# Patient Record
Sex: Female | Born: 1950 | Race: White | Hispanic: No | Smoking: Never smoker
Health system: Southern US, Community
[De-identification: ages and names within clinical notes are randomized; demographics above are authoritative.]

## PROBLEM LIST (undated history)

## (undated) DIAGNOSIS — M1990 Unspecified osteoarthritis, unspecified site: Secondary | ICD-10-CM

## (undated) DIAGNOSIS — E785 Hyperlipidemia, unspecified: Secondary | ICD-10-CM

## (undated) DIAGNOSIS — F32A Depression, unspecified: Secondary | ICD-10-CM

## (undated) DIAGNOSIS — I10 Essential (primary) hypertension: Secondary | ICD-10-CM

## (undated) DIAGNOSIS — R0600 Dyspnea, unspecified: Secondary | ICD-10-CM

## (undated) DIAGNOSIS — R519 Headache, unspecified: Secondary | ICD-10-CM

## (undated) DIAGNOSIS — I4891 Unspecified atrial fibrillation: Secondary | ICD-10-CM

## (undated) DIAGNOSIS — F329 Major depressive disorder, single episode, unspecified: Secondary | ICD-10-CM

## (undated) DIAGNOSIS — E119 Type 2 diabetes mellitus without complications: Secondary | ICD-10-CM

## (undated) DIAGNOSIS — I519 Heart disease, unspecified: Secondary | ICD-10-CM

## (undated) HISTORY — DX: Unspecified osteoarthritis, unspecified site: M19.90

## (undated) HISTORY — DX: Essential (primary) hypertension: I10

## (undated) HISTORY — DX: Heart disease, unspecified: I51.9

## (undated) HISTORY — DX: Major depressive disorder, single episode, unspecified: F32.9

## (undated) HISTORY — DX: Depression, unspecified: F32.A

## (undated) HISTORY — DX: Hyperlipidemia, unspecified: E78.5

---

## 1998-04-29 ENCOUNTER — Other Ambulatory Visit: Admission: RE | Admit: 1998-04-29 | Discharge: 1998-04-29 | Payer: Self-pay | Admitting: Family Medicine

## 2000-05-16 ENCOUNTER — Other Ambulatory Visit: Admission: RE | Admit: 2000-05-16 | Discharge: 2000-05-16 | Payer: Self-pay | Admitting: Family Medicine

## 2001-02-21 ENCOUNTER — Encounter: Payer: Self-pay | Admitting: Emergency Medicine

## 2001-02-21 ENCOUNTER — Observation Stay (HOSPITAL_COMMUNITY): Admission: EM | Admit: 2001-02-21 | Discharge: 2001-02-22 | Payer: Self-pay | Admitting: Emergency Medicine

## 2001-02-22 ENCOUNTER — Encounter: Payer: Self-pay | Admitting: Internal Medicine

## 2001-06-11 ENCOUNTER — Other Ambulatory Visit: Admission: RE | Admit: 2001-06-11 | Discharge: 2001-06-11 | Payer: Self-pay | Admitting: Family Medicine

## 2002-08-06 ENCOUNTER — Other Ambulatory Visit: Admission: RE | Admit: 2002-08-06 | Discharge: 2002-08-06 | Payer: Self-pay | Admitting: Family Medicine

## 2003-08-07 ENCOUNTER — Other Ambulatory Visit: Admission: RE | Admit: 2003-08-07 | Discharge: 2003-08-07 | Payer: Self-pay | Admitting: Family Medicine

## 2004-10-21 ENCOUNTER — Other Ambulatory Visit: Admission: RE | Admit: 2004-10-21 | Discharge: 2004-10-21 | Payer: Self-pay | Admitting: Family Medicine

## 2006-03-02 ENCOUNTER — Other Ambulatory Visit: Admission: RE | Admit: 2006-03-02 | Discharge: 2006-03-02 | Payer: Self-pay | Admitting: Family Medicine

## 2009-06-09 LAB — HM PAP SMEAR: HM Pap smear: NORMAL

## 2009-07-22 ENCOUNTER — Ambulatory Visit
Admission: RE | Admit: 2009-07-22 | Discharge: 2009-07-22 | Payer: Self-pay | Source: Home / Self Care | Admitting: Orthopedic Surgery

## 2010-03-22 LAB — HM COLONOSCOPY

## 2010-03-30 LAB — HEMOGLOBIN A1C: Hgb A1c MFr Bld: 8.9 % — AB (ref 4.0–6.0)

## 2010-04-25 LAB — POCT I-STAT 4, (NA,K, GLUC, HGB,HCT)
Glucose, Bld: 156 mg/dL — ABNORMAL HIGH (ref 70–99)
HCT: 42 % (ref 36.0–46.0)
Hemoglobin: 14.3 g/dL (ref 12.0–15.0)
Potassium: 3.9 mEq/L (ref 3.5–5.1)
Sodium: 142 mEq/L (ref 135–145)

## 2010-05-31 ENCOUNTER — Encounter: Payer: Self-pay | Admitting: Nurse Practitioner

## 2010-05-31 DIAGNOSIS — E785 Hyperlipidemia, unspecified: Secondary | ICD-10-CM | POA: Insufficient documentation

## 2010-05-31 DIAGNOSIS — R600 Localized edema: Secondary | ICD-10-CM | POA: Insufficient documentation

## 2010-05-31 DIAGNOSIS — E1169 Type 2 diabetes mellitus with other specified complication: Secondary | ICD-10-CM | POA: Insufficient documentation

## 2010-05-31 DIAGNOSIS — E1159 Type 2 diabetes mellitus with other circulatory complications: Secondary | ICD-10-CM | POA: Insufficient documentation

## 2010-05-31 DIAGNOSIS — R609 Edema, unspecified: Secondary | ICD-10-CM | POA: Insufficient documentation

## 2010-05-31 DIAGNOSIS — E119 Type 2 diabetes mellitus without complications: Secondary | ICD-10-CM | POA: Insufficient documentation

## 2010-05-31 DIAGNOSIS — I10 Essential (primary) hypertension: Secondary | ICD-10-CM | POA: Insufficient documentation

## 2010-06-25 NOTE — H&P (Signed)
[place]. [facility]  Patient:    Ashley [name], Ashley [name] Visit Number: [id number] MRN: [id number]          Service Type: Attending:  Doylene Canning. Ladona Ridgel, M.D. [facility] Dictated by:   Doylene Canning. Ladona Ridgel, M.D. [facility]. Date:  02/21/01   CC:         Dr. Reola Calkins, [facility]   History and Physical  ADMISSION DIAGNOSIS:  Chest pain.  HISTORY OF PRESENT ILLNESS:  The patient is a very pleasant, 60 year old, obese woman with a history of hypertension for about 10-15 years.  She was in her usual state of health until today when she developed substernal chest pain radiating into the left arm and neck.  There was no associated diaphoresis, nausea, or vomiting.  There were no palpitations.  The pain came and went.  It was not associated with activity.  She did note some minimal belching.  She did try taking Rolaids without much in the way of relief.  The patient denies any fevers or chills.  She denies any epigastric or right upper quadrant pain.  PAST MEDICAL HISTORY:  Notable for obesity and hypertension.  MEDICATIONS: 1. Lopressor 100 mg two tablets in the morning and one tablet in the evening. 2. Accuretic 20/12.5 mg p.o. q.d. 3. Oral contraception pills.  ALLERGIES:  She gives no history of allergies.  SOCIAL HISTORY:  The patient is married.  She works as a Surveyor, mining. She denies tobacco abuse.  FAMILY HISTORY:  Notable for a mother who is alive with diabetes and hypertension.  Her father died at age 12 of myocardial infarction.  REVIEW OF SYSTEMS:  Notable for no fevers, chills, or night sweats.  She denies any headache and vision or hearing changes.  She denied any skin rashes or lesions.  She denied any orthopnea, PND, palpitations, syncope, cough, or wheezing.  She denied any urinary frequency or dysuria.  She denies any nausea, vomiting, diarrhea, or abdominal pain.  She denies any joint pain. She denies any heat or cold  intolerance.  PHYSICAL EXAMINATION:  She is a pleasant, well-appearing, middle-aged, obese woman in no acute distress.  VITAL SIGNS:  The blood pressure was 130/80, pulse 66 and regular, and respirations 12.  HEENT:  Normocephalic and atraumatic.  The pupils were equal and round.  The oropharynx was moist.  There were no exudates.  NECK:  No jugular venous distention.  The carotids were 2+ and symmetric.  The thyroid was not appreciably enlarged.  There was no lymphadenopathy.  CARDIOVASCULAR:  Regular rate and rhythm with normal S1 and S2.  I did not appreciate an S3 or an S4 gallop.  LUNGS:  Clear bilaterally to auscultation.  There were no wheezes.  SKIN:  No rashes or lesions.  ABDOMEN:  Obese, nontender, and nondistended.  There was no obvious organomegaly.  EXTREMITIES:  No clubbing, cyanosis, or edema.  There were no rashes or petechial lesions.  NEUROLOGIC:  She was alert and oriented x 3.  Her cranial nerves were intact.  LABORATORY DATA:  The EKG demonstrates normal sinus rhythm with sinus arrhythmia.  There were no ST-T wave changes.  IMPRESSION: 1. Substernal chest pain. 2. Hypertension. 3. Obesity.  DISCUSSION:  The pain has both typical and atypical features for angina pectoris.  For now we will treat her as if this is angina and given her nitroglycerin, heparin, beta blockers, and aspirin.  Will continue her on her home medications for hypertension.  If her  cardiac enzymes are abnormal, then I would recommend left heart catheterization.  If her enzymes are negative, then I would recommend exercise Cardiolite stress testing. Dictated by:   Doylene Canning. Ladona Ridgel, M.D. [facility] Attending:  Doylene Canning. Ladona Ridgel, M.D. [facility] DD:  02/21/01 TD:  02/21/01 Job: [id number]

## 2010-06-25 NOTE — Discharge Summary (Signed)
[place]. [facility]  Patient:    MANJIT, BUFANO Visit Number: [id number] MRN: [id number]          Service Type: MED Location: [phone] Attending Physician:  Lewayne Bunting Dictated by:   Brita Romp, P.A.-C Admit Date:  02/21/2001 Discharge Date: 02/22/2001   CC:         Dr. Reola Calkins at [facility]                           Discharge Summary  DISCHARGE DIAGNOSES: 1. Chest pain, status post nuclear stress test. 2. Hypertension.  HOSPITAL COURSE:  Ms. Laguna is a 60 year old female with known hypertension. On the day of admission, she developed substernal chest pain radiating to the left arm and neck.  She was seen and admitted by Dr. Lewayne Bunting.  Dr. Ladona Ridgel felt that her pain had both typical and atypical features for angina pectoris. Planned to cycle cardiac enzymes, and if they were negative obtain a stress test.  The next day, the patient was seen by Dr. Olga Millers.  He noted the patient had no further chest pain or shortness of breath, and serial cardiac enzymes had been negative.  He felt that the patient could be discharge if her Cardiolite was negative.  Later that day, the patient underwent an exercise Cardiolite stress test. Nuclear imaging revealed no signs of ischemia, and an ejection fraction of approximately 77%.  As a result, the patient was felt to be stable for discharge.  DISCHARGE MEDICATIONS: 1. Lopressor 50 mg b.i.d. 2. Enteric-coated aspirin 325 mg q.d. 3. Accuretic 20/12.5 q.d. 4. Birth control as previously taken. 5. Protonix 40 mg p.o. q.d.  LABORATORY DATA:  Sodium 138, potassium 3.8, chloride 104, CO2 28, BUN 11, creatinine 0.6, glucose 94.  Total protein 7.1, albumin 3.3, SGOT 22, SGPT 17, alkaline phosphatase 52, total bilirubin 0.5.  White count 12.7, hemoglobin 13.8, hematocrit 40.0, platelets 300.  Electrocardiogram showed sinus rhythm at 78.  PR interval 156, QRS 84, QTC 417, axis  6. Dictated by:   Brita Romp, P.A.-C Attending Physician:  Lewayne Bunting DD:  02/22/01 TD:  02/25/01 Job: [id number]

## 2012-05-03 ENCOUNTER — Other Ambulatory Visit: Payer: Self-pay | Admitting: *Deleted

## 2012-05-03 ENCOUNTER — Encounter: Payer: Self-pay | Admitting: *Deleted

## 2012-05-03 DIAGNOSIS — M79609 Pain in unspecified limb: Secondary | ICD-10-CM

## 2012-05-03 DIAGNOSIS — Z780 Asymptomatic menopausal state: Secondary | ICD-10-CM

## 2012-07-10 ENCOUNTER — Telehealth: Payer: Self-pay | Admitting: Nurse Practitioner

## 2012-07-10 NOTE — Telephone Encounter (Signed)
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 Pt states that she was just here in March and wants to drop off form and see if we can fill out. Form has to be mailed back within 30 days. Advised patient to drop off form as soon as she can and we would look at. If needs to be seen will contact her. Patient states that she will drop off in AM

## 2012-07-18 ENCOUNTER — Ambulatory Visit: Payer: Self-pay

## 2012-07-18 ENCOUNTER — Other Ambulatory Visit: Payer: Self-pay

## 2012-07-26 ENCOUNTER — Encounter: Payer: Self-pay | Admitting: Nurse Practitioner

## 2012-07-26 ENCOUNTER — Ambulatory Visit (INDEPENDENT_AMBULATORY_CARE_PROVIDER_SITE_OTHER): Payer: PRIVATE HEALTH INSURANCE | Admitting: Nurse Practitioner

## 2012-07-26 VITALS — BP 155/70 | HR 58 | Temp 97.6°F | Ht 64.0 in | Wt 281.0 lb

## 2012-07-26 DIAGNOSIS — L259 Unspecified contact dermatitis, unspecified cause: Secondary | ICD-10-CM

## 2012-07-26 MED ORDER — METHYLPREDNISOLONE ACETATE 80 MG/ML IJ SUSP
80.0000 mg | Freq: Once | INTRAMUSCULAR | Status: AC
  Administered 2012-07-26: 80 mg via INTRAMUSCULAR

## 2012-07-26 NOTE — Progress Notes (Signed)
  Subjective:    Patient ID: Ashley [name], female    DOB: 16-May-1950, 62 y.o.   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 Pt also states she has poison oak on bilateral upper extremities from gardening two days ago. No other complaints at this time.  2. Patient got into some poison oak doing yard work and it is spreading and itching.    Review of Systems  Skin: Positive for rash.  All other systems reviewed and are negative.       Objective:   Physical Exam  Constitutional: She is oriented to person, place, and time. She appears well-developed and well-nourished.  HENT:  Nose: Nose normal.  Mouth/Throat: Oropharynx is clear and moist.  Eyes: EOM are normal.  Neck: Trachea normal, normal range of motion and full passive range of motion without pain. Neck supple. No JVD present. Carotid bruit is not present. No thyromegaly present.  Cardiovascular: Normal rate, regular rhythm, normal heart sounds and intact distal pulses.  Exam reveals no gallop and no friction rub.   No murmur heard. Pulmonary/Chest: Effort normal and breath sounds normal.  Abdominal: Soft. Bowel sounds are normal. She exhibits no distension and no mass. There is no tenderness.  Musculoskeletal: Normal range of motion.  Lymphadenopathy:    She has no cervical adenopathy.  Neurological: She is alert and oriented to person, place, and time. She has normal reflexes.  Skin: Skin is warm and dry.  Erythematous vesicular lesins in linear  Pattern bil forearms.  Psychiatric: She has a normal mood and affect. Her behavior is normal. Judgment and thought content normal.   BP 155/70  Pulse 58  Temp(Src) 97.6 F (36.4 C) (Oral)  Ht 5\' 4"  (1.626 m)  Wt 281 lb (127.461 kg)  BMI 48.21 kg/m2        Assessment & Plan:  1. Contact dermatitis Avoid scratching Cool compresses Calamine lotion if helps Watch blood sugars for the next several days because may go up due to  depomedrol injection - methylPREDNISolone acetate (DEPO-MEDROL) injection 80 mg; Inject 1 mL (80 mg total) into the muscle once.  2. DMV forms filled out  Mary-Margaret [name], FNP

## 2012-07-26 NOTE — Patient Instructions (Signed)

## 2012-08-02 ENCOUNTER — Telehealth: Payer: Self-pay | Admitting: Nurse Practitioner

## 2012-09-13 ENCOUNTER — Other Ambulatory Visit: Payer: Self-pay | Admitting: Nurse Practitioner

## 2012-09-15 ENCOUNTER — Other Ambulatory Visit: Payer: Self-pay | Admitting: Nurse Practitioner

## 2012-10-11 ENCOUNTER — Other Ambulatory Visit: Payer: Self-pay

## 2012-10-11 MED ORDER — DILTIAZEM HCL ER COATED BEADS 120 MG PO CP24: 120.0000 mg | ORAL_CAPSULE | Freq: Every day | ORAL | Status: DC

## 2012-10-11 NOTE — Telephone Encounter (Signed)
Last seen 07/26/12  MMM  Last glucose 04/22/12

## 2012-10-16 ENCOUNTER — Other Ambulatory Visit: Payer: Self-pay

## 2012-10-16 NOTE — Telephone Encounter (Signed)
Last glucose 04/12/12   MMM

## 2012-10-17 ENCOUNTER — Telehealth: Payer: Self-pay | Admitting: Nurse Practitioner

## 2012-10-17 MED ORDER — METFORMIN HCL 1000 MG PO TABS: 1000.0000 mg | ORAL_TABLET | Freq: Two times a day (BID) | ORAL | Status: DC

## 2012-10-18 MED ORDER — SIMVASTATIN 40 MG PO TABS: 40.0000 mg | ORAL_TABLET | Freq: Every evening | ORAL | Status: DC

## 2012-10-18 NOTE — Telephone Encounter (Signed)
done

## 2012-10-31 ENCOUNTER — Ambulatory Visit: Payer: PRIVATE HEALTH INSURANCE | Admitting: Nurse Practitioner

## 2012-10-31 ENCOUNTER — Ambulatory Visit (INDEPENDENT_AMBULATORY_CARE_PROVIDER_SITE_OTHER): Payer: PRIVATE HEALTH INSURANCE | Admitting: Nurse Practitioner

## 2012-10-31 ENCOUNTER — Encounter: Payer: Self-pay | Admitting: Nurse Practitioner

## 2012-10-31 VITALS — BP 174/78 | HR 58 | Temp 97.4°F | Ht 64.0 in | Wt 271.0 lb

## 2012-10-31 DIAGNOSIS — I10 Essential (primary) hypertension: Secondary | ICD-10-CM

## 2012-10-31 DIAGNOSIS — E785 Hyperlipidemia, unspecified: Secondary | ICD-10-CM

## 2012-10-31 DIAGNOSIS — R600 Localized edema: Secondary | ICD-10-CM

## 2012-10-31 DIAGNOSIS — E1149 Type 2 diabetes mellitus with other diabetic neurological complication: Secondary | ICD-10-CM

## 2012-10-31 DIAGNOSIS — R609 Edema, unspecified: Secondary | ICD-10-CM

## 2012-10-31 DIAGNOSIS — Z23 Encounter for immunization: Secondary | ICD-10-CM

## 2012-10-31 LAB — POCT GLYCOSYLATED HEMOGLOBIN (HGB A1C): Hemoglobin A1C: 6

## 2012-10-31 LAB — POCT UA - MICROALBUMIN: Microalbumin Ur, POC: NEGATIVE mg/L

## 2012-10-31 MED ORDER — METFORMIN HCL 1000 MG PO TABS: 1000.0000 mg | ORAL_TABLET | Freq: Two times a day (BID) | ORAL | Status: DC

## 2012-10-31 MED ORDER — DILTIAZEM HCL ER COATED BEADS 120 MG PO CP24: 120.0000 mg | ORAL_CAPSULE | Freq: Every day | ORAL | Status: DC

## 2012-10-31 MED ORDER — QUINAPRIL HCL 40 MG PO TABS: 40.0000 mg | ORAL_TABLET | Freq: Every day | ORAL | Status: DC

## 2012-10-31 MED ORDER — METOPROLOL TARTRATE 100 MG PO TABS: ORAL_TABLET | ORAL | Status: DC

## 2012-10-31 MED ORDER — SIMVASTATIN 40 MG PO TABS: 40.0000 mg | ORAL_TABLET | Freq: Every evening | ORAL | Status: DC

## 2012-10-31 MED ORDER — FUROSEMIDE 20 MG PO TABS: 20.0000 mg | ORAL_TABLET | Freq: Every day | ORAL | Status: DC

## 2012-10-31 NOTE — Patient Instructions (Addendum)
H1N1 Influenza (swine flu) Vaccine injection What is this medicine? H1N1 INFLUENZA (SWINE FLU) VACCINE (H1N1 in floo EN zuh (swahyn floo) vak SEEN) is a vaccine to protect from an infection with the pandemic H1N1 flu, also known as the swine flu. The vaccine only helps protect you against this one strain of the flu. This vaccine does not help to the reduce the risk of getting other types of flu. You may also need to get the seasonal influenza virus vaccine. This medicine may be used for other purposes; ask your health care provider or pharmacist if you have questions. What should I tell my health care provider before I take this medicine? They need to know if you have any of these conditions: -Guillain-Barre syndrome -immune system problems -an unusual or allergic reaction to influenza vaccine, eggs, neomycin, polymyxin, other medicines, foods, dyes or preservatives -pregnant or trying to get pregnant -breast-feeding How should I use this medicine? This vaccine is for injection into a muscle. It is given by a health care professional. A copy of Vaccine Information Statements will be given before each vaccination. Read this sheet carefully each time. The sheet may change frequently. Talk to your pediatrician regarding the use of this medicine in children. Special care may be needed. While this drug may be prescribed for children as young as 6 months for selected conditions, precautions do apply. Overdosage: If you think you've taken too much of this medicine contact a poison control center or emergency room at once. Overdosage: If you think you have taken too much of this medicine contact a poison control center or emergency room at once. NOTE: This medicine is only for you. Do not share this medicine with others. What if I miss a dose? If needed, keep appointments for follow-up (booster) doses as directed. It is important not to miss your dose. Call your doctor or health care professional if you  are unable to keep an appointment. What may interact with this medicine? -anakinra -medicines for organ transplant -medicines to treat cancer -other vaccines -rilonacept -steroid medicines like prednisone or cortisone -tumor necrosis factor (TNF) modifiers like adalimumab, etanercept, infliximab, golimumab, or certolizumab This list may not describe all possible interactions. Give your health care provider a list of all the medicines, herbs, non-prescription drugs, or dietary supplements you use. Also tell them if you smoke, drink alcohol, or use illegal drugs. Some items may interact with your medicine. What should I watch for while using this medicine? Report any side effects to your doctor right away. This vaccine lowers your risk of getting the pandemic H1N1 flu. You can get a milder H1N1 flu infection if you are around others with this flu. This flu vaccine will not protect against colds or other illnesses including other flu viruses. You may also need the seasonal influenza vaccine. What side effects may I notice from receiving this medicine? Side effects that you should report to your doctor or health care professional as soon as possible: -allergic reactions like skin rash, itching or hives, swelling of the face, lips, or tongue -breathing problems -muscle weakness -unusual drooping or paralysis of face Side effects that usually do not require medical attention (Report these to your doctor or health care professional if they continue or are bothersome.): -chills -cough -headache -muscle aches and pains -runny or stuffy nose -sore throat -stomach upset -tiredness This list may not describe all possible side effects. Call your doctor for medical advice about side effects. You may report side effects to FDA  at 1-800-FDA-1088. Where should I keep my medicine? This vaccine is only given in a clinic, pharmacy, doctor's office, or other health care setting and will not be stored at  home. NOTE: This sheet is a summary. It may not cover all possible information. If you have questions about this medicine, talk to your doctor, pharmacist, or health care provider.  2012, Elsevier/Gold Standard. (12/25/2007 4:49:51 PM)

## 2012-10-31 NOTE — Progress Notes (Signed)
Subjective:    Patient ID: Ashley [name], female    DOB: [birth year], 62 y.o.   MRN: [id number]  Hypertension This is a chronic problem. The current episode started more than 1 year ago. The problem has been waxing and waning since onset. The problem is uncontrolled. Associated symptoms include peripheral edema. Pertinent negatives include no anxiety, chest pain, headaches, neck pain, orthopnea, palpitations or shortness of breath. There are no associated agents to hypertension. Risk factors for coronary artery disease include dyslipidemia, family history, diabetes mellitus, obesity and post-menopausal state. Past treatments include ACE inhibitors, calcium channel blockers and diuretics.  Hyperlipidemia This is a chronic problem. The current episode started more than 1 year ago. The problem is uncontrolled. Recent lipid tests were reviewed and are high. Exacerbating diseases include diabetes and obesity. She has no history of hypothyroidism. There are no known factors aggravating her hyperlipidemia. Pertinent negatives include no chest pain or shortness of breath. Current antihyperlipidemic treatment includes statins. The current treatment provides mild improvement of lipids. Compliance problems include adherence to diet and adherence to exercise.  Risk factors for coronary artery disease include diabetes mellitus, family history, hypertension, obesity and post-menopausal.  Diabetes She presents for her follow-up diabetic visit. She has type 2 diabetes mellitus. No MedicAlert identification noted. The initial diagnosis of diabetes was made 1 year ago. Her disease course has been improving. Pertinent negatives for hypoglycemia include no headaches. Pertinent negatives for diabetes include no chest pain, no foot paresthesias, no polydipsia, no polyphagia, no polyuria, no weakness and no weight loss. Symptoms are improving. Risk factors for coronary artery disease include dyslipidemia, family history,  hypertension, obesity and post-menopausal. Current diabetic treatment includes oral agent (monotherapy) and diet. She is compliant with treatment most of the time. Her weight is stable. She is following a diabetic diet. When asked about meal planning, she reported none. She has not had a previous visit with a dietician. She rarely participates in exercise. There is no change in her home blood glucose trend. (Patient doesn't check blood sugars like she should) An ACE inhibitor/angiotensin II receptor blocker is being taken. She does not see a podiatrist.Eye exam is current.  peripheral edema Lasix works well to keep swelling under control.   Review of Systems  Constitutional: Negative for weight loss.  HENT: Negative for neck pain.   Respiratory: Negative for shortness of breath.   Cardiovascular: Negative for chest pain, palpitations and orthopnea.  Endocrine: Negative for polydipsia, polyphagia and polyuria.  Neurological: Negative for weakness and headaches.       Objective:   Physical Exam  Constitutional: She is oriented to person, place, and time. She appears well-developed and well-nourished.  HENT:  Nose: Nose normal.  Mouth/Throat: Oropharynx is clear and moist.  Eyes: EOM are normal.  Neck: Trachea normal, normal range of motion and full passive range of motion without pain. Neck supple. No JVD present. Carotid bruit is not present. No thyromegaly present.  Cardiovascular: Normal rate, regular rhythm, normal heart sounds and intact distal pulses.  Exam reveals no gallop and no friction rub.   No murmur heard. Pulmonary/Chest: Effort normal and breath sounds normal.  Abdominal: Soft. Bowel sounds are normal. She exhibits no distension and no mass. There is no tenderness.  Musculoskeletal: Normal range of motion.  Lymphadenopathy:    She has no cervical adenopathy.  Neurological: She is alert and oriented to person, place, and time. She has normal reflexes.  Skin: Skin is warm  and dry.  Psychiatric: She has  a normal mood and affect. Her behavior is normal. Judgment and thought content normal.   BP 174/78  Pulse 58  Temp(Src) 97.4 F (36.3 C) (Oral)  Ht 5\' 4"  (1.626 m)  Wt 271 lb (122.925 kg)  BMI 46.49 kg/m2 Results for orders placed in visit on 10/31/12  POCT GLYCOSYLATED HEMOGLOBIN (HGB A1C)      Result Value Range   Hemoglobin A1C 6.0%            Assessment & Plan:   1. Hyperlipidemia   2. Hypertension   3. 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 Peripheral edema    Orders Placed This Encounter  Procedures  . CMP14+EGFR  . NMR, lipoprofile  . POCT glycosylated hemoglobin (Hb A1C)  . POCT UA - Microalbumin   Meds ordered this encounter  Medications  . quinapril (ACCUPRIL) 40 MG tablet    Sig: Take 1 tablet (40 mg total) by mouth daily.    Dispense:  30 tablet    Refill:  5    Order Specific Question:  Supervising Provider    Answer:  Ernestina Penna [[id number]]  . metoprolol (LOPRESSOR) 100 MG tablet    Sig: 2 po qam and 1 po qhs    Dispense:  90 tablet    Refill:  5    Order Specific Question:  Supervising Provider    Answer:  Ernestina Penna [[id number]]  . simvastatin (ZOCOR) 40 MG tablet    Sig: Take 1 tablet (40 mg total) by mouth every evening.    Dispense:  30 tablet    Refill:  5    Order Specific Question:  Supervising Provider    Answer:  Ernestina Penna [[id number]]  . furosemide (LASIX) 20 MG tablet    Sig: Take 1 tablet (20 mg total) by mouth daily.    Dispense:  30 tablet    Refill:  5    Order Specific Question:  Supervising Provider    Answer:  Ernestina Penna [[id number]]  . diltiazem (CARDIZEM CD) 120 MG 24 hr capsule    Sig: Take 1 capsule (120 mg total) by mouth daily.    Dispense:  30 capsule    Refill:  5    Order Specific Question:  Supervising Provider    Answer:  Ernestina Penna [[id number]]  . metFORMIN (GLUCOPHAGE) 1000 MG tablet    Sig: Take 1  tablet (1,000 mg total) by mouth 2 (two) times daily.    Dispense:  60 tablet    Refill:  5    Order Specific Question:  Supervising Provider    Answer:  Deborra Medina    Continue all meds Labs pending Diet and exercise encouraged Health maintenance reviewed Follow up in 3 months   Ashley [name], FNP

## 2012-10-31 NOTE — Addendum Note (Signed)
Addended by: Bernita Buffy on: 10/31/2012 03:56 PM   Modules accepted: Orders

## 2012-11-02 LAB — CMP14+EGFR
AST: 37 IU/L (ref 0–40)
Albumin/Globulin Ratio: 1.5 (ref 1.1–2.5)
Albumin: 4.1 g/dL (ref 3.6–4.8)
Alkaline Phosphatase: 51 IU/L (ref 39–117)
BUN: 20 mg/dL (ref 8–27)
CO2: 27 mmol/L (ref 18–29)
Calcium: 9.5 mg/dL (ref 8.6–10.2)
Creatinine, Ser: 0.52 mg/dL — ABNORMAL LOW (ref 0.57–1.00)
GFR calc non Af Amer: 104 mL/min/{1.73_m2} (ref >59)
Globulin, Total: 2.7 g/dL (ref 1.5–4.5)
Glucose: 115 mg/dL — ABNORMAL HIGH (ref 65–99)
Sodium: 144 mmol/L (ref 134–144)
Total Protein: 6.8 g/dL (ref 6.0–8.5)

## 2012-11-02 LAB — NMR, LIPOPROFILE
Cholesterol: 129 mg/dL (ref <200)
HDL Cholesterol by NMR: 40 mg/dL (ref >=40)
HDL Particle Number: 31.2 umol/L (ref >=30.5)
LDLC SERPL CALC-MCNC: 57 mg/dL (ref <100)
LP-IR Score: 67 — ABNORMAL HIGH (ref <=45)
Small LDL Particle Number: 893 nmol/L — ABNORMAL HIGH (ref <=527)
Triglycerides by NMR: 161 mg/dL — ABNORMAL HIGH (ref <150)

## 2012-12-06 ENCOUNTER — Other Ambulatory Visit: Payer: Self-pay | Admitting: Nurse Practitioner

## 2012-12-06 DIAGNOSIS — R928 Other abnormal and inconclusive findings on diagnostic imaging of breast: Secondary | ICD-10-CM

## 2013-01-08 ENCOUNTER — Encounter (INDEPENDENT_AMBULATORY_CARE_PROVIDER_SITE_OTHER): Payer: Self-pay

## 2013-01-08 ENCOUNTER — Telehealth: Payer: Self-pay | Admitting: Nurse Practitioner

## 2013-01-08 ENCOUNTER — Ambulatory Visit (INDEPENDENT_AMBULATORY_CARE_PROVIDER_SITE_OTHER): Payer: PRIVATE HEALTH INSURANCE | Admitting: *Deleted

## 2013-01-08 DIAGNOSIS — Z23 Encounter for immunization: Secondary | ICD-10-CM

## 2013-01-08 NOTE — Patient Instructions (Signed)
Shingles Vaccine  What You Need to Know  WHAT IS SHINGLES?  · Shingles is a painful skin rash, often with blisters. It is also called Herpes Zoster or just Zoster.  · A shingles rash usually appears on one side of the face or body and lasts from 2 to 4 weeks. Its main symptom is pain, which can be quite severe. Other symptoms of shingles can include fever, headache, chills, and upset stomach. Very rarely, a shingles infection can lead to pneumonia, hearing problems, blindness, brain inflammation (encephalitis), or death.  · For about 1 person in 5, severe pain can continue even after the rash clears up. This is called post-herpetic neuralgia.  · Shingles is caused by the Varicella Zoster virus. This is the same virus that causes chickenpox. Only someone who has had a case of chickenpox or rarely, has gotten chickenpox vaccine, can get shingles. The virus stays in your body. It can reappear many years later to cause a case of shingles.  · You cannot catch shingles from another person with shingles. However, a person who has never had chickenpox (or chickenpox vaccine) could get chickenpox from someone with shingles. This is not very common.  · Shingles is far more common in people 50 and older than in younger people. It is also more common in people whose immune systems are weakened because of a disease such as cancer or drugs such as steroids or chemotherapy.  · At least 1 million people get shingles per year in the [place].  SHINGLES VACCINE  · A vaccine for shingles was licensed in 2006. In clinical trials, the vaccine reduced the risk of shingles by 50%. It can also reduce the pain in people who still get shingles after being vaccinated.  · A single dose of shingles vaccine is recommended for adults 60 years of age and older.  SOME PEOPLE SHOULD NOT GET SHINGLES VACCINE OR SHOULD WAIT  A person should not get shingles vaccine if he or she:  · Has ever had a life-threatening allergic reaction to gelatin, the  antibiotic neomycin, or any other component of shingles vaccine. Tell your caregiver if you have any severe allergies.  · Has a weakened immune system because of current:  · AIDS or another disease that affects the immune system.  · Treatment with drugs that affect the immune system, such as prolonged use of high-dose steroids.  · Cancer treatment, such as radiation or chemotherapy.  · Cancer affecting the bone marrow or lymphatic system, such as leukemia or lymphoma.  · Is pregnant, or might be pregnant. Women should not become pregnant until at least 4 weeks after getting shingles vaccine.  Someone with a minor illness, such as a cold, may be vaccinated. Anyone with a moderate or severe acute illness should usually wait until he or she recovers before getting the vaccine. This includes anyone with a temperature of 101.3° F (38° C) or higher.  WHAT ARE THE RISKS FROM SHINGLES VACCINE?  · A vaccine, like any medicine, could possibly cause serious problems, such as severe allergic reactions. However, the risk of a vaccine causing serious harm, or death, is extremely small.  · No serious problems have been identified with shingles vaccine.  Mild Problems  · Redness, soreness, swelling, or itching at the site of the injection (about 1 person in 3).  · Headache (about 1 person in 70).  Like all vaccines, shingles vaccine is being closely monitored for unusual or severe problems.  WHAT IF   THERE IS A MODERATE OR SEVERE REACTION?  What should I look for?  Any unusual condition, such as a severe allergic reaction or a high fever. If a severe allergic reaction occurred, it would be within a few minutes to an hour after the shot. Signs of a serious allergic reaction can include difficulty breathing, weakness, hoarseness or wheezing, a fast heartbeat, hives, dizziness, paleness, or swelling of the throat.  What should I do?  · Call your caregiver, or get the person to a caregiver right away.  · Tell the caregiver what  happened, the date and time it happened, and when the vaccination was given.  · Ask the caregiver to report the reaction by filing a Vaccine Adverse Event Reporting System (VAERS) form. Or, you can file this report through the VAERS web site at www.vaers.hhs.gov or by calling 1-800-822-7967.  VAERS does not provide medical advice.  HOW CAN I LEARN MORE?  · Ask your caregiver. He or she can give you the vaccine package insert or suggest other sources of information.  · Contact the Centers for Disease Control and Prevention (CDC):  · Call 1-800-232-4636 (1-800-CDC-INFO).  · Visit the CDC website at www.cdc.gov/vaccines  CDC Shingles Vaccine VIS (11/13/07)  Document Released: 11/21/2005 Document Revised: 04/18/2011 Document Reviewed: 05/16/2012  Exit[employer]® Patient Information ©2014 [employer].

## 2013-01-08 NOTE — Progress Notes (Signed)
Patient ID: Ashley [name], female   DOB: [birth year], 62 y.o.   MRN: [id number] Pt tolerated inj well

## 2013-02-13 ENCOUNTER — Ambulatory Visit: Payer: PRIVATE HEALTH INSURANCE | Admitting: Nurse Practitioner

## 2013-06-05 ENCOUNTER — Other Ambulatory Visit: Payer: Self-pay | Admitting: Nurse Practitioner

## 2013-06-06 NOTE — Telephone Encounter (Signed)
Patient NTBS for follow up and lab work  

## 2013-07-04 ENCOUNTER — Other Ambulatory Visit: Payer: Self-pay | Admitting: Nurse Practitioner

## 2013-07-05 NOTE — Telephone Encounter (Signed)
Last seen and last glucose 10/31/12  MMM

## 2013-07-05 NOTE — Telephone Encounter (Signed)
Patient NTBS for follow up and lab work  

## 2013-07-11 LAB — HM DIABETES EYE EXAM

## 2013-08-02 ENCOUNTER — Other Ambulatory Visit: Payer: Self-pay | Admitting: Nurse Practitioner

## 2013-08-05 NOTE — Telephone Encounter (Signed)
Patient last seen in office on 10-31-12. Please advise on refills.

## 2013-08-08 ENCOUNTER — Encounter: Payer: Self-pay | Admitting: Nurse Practitioner

## 2013-08-08 ENCOUNTER — Encounter (INDEPENDENT_AMBULATORY_CARE_PROVIDER_SITE_OTHER): Payer: Self-pay

## 2013-08-08 ENCOUNTER — Ambulatory Visit (INDEPENDENT_AMBULATORY_CARE_PROVIDER_SITE_OTHER): Payer: 59 | Admitting: Nurse Practitioner

## 2013-08-08 VITALS — BP 142/86 | HR 72 | Temp 98.1°F | Ht 64.0 in | Wt 282.0 lb

## 2013-08-08 DIAGNOSIS — I10 Essential (primary) hypertension: Secondary | ICD-10-CM

## 2013-08-08 DIAGNOSIS — E1149 Type 2 diabetes mellitus with other diabetic neurological complication: Secondary | ICD-10-CM

## 2013-08-08 DIAGNOSIS — E785 Hyperlipidemia, unspecified: Secondary | ICD-10-CM

## 2013-08-08 DIAGNOSIS — R609 Edema, unspecified: Secondary | ICD-10-CM

## 2013-08-08 LAB — POCT GLYCOSYLATED HEMOGLOBIN (HGB A1C): Hemoglobin A1C: 6.4

## 2013-08-08 MED ORDER — DILTIAZEM HCL ER COATED BEADS 120 MG PO CP24: ORAL_CAPSULE | ORAL | Status: DC

## 2013-08-08 MED ORDER — QUINAPRIL HCL 40 MG PO TABS: ORAL_TABLET | ORAL | Status: DC

## 2013-08-08 MED ORDER — SIMVASTATIN 40 MG PO TABS: 40.0000 mg | ORAL_TABLET | Freq: Every evening | ORAL | Status: DC

## 2013-08-08 MED ORDER — METFORMIN HCL ER (MOD) 1000 MG PO TB24: 1000.0000 mg | ORAL_TABLET | Freq: Two times a day (BID) | ORAL | Status: DC

## 2013-08-08 MED ORDER — FUROSEMIDE 20 MG PO TABS: 20.0000 mg | ORAL_TABLET | Freq: Every day | ORAL | Status: DC

## 2013-08-08 MED ORDER — METOPROLOL TARTRATE 100 MG PO TABS: ORAL_TABLET | ORAL | Status: DC

## 2013-08-08 NOTE — Progress Notes (Signed)
Subjective:    Patient ID: Ashley [name], female    DOB: 1950/07/19, 63 y.o.   MRN: [id number]  Patient is here today for chronic disease follow up. No complaint.   Hypertension This is a chronic problem. The current episode started more than 1 year ago. The problem has been waxing and waning since onset. The problem is uncontrolled. Associated symptoms include peripheral edema. Pertinent negatives include no anxiety, chest pain, headaches, neck pain, orthopnea, palpitations or shortness of breath. There are no associated agents to hypertension. Risk factors for coronary artery disease include dyslipidemia, family history, diabetes mellitus, obesity and post-menopausal state. Past treatments include ACE inhibitors, calcium channel blockers and diuretics.  Hyperlipidemia This is a chronic problem. The current episode started more than 1 year ago. The problem is uncontrolled. Recent lipid tests were reviewed and are high. Exacerbating diseases include diabetes and obesity. She has no history of hypothyroidism. There are no known factors aggravating her hyperlipidemia. Pertinent negatives include no chest pain or shortness of breath. Current antihyperlipidemic treatment includes statins. The current treatment provides mild improvement of lipids. Compliance problems include adherence to diet and adherence to exercise.  Risk factors for coronary artery disease include diabetes mellitus, family history, hypertension, obesity and post-menopausal.  Diabetes She presents for her follow-up diabetic visit. She has type 2 diabetes mellitus. No MedicAlert identification noted. The initial diagnosis of diabetes was made 1 year ago. Her disease course has been improving. Pertinent negatives for hypoglycemia include no headaches. Pertinent negatives for diabetes include no chest pain, no foot paresthesias, no polydipsia, no polyphagia, no polyuria, no weakness and no weight loss. Symptoms are improving. Risk factors for  coronary artery disease include dyslipidemia, family history, hypertension, obesity and post-menopausal. Current diabetic treatment includes oral agent (monotherapy) and diet. She is compliant with treatment most of the time. Her weight is stable. She is following a diabetic diet. When asked about meal planning, she reported none. She has not had a previous visit with a dietician. She rarely participates in exercise. There is no change in her home blood glucose trend. (Patient doesn't check blood sugars like she should) An ACE inhibitor/angiotensin II receptor blocker is being taken. She does not see a podiatrist.Eye exam is current.  peripheral edema Lasix works well to keep swelling under control. She only takes it on the days she feels like she is retaining fluid.    Review of Systems  Constitutional: Negative for weight loss.  Respiratory: Negative for shortness of breath.   Cardiovascular: Negative for chest pain, palpitations and orthopnea.  Endocrine: Negative for polydipsia, polyphagia and polyuria.  Musculoskeletal: Negative for neck pain.  Neurological: Negative for weakness and headaches.       Objective:   Physical Exam  Constitutional: She is oriented to person, place, and time. She appears well-developed and well-nourished.  HENT:  Nose: Nose normal.  Mouth/Throat: Oropharynx is clear and moist.  Eyes: EOM are normal.  Neck: Trachea normal, normal range of motion and full passive range of motion without pain. Neck supple. No JVD present. Carotid bruit is not present. No thyromegaly present.  Cardiovascular: Normal rate, regular rhythm, normal heart sounds and intact distal pulses.  Exam reveals no gallop and no friction rub.   No murmur heard. Pulmonary/Chest: Effort normal and breath sounds normal.  Abdominal: Soft. Bowel sounds are normal. She exhibits no distension and no mass. There is no tenderness.  Musculoskeletal: Normal range of motion.  Lymphadenopathy:    She has  no cervical adenopathy.  Neurological: She is alert and oriented to person, place, and time. She has normal reflexes.  Skin: Skin is warm and dry.  Psychiatric: She has a normal mood and affect. Her behavior is normal. Judgment and thought content normal.   BP 142/86  Pulse 72  Temp(Src) 98.1 F (36.7 C) (Oral)  Ht _0  (1.626 m)  Wt 282 lb (127.914 kg)  BMI 48.38 kg/m2   Results for orders placed in visit on 08/08/13  POCT GLYCOSYLATED HEMOGLOBIN (HGB A1C)      Result Value Ref Range   Hemoglobin A1C 6.4%           Assessment & Plan:   1. Hyperlipidemia   2. Essential hypertension   3. 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 Peripheral edema    Orders Placed This Encounter  Procedures  . CMP14+EGFR  . NMR, lipoprofile  . POCT glycosylated hemoglobin (Hb A1C)   Meds ordered this encounter  Medications  . simvastatin (ZOCOR) 40 MG tablet    Sig: Take 1 tablet (40 mg total) by mouth every evening.    Dispense:  30 tablet    Refill:  5    Order Specific Question:  Supervising Provider    Answer:  Chipper Herb [[id number]]  . quinapril (ACCUPRIL) 40 MG tablet    Sig: TAKE ONE TABLET BY MOUTH ONE TIME DAILY    Dispense:  30 tablet    Refill:  5    Order Specific Question:  Supervising Provider    Answer:  Chipper Herb [[id number]]  . metoprolol (LOPRESSOR) 100 MG tablet    Sig: TAKE TWO TABLETS BY MOUTH IN THE MORNING AND ONE AT BEDTIME    Dispense:  90 tablet    Refill:  5    Order Specific Question:  Supervising Provider    Answer:  Chipper Herb [[id number]]  . furosemide (LASIX) 20 MG tablet    Sig: Take 1 tablet (20 mg total) by mouth daily.    Dispense:  30 tablet    Refill:  5    Order Specific Question:  Supervising Provider    Answer:  Chipper Herb [[id number]]  . diltiazem (CARDIZEM CD) 120 MG 24 hr capsule    Sig: TAKE ONE CAPSULE BY MOUTH ONE TIME DAILY    Dispense:  30 capsule     Refill:  5    Order Specific Question:  Supervising Provider    Answer:  Chipper Herb [[id number]]  . metFORMIN (GLUMETZA) 1000 MG (MOD) 24 hr tablet    Sig: Take 1 tablet (1,000 mg total) by mouth 2 (two) times daily with a meal.    Dispense:  60 tablet    Refill:  5    Order Specific Question:  Supervising Provider    Answer:  Chipper Herb [[id number]]    Labs pending Health maintenance reviewed Diet and exercise encouraged Continue all meds Follow up  In 3 months    [place], FNP

## 2013-08-08 NOTE — Patient Instructions (Signed)
Diabetes and Exercise Exercising regularly is important. It is not just about losing weight. It has many health benefits, such as:  Improving your overall fitness, flexibility, and endurance.  Increasing your bone density.  Helping with weight control.  Decreasing your body fat.  Increasing your muscle strength.  Reducing stress and tension.  Improving your overall health. People with diabetes who exercise gain additional benefits because exercise:  Reduces appetite.  Improves the body's use of blood sugar (glucose).  Helps lower or control blood glucose.  Decreases blood pressure.  Helps control blood lipids (such as cholesterol and triglycerides).  Improves the body's use of the hormone insulin by:  Increasing the body's insulin sensitivity.  Reducing the body's insulin needs.  Decreases the risk for heart disease because exercising:  Lowers cholesterol and triglycerides levels.  Increases the levels of good cholesterol (such as high-density lipoproteins [HDL]) in the body.  Lowers blood glucose levels. YOUR ACTIVITY PLAN  Choose an activity that you enjoy and set realistic goals. Your health care provider or diabetes educator can help you make an activity plan that works for you. You can break activities into 2 or 3 sessions throughout the day. Doing so is as good as one long session. Exercise ideas include:  Taking the dog for a walk.  Taking the stairs instead of the elevator.  Dancing to your favorite song.  Doing your favorite exercise with a friend. RECOMMENDATIONS FOR EXERCISING WITH TYPE 1 OR TYPE 2 DIABETES   Check your blood glucose before exercising. If blood glucose levels are greater than 240 mg/dL, check for urine ketones. Do not exercise if ketones are present.  Avoid injecting insulin into areas of the body that are going to be exercised. For example, avoid injecting insulin into:  The arms when playing tennis.  The legs when  jogging.  Keep a record of:  Food intake before and after you exercise.  Expected peak times of insulin action.  Blood glucose levels before and after you exercise.  The type and amount of exercise you have done.  Review your records with your health care provider. Your health care provider will help you to develop guidelines for adjusting food intake and insulin amounts before and after exercising.  If you take insulin or oral hypoglycemic agents, watch for signs and symptoms of hypoglycemia. They include:  Dizziness.  Shaking.  Sweating.  Chills.  Confusion.  Drink plenty of water while you exercise to prevent dehydration or heat stroke. Body water is lost during exercise and must be replaced.  Talk to your health care provider before starting an exercise program to make sure it is safe for you. Remember, almost any type of activity is better than none. Document Released: 04/16/2003 Document Revised: 09/26/2012 Document Reviewed: 07/03/2012 [employer] Patient Information 2015 [employer]. This information is not intended to replace advice given to you by your health care provider. Make sure you discuss any questions you have with your health care provider.  

## 2013-08-09 LAB — CMP14+EGFR
A/G RATIO: 1.4 (ref 1.1–2.5)
ALK PHOS: 48 IU/L (ref 39–117)
ALT: 42 IU/L — AB (ref 0–32)
AST: 49 IU/L — AB (ref 0–40)
Albumin: 4 g/dL (ref 3.6–4.8)
BUN / CREAT RATIO: 30 — AB (ref 11–26)
BUN: 16 mg/dL (ref 8–27)
CO2: 27 mmol/L (ref 18–29)
CREATININE: 0.53 mg/dL — AB (ref 0.57–1.00)
Calcium: 9.8 mg/dL (ref 8.7–10.3)
Chloride: 99 mmol/L (ref 97–108)
GFR calc Af Amer: 119 mL/min/{1.73_m2} (ref >59)
GFR calc non Af Amer: 103 mL/min/{1.73_m2} (ref >59)
Globulin, Total: 2.9 g/dL (ref 1.5–4.5)
Glucose: 141 mg/dL — ABNORMAL HIGH (ref 65–99)
POTASSIUM: 5.1 mmol/L (ref 3.5–5.2)
SODIUM: 142 mmol/L (ref 134–144)
Total Bilirubin: 0.5 mg/dL (ref 0.0–1.2)
Total Protein: 6.9 g/dL (ref 6.0–8.5)

## 2013-08-09 LAB — NMR, LIPOPROFILE
Cholesterol: 186 mg/dL (ref 100–199)
HDL Cholesterol by NMR: 46 mg/dL (ref >39)
HDL Particle Number: 30.6 umol/L (ref >=30.5)
LDL Particle Number: 1391 nmol/L — ABNORMAL HIGH (ref <1000)
LDL SIZE: 20.8 nm (ref >20.5)
LDLC SERPL CALC-MCNC: 113 mg/dL — ABNORMAL HIGH (ref 0–99)
LP-IR SCORE: 55 — AB (ref <=45)
SMALL LDL PARTICLE NUMBER: 451 nmol/L (ref <=527)
Triglycerides by NMR: 133 mg/dL (ref 0–149)

## 2013-08-12 ENCOUNTER — Telehealth: Payer: Self-pay | Admitting: *Deleted

## 2013-08-12 NOTE — Telephone Encounter (Signed)
Call for lab results, please.

## 2013-08-14 ENCOUNTER — Other Ambulatory Visit: Payer: Self-pay | Admitting: Nurse Practitioner

## 2013-08-14 NOTE — Telephone Encounter (Signed)
Message copied by Cline Crock on Wed Aug 14, 2013 12:27 PM ------      Message from: Chevis Pretty      Created: Mon Aug 12, 2013  7:38 AM       Hgba1c discussed at appointment      Kidney and liver function stable      ldl particle numbers are worse then last time- currently on zocor- strict low fat diet and exercise- recheck in 3 months if no better will have to change meds. ------

## 2013-08-20 NOTE — Telephone Encounter (Signed)
Pt aware of lab results 

## 2013-08-21 ENCOUNTER — Encounter: Payer: Self-pay | Admitting: Nurse Practitioner

## 2013-09-03 ENCOUNTER — Other Ambulatory Visit: Payer: Self-pay | Admitting: Nurse Practitioner

## 2013-10-17 ENCOUNTER — Ambulatory Visit (INDEPENDENT_AMBULATORY_CARE_PROVIDER_SITE_OTHER): Payer: 59 | Admitting: Family Medicine

## 2013-10-17 ENCOUNTER — Encounter: Payer: Self-pay | Admitting: Family Medicine

## 2013-10-17 VITALS — BP 119/58 | HR 63 | Temp 97.4°F | Ht 64.0 in | Wt 279.0 lb

## 2013-10-17 DIAGNOSIS — L03119 Cellulitis of unspecified part of limb: Secondary | ICD-10-CM

## 2013-10-17 DIAGNOSIS — L02419 Cutaneous abscess of limb, unspecified: Secondary | ICD-10-CM

## 2013-10-17 MED ORDER — DOXYCYCLINE HYCLATE 100 MG PO TABS: 100.0000 mg | ORAL_TABLET | Freq: Two times a day (BID) | ORAL | Status: DC

## 2013-10-17 NOTE — Progress Notes (Signed)
   Subjective:    Patient ID: Ashley [name], female    DOB: March 16, 1950, 63 y.o.   MRN: [id number]  HPI This 63 y.o. female presents for evaluation of bilateral lower extremity edema and redness and discomfort in lower exxtremities.   Review of Systems C/o lower extremity edema No chest pain, SOB, HA, dizziness, vision change, N/V, diarrhea, constipation, dysuria, urinary urgency or frequency, myalgias, arthralgias or rash.     Objective:   Physical Exam  Vital signs noted  Well developed well nourished female.  HEENT - Head atraumatic Normocephalic Respiratory - Lungs CTA bilateral Cardiac - RRR S1 and S2 without murmur GI - Abdomen soft Nontender and bowel sounds active x 4 Extremities - Pre-tibial edema with erythema bilateral legs      Assessment & Plan:  Cellulitis of lower extremity, unspecified laterality - Plan: doxycycline (VIBRA-TABS) 100 MG tablet Po bid x 10 days.  Lasix 20mg  one po qd 2-3 days prn swelling.  Discussed venous compression stockings but patient declines.  Lysbeth Penner FNP

## 2013-11-04 ENCOUNTER — Telehealth: Payer: Self-pay | Admitting: Family Medicine

## 2013-11-04 NOTE — Telephone Encounter (Signed)
Follow up.

## 2013-11-05 NOTE — Telephone Encounter (Signed)
Left message to call back and schedule appointment.

## 2013-11-29 ENCOUNTER — Ambulatory Visit (INDEPENDENT_AMBULATORY_CARE_PROVIDER_SITE_OTHER): Payer: 59 | Admitting: Nurse Practitioner

## 2013-11-29 ENCOUNTER — Encounter: Payer: Self-pay | Admitting: Nurse Practitioner

## 2013-11-29 VITALS — BP 156/67 | HR 64 | Temp 97.1°F | Ht 64.0 in | Wt 281.2 lb

## 2013-11-29 DIAGNOSIS — R609 Edema, unspecified: Secondary | ICD-10-CM

## 2013-11-29 DIAGNOSIS — E1310 Other specified diabetes mellitus with ketoacidosis without coma: Secondary | ICD-10-CM

## 2013-11-29 DIAGNOSIS — E785 Hyperlipidemia, unspecified: Secondary | ICD-10-CM

## 2013-11-29 DIAGNOSIS — L03119 Cellulitis of unspecified part of limb: Secondary | ICD-10-CM

## 2013-11-29 DIAGNOSIS — I10 Essential (primary) hypertension: Secondary | ICD-10-CM

## 2013-11-29 DIAGNOSIS — E1159 Type 2 diabetes mellitus with other circulatory complications: Secondary | ICD-10-CM

## 2013-11-29 DIAGNOSIS — E1110 Type 2 diabetes mellitus with ketoacidosis without coma: Secondary | ICD-10-CM

## 2013-11-29 LAB — POCT GLYCOSYLATED HEMOGLOBIN (HGB A1C): HEMOGLOBIN A1C: 6.2

## 2013-11-29 MED ORDER — CIPROFLOXACIN HCL 500 MG PO TABS: 500.0000 mg | ORAL_TABLET | Freq: Two times a day (BID) | ORAL | Status: DC

## 2013-11-29 MED ORDER — METFORMIN HCL ER (MOD) 1000 MG PO TB24: 1000.0000 mg | ORAL_TABLET | Freq: Two times a day (BID) | ORAL | Status: DC

## 2013-11-29 MED ORDER — METOPROLOL TARTRATE 100 MG PO TABS: ORAL_TABLET | ORAL | Status: DC

## 2013-11-29 MED ORDER — SIMVASTATIN 40 MG PO TABS: 40.0000 mg | ORAL_TABLET | Freq: Every evening | ORAL | Status: DC

## 2013-11-29 MED ORDER — QUINAPRIL HCL 40 MG PO TABS: ORAL_TABLET | ORAL | Status: DC

## 2013-11-29 MED ORDER — FUROSEMIDE 20 MG PO TABS: 20.0000 mg | ORAL_TABLET | Freq: Every day | ORAL | Status: DC

## 2013-11-29 MED ORDER — DILTIAZEM HCL ER COATED BEADS 120 MG PO CP24: ORAL_CAPSULE | ORAL | Status: DC

## 2013-11-29 NOTE — Progress Notes (Signed)
Subjective:    Patient ID: Ashley [name], female    DOB: Jun 23, 1950, 63 y.o.   MRN: [id number]  Patient is here today for chronic disease follow up. No complaint.   Hypertension This is a chronic problem. The current episode started more than 1 year ago. The problem has been waxing and waning since onset. The problem is uncontrolled. Associated symptoms include peripheral edema. Pertinent negatives include no anxiety, chest pain, headaches, neck pain, orthopnea, palpitations or shortness of breath. There are no associated agents to hypertension. Risk factors for coronary artery disease include dyslipidemia, family history, diabetes mellitus, obesity and post-menopausal state. Past treatments include ACE inhibitors, calcium channel blockers and diuretics.  Hyperlipidemia This is a chronic problem. The current episode started more than 1 year ago. The problem is uncontrolled. Recent lipid tests were reviewed and are high. Exacerbating diseases include diabetes and obesity. She has no history of hypothyroidism. There are no known factors aggravating her hyperlipidemia. Pertinent negatives include no chest pain or shortness of breath. Current antihyperlipidemic treatment includes statins. The current treatment provides mild improvement of lipids. Compliance problems include adherence to diet and adherence to exercise.  Risk factors for coronary artery disease include diabetes mellitus, family history, hypertension, obesity and post-menopausal.  Diabetes She presents for her follow-up diabetic visit. She has type 2 diabetes mellitus. No MedicAlert identification noted. The initial diagnosis of diabetes was made 1 year ago. Her disease course has been improving. Pertinent negatives for hypoglycemia include no headaches. Pertinent negatives for diabetes include no chest pain, no foot paresthesias, no polydipsia, no polyphagia, no polyuria, no weakness and no weight loss. Symptoms are improving. Risk factors for  coronary artery disease include dyslipidemia, family history, hypertension, obesity and post-menopausal. Current diabetic treatment includes oral agent (monotherapy) and diet. She is compliant with treatment most of the time. Her weight is stable. She is following a diabetic diet. When asked about meal planning, she reported none. She has not had a previous visit with a dietician. She rarely participates in exercise. There is no change in her home blood glucose trend. (Patient doesn't check blood sugars like she should) An ACE inhibitor/angiotensin II receptor blocker is being taken. She does not see a podiatrist.Eye exam is current.  peripheral edema Lasix works well to keep swelling under control. She only takes it on the days she feels like she is retaining fluid.   * dx with cellulitis several weeks ago- still has some reddness of bil lower legs that worsens at night- hot to touch.  Review of Systems  Constitutional: Negative for weight loss.  Respiratory: Negative for shortness of breath.   Cardiovascular: Negative for chest pain, palpitations and orthopnea.  Endocrine: Negative for polydipsia, polyphagia and polyuria.  Musculoskeletal: Negative for neck pain.  Neurological: Negative for weakness and headaches.       Objective:   Physical Exam  Constitutional: She is oriented to person, place, and time. She appears well-developed and well-nourished.  HENT:  Nose: Nose normal.  Mouth/Throat: Oropharynx is clear and moist.  Eyes: EOM are normal.  Neck: Trachea normal, normal range of motion and full passive range of motion without pain. Neck supple. No JVD present. Carotid bruit is not present. No thyromegaly present.  Cardiovascular: Normal rate, regular rhythm, normal heart sounds and intact distal pulses.  Exam reveals no gallop and no friction rub.   No murmur heard. Pulmonary/Chest: Effort normal and breath sounds normal.  Abdominal: Soft. Bowel sounds are normal. She exhibits no  distension and  no mass. There is no tenderness.  Musculoskeletal: Normal range of motion. She exhibits no edema.  Erythematous patchy warm to touch areas bil lower ext  Lymphadenopathy:    She has no cervical adenopathy.  Neurological: She is alert and oriented to person, place, and time. She has normal reflexes.  Skin: Skin is warm and dry.  Psychiatric: She has a normal mood and affect. Her behavior is normal. Judgment and thought content normal.   BP 156/67  Pulse 64  Temp(Src) 97.1 F (36.2 C) (Oral)  Ht '5\' 4"'  (1.626 m)  Wt 281 lb 3.2 oz (127.551 kg)  BMI 48.24 kg/m2   Results for orders placed in visit on 11/29/13  POCT GLYCOSYLATED HEMOGLOBIN (HGB A1C)      Result Value Ref Range   Hemoglobin A1C 6.2    HM DIABETES EYE EXAM      Result Value Ref Range   HM Diabetic Eye Exam No Retinopathy  No Retinopathy         Assessment & Plan:   1. Hyperlipidemia Low fta diet - NMR, lipoprofile - simvastatin (ZOCOR) 40 MG tablet; Take 1 tablet (40 mg total) by mouth every evening.  Dispense: 30 tablet; Refill: 5  2. Essential hypertension Low NA+ diet - CMP14+EGFR - quinapril (ACCUPRIL) 40 MG tablet; TAKE ONE TABLET BY MOUTH ONE TIME DAILY  Dispense: 30 tablet; Refill: 5 - metoprolol (LOPRESSOR) 100 MG tablet; TAKE TWO TABLETS BY MOUTH IN THE MORNING AND ONE AT BEDTIME  Dispense: 90 tablet; Refill: 5 - diltiazem (CARDIZEM CD) 120 MG 24 hr capsule; TAKE ONE CAPSULE BY MOUTH ONE TIME DAILY  Dispense: 30 capsule; Refill: 5  3. Peripheral edema Elevate legs when sitting - furosemide (LASIX) 20 MG tablet; Take 1 tablet (20 mg total) by mouth daily.  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  Dispense: 60 tablet; Refill: 5  5.  Cellulitis of lower extremity, unspecified laterality Let me know if not improving - ciprofloxacin (CIPRO) 500 MG tablet; Take 1 tablet (500 mg total) by mouth 2 (two) times daily.  Dispense: 20 tablet; Refill: 0    Labs pending Health maintenance reviewed Diet and exercise encouraged Continue all meds Follow up  In 3 month   [place], FNP

## 2013-11-29 NOTE — Patient Instructions (Signed)

## 2013-12-01 LAB — CMP14+EGFR
ALT: 30 IU/L (ref 0–32)
AST: 46 IU/L — ABNORMAL HIGH (ref 0–40)
Albumin/Globulin Ratio: 1.3 (ref 1.1–2.5)
Albumin: 3.8 g/dL (ref 3.6–4.8)
Alkaline Phosphatase: 50 IU/L (ref 39–117)
BUN/Creatinine Ratio: 35 — ABNORMAL HIGH (ref 11–26)
BUN: 19 mg/dL (ref 8–27)
CALCIUM: 8.7 mg/dL (ref 8.7–10.3)
CHLORIDE: 99 mmol/L (ref 97–108)
CO2: 22 mmol/L (ref 18–29)
Creatinine, Ser: 0.54 mg/dL — ABNORMAL LOW (ref 0.57–1.00)
GFR calc Af Amer: 118 mL/min/{1.73_m2} (ref >59)
GFR calc non Af Amer: 102 mL/min/{1.73_m2} (ref >59)
GLUCOSE: 156 mg/dL — AB (ref 65–99)
Globulin, Total: 2.9 g/dL (ref 1.5–4.5)
Potassium: 4.4 mmol/L (ref 3.5–5.2)
SODIUM: 140 mmol/L (ref 134–144)
TOTAL PROTEIN: 6.7 g/dL (ref 6.0–8.5)
Total Bilirubin: 0.7 mg/dL (ref 0.0–1.2)

## 2013-12-01 LAB — NMR, LIPOPROFILE
Cholesterol: 135 mg/dL (ref 100–199)
HDL CHOLESTEROL BY NMR: 45 mg/dL (ref >39)
HDL Particle Number: 30.4 umol/L — ABNORMAL LOW (ref >=30.5)
LDL Particle Number: 849 nmol/L (ref <1000)
LDL Size: 20.8 nm (ref >20.5)
LDLC SERPL CALC-MCNC: 66 mg/dL (ref 0–99)
LP-IR Score: 46 — ABNORMAL HIGH (ref <=45)
SMALL LDL PARTICLE NUMBER: 383 nmol/L (ref <=527)
TRIGLYCERIDES BY NMR: 118 mg/dL (ref 0–149)

## 2013-12-03 ENCOUNTER — Telehealth: Payer: Self-pay | Admitting: Family Medicine

## 2013-12-03 NOTE — Telephone Encounter (Signed)
Message copied by Waverly Ferrari on Tue Dec 03, 2013  8:56 AM ------      Message from: Chevis Pretty      Created: Mon Dec 02, 2013  2:28 PM       Hgba1c discussed at appointment      Kidney and liver function stable      Cholesterol looks good      Continue current meds- low fat diet and exercise and recheck in 3 months       ------

## 2013-12-05 ENCOUNTER — Other Ambulatory Visit: Payer: Self-pay | Admitting: Nurse Practitioner

## 2013-12-05 DIAGNOSIS — E119 Type 2 diabetes mellitus without complications: Secondary | ICD-10-CM

## 2013-12-05 MED ORDER — METFORMIN HCL 500 MG PO TABS: 500.0000 mg | ORAL_TABLET | Freq: Two times a day (BID) | ORAL | Status: DC

## 2013-12-09 ENCOUNTER — Telehealth: Payer: Self-pay | Admitting: Nurse Practitioner

## 2013-12-09 ENCOUNTER — Other Ambulatory Visit: Payer: Self-pay | Admitting: Nurse Practitioner

## 2013-12-09 MED ORDER — METFORMIN HCL 1000 MG PO TABS: 1000.0000 mg | ORAL_TABLET | Freq: Two times a day (BID) | ORAL | Status: DC

## 2013-12-09 NOTE — Telephone Encounter (Signed)
What was changed about metformin- probably needs to be seen to reevaluate cellulitis

## 2013-12-09 NOTE — Telephone Encounter (Signed)
Last lab note says A1c discussed at appointment. Patient's medication hx shows metformin 1000mg  BID ordered through 10-17-13.  On last script ordered it has note to make patient aware of change but not a reason.  Please advise.

## 2013-12-09 NOTE — Telephone Encounter (Signed)
Discussed with patient reason for changing meds

## 2013-12-10 NOTE — Telephone Encounter (Signed)
She dose not remember what you said the change is for. So what is the change for?

## 2013-12-13 ENCOUNTER — Telehealth: Payer: Self-pay | Admitting: Nurse Practitioner

## 2013-12-13 NOTE — Telephone Encounter (Signed)
Labs up front.

## 2014-06-12 ENCOUNTER — Other Ambulatory Visit: Payer: Self-pay | Admitting: Nurse Practitioner

## 2014-06-16 ENCOUNTER — Other Ambulatory Visit: Payer: Self-pay | Admitting: Nurse Practitioner

## 2014-06-16 DIAGNOSIS — E785 Hyperlipidemia, unspecified: Secondary | ICD-10-CM

## 2014-06-17 MED ORDER — QUINAPRIL HCL 40 MG PO TABS: 40.0000 mg | ORAL_TABLET | Freq: Every day | ORAL | Status: DC

## 2014-06-17 MED ORDER — METFORMIN HCL 1000 MG PO TABS: 1000.0000 mg | ORAL_TABLET | Freq: Two times a day (BID) | ORAL | Status: DC

## 2014-06-17 MED ORDER — SIMVASTATIN 40 MG PO TABS: 40.0000 mg | ORAL_TABLET | Freq: Every evening | ORAL | Status: DC

## 2014-06-17 NOTE — Telephone Encounter (Signed)
done

## 2014-06-23 ENCOUNTER — Telehealth: Payer: Self-pay | Admitting: Nurse Practitioner

## 2014-06-23 DIAGNOSIS — R609 Edema, unspecified: Secondary | ICD-10-CM

## 2014-06-23 MED ORDER — METOPROLOL TARTRATE 100 MG PO TABS: ORAL_TABLET | ORAL | Status: DC

## 2014-06-23 MED ORDER — METFORMIN HCL 1000 MG PO TABS: 1000.0000 mg | ORAL_TABLET | Freq: Two times a day (BID) | ORAL | Status: DC

## 2014-06-23 MED ORDER — FUROSEMIDE 20 MG PO TABS: 20.0000 mg | ORAL_TABLET | Freq: Every day | ORAL | Status: DC

## 2014-06-23 MED ORDER — QUINAPRIL HCL 40 MG PO TABS: 40.0000 mg | ORAL_TABLET | Freq: Every day | ORAL | Status: DC

## 2014-06-23 NOTE — Telephone Encounter (Signed)
done

## 2014-07-16 ENCOUNTER — Telehealth: Payer: Self-pay | Admitting: Nurse Practitioner

## 2014-07-16 MED ORDER — DILTIAZEM HCL ER COATED BEADS 120 MG PO CP24: 120.0000 mg | ORAL_CAPSULE | Freq: Every day | ORAL | Status: DC

## 2014-07-16 MED ORDER — METOPROLOL TARTRATE 100 MG PO TABS: ORAL_TABLET | ORAL | Status: DC

## 2014-07-16 MED ORDER — METFORMIN HCL 1000 MG PO TABS: 1000.0000 mg | ORAL_TABLET | Freq: Two times a day (BID) | ORAL | Status: DC

## 2014-07-16 MED ORDER — QUINAPRIL HCL 40 MG PO TABS: 40.0000 mg | ORAL_TABLET | Freq: Every day | ORAL | Status: DC

## 2014-07-16 NOTE — Telephone Encounter (Signed)
done

## 2014-07-30 ENCOUNTER — Encounter: Payer: Self-pay | Admitting: Nurse Practitioner

## 2014-07-30 ENCOUNTER — Other Ambulatory Visit: Payer: Self-pay | Admitting: *Deleted

## 2014-07-30 ENCOUNTER — Ambulatory Visit (INDEPENDENT_AMBULATORY_CARE_PROVIDER_SITE_OTHER): Payer: BLUE CROSS/BLUE SHIELD | Admitting: Nurse Practitioner

## 2014-07-30 ENCOUNTER — Encounter (INDEPENDENT_AMBULATORY_CARE_PROVIDER_SITE_OTHER): Payer: Self-pay

## 2014-07-30 VITALS — BP 158/68 | HR 67 | Temp 97.3°F | Ht 64.0 in | Wt 288.2 lb

## 2014-07-30 DIAGNOSIS — I10 Essential (primary) hypertension: Secondary | ICD-10-CM

## 2014-07-30 DIAGNOSIS — R609 Edema, unspecified: Secondary | ICD-10-CM | POA: Diagnosis not present

## 2014-07-30 DIAGNOSIS — E785 Hyperlipidemia, unspecified: Secondary | ICD-10-CM

## 2014-07-30 DIAGNOSIS — E1110 Type 2 diabetes mellitus with ketoacidosis without coma: Secondary | ICD-10-CM

## 2014-07-30 DIAGNOSIS — E119 Type 2 diabetes mellitus without complications: Secondary | ICD-10-CM

## 2014-07-30 LAB — POCT GLYCOSYLATED HEMOGLOBIN (HGB A1C): Hemoglobin A1C: 6.6

## 2014-07-30 MED ORDER — SIMVASTATIN 40 MG PO TABS: 40.0000 mg | ORAL_TABLET | Freq: Every evening | ORAL | Status: DC

## 2014-07-30 MED ORDER — METFORMIN HCL 1000 MG PO TABS: 1000.0000 mg | ORAL_TABLET | Freq: Two times a day (BID) | ORAL | Status: DC

## 2014-07-30 MED ORDER — METOPROLOL TARTRATE 100 MG PO TABS: ORAL_TABLET | ORAL | Status: DC

## 2014-07-30 MED ORDER — FUROSEMIDE 20 MG PO TABS: 20.0000 mg | ORAL_TABLET | Freq: Every day | ORAL | Status: DC

## 2014-07-30 MED ORDER — AMLODIPINE BESYLATE 5 MG PO TABS: 5.0000 mg | ORAL_TABLET | Freq: Every day | ORAL | Status: DC

## 2014-07-30 MED ORDER — DILTIAZEM HCL ER COATED BEADS 120 MG PO CP24: 120.0000 mg | ORAL_CAPSULE | Freq: Every day | ORAL | Status: DC

## 2014-07-30 NOTE — Progress Notes (Signed)
Subjective:    Patient ID: Ashley [name], female    DOB: Feb 24, 1950, 64 y.o.   MRN: [id number]  Patient is here today for chronic disease follow up. No complaint.   Hypertension This is a chronic problem. The current episode started more than 1 year ago. The problem is controlled. Pertinent negatives include no chest pain, headaches, neck pain, palpitations or shortness of breath. Risk factors for coronary artery disease include dyslipidemia and post-menopausal state. Past treatments include ACE inhibitors, beta blockers and diuretics. The current treatment provides moderate improvement. Compliance problems include diet and exercise.  There is no history of kidney disease, CAD/MI or CVA.  Hyperlipidemia This is a chronic problem. The current episode started more than 1 year ago. The problem is uncontrolled. Recent lipid tests were reviewed and are normal. Exacerbating diseases include diabetes. She has no history of hypothyroidism. Pertinent negatives include no chest pain or shortness of breath. Current antihyperlipidemic treatment includes statins. The current treatment provides moderate improvement of lipids. Compliance problems include adherence to diet and adherence to exercise.  Risk factors for coronary artery disease include dyslipidemia, diabetes mellitus, hypertension and post-menopausal.  Diabetes She presents for her follow-up diabetic visit. She has type 2 diabetes mellitus. Pertinent negatives for hypoglycemia include no headaches. Pertinent negatives for diabetes include no chest pain, no polydipsia, no polyphagia, no polyuria and no weakness. Pertinent negatives for diabetic complications include no CVA. Her weight is stable. She is following a generally healthy diet. When asked about meal planning, she reported none. She has not had a previous visit with a dietitian. She rarely participates in exercise. Her breakfast blood glucose is taken between 9-10 am. Her breakfast blood glucose range  is generally 130-140 mg/dl. Her highest blood glucose is >200 mg/dl. Her overall blood glucose range is 130-140 mg/dl. An ACE inhibitor/angiotensin II receptor blocker is being taken. She does not see a podiatrist.Eye exam is not current.  peripheral edema Lasix works well to keep swelling under control. She only takes it on the days she feels like she is retaining fluid.   * dx with cellulitis several weeks ago- still has some reddness of bil lower legs that worsens at night- hot to touch.  Review of Systems  Respiratory: Negative for shortness of breath.   Cardiovascular: Negative for chest pain and palpitations.  Endocrine: Negative for polydipsia, polyphagia and polyuria.  Musculoskeletal: Negative for neck pain.  Neurological: Negative for weakness and headaches.       Objective:   Physical Exam  Constitutional: She is oriented to person, place, and time. She appears well-developed and well-nourished.  HENT:  Nose: Nose normal.  Mouth/Throat: Oropharynx is clear and moist.  Eyes: EOM are normal.  Neck: Trachea normal, normal range of motion and full passive range of motion without pain. Neck supple. No JVD present. Carotid bruit is not present. No thyromegaly present.  Cardiovascular: Normal rate, regular rhythm, normal heart sounds and intact distal pulses.  Exam reveals no gallop and no friction rub.   No murmur heard. Pulmonary/Chest: Effort normal and breath sounds normal.  Abdominal: Soft. Bowel sounds are normal. She exhibits no distension and no mass. There is no tenderness.  Musculoskeletal: Normal range of motion. She exhibits no edema.  Erythematous patchy warm to touch areas bil lower ext  Lymphadenopathy:    She has no cervical adenopathy.  Neurological: She is alert and oriented to person, place, and time. She has normal reflexes.  Skin: Skin is warm and dry.  Psychiatric: She  has a normal mood and affect. Her behavior is normal. Judgment and thought content normal.      BP 158/68 mmHg  Pulse 67  Temp(Src) 97.3 F (36.3 C) (Oral)  Ht _0  (1.626 m)  Wt 288 lb 3.2 oz (130.727 kg)  BMI 49.45 kg/m2        Assessment & Plan:   1. Essential hypertension Do not add salt to diet - CMP14+EGFR - metoprolol (LOPRESSOR) 100 MG tablet; TAKE TWO TABLETS BY MOUTH IN THE MORNING AND ONE AT BEDTIME  Dispense: 90 tablet; Refill: 5 - diltiazem (CARTIA XT) 120 MG 24 hr capsule; Take 1 capsule (120 mg total) by mouth daily.  Dispense: 30 capsule; Refill: 5 - amLODipine (NORVASC) 5 MG tablet; Take 1 tablet (5 mg total) by mouth daily.  Dispense: 30 tablet; Refill: 5  2. Type 2 diabetes mellitus without complication Strict carb counting - POCT glycosylated hemoglobin (Hb A1C) - metFORMIN (GLUCOPHAGE) 1000 MG tablet; Take 1 tablet (1,000 mg total) by mouth 2 (two) times daily with a meal.  Dispense: 60 tablet; Refill: 5  3. Peripheral edema Elevate legs when sitting - furosemide (LASIX) 20 MG tablet; Take 1 tablet (20 mg total) by mouth daily.  Dispense: 30 tablet; Refill: 5  4. Hyperlipidemia Low fat diet - Lipid panel - simvastatin (ZOCOR) 40 MG tablet; Take 1 tablet (40 mg total) by mouth every evening.  Dispense: 30 tablet; Refill: 5  5. BMI >40 Discussed diet and exercise for person with BMI >25 Will recheck weight in 3-6 months   Labs pending Health maintenance reviewed Diet and exercise encouraged Continue all meds Follow up  In 3 months   [place], FNP

## 2014-07-30 NOTE — Patient Instructions (Signed)

## 2014-07-31 LAB — CMP14+EGFR
ALBUMIN: 3.9 g/dL (ref 3.6–4.8)
ALT: 57 IU/L — ABNORMAL HIGH (ref 0–32)
AST: 68 IU/L — ABNORMAL HIGH (ref 0–40)
Albumin/Globulin Ratio: 1.3 (ref 1.1–2.5)
Alkaline Phosphatase: 59 IU/L (ref 39–117)
BUN/Creatinine Ratio: 32 — ABNORMAL HIGH (ref 11–26)
BUN: 17 mg/dL (ref 8–27)
Bilirubin Total: 0.7 mg/dL (ref 0.0–1.2)
CO2: 25 mmol/L (ref 18–29)
CREATININE: 0.53 mg/dL — AB (ref 0.57–1.00)
Calcium: 9.2 mg/dL (ref 8.7–10.3)
Chloride: 100 mmol/L (ref 97–108)
GFR calc Af Amer: 118 mL/min/{1.73_m2} (ref >59)
GFR calc non Af Amer: 102 mL/min/{1.73_m2} (ref >59)
GLUCOSE: 184 mg/dL — AB (ref 65–99)
Globulin, Total: 3.1 g/dL (ref 1.5–4.5)
Potassium: 4.5 mmol/L (ref 3.5–5.2)
Sodium: 142 mmol/L (ref 134–144)
TOTAL PROTEIN: 7 g/dL (ref 6.0–8.5)

## 2014-07-31 LAB — LIPID PANEL
Chol/HDL Ratio: 3.7 ratio units (ref 0.0–4.4)
Cholesterol, Total: 153 mg/dL (ref 100–199)
HDL: 41 mg/dL (ref >39)
LDL Calculated: 76 mg/dL (ref 0–99)
Triglycerides: 178 mg/dL — ABNORMAL HIGH (ref 0–149)
VLDL CHOLESTEROL CAL: 36 mg/dL (ref 5–40)

## 2014-08-28 ENCOUNTER — Encounter: Payer: Self-pay | Admitting: *Deleted

## 2014-11-12 ENCOUNTER — Other Ambulatory Visit: Payer: Self-pay | Admitting: Nurse Practitioner

## 2014-11-12 ENCOUNTER — Ambulatory Visit: Payer: BLUE CROSS/BLUE SHIELD | Admitting: Nurse Practitioner

## 2014-11-13 ENCOUNTER — Ambulatory Visit (INDEPENDENT_AMBULATORY_CARE_PROVIDER_SITE_OTHER): Payer: BLUE CROSS/BLUE SHIELD | Admitting: Nurse Practitioner

## 2014-11-13 ENCOUNTER — Encounter (INDEPENDENT_AMBULATORY_CARE_PROVIDER_SITE_OTHER): Payer: Self-pay

## 2014-11-13 ENCOUNTER — Encounter: Payer: Self-pay | Admitting: Nurse Practitioner

## 2014-11-13 VITALS — BP 137/70 | HR 57 | Temp 97.2°F | Ht 64.0 in | Wt 288.0 lb

## 2014-11-13 DIAGNOSIS — Z23 Encounter for immunization: Secondary | ICD-10-CM

## 2014-11-13 DIAGNOSIS — I10 Essential (primary) hypertension: Secondary | ICD-10-CM | POA: Diagnosis not present

## 2014-11-13 DIAGNOSIS — R609 Edema, unspecified: Secondary | ICD-10-CM

## 2014-11-13 DIAGNOSIS — E785 Hyperlipidemia, unspecified: Secondary | ICD-10-CM

## 2014-11-13 DIAGNOSIS — E119 Type 2 diabetes mellitus without complications: Secondary | ICD-10-CM

## 2014-11-13 LAB — POCT GLYCOSYLATED HEMOGLOBIN (HGB A1C): HEMOGLOBIN A1C: 7.1

## 2014-11-13 MED ORDER — METOPROLOL TARTRATE 100 MG PO TABS: ORAL_TABLET | ORAL | Status: DC

## 2014-11-13 MED ORDER — SIMVASTATIN 40 MG PO TABS: 40.0000 mg | ORAL_TABLET | Freq: Every evening | ORAL | Status: DC

## 2014-11-13 MED ORDER — AMLODIPINE BESYLATE 5 MG PO TABS: 2.5000 mg | ORAL_TABLET | Freq: Every day | ORAL | Status: DC

## 2014-11-13 MED ORDER — QUINAPRIL HCL 40 MG PO TABS: 40.0000 mg | ORAL_TABLET | Freq: Every day | ORAL | Status: DC

## 2014-11-13 MED ORDER — FUROSEMIDE 40 MG PO TABS: 40.0000 mg | ORAL_TABLET | Freq: Every day | ORAL | Status: DC

## 2014-11-13 MED ORDER — METFORMIN HCL 1000 MG PO TABS: 1000.0000 mg | ORAL_TABLET | Freq: Two times a day (BID) | ORAL | Status: DC

## 2014-11-13 MED ORDER — DILTIAZEM HCL ER COATED BEADS 120 MG PO CP24: 120.0000 mg | ORAL_CAPSULE | Freq: Every day | ORAL | Status: DC

## 2014-11-13 NOTE — Progress Notes (Addendum)
Subjective:    Patient ID: Ashley [name], female    DOB: 1950/12/23, 64 y.o.   MRN: [id number]  Patient is here today for chronic disease follow up. No complaint.   Hypertension This is a chronic problem. The current episode started more than 1 year ago. The problem is controlled. Pertinent negatives include no chest pain, headaches, neck pain, palpitations or shortness of breath. Risk factors for coronary artery disease include dyslipidemia and post-menopausal state. Past treatments include ACE inhibitors, beta blockers and diuretics. The current treatment provides moderate improvement. Compliance problems include diet and exercise.  There is no history of kidney disease, CAD/MI or CVA.  Hyperlipidemia This is a chronic problem. The current episode started more than 1 year ago. The problem is uncontrolled. Recent lipid tests were reviewed and are normal. Exacerbating diseases include diabetes. She has no history of hypothyroidism. Pertinent negatives include no chest pain or shortness of breath. Current antihyperlipidemic treatment includes statins. The current treatment provides moderate improvement of lipids. Compliance problems include adherence to diet and adherence to exercise.  Risk factors for coronary artery disease include dyslipidemia, diabetes mellitus, hypertension and post-menopausal.  Diabetes She presents for her follow-up diabetic visit. She has type 2 diabetes mellitus. Pertinent negatives for hypoglycemia include no headaches. Pertinent negatives for diabetes include no chest pain, no polydipsia, no polyphagia, no polyuria and no weakness. Pertinent negatives for diabetic complications include no CVA. Her weight is stable. She is following a generally healthy diet. When asked about meal planning, she reported none. She has not had a previous visit with a dietitian. She rarely participates in exercise. Her breakfast blood glucose is taken between 9-10 am. Her breakfast blood glucose range  is generally 130-140 mg/dl. Her highest blood glucose is >200 mg/dl. Her overall blood glucose range is 130-140 mg/dl. An ACE inhibitor/angiotensin II receptor blocker is being taken. She does not see a podiatrist.Eye exam is not current.  peripheral edema Swelling no better- legs stay swollen all the time despite taking lasix.    Review of Systems  Constitutional: Negative.   HENT: Negative.   Respiratory: Negative for shortness of breath.   Cardiovascular: Positive for leg swelling. Negative for chest pain and palpitations.  Endocrine: Negative for polydipsia, polyphagia and polyuria.  Genitourinary: Negative.   Musculoskeletal: Negative for neck pain.  Neurological: Negative.  Negative for weakness and headaches.  Psychiatric/Behavioral: Negative.   All other systems reviewed and are negative.      Objective:   Physical Exam  Constitutional: She is oriented to person, place, and time. She appears well-developed and well-nourished.  HENT:  Nose: Nose normal.  Mouth/Throat: Oropharynx is clear and moist.  Eyes: EOM are normal.  Neck: Trachea normal, normal range of motion and full passive range of motion without pain. Neck supple. No JVD present. Carotid bruit is not present. No thyromegaly present.  Cardiovascular: Normal rate, regular rhythm, normal heart sounds and intact distal pulses.  Exam reveals no gallop and no friction rub.   No murmur heard. Pulmonary/Chest: Effort normal and breath sounds normal.  Abdominal: Soft. Bowel sounds are normal. She exhibits no distension and no mass. There is no tenderness.  Musculoskeletal: Normal range of motion. She exhibits no edema.  Erythematous patchy warm to touch areas bil lower ext  Lymphadenopathy:    She has no cervical adenopathy.  Neurological: She is alert and oriented to person, place, and time. She has normal reflexes.  Skin: Skin is warm and dry.  Psychiatric: She has a normal  mood and affect. Her behavior is normal.  Judgment and thought content normal.     BP 137/70 mmHg  Pulse 57  Temp(Src) 97.2 F (36.2 C) (Oral)  Ht _0  (1.626 m)  Wt 288 lb (130.636 kg)  BMI 49.41 kg/m2  Results for orders placed or performed in visit on 11/13/14  POCT glycosylated hemoglobin (Hb A1C)  Result Value Ref Range   Hemoglobin A1C 7.1        Assessment & Plan:  1. Essential hypertension Do not add ssalt to diet - CMP14+EGFR - quinapril (ACCUPRIL) 40 MG tablet; Take 1 tablet (40 mg total) by mouth daily.  Dispense: 30 tablet; Refill: 5 - metoprolol (LOPRESSOR) 100 MG tablet; TAKE TWO TABLETS BY MOUTH IN THE MORNING AND ONE AT BEDTIME  Dispense: 90 tablet; Refill: 5 - diltiazem (CARTIA XT) 120 MG 24 hr capsule; Take 1 capsule (120 mg total) by mouth daily.  Dispense: 30 capsule; Refill: 5 - amLODipine (NORVASC) 5 MG tablet; Take 0.5 tablets (2.5 mg total) by mouth daily.  Dispense: 30 tablet; Refill: 5  2. Type 2 diabetes mellitus without complication, without long-term current use of insulin (HCC) Continue to watch carbs - POCT glycosylated hemoglobin (Hb A1C) - metFORMIN (GLUCOPHAGE) 1000 MG tablet; Take 1 tablet (1,000 mg total) by mouth 2 (two) times daily with a meal.  Dispense: 60 tablet; Refill: 5  3. Hyperlipidemia Low fat diet - Lipid panel - simvastatin (ZOCOR) 40 MG tablet; Take 1 tablet (40 mg total) by mouth every evening.  Dispense: 30 tablet; Refill: 5  4. Peripheral edema Deceased amlodipine to 2.5 mg daily to see if helps with swelling Increased lasix from 41m tp 484mdaily - furosemide (LASIX) 40 MG tablet; Take 1 tablet (40 mg total) by mouth daily.  Dispense: 30 tablet; Refill: 5  5. Morbid obesity due to excess calories (HCMount MorrisDiscussed diet and exercise for person with BMI >25 Will recheck weight in 3-6 months     Labs pending Health maintenance reviewed Diet and exercise encouraged Continue all meds Follow up  In 3 months   MaElizabethFNP

## 2014-11-13 NOTE — Patient Instructions (Signed)

## 2014-11-14 DIAGNOSIS — Z23 Encounter for immunization: Secondary | ICD-10-CM | POA: Diagnosis not present

## 2014-11-14 LAB — CMP14+EGFR
A/G RATIO: 1.3 (ref 1.1–2.5)
ALBUMIN: 3.9 g/dL (ref 3.6–4.8)
ALK PHOS: 57 IU/L (ref 39–117)
ALT: 47 IU/L — ABNORMAL HIGH (ref 0–32)
AST: 62 IU/L — AB (ref 0–40)
BUN / CREAT RATIO: 36 — AB (ref 11–26)
BUN: 20 mg/dL (ref 8–27)
Bilirubin Total: 0.6 mg/dL (ref 0.0–1.2)
CO2: 26 mmol/L (ref 18–29)
Calcium: 9.7 mg/dL (ref 8.7–10.3)
Chloride: 97 mmol/L (ref 97–108)
Creatinine, Ser: 0.55 mg/dL — ABNORMAL LOW (ref 0.57–1.00)
GFR calc Af Amer: 116 mL/min/{1.73_m2} (ref >59)
GFR calc non Af Amer: 101 mL/min/{1.73_m2} (ref >59)
GLOBULIN, TOTAL: 3 g/dL (ref 1.5–4.5)
Glucose: 134 mg/dL — ABNORMAL HIGH (ref 65–99)
POTASSIUM: 4.9 mmol/L (ref 3.5–5.2)
SODIUM: 140 mmol/L (ref 134–144)
Total Protein: 6.9 g/dL (ref 6.0–8.5)

## 2014-11-14 LAB — LIPID PANEL
Chol/HDL Ratio: 2.8 ratio units (ref 0.0–4.4)
Cholesterol, Total: 125 mg/dL (ref 100–199)
HDL: 44 mg/dL (ref >39)
LDL CALC: 55 mg/dL (ref 0–99)
TRIGLYCERIDES: 131 mg/dL (ref 0–149)
VLDL Cholesterol Cal: 26 mg/dL (ref 5–40)

## 2014-11-21 LAB — HM MAMMOGRAPHY: HM MAMMO: NORMAL

## 2014-12-01 ENCOUNTER — Encounter: Payer: Self-pay | Admitting: Nurse Practitioner

## 2014-12-01 ENCOUNTER — Ambulatory Visit (INDEPENDENT_AMBULATORY_CARE_PROVIDER_SITE_OTHER): Payer: BLUE CROSS/BLUE SHIELD | Admitting: Nurse Practitioner

## 2014-12-01 VITALS — BP 193/89 | HR 53 | Temp 97.2°F | Ht 64.0 in | Wt 289.0 lb

## 2014-12-01 DIAGNOSIS — I10 Essential (primary) hypertension: Secondary | ICD-10-CM

## 2014-12-01 DIAGNOSIS — L03119 Cellulitis of unspecified part of limb: Secondary | ICD-10-CM | POA: Diagnosis not present

## 2014-12-01 DIAGNOSIS — R609 Edema, unspecified: Secondary | ICD-10-CM

## 2014-12-01 MED ORDER — CIPROFLOXACIN HCL 500 MG PO TABS: 500.0000 mg | ORAL_TABLET | Freq: Two times a day (BID) | ORAL | Status: DC

## 2014-12-01 MED ORDER — CLONIDINE HCL 0.1 MG PO TABS
0.1000 mg | ORAL_TABLET | Freq: Two times a day (BID) | ORAL | Status: DC
Start: 2014-12-01 — End: 2014-12-08

## 2014-12-01 MED ORDER — FUROSEMIDE 40 MG PO TABS: ORAL_TABLET | ORAL | Status: DC

## 2014-12-01 NOTE — Patient Instructions (Signed)
Hypertension Hypertension, commonly called high blood pressure, is when the force of blood pumping through your arteries is too strong. Your arteries are the blood vessels that carry blood from your heart throughout your body. A blood pressure reading consists of a higher number over a lower number, such as 110/72. The higher number (systolic) is the pressure inside your arteries when your heart pumps. The lower number (diastolic) is the pressure inside your arteries when your heart relaxes. Ideally you want your blood pressure below 120/80. Hypertension forces your heart to work harder to pump blood. Your arteries may become narrow or stiff. Having untreated or uncontrolled hypertension can cause heart attack, stroke, kidney disease, and other problems. RISK FACTORS Some risk factors for high blood pressure are controllable. Others are not.  Risk factors you cannot control include:   Race. You may be at higher risk if you are African American.  Age. Risk increases with age.  Gender. Men are at higher risk than women before age 45 years. After age 65, women are at higher risk than men. Risk factors you can control include:  Not getting enough exercise or physical activity.  Being overweight.  Getting too much fat, sugar, calories, or salt in your diet.  Drinking too much alcohol. SIGNS AND SYMPTOMS Hypertension does not usually cause signs or symptoms. Extremely high blood pressure (hypertensive crisis) may cause headache, anxiety, shortness of breath, and nosebleed. DIAGNOSIS To check if you have hypertension, your health care provider will measure your blood pressure while you are seated, with your arm held at the level of your heart. It should be measured at least twice using the same arm. Certain conditions can cause a difference in blood pressure between your right and left arms. A blood pressure reading that is higher than normal on one occasion does not mean that you need treatment. If  it is not clear whether you have high blood pressure, you may be asked to return on a different day to have your blood pressure checked again. Or, you may be asked to monitor your blood pressure at home for 1 or more weeks. TREATMENT Treating high blood pressure includes making lifestyle changes and possibly taking medicine. Living a healthy lifestyle can help lower high blood pressure. You may need to change some of your habits. Lifestyle changes may include:  Following the DASH diet. This diet is high in fruits, vegetables, and whole grains. It is low in salt, red meat, and added sugars.  Keep your sodium intake below 2,300 mg per day.  Getting at least 30-45 minutes of aerobic exercise at least 4 times per week.  Losing weight if necessary.  Not smoking.  Limiting alcoholic beverages.  Learning ways to reduce stress. Your health care provider may prescribe medicine if lifestyle changes are not enough to get your blood pressure under control, and if one of the following is true:  You are 18-59 years of age and your systolic blood pressure is above 140.  You are 60 years of age or older, and your systolic blood pressure is above 150.  Your diastolic blood pressure is above 90.  You have diabetes, and your systolic blood pressure is over 140 or your diastolic blood pressure is over 90.  You have kidney disease and your blood pressure is above 140/90.  You have heart disease and your blood pressure is above 140/90. Your personal target blood pressure may vary depending on your medical conditions, your age, and other factors. HOME CARE INSTRUCTIONS    Have your blood pressure rechecked as directed by your health care provider.   Take medicines only as directed by your health care provider. Follow the directions carefully. Blood pressure medicines must be taken as prescribed. The medicine does not work as well when you skip doses. Skipping doses also puts you at risk for  problems.  Do not smoke.   Monitor your blood pressure at home as directed by your health care provider. SEEK MEDICAL CARE IF:   You think you are having a reaction to medicines taken.  You have recurrent headaches or feel dizzy.  You have swelling in your ankles.  You have trouble with your vision. SEEK IMMEDIATE MEDICAL CARE IF:  You develop a severe headache or confusion.  You have unusual weakness, numbness, or feel faint.  You have severe chest or abdominal pain.  You vomit repeatedly.  You have trouble breathing. MAKE SURE YOU:   Understand these instructions.  Will watch your condition.  Will get help right away if you are not doing well or get worse.   This information is not intended to replace advice given to you by your health care provider. Make sure you discuss any questions you have with your health care provider.   Document Released: 01/24/2005 Document Revised: 06/10/2014 Document Reviewed: 11/16/2012 Elsevier Interactive Patient Education 2016 [employer] Inc[employer]  

## 2014-12-01 NOTE — Progress Notes (Signed)
   Subjective:    Patient ID: Ashley [name], female    DOB: 05/22/1950, 64 y.o.   MRN: [id number]  HPI Patient in c/o rash bil lower ext with edema- we decreased her amlodipine 2 weeks ago because of swelling and that has helped some but now are red and itching.    Review of Systems  Constitutional: Negative.   HENT: Negative.   Respiratory: Negative.   Cardiovascular: Positive for leg swelling.  Gastrointestinal: Negative.   Genitourinary: Negative.   Neurological: Negative.   Psychiatric/Behavioral: Negative.   All other systems reviewed and are negative.      Objective:   Physical Exam  Constitutional: She is oriented to person, place, and time. She appears well-developed and well-nourished. No distress.  Cardiovascular: Normal rate, regular rhythm and normal heart sounds.   Pulmonary/Chest: Effort normal and breath sounds normal.  Musculoskeletal: She exhibits edema (1+ edema bil lower ext.).  Neurological: She is alert and oriented to person, place, and time.  Skin: There is erythema (maculopapular lesions bil lower ext.Marland Kitchen).   BP 193/89 mmHg  Pulse 53  Temp(Src) 97.2 F (36.2 C) (Oral)  Ht 5\' 4"  (1.626 m)  Wt 289 lb (131.09 kg)  BMI 49.58 kg/m2         Assessment & Plan:  1. Essential hypertension Do not add salt to diet Added clonidine to meds BID RTO in 2 weeks to recheck blood pressure - cloNIDine (CATAPRES) 0.1 MG tablet; Take 1 tablet (0.1 mg total) by mouth 2 (two) times daily.  Dispense: 60 tablet; Refill: 3  2. Peripheral edema Elevate legs when sitting Increased lasix by 1/2 tablet daily - furosemide (LASIX) 40 MG tablet; 1 po qam and 1/2 po in afternoon  Dispense: 45 tablet; Refill: 5  3. Cellulitis of lower extremity, unspecified laterality RTO in 2 weeks recheck - ciprofloxacin (CIPRO) 500 MG tablet; Take 1 tablet (500 mg total) by mouth 2 (two) times daily.  Dispense: 20 tablet; Refill: 0   Mary-Margaret Hassell Done, FNP

## 2014-12-08 ENCOUNTER — Encounter: Payer: Self-pay | Admitting: *Deleted

## 2014-12-08 ENCOUNTER — Encounter: Payer: Self-pay | Admitting: Nurse Practitioner

## 2014-12-08 ENCOUNTER — Ambulatory Visit (INDEPENDENT_AMBULATORY_CARE_PROVIDER_SITE_OTHER): Payer: BLUE CROSS/BLUE SHIELD | Admitting: Nurse Practitioner

## 2014-12-08 VITALS — BP 157/72 | HR 56 | Temp 97.3°F | Ht 64.0 in | Wt 288.0 lb

## 2014-12-08 DIAGNOSIS — I10 Essential (primary) hypertension: Secondary | ICD-10-CM | POA: Diagnosis not present

## 2014-12-08 DIAGNOSIS — L500 Allergic urticaria: Secondary | ICD-10-CM | POA: Diagnosis not present

## 2014-12-08 MED ORDER — METHYLPREDNISOLONE ACETATE 80 MG/ML IJ SUSP
80.0000 mg | Freq: Once | INTRAMUSCULAR | Status: AC
  Administered 2014-12-08: 80 mg via INTRAMUSCULAR

## 2014-12-08 MED ORDER — CLONIDINE HCL 0.2 MG PO TABS: 0.2000 mg | ORAL_TABLET | Freq: Two times a day (BID) | ORAL | Status: DC

## 2014-12-08 NOTE — Patient Instructions (Signed)
Hives Hives are itchy, red, swollen areas of the skin. They can vary in size and location on your body. Hives can come and go for hours or several days (acute hives) or for several weeks (chronic hives). Hives do not spread from person to person (noncontagious). They may get worse with scratching, exercise, and emotional stress. CAUSES   Allergic reaction to food, additives, or drugs.  Infections, including the common cold.  Illness, such as vasculitis, lupus, or thyroid disease.  Exposure to sunlight, heat, or cold.  Exercise.  Stress.  Contact with chemicals. SYMPTOMS   Red or white swollen patches on the skin. The patches may change size, shape, and location quickly and repeatedly.  Itching.  Swelling of the hands, feet, and face. This may occur if hives develop deeper in the skin. DIAGNOSIS  Your caregiver can usually tell what is wrong by performing a physical exam. Skin or blood tests may also be done to determine the cause of your hives. In some cases, the cause cannot be determined. TREATMENT  Mild cases usually get better with medicines such as antihistamines. Severe cases may require an emergency epinephrine injection. If the cause of your hives is known, treatment includes avoiding that trigger.  HOME CARE INSTRUCTIONS   Avoid causes that trigger your hives.  Take antihistamines as directed by your caregiver to reduce the severity of your hives. Non-sedating or low-sedating antihistamines are usually recommended. Do not drive while taking an antihistamine.  Take any other medicines prescribed for itching as directed by your caregiver.  Wear loose-fitting clothing.  Keep all follow-up appointments as directed by your caregiver. SEEK MEDICAL CARE IF:   You have persistent or severe itching that is not relieved with medicine.  You have painful or swollen joints. SEEK IMMEDIATE MEDICAL CARE IF:   You have a fever.  Your tongue or lips are swollen.  You have  trouble breathing or swallowing.  You feel tightness in the throat or chest.  You have abdominal pain. These problems may be the first sign of a life-threatening allergic reaction. Call your local emergency services (911 in [place] MAKE SURE YOU:   Understand these instructions.  Will watch your condition.  Will get help right away if you are not doing well or get worse.   This information is not intended to replace advice given to you by your health care provider. Make sure you discuss any questions you have with your health care provider.   Document Released: 01/24/2005 Document Revised: 01/29/2013 Document Reviewed: 04/19/2011 Elsevier Interactive Patient Education 2016 [employer] Inc.  

## 2014-12-08 NOTE — Progress Notes (Signed)
   Subjective:    Patient ID: Ashley [name], female    DOB: Oct 20, 1950, 64 y.o.   MRN: [id number]  HPI  Patient was seen 12/01/14 with venous stasis and cellulitis. Increased lasix dose and was given cipro. She is here today for recheck. Her legs are better but she has a rash that has developed all over since starting cipro.   Review of Systems  Constitutional: Negative.   HENT: Negative.   Respiratory: Negative.   Cardiovascular: Negative.   Genitourinary: Negative.   Neurological: Negative.   Psychiatric/Behavioral: Negative.   All other systems reviewed and are negative.      Objective:   Physical Exam  Constitutional: She appears well-developed and well-nourished.  Pulmonary/Chest: Effort normal and breath sounds normal.  Abdominal: Soft. Bowel sounds are normal.  Musculoskeletal:  Lower ext erythema almost completely resolved  Skin: Skin is warm and dry. Rash (hives all over body.) noted.  Psychiatric: She has a normal mood and affect. Thought content normal.   BP 157/72 mmHg  Pulse 56  Temp(Src) 97.3 F (36.3 C) (Oral)  Ht 5\' 4"  (1.626 m)  Wt 288 lb (130.636 kg)  BMI 49.41 kg/m2         Assessment & Plan:  1. Essential hypertension Increased clonidine dose to 0.2mg  BID Keep diary of blood pressures - cloNIDine (CATAPRES) 0.2 MG tablet; Take 1 tablet (0.2 mg total) by mouth 2 (two) times daily.  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  Mary-Margaret Hassell Done, FNP

## 2015-01-02 ENCOUNTER — Ambulatory Visit: Payer: BLUE CROSS/BLUE SHIELD | Admitting: Nurse Practitioner

## 2015-01-14 ENCOUNTER — Ambulatory Visit (INDEPENDENT_AMBULATORY_CARE_PROVIDER_SITE_OTHER): Payer: BLUE CROSS/BLUE SHIELD | Admitting: Nurse Practitioner

## 2015-01-14 ENCOUNTER — Encounter: Payer: Self-pay | Admitting: Nurse Practitioner

## 2015-01-14 VITALS — BP 138/82 | HR 82 | Temp 98.6°F | Ht 64.0 in | Wt 279.0 lb

## 2015-01-14 DIAGNOSIS — Z024 Encounter for examination for driving license: Secondary | ICD-10-CM

## 2015-01-14 LAB — HM DIABETES EYE EXAM

## 2015-01-14 NOTE — Progress Notes (Signed)
   Subjective:    Patient ID: Ashley [name], female    DOB: 1950-03-16, 64 y.o.   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 SHe is doing well without complaints. Patient Active Problem List   Diagnosis Date Noted  . Severe obesity (BMI >= 40) ([place]) 07/30/2014  . Hyperlipidemia 05/31/2010  . Hypertension 05/31/2010  . Diabetes ([place]) 05/31/2010  . Peripheral edema 05/31/2010   Outpatient Encounter Prescriptions as of 01/14/2015  Medication Sig  . amLODipine (NORVASC) 5 MG tablet Take 0.5 tablets (2.5 mg total) by mouth daily.  . cloNIDine (CATAPRES) 0.2 MG tablet Take 1 tablet (0.2 mg total) by mouth 2 (two) times daily.  Marland Kitchen diltiazem (CARTIA XT) 120 MG 24 hr capsule Take 1 capsule (120 mg total) by mouth daily.  . furosemide (LASIX) 40 MG tablet 1 po qam and 1/2 po in afternoon  . metFORMIN (GLUCOPHAGE) 1000 MG tablet Take 1 tablet (1,000 mg total) by mouth 2 (two) times daily with a meal.  . metoprolol (LOPRESSOR) 100 MG tablet TAKE TWO TABLETS BY MOUTH IN THE MORNING AND ONE AT BEDTIME  . quinapril (ACCUPRIL) 40 MG tablet Take 1 tablet (40 mg total) by mouth daily.  . simvastatin (ZOCOR) 40 MG tablet Take 1 tablet (40 mg total) by mouth every evening.  . [DISCONTINUED] ciprofloxacin (CIPRO) 500 MG tablet Take 1 tablet (500 mg total) by mouth 2 (two) times daily.   No facility-administered encounter medications on file as of 01/14/2015.       Review of Systems  Constitutional: Negative.   HENT: Negative.   Respiratory: Negative.   Cardiovascular: Negative.   Genitourinary: Negative.   Neurological: Negative.   Psychiatric/Behavioral: Negative.   All other systems reviewed and are negative.      Objective:   Physical Exam  Constitutional: She is oriented to person, place, and time. She appears well-developed and well-nourished.  HENT:  Nose: Nose normal.  Mouth/Throat: Oropharynx is clear and moist.  Eyes: EOM are normal.  Neck:  Trachea normal, normal range of motion and full passive range of motion without pain. Neck supple. No JVD present. Carotid bruit is not present. No thyromegaly present.  Cardiovascular: Normal rate, regular rhythm, normal heart sounds and intact distal pulses.  Exam reveals no gallop and no friction rub.   No murmur heard. Pulmonary/Chest: Effort normal and breath sounds normal.  Abdominal: Soft. Bowel sounds are normal. She exhibits no distension and no mass. There is no tenderness.  Musculoskeletal: Normal range of motion.  Lymphadenopathy:    She has no cervical adenopathy.  Neurological: She is alert and oriented to person, place, and time. She has normal reflexes.  Skin: Skin is warm and dry.  Psychiatric: She has a normal mood and affect. Her behavior is normal. Judgment and thought content normal.    BP 138/82 mmHg  Pulse 82  Temp(Src) 98.6 F (37 C) (Oral)  Ht 5\' 4"  (1.626 m)  Wt 279 lb (126.554 kg)  BMI 47.87 kg/m2       Assessment & Plan:   1. Encounter for examination for driving license    Keep follow up appointment Continue all meds DMV form filled out and submitted.  Mary-Margaret Hassell Done, FNP

## 2015-01-16 ENCOUNTER — Encounter: Payer: Self-pay | Admitting: *Deleted

## 2015-02-11 ENCOUNTER — Telehealth: Payer: Self-pay | Admitting: Nurse Practitioner

## 2015-02-11 DIAGNOSIS — I10 Essential (primary) hypertension: Secondary | ICD-10-CM

## 2015-02-11 MED ORDER — QUINAPRIL HCL 40 MG PO TABS: 40.0000 mg | ORAL_TABLET | Freq: Every day | ORAL | Status: DC

## 2015-02-11 MED ORDER — DILTIAZEM HCL ER COATED BEADS 120 MG PO CP24: 120.0000 mg | ORAL_CAPSULE | Freq: Every day | ORAL | Status: DC

## 2015-02-11 NOTE — Telephone Encounter (Signed)
Stp's husband and advised rx's were sent to the pharmacy as requested.

## 2015-02-11 NOTE — Telephone Encounter (Signed)
rx sent to [facility].

## 2015-03-13 ENCOUNTER — Telehealth: Payer: Self-pay | Admitting: Nurse Practitioner

## 2015-03-13 ENCOUNTER — Other Ambulatory Visit: Payer: Self-pay | Admitting: Nurse Practitioner

## 2015-04-07 ENCOUNTER — Telehealth: Payer: Self-pay | Admitting: Nurse Practitioner

## 2015-04-07 ENCOUNTER — Ambulatory Visit (INDEPENDENT_AMBULATORY_CARE_PROVIDER_SITE_OTHER): Payer: BLUE CROSS/BLUE SHIELD | Admitting: Family

## 2015-04-07 ENCOUNTER — Encounter: Payer: Self-pay | Admitting: Family

## 2015-04-07 VITALS — BP 166/76 | HR 62 | Temp 97.3°F | Ht 64.0 in | Wt 278.0 lb

## 2015-04-07 DIAGNOSIS — R509 Fever, unspecified: Secondary | ICD-10-CM

## 2015-04-07 DIAGNOSIS — R6889 Other general symptoms and signs: Secondary | ICD-10-CM | POA: Diagnosis not present

## 2015-04-07 DIAGNOSIS — J020 Streptococcal pharyngitis: Secondary | ICD-10-CM | POA: Diagnosis not present

## 2015-04-07 LAB — POCT RAPID STREP A (OFFICE): Rapid Strep A Screen: POSITIVE — AB

## 2015-04-07 LAB — POCT INFLUENZA A/B
INFLUENZA A, POC: NEGATIVE
INFLUENZA B, POC: NEGATIVE

## 2015-04-07 MED ORDER — AMOXICILLIN-POT CLAVULANATE 875-125 MG PO TABS: 1.0000 | ORAL_TABLET | Freq: Two times a day (BID) | ORAL | Status: DC

## 2015-04-07 NOTE — Patient Instructions (Signed)
Strep Throat Strep throat is a bacterial infection of the throat. Your health care provider may call the infection tonsillitis or pharyngitis, depending on whether there is swelling in the tonsils or at the back of the throat. Strep throat is most common during the cold months of the year in children who are 5-63 years of age, but it can happen during any season in people of any age. This infection is spread from person to person (contagious) through coughing, sneezing, or close contact. CAUSES Strep throat is caused by the bacteria called Streptococcus pyogenes. RISK FACTORS This condition is more likely to develop in:  People who spend time in crowded places where the infection can spread easily.  People who have close contact with someone who has strep throat. SYMPTOMS Symptoms of this condition include:  Fever or chills.   Redness, swelling, or pain in the tonsils or throat.  Pain or difficulty when swallowing.  White or yellow spots on the tonsils or throat.  Swollen, tender glands in the neck or under the jaw.  Red rash all over the body (rare). DIAGNOSIS This condition is diagnosed by performing a rapid strep test or by taking a swab of your throat (throat culture test). Results from a rapid strep test are usually ready in a few minutes, but throat culture test results are available after one or two days. TREATMENT This condition is treated with antibiotic medicine. HOME CARE INSTRUCTIONS Medicines  Take over-the-counter and prescription medicines only as told by your health care provider.  Take your antibiotic as told by your health care provider. Do not stop taking the antibiotic even if you start to feel better.  Have family members who also have a sore throat or fever tested for strep throat. They may need antibiotics if they have the strep infection. Eating and Drinking  Do not share food, drinking cups, or personal items that could cause the infection to spread to  other people.  If swallowing is difficult, try eating soft foods until your sore throat feels better.  Drink enough fluid to keep your urine clear or pale yellow. General Instructions  Gargle with a salt-water mixture 3-4 times per day or as needed. To make a salt-water mixture, completely dissolve -1 tsp of salt in 1 cup of warm water.  Make sure that all household members wash their hands well.  Get plenty of rest.  Stay home from school or work until you have been taking antibiotics for 24 hours.  Keep all follow-up visits as told by your health care provider. This is important. SEEK MEDICAL CARE IF:  The glands in your neck continue to get bigger.  You develop a rash, cough, or earache.  You cough up a thick liquid that is green, yellow-brown, or bloody.  You have pain or discomfort that does not get better with medicine.  Your problems seem to be getting worse rather than better.  You have a fever. SEEK IMMEDIATE MEDICAL CARE IF:  You have new symptoms, such as vomiting, severe headache, stiff or painful neck, chest pain, or shortness of breath.  You have severe throat pain, drooling, or changes in your voice.  You have swelling of the neck, or the skin on the neck becomes red and tender.  You have signs of dehydration, such as fatigue, dry mouth, and decreased urination.  You become increasingly sleepy, or you cannot wake up completely.  Your joints become red or painful.   This information is not intended to replace   advice given to you by your health care provider. Make sure you discuss any questions you have with your health care provider.   Document Released: 01/22/2000 Document Revised: 10/15/2014 Document Reviewed: 05/19/2014 Elsevier Interactive Patient Education 2016 [place] meds as prescribed - Use a cool mist humidifier  -Use saline nose sprays frequently -Saline irrigations of the nose can be very helpful if done frequently.  * 4X  daily for 1 week*  * Use of a nettie pot can be helpful with this. Follow directions with this* -Force fluids -For any cough or congestion  Use plain Mucinex- regular strength or max strength is fine   * Children- consult with Pharmacist for dosing -For fever or aces or pains- take tylenol or ibuprofen appropriate for age and weight.  * for fevers greater than 101 orally you may alternate ibuprofen and tylenol every  3 hours. -Throat lozenges if help -New toothbrush in 3 days   Evelina Dun, FNP

## 2015-04-07 NOTE — Telephone Encounter (Signed)
Pt has appt this afternoon at 4:40 and wanted to see if she could get something called in without being seen. Advised pt she ntbs to get any type of antibiotics. Pt voiced understanding.

## 2015-04-07 NOTE — Progress Notes (Signed)
Subjective:    Patient ID: Ashley [name], female    DOB: 29-Mar-1950, 65 y.o.   MRN: [id number]  Sore Throat  This is a new problem. The current episode started yesterday. The problem has been gradually worsening. The maximum temperature recorded prior to her arrival was 100.4 - 100.9 F. The pain is at a severity of 6/10. Associated symptoms include congestion, coughing, headaches and a hoarse voice. Pertinent negatives include no ear discharge, ear pain or shortness of breath. She has tried acetaminophen and NSAIDs for the symptoms. The treatment provided mild relief.  Sinus Problem Associated symptoms include chills, congestion, coughing, headaches, a hoarse voice and a sore throat. Pertinent negatives include no ear pain or shortness of breath.  Headache  Associated symptoms include coughing and a sore throat. Pertinent negatives include no ear pain.      Review of Systems  Constitutional: Positive for chills.  HENT: Positive for congestion, hoarse voice and sore throat. Negative for ear discharge and ear pain.   Eyes: Negative.   Respiratory: Positive for cough. Negative for shortness of breath.   Cardiovascular: Negative.  Negative for palpitations.  Gastrointestinal: Negative.   Endocrine: Negative.   Genitourinary: Negative.   Musculoskeletal: Negative.   Neurological: Positive for headaches.  Hematological: Negative.   Psychiatric/Behavioral: Negative.   All other systems reviewed and are negative.      Objective:   Physical Exam  Constitutional: She is oriented to person, place, and time. She appears well-developed and well-nourished. No distress.  HENT:  Head: Normocephalic and atraumatic.  Right Ear: External ear normal.  Left Ear: External ear normal.  Nasal passage erythemas with mild swelling  Oropharynx erythemas   Eyes: Pupils are equal, round, and reactive to light.  Neck: Normal range of motion. Neck supple. No thyromegaly present.  Tonsils 2+    Cardiovascular: Normal rate, regular rhythm, normal heart sounds and intact distal pulses.   No murmur heard. Pulmonary/Chest: Effort normal and breath sounds normal. No respiratory distress. She has no wheezes.  Abdominal: Soft. Bowel sounds are normal. She exhibits no distension. There is no tenderness.  Musculoskeletal: Normal range of motion. She exhibits no edema or tenderness.  Lymphadenopathy:    She has cervical adenopathy.  Neurological: She is alert and oriented to person, place, and time. She has normal reflexes. No cranial nerve deficit.  Skin: Skin is warm and dry.  Psychiatric: She has a normal mood and affect. Her behavior is normal. Judgment and thought content normal.  Vitals reviewed.  Results for orders placed or performed in visit on 04/07/15  POCT rapid strep A  Result Value Ref Range   Rapid Strep A Screen Positive (A) Negative      BP 166/76 mmHg  Pulse 62  Temp(Src) 97.3 F (36.3 C) (Oral)  Ht 5\' 4"  (1.626 m)  Wt 278 lb (126.1 kg)  BMI 47.70 kg/m2     Assessment & Plan:  1. Flu-like symptoms - POCT Influenza A/B - POCT rapid strep A  2. Streptococcal sore throat -- Take meds as prescribed - Use a cool mist humidifier  -Use saline nose sprays frequently -Saline irrigations of the nose can be very helpful if done frequently.  * 4X daily for 1 week*  * Use of a nettie pot can be helpful with this. Follow directions with this* -Force fluids -For any cough or congestion  Use plain Mucinex- regular strength or max strength is fine   * Children- consult with Pharmacist for dosing -For  fever or aces or pains- take tylenol or ibuprofen appropriate for age and weight.  * for fevers greater than 101 orally you may alternate ibuprofen and tylenol every  3 hours. -Throat lozenges if help -New toothbrush in 3 days - amoxicillin-clavulanate (AUGMENTIN) 875-125 MG tablet; Take 1 tablet by mouth 2 (two) times daily.  Dispense: 14 tablet; Refill: 0  Evelina Dun, FNP

## 2015-04-28 ENCOUNTER — Telehealth: Payer: Self-pay | Admitting: Nurse Practitioner

## 2015-06-11 ENCOUNTER — Other Ambulatory Visit: Payer: Self-pay

## 2015-06-11 DIAGNOSIS — I10 Essential (primary) hypertension: Secondary | ICD-10-CM

## 2015-06-11 MED ORDER — DILTIAZEM HCL ER COATED BEADS 120 MG PO CP24: 120.0000 mg | ORAL_CAPSULE | Freq: Every day | ORAL | Status: DC

## 2015-06-11 MED ORDER — CLONIDINE HCL 0.2 MG PO TABS: 0.2000 mg | ORAL_TABLET | Freq: Two times a day (BID) | ORAL | Status: DC

## 2015-06-12 ENCOUNTER — Other Ambulatory Visit: Payer: Self-pay

## 2015-06-12 DIAGNOSIS — R609 Edema, unspecified: Secondary | ICD-10-CM

## 2015-06-12 MED ORDER — FUROSEMIDE 40 MG PO TABS: ORAL_TABLET | ORAL | Status: DC

## 2015-06-19 ENCOUNTER — Encounter: Payer: Self-pay | Admitting: Family Medicine

## 2015-06-19 ENCOUNTER — Ambulatory Visit (INDEPENDENT_AMBULATORY_CARE_PROVIDER_SITE_OTHER): Payer: BLUE CROSS/BLUE SHIELD | Admitting: Family Medicine

## 2015-06-19 VITALS — BP 134/69 | HR 62 | Temp 97.5°F | Ht 64.0 in | Wt 283.2 lb

## 2015-06-19 DIAGNOSIS — L03116 Cellulitis of left lower limb: Secondary | ICD-10-CM

## 2015-06-19 DIAGNOSIS — I8312 Varicose veins of left lower extremity with inflammation: Secondary | ICD-10-CM

## 2015-06-19 DIAGNOSIS — I8311 Varicose veins of right lower extremity with inflammation: Secondary | ICD-10-CM

## 2015-06-19 DIAGNOSIS — I872 Venous insufficiency (chronic) (peripheral): Secondary | ICD-10-CM

## 2015-06-19 MED ORDER — SULFAMETHOXAZOLE-TRIMETHOPRIM 800-160 MG PO TABS: 1.0000 | ORAL_TABLET | Freq: Two times a day (BID) | ORAL | Status: DC

## 2015-06-19 NOTE — Progress Notes (Signed)
BP 134/69 mmHg  Pulse 62  Temp(Src) 97.5 F (36.4 C) (Oral)  Ht 5\' 4"  (1.626 m)  Wt 283 lb 3.2 oz (128.459 kg)  BMI 48.59 kg/m2   Subjective:    Patient ID: Ashley [name], female    DOB: 09/10/1950, 65 y.o.   MRN: [id number]  HPI: Ashley [name] is a 65 y.o. female presenting on 06/19/2015 for Rash   HPI Rash on legs Patient comes in today with a rash that's been going on for the past couple days on both of her lower legs. She has had this previously and was diagnosed with cellulitis and she feels like this is similar. She denies any fevers or chills. She does have this red macular rash on both lower extremities. The left lower extremity also has a little bit of warmth to palpation. She does have 1+ edema on both lower extremities as well that she has off and on before as well.  Relevant past medical, surgical, family and social history reviewed and updated as indicated. Interim medical history since our last visit reviewed. Allergies and medications reviewed and updated.  Review of Systems  Constitutional: Negative for fever and chills.  HENT: Negative for congestion, ear discharge and ear pain.   Eyes: Negative for redness and visual disturbance.  Respiratory: Negative for chest tightness and shortness of breath.   Cardiovascular: Negative for chest pain and leg swelling.  Genitourinary: Negative for dysuria and difficulty urinating.  Musculoskeletal: Negative for back pain and gait problem.  Skin: Positive for color change and rash.  Neurological: Negative for light-headedness and headaches.  Psychiatric/Behavioral: Negative for behavioral problems and agitation.  All other systems reviewed and are negative.   Per HPI unless specifically indicated above     Medication List       This list is accurate as of: 06/19/15  5:33 PM.  Always use your most recent med list.               amLODipine 5 MG tablet  Commonly known as:  NORVASC  TAKE 1 TABLET EVERY DAY     cloNIDine 0.2 MG tablet  Commonly known as:  CATAPRES  Take 1 tablet (0.2 mg total) by mouth 2 (two) times daily.     diltiazem 120 MG 24 hr capsule  Commonly known as:  CARTIA XT  Take 1 capsule (120 mg total) by mouth daily.     furosemide 40 MG tablet  Commonly known as:  LASIX  1 po qam and 1/2 po in afternoon     metFORMIN 1000 MG tablet  Commonly known as:  GLUCOPHAGE  Take 1 tablet (1,000 mg total) by mouth 2 (two) times daily with a meal.     metoprolol 100 MG tablet  Commonly known as:  LOPRESSOR  TAKE TWO TABLETS BY MOUTH IN THE MORNING AND ONE AT BEDTIME     quinapril 40 MG tablet  Commonly known as:  ACCUPRIL  Take 1 tablet (40 mg total) by mouth daily.     simvastatin 40 MG tablet  Commonly known as:  ZOCOR  Take 1 tablet (40 mg total) by mouth every evening.     sulfamethoxazole-trimethoprim 800-160 MG tablet  Commonly known as:  BACTRIM DS  Take 1 tablet by mouth 2 (two) times daily.           Objective:    BP 134/69 mmHg  Pulse 62  Temp(Src) 97.5 F (36.4 C) (Oral)  Ht 5\' 4"  (1.626 m)  Wt  283 lb 3.2 oz (128.459 kg)  BMI 48.59 kg/m2  Wt Readings from Last 3 Encounters:  06/19/15 283 lb 3.2 oz (128.459 kg)  04/07/15 278 lb (126.1 kg)  01/14/15 279 lb (126.554 kg)    Physical Exam  Constitutional: She is oriented to person, place, and time. She appears well-developed and well-nourished. No distress.  Eyes: Conjunctivae and EOM are normal. Pupils are equal, round, and reactive to light.  Cardiovascular: Normal rate, regular rhythm, normal heart sounds and intact distal pulses.   No murmur heard. Pulmonary/Chest: Effort normal and breath sounds normal. No respiratory distress. She has no wheezes.  Musculoskeletal: Normal range of motion. She exhibits edema. She exhibits no tenderness.  Neurological: She is alert and oriented to person, place, and time. Coordination normal.  Skin: Skin is warm and dry. Rash (Red macular rash on lower shin  bilaterally, warm to palpation on left lower extremity. 1+ edema on both lower extremities) noted. She is not diaphoretic.  Psychiatric: She has a normal mood and affect. Her behavior is normal.  Nursing note and vitals reviewed.   Results for orders placed or performed in visit on 04/07/15  POCT Influenza A/B  Result Value Ref Range   Influenza A, POC Negative Negative   Influenza B, POC Negative Negative  POCT rapid strep A  Result Value Ref Range   Rapid Strep A Screen Positive (A) Negative      Assessment & Plan:   Problem List Items Addressed This Visit    None    Visit Diagnoses    Cellulitis of left lower extremity    -  Primary    Relevant Medications    sulfamethoxazole-trimethoprim (BACTRIM DS) 800-160 MG tablet    Stasis dermatitis of both legs        Relevant Medications    sulfamethoxazole-trimethoprim (BACTRIM DS) 800-160 MG tablet        Follow up plan: Return if symptoms worsen or fail to improve.  Counseling provided for all of the vaccine components No orders of the defined types were placed in this encounter.    Caryl Pina, MD [facility] 06/19/2015, 5:33 PM

## 2015-06-27 ENCOUNTER — Ambulatory Visit (INDEPENDENT_AMBULATORY_CARE_PROVIDER_SITE_OTHER): Payer: BLUE CROSS/BLUE SHIELD | Admitting: Nurse Practitioner

## 2015-06-27 ENCOUNTER — Telehealth: Payer: Self-pay | Admitting: Nurse Practitioner

## 2015-06-27 VITALS — BP 183/81 | HR 71 | Temp 97.0°F | Ht 64.0 in | Wt 278.0 lb

## 2015-06-27 DIAGNOSIS — I8311 Varicose veins of right lower extremity with inflammation: Secondary | ICD-10-CM

## 2015-06-27 DIAGNOSIS — L03115 Cellulitis of right lower limb: Secondary | ICD-10-CM

## 2015-06-27 DIAGNOSIS — I872 Venous insufficiency (chronic) (peripheral): Secondary | ICD-10-CM

## 2015-06-27 DIAGNOSIS — I8312 Varicose veins of left lower extremity with inflammation: Secondary | ICD-10-CM | POA: Diagnosis not present

## 2015-06-27 MED ORDER — METHYLPREDNISOLONE ACETATE 80 MG/ML IJ SUSP
80.0000 mg | Freq: Once | INTRAMUSCULAR | Status: AC
  Administered 2015-06-27: 80 mg via INTRAMUSCULAR

## 2015-06-27 MED ORDER — CEPHALEXIN 500 MG PO CAPS: 500.0000 mg | ORAL_CAPSULE | Freq: Three times a day (TID) | ORAL | Status: DC

## 2015-06-27 MED ORDER — TRIAMCINOLONE ACETONIDE 0.1 % EX CREA: 1.0000 "application " | TOPICAL_CREAM | Freq: Two times a day (BID) | CUTANEOUS | Status: DC

## 2015-06-27 NOTE — Progress Notes (Addendum)
   Subjective:    Patient ID: Ashley [name], female    DOB: 02-Feb-1951, 65 y.o.   MRN: [id number]  HPI Tali saw Dr. Warrick Parisian last week and was dx with right lower leg cellulitis- was given bactrim- no change- now has spread to left lower leg. Very itchy and bumpy.    Review of Systems  Constitutional: Negative.   HENT: Negative.   Respiratory: Negative.   Cardiovascular: Negative.   Gastrointestinal: Negative.   All other systems reviewed and are negative.      Objective:   Physical Exam  Constitutional: She appears well-developed and well-nourished. No distress.  Cardiovascular: Normal rate, regular rhythm and normal heart sounds.   Pulmonary/Chest: Effort normal and breath sounds normal.  Skin: Skin is warm. Rash noted. There is erythema.  Maculopapular nonblanching rash bil lower ext.   BP 183/81 mmHg  Pulse 71  Temp(Src) 97 F (36.1 C) (Oral)  Ht 5\' 4"  (1.626 m)  Wt 278 lb (126.1 kg)  BMI 47.70 kg/m2       Assessment & Plan:   1. Cellulitis of leg, right   2. Stasis dermatitis of both legs    Meds ordered this encounter  Medications  . methylPREDNISolone acetate (DEPO-MEDROL) injection 80 mg    Sig:   . triamcinolone cream (KENALOG) 0.1 %    Sig: Apply 1 application topically 2 (two) times daily.    Dispense:  453 g    Refill:  0    Order Specific Question:  Supervising Provider    Answer:  Chipper Herb [[id number]]  . cephALEXin (KEFLEX) 500 MG capsule    Sig: Take 1 capsule (500 mg total) by mouth 3 (three) times daily.    Dispense:  30 capsule    Refill:  0    Order Specific Question:  Supervising Provider    Answer:  Chipper Herb [[id number]]  patient informed taht blood sugar may go up with steroid shot- watch carbs in diet closely. Mix triamcinolone cream with eucerin cream and apply to legs BID Avoid scratching RTO prn  Mary-Margaret Hassell Done, FNP

## 2015-06-27 NOTE — Telephone Encounter (Signed)
Per [facility]MM - pt aware she NTBS

## 2015-08-04 ENCOUNTER — Other Ambulatory Visit: Payer: Self-pay | Admitting: Nurse Practitioner

## 2015-09-03 ENCOUNTER — Other Ambulatory Visit: Payer: Self-pay | Admitting: Nurse Practitioner

## 2015-09-03 DIAGNOSIS — E785 Hyperlipidemia, unspecified: Secondary | ICD-10-CM

## 2015-09-03 NOTE — Telephone Encounter (Signed)
Refill denied- NTBS 

## 2015-09-03 NOTE — Telephone Encounter (Signed)
Last labs 11/13/2014. Please advise

## 2015-09-03 NOTE — Telephone Encounter (Signed)
Pt has an appt on Monday, will wait to get it filled then

## 2015-09-07 ENCOUNTER — Encounter: Payer: Self-pay | Admitting: Nurse Practitioner

## 2015-09-07 ENCOUNTER — Ambulatory Visit (INDEPENDENT_AMBULATORY_CARE_PROVIDER_SITE_OTHER): Payer: BLUE CROSS/BLUE SHIELD | Admitting: Nurse Practitioner

## 2015-09-07 VITALS — BP 153/78 | HR 54 | Temp 97.0°F | Ht 64.0 in | Wt 262.0 lb

## 2015-09-07 DIAGNOSIS — I10 Essential (primary) hypertension: Secondary | ICD-10-CM

## 2015-09-07 DIAGNOSIS — E119 Type 2 diabetes mellitus without complications: Secondary | ICD-10-CM | POA: Diagnosis not present

## 2015-09-07 DIAGNOSIS — R609 Edema, unspecified: Secondary | ICD-10-CM | POA: Diagnosis not present

## 2015-09-07 DIAGNOSIS — E785 Hyperlipidemia, unspecified: Secondary | ICD-10-CM

## 2015-09-07 LAB — BAYER DCA HB A1C WAIVED: HB A1C (BAYER DCA - WAIVED): 7.3 % — ABNORMAL HIGH (ref <7.0)

## 2015-09-07 MED ORDER — QUINAPRIL HCL 40 MG PO TABS: 40.0000 mg | ORAL_TABLET | Freq: Every day | ORAL | 1 refills | Status: DC

## 2015-09-07 MED ORDER — DILTIAZEM HCL ER COATED BEADS 120 MG PO CP24: 120.0000 mg | ORAL_CAPSULE | Freq: Every day | ORAL | 1 refills | Status: DC

## 2015-09-07 MED ORDER — SIMVASTATIN 40 MG PO TABS: 40.0000 mg | ORAL_TABLET | Freq: Every evening | ORAL | 1 refills | Status: DC

## 2015-09-07 MED ORDER — FUROSEMIDE 40 MG PO TABS: ORAL_TABLET | ORAL | 1 refills | Status: DC

## 2015-09-07 MED ORDER — AMLODIPINE BESYLATE 5 MG PO TABS: 5.0000 mg | ORAL_TABLET | Freq: Every day | ORAL | 1 refills | Status: DC

## 2015-09-07 MED ORDER — METOPROLOL TARTRATE 100 MG PO TABS: ORAL_TABLET | ORAL | 1 refills | Status: DC

## 2015-09-07 MED ORDER — CLONIDINE HCL 0.2 MG PO TABS: 0.2000 mg | ORAL_TABLET | Freq: Two times a day (BID) | ORAL | 1 refills | Status: DC

## 2015-09-07 MED ORDER — METFORMIN HCL 1000 MG PO TABS: 1000.0000 mg | ORAL_TABLET | Freq: Two times a day (BID) | ORAL | 1 refills | Status: DC

## 2015-09-07 NOTE — Patient Instructions (Signed)
Fall Prevention in the Home  Falls can cause injuries and can affect people from all age groups. There are many simple things that you can do to make your home safe and to help prevent falls. WHAT CAN I DO ON THE OUTSIDE OF MY HOME?  Regularly repair the edges of walkways and driveways and fix any cracks.  Remove high doorway thresholds.  Trim any shrubbery on the main path into your home.  Use bright outdoor lighting.  Clear walkways of debris and clutter, including tools and rocks.  Regularly check that handrails are securely fastened and in good repair. Both sides of any steps should have handrails.  Install guardrails along the edges of any raised decks or porches.  Have leaves, snow, and ice cleared regularly.  Use sand or salt on walkways during winter months.  In the garage, clean up any spills right away, including grease or oil spills. WHAT CAN I DO IN THE BATHROOM?  Use night lights.  Install grab bars by the toilet and in the tub and shower. Do not use towel bars as grab bars.  Use non-skid mats or decals on the floor of the tub or shower.  If you need to sit down while you are in the shower, use a plastic, non-slip stool..  Keep the floor dry. Immediately clean up any water that spills on the floor.  Remove soap buildup in the tub or shower on a regular basis.  Attach bath mats securely with double-sided non-slip rug tape.  Remove throw rugs and other tripping hazards from the floor. WHAT CAN I DO IN THE BEDROOM?  Use night lights.  Make sure that a bedside light is easy to reach.  Do not use oversized bedding that drapes onto the floor.  Have a firm chair that has side arms to use for getting dressed.  Remove throw rugs and other tripping hazards from the floor. WHAT CAN I DO IN THE KITCHEN?   Clean up any spills right away.  Avoid walking on wet floors.  Place frequently used items in easy-to-reach places.  If you need to reach for something  above you, use a sturdy step stool that has a grab bar.  Keep electrical cables out of the way.  Do not use floor polish or wax that makes floors slippery. If you have to use wax, make sure that it is non-skid floor wax.  Remove throw rugs and other tripping hazards from the floor. WHAT CAN I DO IN THE STAIRWAYS?  Do not leave any items on the stairs.  Make sure that there are handrails on both sides of the stairs. Fix handrails that are broken or loose. Make sure that handrails are as long as the stairways.  Check any carpeting to make sure that it is firmly attached to the stairs. Fix any carpet that is loose or worn.  Avoid having throw rugs at the top or bottom of stairways, or secure the rugs with carpet tape to prevent them from moving.  Make sure that you have a light switch at the top of the stairs and the bottom of the stairs. If you do not have them, have them installed. WHAT ARE SOME OTHER FALL PREVENTION TIPS?  Wear closed-toe shoes that fit well and support your feet. Wear shoes that have rubber soles or low heels.  When you use a stepladder, make sure that it is completely opened and that the sides are firmly locked. Have someone hold the ladder while you   are using it. Do not climb a closed stepladder.  Add color or contrast paint or tape to grab bars and handrails in your home. Place contrasting color strips on the first and last steps.  Use mobility aids as needed, such as canes, walkers, scooters, and crutches.  Turn on lights if it is dark. Replace any light bulbs that burn out.  Set up furniture so that there are clear paths. Keep the furniture in the same spot.  Fix any uneven floor surfaces.  Choose a carpet design that does not hide the edge of steps of a stairway.  Be aware of any and all pets.  Review your medicines with your healthcare provider. Some medicines can cause dizziness or changes in blood pressure, which increase your risk of falling. Talk  with your health care provider about other ways that you can decrease your risk of falls. This may include working with a physical therapist or trainer to improve your strength, balance, and endurance.   This information is not intended to replace advice given to you by your health care provider. Make sure you discuss any questions you have with your health care provider.   Document Released: 01/14/2002 Document Revised: 06/10/2014 Document Reviewed: 02/28/2014 Elsevier Interactive Patient Education 2016 [employer]  

## 2015-09-07 NOTE — Progress Notes (Signed)
Subjective:    Patient ID: Ashley [name], female    DOB: 26-May-1950, 65 y.o.   MRN: [id number]  Patient here today for follow up of chronic medical problems.  Outpatient Encounter Prescriptions as of 09/07/2015  Medication Sig  . amLODipine (NORVASC) 5 MG tablet TAKE 1 TABLET EVERY DAY  . cloNIDine (CATAPRES) 0.2 MG tablet Take 1 tablet (0.2 mg total) by mouth 2 (two) times daily.  Marland Kitchen diltiazem (CARTIA XT) 120 MG 24 hr capsule Take 1 capsule (120 mg total) by mouth daily.  . furosemide (LASIX) 40 MG tablet 1 po qam and 1/2 po in afternoon (Patient taking differently: as needed. 1 po qam and 1/2 po in afternoon)  . metFORMIN (GLUCOPHAGE) 1000 MG tablet TAKE 1 TABLET TWICE A DAY WITH A MEAL  . metoprolol (LOPRESSOR) 100 MG tablet TAKE TWO TABLETS BY MOUTH IN THE MORNING AND ONE AT BEDTIME  . quinapril (ACCUPRIL) 40 MG tablet Take 1 tablet (40 mg total) by mouth daily.  . simvastatin (ZOCOR) 40 MG tablet Take 1 tablet (40 mg total) by mouth every evening.  . triamcinolone cream (KENALOG) 0.1 % Apply 1 application topically 2 (two) times daily.     Hypertension  This is a chronic problem. The current episode started more than 1 year ago. The problem is controlled. Pertinent negatives include no chest pain, headaches, neck pain, palpitations or shortness of breath. Risk factors for coronary artery disease include dyslipidemia and post-menopausal state. Past treatments include ACE inhibitors, beta blockers and diuretics. The current treatment provides moderate improvement. Compliance problems include diet and exercise.  There is no history of kidney disease, CAD/MI, CVA or retinopathy.  Hyperlipidemia  This is a chronic problem. The current episode started more than 1 year ago. The problem is uncontrolled. Recent lipid tests were reviewed and are normal. Exacerbating diseases include diabetes. She has no history of hypothyroidism. Pertinent negatives include no chest pain or shortness of breath. Current  antihyperlipidemic treatment includes statins. The current treatment provides moderate improvement of lipids. Compliance problems include adherence to diet and adherence to exercise.  Risk factors for coronary artery disease include dyslipidemia, diabetes mellitus, hypertension and post-menopausal.  Diabetes  She presents for her follow-up diabetic visit. She has type 2 diabetes mellitus. Pertinent negatives for hypoglycemia include no headaches. Pertinent negatives for diabetes include no chest pain, no polydipsia, no polyphagia, no polyuria and no weakness. Pertinent negatives for diabetic complications include no CVA or retinopathy. Risk factors for coronary artery disease include dyslipidemia, obesity and post-menopausal. She is compliant with treatment some of the time. Her weight is stable. She is following a generally healthy diet. When asked about meal planning, she reported none. She has not had a previous visit with a dietitian. She rarely participates in exercise. Home blood sugar record trend: patient has not been checking blood sugars. An ACE inhibitor/angiotensin II receptor blocker is being taken. She does not see a podiatrist.Eye exam is current.  peripheral edema Swelling no better- legs stay swollen all the time despite taking lasix.    Review of Systems  Constitutional: Negative.   HENT: Negative.   Respiratory: Negative for shortness of breath.   Cardiovascular: Positive for leg swelling. Negative for chest pain and palpitations.  Endocrine: Negative for polydipsia, polyphagia and polyuria.  Genitourinary: Negative.   Musculoskeletal: Negative for neck pain.  Neurological: Negative.  Negative for weakness and headaches.  Psychiatric/Behavioral: Negative.   All other systems reviewed and are negative.      Objective:  Physical Exam  Constitutional: She is oriented to person, place, and time. She appears well-developed and well-nourished.  HENT:  Nose: Nose normal.   Mouth/Throat: Oropharynx is clear and moist.  Eyes: EOM are normal.  Neck: Trachea normal, normal range of motion and full passive range of motion without pain. Neck supple. No JVD present. Carotid bruit is not present. No thyromegaly present.  Cardiovascular: Normal rate, regular rhythm, normal heart sounds and intact distal pulses.  Exam reveals no gallop and no friction rub.   No murmur heard. Pulmonary/Chest: Effort normal and breath sounds normal.  Abdominal: Soft. Bowel sounds are normal. She exhibits no distension and no mass. There is no tenderness.  pendulous abdomen  Musculoskeletal: Normal range of motion. She exhibits no edema.  Lymphadenopathy:    She has no cervical adenopathy.  Neurological: She is alert and oriented to person, place, and time. She has normal reflexes.  Skin: Skin is warm and dry.  Psychiatric: She has a normal mood and affect. Her behavior is normal. Judgment and thought content normal.   BP (!) 153/78   Pulse (!) 54   Temp 97 F (36.1 C) (Oral)   Ht '5\' 4"'  (1.626 m)   Wt 262 lb (118.8 kg)   BMI 44.97 kg/m   hgba1c- 7.3% up from 7.1% last visit     Assessment & Plan:  1. Essential hypertension Do not add salt to diet - CMP14+EGFR - metoprolol (LOPRESSOR) 100 MG tablet; TAKE TWO TABLETS BY MOUTH IN THE MORNING AND ONE AT BEDTIME  Dispense: 270 tablet; Refill: 1 - cloNIDine (CATAPRES) 0.2 MG tablet; Take 1 tablet (0.2 mg total) by mouth 2 (two) times daily.  Dispense: 180 tablet; Refill: 1 - diltiazem (CARTIA XT) 120 MG 24 hr capsule; Take 1 capsule (120 mg total) by mouth daily.  Dispense: 90 capsule; Refill: 1 - quinapril (ACCUPRIL) 40 MG tablet; Take 1 tablet (40 mg total) by mouth daily.  Dispense: 90 tablet; Refill: 1 - amLODipine (NORVASC) 5 MG tablet; Take 1 tablet (5 mg total) by mouth daily.  Dispense: 90 tablet; Refill: 1  2. Type 2 diabetes mellitus without complication, without long-term current use of insulin (HCC) Stricter carb  counting Make sure take metformin BID - Bayer DCA Hb A1c Waived - Microalbumin / creatinine urine ratio - metFORMIN (GLUCOPHAGE) 1000 MG tablet; Take 1 tablet (1,000 mg total) by mouth 2 (two) times daily with a meal.  Dispense: 180 tablet; Refill: 1  3. Hyperlipidemia Low fat diet - Lipid panel - simvastatin (ZOCOR) 40 MG tablet; Take 1 tablet (40 mg total) by mouth every evening.  Dispense: 90 tablet; Refill: 1  4. Peripheral edema Elevated legs whjen sitting - furosemide (LASIX) 40 MG tablet; 1 po qam and 1/2 po in afternoon  Dispense: 135 tablet; Refill: 1  5. Morbid obesity due to excess calories ([place]) Discussed diet and exercise for person with BMI >25 Will recheck weight in 3-6 months     Labs pending Health maintenance reviewed Diet and exercise encouraged Continue all meds Follow up  In 3 months   [place], FNP

## 2015-09-08 ENCOUNTER — Telehealth: Payer: Self-pay | Admitting: Nurse Practitioner

## 2015-09-08 LAB — CMP14+EGFR
ALBUMIN: 4.1 g/dL (ref 3.6–4.8)
ALK PHOS: 46 IU/L (ref 39–117)
ALT: 25 IU/L (ref 0–32)
AST: 31 IU/L (ref 0–40)
Albumin/Globulin Ratio: 1.5 (ref 1.2–2.2)
BILIRUBIN TOTAL: 0.5 mg/dL (ref 0.0–1.2)
BUN / CREAT RATIO: 29 — AB (ref 12–28)
BUN: 14 mg/dL (ref 8–27)
CO2: 26 mmol/L (ref 18–29)
CREATININE: 0.48 mg/dL — AB (ref 0.57–1.00)
Calcium: 9.5 mg/dL (ref 8.7–10.3)
Chloride: 99 mmol/L (ref 96–106)
GFR calc non Af Amer: 105 mL/min/{1.73_m2} (ref >59)
GFR, EST AFRICAN AMERICAN: 121 mL/min/{1.73_m2} (ref >59)
GLOBULIN, TOTAL: 2.8 g/dL (ref 1.5–4.5)
GLUCOSE: 110 mg/dL — AB (ref 65–99)
Potassium: 4.5 mmol/L (ref 3.5–5.2)
SODIUM: 138 mmol/L (ref 134–144)
TOTAL PROTEIN: 6.9 g/dL (ref 6.0–8.5)

## 2015-09-08 LAB — LIPID PANEL
CHOL/HDL RATIO: 2.6 ratio (ref 0.0–4.4)
Cholesterol, Total: 125 mg/dL (ref 100–199)
HDL: 49 mg/dL (ref >39)
LDL CALC: 54 mg/dL (ref 0–99)
Triglycerides: 111 mg/dL (ref 0–149)
VLDL CHOLESTEROL CAL: 22 mg/dL (ref 5–40)

## 2015-09-08 NOTE — Telephone Encounter (Signed)
Pt aware of results 

## 2015-09-17 ENCOUNTER — Encounter: Payer: Self-pay | Admitting: Pediatrics

## 2015-09-17 ENCOUNTER — Ambulatory Visit (INDEPENDENT_AMBULATORY_CARE_PROVIDER_SITE_OTHER): Payer: BLUE CROSS/BLUE SHIELD | Admitting: Pediatrics

## 2015-09-17 VITALS — BP 152/69 | HR 63 | Temp 97.5°F | Ht 64.0 in | Wt 281.2 lb

## 2015-09-17 DIAGNOSIS — L03115 Cellulitis of right lower limb: Secondary | ICD-10-CM | POA: Diagnosis not present

## 2015-09-17 DIAGNOSIS — R600 Localized edema: Secondary | ICD-10-CM

## 2015-09-17 DIAGNOSIS — E119 Type 2 diabetes mellitus without complications: Secondary | ICD-10-CM | POA: Diagnosis not present

## 2015-09-17 DIAGNOSIS — R609 Edema, unspecified: Secondary | ICD-10-CM | POA: Diagnosis not present

## 2015-09-17 DIAGNOSIS — I10 Essential (primary) hypertension: Secondary | ICD-10-CM | POA: Diagnosis not present

## 2015-09-17 MED ORDER — CEPHALEXIN 500 MG PO CAPS: 500.0000 mg | ORAL_CAPSULE | Freq: Three times a day (TID) | ORAL | 0 refills | Status: DC

## 2015-09-17 NOTE — Progress Notes (Signed)
    Subjective:    Patient ID: Ashley [name], female    DOB: 11-11-1950, 65 y.o.   MRN: [id number]  CC: Cellulitis   HPI: Sarada Devanney is a 65 y.o. female presenting for Cellulitis  Started having red, hot rash front of her R leg 5 days ago Started taking keflex that she had at home Has helped rash get better Here because out of keflex, rash still there Rash was slighlty tender at first No rash anywhere else Denies any breaks in the skin Does have some swelling at times b/l LE No fevers No HA, no CP   Depression screen [facility] 2/9 09/17/2015 09/07/2015 06/27/2015 06/19/2015 11/13/2014  Decreased Interest 0 0 0 0 0  Down, Depressed, Hopeless 0 0 0 0 0  PHQ - 2 Score 0 0 0 0 0     Relevant past medical, surgical, family and social history reviewed and updated. Interim medical history since our last visit reviewed. Allergies and medications reviewed and updated.  History  Smoking Status  . Never Smoker  Smokeless Tobacco  . Never Used    ROS: Per HPI      Objective:    BP (!) 152/69   Pulse 63   Temp 97.5 F (36.4 C) (Oral)   Ht 5\' 4"  (1.626 m)   Wt 281 lb 3.2 oz (127.6 kg)   BMI 48.27 kg/m   Wt Readings from Last 3 Encounters:  09/17/15 281 lb 3.2 oz (127.6 kg)  09/07/15 262 lb (118.8 kg)  06/27/15 278 lb (126.1 kg)     Gen: NAD, alert, cooperative with exam, NCAT EYES: EOMI, no scleral injection or icterus CV: NRRR, normal S1/S2, no murmur, distal pulses 2+ b/l Resp: CTABL, no wheezes, normal WOB Ext: trace to 1+ pitting edema b/l ankles and lower legs, warm Neuro: Alert and oriented, strength equal b/l UE and LE, coordination grossly normal Skin: R ant shin with red, raised rash, slightly warm, covering mid anterior shin     Assessment & Plan:    Leyah was seen today for cellulitis and med problem f/u.  Diagnoses and all orders for this visit:  Cellulitis of right lower extremity -     cephALEXin (KEFLEX) 500 MG capsule; Take 1 capsule (500 mg total) by  mouth 3 (three) times daily.  Essential hypertension Elevated Recheck next visit  Type 2 diabetes mellitus without complication, without long-term current use of insulin ([place]) Well controlled, cont metformin  Peripheral edema Worse when she hangs her feet down and is active during the day Discussed propping feet up whenever she can No SOB, no CP   Follow up plan: Return in about 4 weeks (around 10/15/2015).  Assunta Found, MD [facility] 09/17/2015, 11:12 AM

## 2015-10-10 ENCOUNTER — Ambulatory Visit (INDEPENDENT_AMBULATORY_CARE_PROVIDER_SITE_OTHER): Payer: BLUE CROSS/BLUE SHIELD | Admitting: Family Medicine

## 2015-10-10 VITALS — BP 138/66 | HR 60 | Temp 97.3°F | Ht 64.0 in | Wt 280.0 lb

## 2015-10-10 DIAGNOSIS — M75102 Unspecified rotator cuff tear or rupture of left shoulder, not specified as traumatic: Secondary | ICD-10-CM

## 2015-10-10 DIAGNOSIS — L03115 Cellulitis of right lower limb: Secondary | ICD-10-CM

## 2015-10-10 MED ORDER — CEPHALEXIN 500 MG PO CAPS: 500.0000 mg | ORAL_CAPSULE | Freq: Three times a day (TID) | ORAL | 0 refills | Status: DC

## 2015-10-10 NOTE — Progress Notes (Signed)
   HPI  Patient presents today here with concern for right lower extremity cellulitis and left shoulder pain.  Rash on the right lower extremity has been getting rapidly worse over the last 24 hours Described as slightly tender and red. No fevers, chills, sweats. She's tolerating food and fluids normally.  She states her leg swelling has been slightly worse lately, she's not wearing compression stockings in the summer.  Left shoulder pain Has been worse over the last month, she had a fall landing squarely on her left shoulder about 3 or 4 months ago She had improvement of pain gradually  PMH: Smoking status noted ROS: Per HPI  Objective: BP 138/66 (BP Location: Left Arm, Patient Position: Sitting, Cuff Size: Large)   Pulse 60   Temp 97.3 F (36.3 C) (Oral)   Ht 5\' 4"  (1.626 m)   Wt 280 lb (127 kg)   BMI 48.06 kg/m  Gen: NAD, alert, cooperative with exam HEENT: NCAT, EOMI, PERRL CV: RRR, good S1/S2, no murmur Resp: CTABL, no wheezes, non-labored Ext: No edema, warm Neuro: Alert and oriented, No gross deficits  Skin:  7X 8 cm area of erythema on the posterior right lower extremity, fluctuance, drainage, or areas of induration concerning for abscess.  Shoulder- MSK Left shoulder with limited range of motion, unable to 4 more than 90, Tenderness to palpation over the anterior deltoid and bicipital groove Positive Neer's and Hawkins test, empty can test slightly positive.   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 Last treated a few weeks ago. Recommended wearing compression stockings consistently to keep it away.  # Rotator cuff syndrome New problem Discussed/offered a referral, however I believe that she could get some good improvement with good home exercises or PT. Also offered a PT referral she would like home exercises Given handout from  sports medicine patient advisor   Meds ordered this encounter  Medications  . cephALEXin (KEFLEX) 500 MG capsule    Sig: Take 1 capsule (500 mg total) by mouth 3 (three) times daily.    Dispense:  21 capsule    Refill:  [place], MD [facility] 10/10/2015, 9:57 AM

## 2015-10-10 NOTE — Patient Instructions (Signed)
Great to meet you!  Please come back if the redness or pain on the leg gets worse.    Cellulitis Cellulitis is an infection of the skin and the tissue beneath it. The infected area is usually red and tender. Cellulitis occurs most often in the arms and lower legs.  CAUSES  Cellulitis is caused by bacteria that enter the skin through cracks or cuts in the skin. The most common types of bacteria that cause cellulitis are staphylococci and streptococci. SIGNS AND SYMPTOMS   Redness and warmth.  Swelling.  Tenderness or pain.  Fever. DIAGNOSIS  Your health care provider can usually determine what is wrong based on a physical exam. Blood tests may also be done. TREATMENT  Treatment usually involves taking an antibiotic medicine. HOME CARE INSTRUCTIONS   Take your antibiotic medicine as directed by your health care provider. Finish the antibiotic even if you start to feel better.  Keep the infected arm or leg elevated to reduce swelling.  Apply a warm cloth to the affected area up to 4 times per day to relieve pain.  Take medicines only as directed by your health care provider.  Keep all follow-up visits as directed by your health care provider. SEEK MEDICAL CARE IF:   You notice red streaks coming from the infected area.  Your red area gets larger or turns dark in color.  Your bone or joint underneath the infected area becomes painful after the skin has healed.  Your infection returns in the same area or another area.  You notice a swollen bump in the infected area.  You develop new symptoms.  You have a fever. SEEK IMMEDIATE MEDICAL CARE IF:   You feel very sleepy.  You develop vomiting or diarrhea.  You have a general ill feeling (malaise) with muscle aches and pains.   This information is not intended to replace advice given to you by your health care provider. Make sure you discuss any questions you have with your health care provider.   Document Released:  11/03/2004 Document Revised: 10/15/2014 Document Reviewed: 04/11/2011 Elsevier Interactive Patient Education [employer].

## 2015-10-15 ENCOUNTER — Ambulatory Visit: Payer: BLUE CROSS/BLUE SHIELD | Admitting: Pediatrics

## 2015-10-19 ENCOUNTER — Telehealth: Payer: Self-pay | Admitting: Nurse Practitioner

## 2015-10-19 DIAGNOSIS — L03115 Cellulitis of right lower limb: Secondary | ICD-10-CM

## 2015-10-19 MED ORDER — CEPHALEXIN 500 MG PO CAPS: 500.0000 mg | ORAL_CAPSULE | Freq: Three times a day (TID) | ORAL | 0 refills | Status: DC

## 2015-10-19 NOTE — Telephone Encounter (Signed)
  Refilled keflex, very low threshold for return,.   Laroy Apple, MD [facility] 10/19/2015, 11:06 AM

## 2015-10-19 NOTE — Telephone Encounter (Signed)
Patient notified

## 2015-10-19 NOTE — Telephone Encounter (Signed)
Please advise and route to Pool A 

## 2015-11-06 ENCOUNTER — Ambulatory Visit (INDEPENDENT_AMBULATORY_CARE_PROVIDER_SITE_OTHER): Payer: BLUE CROSS/BLUE SHIELD | Admitting: Family

## 2015-11-06 ENCOUNTER — Encounter: Payer: Self-pay | Admitting: Family

## 2015-11-06 VITALS — BP 151/81 | HR 64 | Temp 99.3°F | Ht 64.0 in | Wt 287.0 lb

## 2015-11-06 DIAGNOSIS — M25512 Pain in left shoulder: Secondary | ICD-10-CM

## 2015-11-06 DIAGNOSIS — L03115 Cellulitis of right lower limb: Secondary | ICD-10-CM | POA: Diagnosis not present

## 2015-11-06 MED ORDER — CLINDAMYCIN HCL 300 MG PO CAPS: 300.0000 mg | ORAL_CAPSULE | Freq: Four times a day (QID) | ORAL | 0 refills | Status: DC

## 2015-11-06 NOTE — Progress Notes (Addendum)
   Subjective:    Patient ID: Ashley [name], female    DOB: August 05, 1950, 65 y.o.   MRN: [id number]  HPI Pt presents to the office today with bilateral leg swelling, warmness, and erythemas. PT states she was seen in the office on 10/10/15 and started on keflex 500 mg TID for 7 days. PT states this improved her legs, but it returned a few days ago. Pt states it has progressively become worse. States she is having tenderness of 6 out 10.    'Pt states when she saw Dr. Wendi Snipes she was having left shoulder pain and was told if it did not improve she would need an x-ray. Pt is seen during late night clinic and do not have x-ray currently. Told to come back next week for x-ray.   Review of Systems  HENT: Negative.   Respiratory: Negative.   Cardiovascular: Positive for palpitations.  Musculoskeletal: Positive for arthralgias.       Objective:   Physical Exam  Constitutional: She is oriented to person, place, and time. She appears well-developed and well-nourished.  HENT:  Head: Normocephalic.  Cardiovascular: Normal rate, regular rhythm, normal heart sounds and intact distal pulses.   Pulmonary/Chest: Effort normal and breath sounds normal.  Musculoskeletal: She exhibits edema (3+ in BLE) and tenderness (right lower leg).  Neurological: She is alert and oriented to person, place, and time.  Psychiatric: She has a normal mood and affect. Her behavior is normal. Judgment and thought content normal.    BP (!) 151/81   Pulse 64   Temp 99.3 F (37.4 C) (Oral)   Ht 5\' 4"  (1.626 m)   Wt 287 lb (130.2 kg)   BMI 49.26 kg/m        Assessment & Plan:  1. Cellulitis of right lower extremity -Rest -Keep elevated -Compression hose -Pt states she can not take lasix BID because of increase urination. Discussed trying to take the BID when she is at home RTO prn or if leg worsens or does not improve - clindamycin (CLEOCIN) 300 MG capsule; Take 1 capsule (300 mg total) by mouth 4 (four) times  daily.  Dispense: 28 capsule; Refill: 0  'Pt states when she saw Dr. Wendi Snipes she was having left shoulder pain and was told if it did not improve she would need an x-ray. Pt is seen during late night clinic and do not have x-ray currently. Told to come back next week for x-ray.   Evelina Dun, FNP

## 2015-11-06 NOTE — Addendum Note (Signed)
Addended by: Evelina Dun A on: 11/06/2015 05:59 PM   Modules accepted: Orders

## 2015-11-06 NOTE — Patient Instructions (Signed)

## 2015-11-09 ENCOUNTER — Other Ambulatory Visit: Payer: Self-pay | Admitting: Family

## 2015-11-09 ENCOUNTER — Other Ambulatory Visit (INDEPENDENT_AMBULATORY_CARE_PROVIDER_SITE_OTHER): Payer: BLUE CROSS/BLUE SHIELD

## 2015-11-09 DIAGNOSIS — M25512 Pain in left shoulder: Principal | ICD-10-CM

## 2015-11-09 DIAGNOSIS — G8929 Other chronic pain: Secondary | ICD-10-CM

## 2015-11-10 ENCOUNTER — Telehealth: Payer: Self-pay | Admitting: Nurse Practitioner

## 2015-11-12 NOTE — Telephone Encounter (Signed)
LMOVM for pt to see if she had contacted her insurance as to which meter they will pay for

## 2015-11-12 NOTE — Telephone Encounter (Signed)
Need to know which meter they will pay for they will not do all of them- we can give her a meter from closet if want to.

## 2015-12-09 NOTE — Telephone Encounter (Signed)
Patient states that she is trying to get in touch with her insurance to see which meter they will cover and when she finds out she will call us back.

## 2015-12-10 ENCOUNTER — Other Ambulatory Visit: Payer: Self-pay | Admitting: Nurse Practitioner

## 2015-12-10 MED ORDER — GLUCOSE BLOOD VI STRP: ORAL_STRIP | 12 refills | Status: DC

## 2015-12-15 ENCOUNTER — Ambulatory Visit: Payer: BLUE CROSS/BLUE SHIELD | Attending: Orthopedic Surgery | Admitting: Physical Therapy

## 2015-12-15 DIAGNOSIS — M25512 Pain in left shoulder: Secondary | ICD-10-CM | POA: Insufficient documentation

## 2015-12-15 DIAGNOSIS — M25612 Stiffness of left shoulder, not elsewhere classified: Secondary | ICD-10-CM | POA: Diagnosis present

## 2015-12-15 NOTE — Therapy (Signed)
[facility] [place], [place], [address] Phone: [phone]   Fax:  [phone]  Physical Therapy Evaluation  Patient Details  Name: Ashley [name] MRN: [id number] Date of Birth: 1950-10-17 Referring Provider: Jerilynn Mages PA-C.  Encounter Date: 12/15/2015      PT End of Session - 12/15/15 1220    Visit Number 1   Number of Visits 12   Date for PT Re-Evaluation 01/26/16   PT Start Time 1118   PT Stop Time 1203   PT Time Calculation (min) 45 min   Activity Tolerance Patient tolerated treatment well   Behavior During Therapy [facility] for tasks assessed/performed      Past Medical History:  Diagnosis Date  . Depression   . Hyperlipidemia   . Hypertension     No past surgical history on file.  There were no vitals filed for this visit.       Subjective Assessment - 12/15/15 1149    Subjective The patient reports that onset of left shoulder pain months ago.  Her resting pain-level is a 4/10 today but can rise to [id number] with certain movements.  Keeping the shoulder still decreases her pain and movement increases her pain.   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   Currently in Pain? Yes   Pain Score 4    Pain Location Shoulder   Pain Orientation Left   Pain Descriptors / Indicators Aching;Throbbing;Sharp   Pain Type Acute pain   Pain Onset More than a month ago   Pain Frequency Constant   Aggravating Factors  Please see above.   Pain Relieving Factors Please see above.   Effect of Pain on Daily Activities Please see above.            [facility] PT Assessment - 12/15/15 0001      Assessment   Medical Diagnosis Shoulder Impingement, left.   Referring Provider Jerilynn Mages PA-C.   Onset Date/Surgical Date --  Months ago.     Precautions   Precautions None     Restrictions   Weight Bearing Restrictions No     Balance Screen   Has the patient fallen in the past  6 months No   Has the patient had a decrease in activity level because of a fear of falling?  No   Is the patient reluctant to leave their home because of a fear of falling?  No     Home Environment   Living Environment Private residence     Prior Function   Level of Independence Independent     Posture/Postural Control   Posture/Postural Control Postural limitations   Postural Limitations Rounded Shoulders;Forward head   Posture Comments --  Left humeral head seated anteriorly.     ROM / Strength   AROM / PROM / Strength AROM;Strength     AROM   Overall AROM Comments Left shoulder flexion= 75 degrees; ER= 38 degrees; IR= 37 degrees; horiz add= 100 degrees.     Strength   Overall Strength Comments Left deltoid strength= 3-/5; IR/ER= 4-/5.     Palpation   Palpation comment Tender to palpation over left anterior shoulder region with referred pain down nearly to left elbow.     Special Tests    Special Tests Rotator Cuff Impingement   Rotator Cuff Impingment tests Neer impingement test;Hawkins- Kennedy test;Drop Arm test     Neer Impingement test    Findings Positive  Side Left     Hawkins-Kennedy test   Findings Positive   Side Left     Drop Arm test   Findings Negative     Ambulation/Gait   Gait Comments WNL.                   [facility] Adult PT Treatment/Exercise - 12/15/15 0001      Modalities   Modalities Electrical Stimulation;Moist Heat     Moist Heat Therapy   Number Minutes Moist Heat 20 Minutes   Moist Heat Location --  Left shoulder.     Acupuncturist Location Left shoulder.   Electrical Stimulation Action IFC   Electrical Stimulation Parameters 80-150 HZ x 20 minutes.   Electrical Stimulation Goals Pain                     PT Long Term Goals - 12/15/15 1218      PT LONG TERM GOAL #1   Title Independent with a HEP.   Time 4   Period Weeks   Status New     PT LONG TERM GOAL #2    Title Active left shoulder flexion to 145 degrees so the patient can easily reach overhead   Time 4   Period Weeks   Status New     PT LONG TERM GOAL #3   Title Active ER to 70 degrees+ to allow for easily donning/doffing of apparel   Time 4   Period Weeks   Status New     PT LONG TERM GOAL #4   Title Increase ROM so patient is able to reach behind back to L3.   Time 4   Period Weeks   Status New     PT LONG TERM GOAL #5   Title Increase left shoulder strength to a solid 4+/5 to increase stability for performance of functional activities   Time 4   Period Weeks   Status New     PT LONG TERM GOAL #6   Title Perform ADL's with left shoulder pain not > 3/10.   Time 4   Period Weeks   Status New               Plan - 12/15/15 1211    Clinical Impression Statement The patient's left shoulder lack a significant amount of range of motion in all directions.  She tested positive for left shoulder Impingement.  Her left shoulder is globally weak as well.     Rehab Potential Good   PT Frequency 3x / week   PT Duration 4 weeks   PT Treatment/Interventions ADLs/Self Care Home Management;Electrical Stimulation;Ultrasound;Therapeutic activities;Therapeutic exercise;Neuromuscular re-education;Patient/family education;Passive range of motion;Manual techniques   PT Next Visit Plan Posterior capsular stretch; anterior capsular stretch; Pulleys; UE Ranger; PROM; modalites PRN.      Patient will benefit from skilled therapeutic intervention in order to improve the following deficits and impairments:  Pain, Decreased activity tolerance, Decreased range of motion, Decreased strength  Visit Diagnosis: Acute pain of left shoulder - Plan: PT plan of care cert/re-cert  Stiffness of left shoulder, not elsewhere classified - Plan: PT plan of care cert/re-cert     Problem List Patient Active Problem List   Diagnosis Date Noted  . Severe obesity (BMI >= 40) ([place]) 07/30/2014  .  Hyperlipidemia 05/31/2010  . Hypertension 05/31/2010  . Diabetes ([place]) 05/31/2010  . Peripheral edema 05/31/2010    APP[place], [place] MPT 12/15/2015, 12:24 PM  [facility] [place], [place], [address] Phone: [phone]   Fax:  [phone]  Name: Ashley [name] MRN: [id number] Date of Birth: Apr 13, 1950

## 2015-12-18 ENCOUNTER — Encounter: Payer: Self-pay | Admitting: Physical Therapy

## 2015-12-18 ENCOUNTER — Ambulatory Visit: Payer: BLUE CROSS/BLUE SHIELD | Admitting: Physical Therapy

## 2015-12-18 DIAGNOSIS — M25612 Stiffness of left shoulder, not elsewhere classified: Secondary | ICD-10-CM

## 2015-12-18 DIAGNOSIS — M25512 Pain in left shoulder: Secondary | ICD-10-CM

## 2015-12-18 NOTE — Therapy (Signed)
[facility] [place], [place], [address] Phone: [phone]   Fax:  [phone]  Physical Therapy Treatment  Patient Details  Name: Ashley [name] MRN: [id number] Date of Birth: 1950/05/26 Referring Provider: Jerilynn Mages PA-C.  Encounter Date: 12/18/2015      PT End of Session - 12/18/15 0911    Visit Number 2   Number of Visits 12   Date for PT Re-Evaluation 01/26/16   PT Start Time 0902   PT Stop Time 0955   PT Time Calculation (min) 53 min   Activity Tolerance Patient tolerated treatment well   Behavior During Therapy [facility] for tasks assessed/performed      Past Medical History:  Diagnosis Date  . Depression   . Hyperlipidemia   . Hypertension     No past surgical history on file.  There were no vitals filed for this visit.      Subjective Assessment - 12/18/15 0911    Subjective Patient reports improvement in regards to getting hair curled, getting hand behind her back and reaching for seatbelt.   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   Currently in Pain? Other (Comment)  Gave no numerical rating for L shoulder pain            [facility] PT Assessment - 12/18/15 0001      Assessment   Medical Diagnosis Shoulder Impingement, left.   Next MD Visit TBD     Precautions   Precautions None     Restrictions   Weight Bearing Restrictions No                     [facility] Adult PT Treatment/Exercise - 12/18/15 0001      Exercises   Exercises Shoulder     Shoulder Exercises: Standing   Horizontal ABduction Strengthening;Both;20 reps;Theraband   Theraband Level (Shoulder Horizontal ABduction) Level 1 (Yellow)   External Rotation Strengthening;Both;20 reps;Theraband   Theraband Level (Shoulder External Rotation) Level 1 (Yellow)     Shoulder Exercises: Pulleys   Flexion Other (comment)  x5 min   Other Pulley Exercises Seated UE ranger flex/ CW  circles/ CCW circles x20 reps     Shoulder Exercises: Stretch   Internal Rotation Stretch 5 reps   Internal Rotation Stretch Limitations x10 sec hold   External Rotation Stretch 5 reps;10 seconds     Modalities   Modalities Electrical Stimulation;Moist Heat     Moist Heat Therapy   Number Minutes Moist Heat 15 Minutes   Moist Heat Location Shoulder     Electrical Stimulation   Electrical Stimulation Location L shoulder   Electrical Stimulation Action Pre-Mod   Electrical Stimulation Parameters 80-150 hz x15 min   Electrical Stimulation Goals Pain     Manual Therapy   Manual Therapy Passive ROM   Passive ROM PROM of L shoulder into flex/ER with gentle holds at end range                     PT Long Term Goals - 12/15/15 1218      PT LONG TERM GOAL #1   Title Independent with a HEP.   Time 4   Period Weeks   Status New     PT LONG TERM GOAL #2   Title Active left shoulder flexion to 145 degrees so the patient can easily reach overhead   Time 4   Period Weeks   Status  New     PT LONG TERM GOAL #3   Title Active ER to 70 degrees+ to allow for easily donning/doffing of apparel   Time 4   Period Weeks   Status New     PT LONG TERM GOAL #4   Title Increase ROM so patient is able to reach behind back to L3.   Time 4   Period Weeks   Status New     PT LONG TERM GOAL #5   Title Increase left shoulder strength to a solid 4+/5 to increase stability for performance of functional activities   Time 4   Period Weeks   Status New     PT LONG TERM GOAL #6   Title Perform ADL's with left shoulder pain not > 3/10.   Time 4   Period Weeks   Status New               Plan - 12/18/15 0943    Clinical Impression Statement Patient arrived to treatment with reports of improvement regarding L shoulder and ADLs. Patient able to complete all exercises per direction with moderate multimodal cueing for proper exercise technique. Firm end feels noted with PROM of  L shoulder with smooth arc of motion. Normal modalities response following removal of the modalities. Patient educated to monitor symptoms until next appointment.   Rehab Potential Good   PT Frequency 3x / week   PT Duration 4 weeks   PT Treatment/Interventions ADLs/Self Care Home Management;Electrical Stimulation;Ultrasound;Therapeutic activities;Therapeutic exercise;Neuromuscular re-education;Patient/family education;Passive range of motion;Manual techniques   PT Next Visit Plan Posterior capsular stretch; anterior capsular stretch; Pulleys; UE Ranger; PROM; modalites PRN.   Consulted and Agree with Plan of Care Patient      Patient will benefit from skilled therapeutic intervention in order to improve the following deficits and impairments:  Pain, Decreased activity tolerance, Decreased range of motion, Decreased strength  Visit Diagnosis: Acute pain of left shoulder  Stiffness of left shoulder, not elsewhere classified     Problem List Patient Active Problem List   Diagnosis Date Noted  . Severe obesity (BMI >= 40) ([place]) 07/30/2014  . Hyperlipidemia 05/31/2010  . Hypertension 05/31/2010  . Diabetes ([place]) 05/31/2010  . Peripheral edema 05/31/2010    Wynelle Fanny, PTA 12/18/2015, 10:31 AM  [facility] [address] [place], [place], [address] Phone: [phone]   Fax:  [phone]  Name: Ashley [name] MRN: [id number] Date of Birth: Apr 20, 1950

## 2015-12-22 ENCOUNTER — Ambulatory Visit: Payer: BLUE CROSS/BLUE SHIELD | Admitting: *Deleted

## 2015-12-22 DIAGNOSIS — M25612 Stiffness of left shoulder, not elsewhere classified: Secondary | ICD-10-CM

## 2015-12-22 DIAGNOSIS — M25512 Pain in left shoulder: Secondary | ICD-10-CM | POA: Diagnosis not present

## 2015-12-22 NOTE — Therapy (Signed)
[facility] [place], [place], [address] Phone: [phone]   Fax:  [phone]  Physical Therapy Treatment  Patient Details  Name: Ashley [name] MRN: [id number] Date of Birth: 01-10-1951 Referring Provider: Jerilynn Mages PA-C.  Encounter Date: 12/22/2015      PT End of Session - 12/22/15 1002    Visit Number 3   Number of Visits 12   Date for PT Re-Evaluation 01/26/16   PT Start Time 0953   PT Stop Time 1040   PT Time Calculation (min) 47 min      Past Medical History:  Diagnosis Date  . Depression   . Hyperlipidemia   . Hypertension     No past surgical history on file.  There were no vitals filed for this visit.      Subjective Assessment - 12/22/15 0958    Subjective LT shldr is doing better. 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   Currently in Pain? Yes   Pain Score 2    Pain Location Shoulder   Pain Orientation Left   Pain Descriptors / Indicators Aching;Throbbing   Pain Type Acute pain   Pain Onset More than a month ago   Pain Frequency Constant                         [facility] Adult PT Treatment/Exercise - 12/22/15 0001      Exercises   Exercises Shoulder     Shoulder Exercises: Standing   Horizontal ABduction Strengthening;Both;20 reps;Theraband;10 reps   Theraband Level (Shoulder Horizontal ABduction) Level 1 (Yellow)   External Rotation Strengthening;20 reps;Theraband;10 reps;Left   Theraband Level (Shoulder External Rotation) Level 1 (Yellow)   Other Standing Exercises UE ranger standing Flexion and circles each way 3x10     Modalities   Modalities Electrical Stimulation;Moist Heat     Moist Heat Therapy   Number Minutes Moist Heat 15 Minutes   Moist Heat Location Shoulder     Electrical Stimulation   Electrical Stimulation Location L shoulder premod x15 mins 80-150 hz   Electrical  Stimulation Goals Pain     Manual Therapy   Manual Therapy Passive ROM   Passive ROM PROM of L shoulder into flex/ER with gentle holds at end range                     PT Long Term Goals - 12/15/15 1218      PT LONG TERM GOAL #1   Title Independent with a HEP.   Time 4   Period Weeks   Status New     PT LONG TERM GOAL #2   Title Active left shoulder flexion to 145 degrees so the patient can easily reach overhead   Time 4   Period Weeks   Status New     PT LONG TERM GOAL #3   Title Active ER to 70 degrees+ to allow for easily donning/doffing of apparel   Time 4   Period Weeks   Status New     PT LONG TERM GOAL #4   Title Increase ROM so patient is able to reach behind back to L3.   Time 4   Period Weeks   Status New     PT LONG TERM GOAL #5   Title Increase left shoulder strength to a solid 4+/5 to increase stability  for performance of functional activities   Time 4   Period Weeks   Status New     PT LONG TERM GOAL #6   Title Perform ADL's with left shoulder pain not > 3/10.   Time 4   Period Weeks   Status New               Plan - 12/22/15 1003    Clinical Impression Statement Pt has responded very well to Rx with less pain in LT shldr now. She was able to perform more ADLs this weekend with less pain. She  was able to reach PROM 135 degrees in flexion, 40 ER and 80 degrees IR.   Rehab Potential Good   PT Frequency 3x / week   PT Duration 4 weeks   PT Treatment/Interventions ADLs/Self Care Home Management;Electrical Stimulation;Ultrasound;Therapeutic activities;Therapeutic exercise;Neuromuscular re-education;Patient/family education;Passive range of motion;Manual techniques   PT Next Visit Plan Posterior capsular stretch; anterior capsular stretch; Pulleys; UE Ranger; PROM; modalites PRN.   Consulted and Agree with Plan of Care Patient      Patient will benefit from skilled therapeutic intervention in order to improve the following  deficits and impairments:  Pain, Decreased activity tolerance, Decreased range of motion, Decreased strength  Visit Diagnosis: Acute pain of left shoulder  Stiffness of left shoulder, not elsewhere classified     Problem List Patient Active Problem List   Diagnosis Date Noted  . Severe obesity (BMI >= 40) ([place]) 07/30/2014  . Hyperlipidemia 05/31/2010  . Hypertension 05/31/2010  . Diabetes ([place]) 05/31/2010  . Peripheral edema 05/31/2010    RAMSEUR,CHRIS, PTA 12/22/2015, 11:35 AM  [facility] [place], [place], [address] Phone: [phone]   Fax:  [phone]  Name: Ashley [name] MRN: [id number] Date of Birth: Apr 12, 1950

## 2015-12-25 ENCOUNTER — Ambulatory Visit: Payer: BLUE CROSS/BLUE SHIELD | Admitting: Physical Therapy

## 2015-12-25 DIAGNOSIS — M25512 Pain in left shoulder: Secondary | ICD-10-CM

## 2015-12-25 DIAGNOSIS — M25612 Stiffness of left shoulder, not elsewhere classified: Secondary | ICD-10-CM

## 2015-12-25 NOTE — Therapy (Signed)
[facility] [address] [place], [place], [address] Phone: [phone]   Fax:  [phone]  Physical Therapy Treatment  Patient Details  Name: Ashley [name] MRN: [id number] Date of Birth: 1950-08-27 Referring Provider: Marciano Sequin PA-C.  Encounter Date: 12/25/2015      PT End of Session - 12/25/15 1257    PT Start Time 0900   PT Stop Time 0952   PT Time Calculation (min) 52 min   Activity Tolerance Patient tolerated treatment well   Behavior During Therapy [facility] for tasks assessed/performed      Past Medical History:  Diagnosis Date  . Depression   . Hyperlipidemia   . Hypertension     No past surgical history on file.  There were no vitals filed for this visit.      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            [facility] PT Assessment - 12/25/15 0001      AROM   Overall AROM Comments Left shoulder flexion= 104 degrees and ER= 44 degrees.                     [facility] Adult PT Treatment/Exercise - 12/25/15 0001      Shoulder Exercises: Standing   Other Standing Exercises UE Ranger on walll x 4 minutes.     Shoulder Exercises: Pulleys   Flexion Limitations 6 minutes.   Other Pulley Exercises --     Moist Heat Therapy   Number Minutes Moist Heat 15 Minutes   Moist Heat Location --  LT SHLD.     Emergency planning/management officer --  LT SHLD.   Electrical Stimulation Action IFC   Electrical Stimulation Parameters 80-150 HZ x 15 minutes.   Electrical Stimulation Goals Pain     Manual Therapy   Manual Therapy Passive ROM   Passive ROM PROM into left shoulder flexion and ER x 14 minutes.                     PT Long Term Goals - 12/15/15 1218      PT LONG TERM GOAL #1   Title Independent with a HEP.   Time 4   Period Weeks   Status New     PT LONG TERM GOAL #2   Title Active left shoulder flexion to 145 degrees so the patient can easily reach overhead   Time 4   Period Weeks   Status New     PT LONG TERM GOAL #3   Title Active ER to 70 degrees+ to allow for easily donning/doffing of apparel   Time 4   Period Weeks   Status New     PT LONG TERM GOAL #4   Title Increase ROM so patient is able to reach behind back to L3.   Time 4   Period Weeks   Status New     PT LONG TERM GOAL #5   Title Increase left shoulder strength to a solid 4+/5 to increase stability for performance of functional activities   Time 4   Period Weeks   Status New     PT LONG TERM GOAL #6   Title Perform ADL's with left shoulder pain not > 3/10.   Time 4   Period Weeks   Status New  Plan - 12/25/15 1301    Clinical Impression Statement Good progress this week with decreasing pain and increased left shoulder range of motion.      Patient will benefit from skilled therapeutic intervention in order to improve the following deficits and impairments:  Pain, Decreased activity tolerance, Decreased range of motion, Decreased strength  Visit Diagnosis: Acute pain of left shoulder  Stiffness of left shoulder, not elsewhere classified     Problem List Patient Active Problem List   Diagnosis Date Noted  . Severe obesity (BMI >= 40) ([facility]) 07/30/2014  . Hyperlipidemia 05/31/2010  . Hypertension 05/31/2010  . Diabetes ([facility]) 05/31/2010  . Peripheral edema 05/31/2010    Kaysia Willard, [place] 12/25/2015, 1:06 PM  [facility] [address] [place], [place], [address] Phone: [phone]   Fax:  [phone]  Name: Samnang Pili MRN: [id number] Date of Birth: 1950/08/23

## 2015-12-28 ENCOUNTER — Ambulatory Visit: Payer: BLUE CROSS/BLUE SHIELD | Admitting: *Deleted

## 2015-12-28 DIAGNOSIS — M25512 Pain in left shoulder: Secondary | ICD-10-CM | POA: Diagnosis not present

## 2015-12-28 DIAGNOSIS — M25612 Stiffness of left shoulder, not elsewhere classified: Secondary | ICD-10-CM

## 2015-12-28 NOTE — Therapy (Signed)
[facility] [place], [place], [address] Phone: [phone]   Fax:  [phone]  Physical Therapy Treatment  Patient Details  Name: Taralyn Marmion MRN: [id number] Date of Birth: Aug 29, 1950 Referring Provider: Jerilynn Mages PA-C.  Encounter Date: 12/28/2015      PT End of Session - 12/28/15 0901    Visit Number 5   Number of Visits 12   Date for PT Re-Evaluation 01/26/16   PT Start Time 0900   PT Stop Time 0950   PT Time Calculation (min) 50 min      Past Medical History:  Diagnosis Date  . Depression   . Hyperlipidemia   . Hypertension     No past surgical history on file.  There were no vitals filed for this visit.      Subjective Assessment - 12/28/15 0919    Subjective My pain is pretty low. 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   Currently in Pain? Yes   Pain Score 2    Pain Location Shoulder   Pain Orientation Left   Pain Descriptors / Indicators Sore   Pain Type Acute pain   Pain Onset More than a month ago   Pain Frequency Intermittent                         [facility] Adult PT Treatment/Exercise - 12/28/15 0001      Shoulder Exercises: Standing   External Rotation Strengthening;20 reps;Theraband;10 reps;Left   Theraband Level (Shoulder External Rotation) Level 1 (Yellow)   Internal Rotation Strengthening;Left;10 reps;20 reps;Theraband   Theraband Level (Shoulder Internal Rotation) Level 1 (Yellow)   Other Standing Exercises UE Ranger on walll x 6 minutes.     Shoulder Exercises: Pulleys   Flexion Limitations 6 minutes.     Modalities   Modalities Electrical Stimulation;Moist Heat     Moist Heat Therapy   Number Minutes Moist Heat 15 Minutes   Moist Heat Location --  LT SHLD.     Acupuncturist Location L shoulder premod x15 mins 80-150 hz   Electrical Stimulation Goals Pain     Manual  Therapy   Manual Therapy Passive ROM   Passive ROM PROM of L shoulder into flex/ER with gentle holds at end range                     PT Long Term Goals - 12/15/15 1218      PT LONG TERM GOAL #1   Title Independent with a HEP.   Time 4   Period Weeks   Status New     PT LONG TERM GOAL #2   Title Active left shoulder flexion to 145 degrees so the patient can easily reach overhead   Time 4   Period Weeks   Status New     PT LONG TERM GOAL #3   Title Active ER to 70 degrees+ to allow for easily donning/doffing of apparel   Time 4   Period Weeks   Status New     PT LONG TERM GOAL #4   Title Increase ROM so patient is able to reach behind back to L3.   Time 4   Period Weeks   Status New     PT LONG TERM GOAL #5   Title Increase left shoulder strength to a solid 4+/5 to increase stability for performance  of functional activities   Time 4   Period Weeks   Status New     PT LONG TERM GOAL #6   Title Perform ADL's with left shoulder pain not > 3/10.   Time 4   Period Weeks   Status New               Plan - 12/28/15 0914    Clinical Impression Statement Pt arrived today at clinic with 2/10 pain LT shldr and is doing much better (50% better). She has been able to return to ADL's with minimal pain now. She did well with Tband Exs and was given an HEP and yellow tband.. Normal response to  modalities    Rehab Potential Good   PT Frequency 3x / week   PT Duration 4 weeks   PT Treatment/Interventions ADLs/Self Care Home Management;Electrical Stimulation;Ultrasound;Therapeutic activities;Therapeutic exercise;Neuromuscular re-education;Patient/family education;Passive range of motion;Manual techniques   PT Next Visit Plan Posterior capsular stretch; anterior capsular stretch; Pulleys; UE Ranger; PROM; modalites PRN.   Possible DC after next visit as per Pt.   Consulted and Agree with Plan of Care Patient      Patient will benefit from skilled therapeutic  intervention in order to improve the following deficits and impairments:  Pain, Decreased activity tolerance, Decreased range of motion, Decreased strength  Visit Diagnosis: Acute pain of left shoulder  Stiffness of left shoulder, not elsewhere classified     Problem List Patient Active Problem List   Diagnosis Date Noted  . Severe obesity (BMI >= 40) ([place]) 07/30/2014  . Hyperlipidemia 05/31/2010  . Hypertension 05/31/2010  . Diabetes ([place]) 05/31/2010  . Peripheral edema 05/31/2010    RAMSEUR,CHRIS, PTA 12/28/2015, 9:58 AM  [facility] [address] [place], [place], [address] Phone: [phone]   Fax:  [phone]  Name: Dallis Gorelik MRN: [id number] Date of Birth: 18-Jan-1951

## 2015-12-30 ENCOUNTER — Encounter: Payer: BLUE CROSS/BLUE SHIELD | Admitting: Physical Therapy

## 2016-01-06 ENCOUNTER — Ambulatory Visit: Payer: BLUE CROSS/BLUE SHIELD | Admitting: Physical Therapy

## 2016-01-06 DIAGNOSIS — M25512 Pain in left shoulder: Secondary | ICD-10-CM | POA: Diagnosis not present

## 2016-01-06 DIAGNOSIS — M25612 Stiffness of left shoulder, not elsewhere classified: Secondary | ICD-10-CM

## 2016-01-06 NOTE — Therapy (Addendum)
[facility] [place], [place], [address] Phone: [phone]   Fax:  [phone]  Physical Therapy Treatment  Patient Details  Name: Jenese Mischke MRN: [id number] Date of Birth: 22-Jul-1950 Referring Provider: Jerilynn Mages PA-C.  Encounter Date: 01/06/2016    Past Medical History:  Diagnosis Date  . Depression   . Hyperlipidemia   . Hypertension     No past surgical history on file.  There were no vitals filed for this visit.                                    PT Long Term Goals - 12/15/15 1218      PT LONG TERM GOAL #1   Title Independent with a HEP.   Time 4   Period Weeks   Status New     PT LONG TERM GOAL #2   Title Active left shoulder flexion to 145 degrees so the patient can easily reach overhead   Time 4   Period Weeks   Status New     PT LONG TERM GOAL #3   Title Active ER to 70 degrees+ to allow for easily donning/doffing of apparel   Time 4   Period Weeks   Status New     PT LONG TERM GOAL #4   Title Increase ROM so patient is able to reach behind back to L3.   Time 4   Period Weeks   Status New     PT LONG TERM GOAL #5   Title Increase left shoulder strength to a solid 4+/5 to increase stability for performance of functional activities   Time 4   Period Weeks   Status New     PT LONG TERM GOAL #6   Title Perform ADL's with left shoulder pain not > 3/10.   Time 4   Period Weeks   Status New             Patient will benefit from skilled therapeutic intervention in order to improve the following deficits and impairments:  Pain, Decreased activity tolerance, Decreased range of motion, Decreased strength  Visit Diagnosis: Acute pain of left shoulder  Stiffness of left shoulder, not elsewhere classified     Problem List Patient Active Problem List   Diagnosis Date Noted  . Severe obesity (BMI >= 40) ([place]) 07/30/2014  . Hyperlipidemia  05/31/2010  . Hypertension 05/31/2010  . Diabetes ([place]) 05/31/2010  . Peripheral edema 05/31/2010    Akashdeep Chuba, [place] MPT 01/29/2016, 2:35 PM  [facility] [address] [place], [place], [address] Phone: [phone]   Fax:  [phone]  Name: Laverle Pillard MRN: [id number] Date of Birth: 13-Dec-1950  PHYSICAL THERAPY DISCHARGE SUMMARY  Visits from Start of Care:   Current functional level related to goals / functional outcomes: See above.   Remaining deficits: Continued loss of left shoulder ROM but patient and Dr. Yvone Neu with progress.   Education / Equipment: HEP.  Plan: Patient agrees to discharge.  Patient goals were not met. Patient is being discharged due to the physician's request.  ?????         [place] Murray Guzzetta MPT

## 2016-02-27 ENCOUNTER — Other Ambulatory Visit: Payer: Self-pay | Admitting: Nurse Practitioner

## 2016-02-27 DIAGNOSIS — E119 Type 2 diabetes mellitus without complications: Secondary | ICD-10-CM

## 2016-02-27 DIAGNOSIS — E785 Hyperlipidemia, unspecified: Secondary | ICD-10-CM

## 2016-02-27 DIAGNOSIS — I10 Essential (primary) hypertension: Secondary | ICD-10-CM

## 2016-02-29 NOTE — Telephone Encounter (Signed)
Patient NTBS for follow up and lab work  

## 2016-03-01 ENCOUNTER — Other Ambulatory Visit: Payer: Self-pay

## 2016-03-01 MED ORDER — OSELTAMIVIR PHOSPHATE 75 MG PO CAPS: 75.0000 mg | ORAL_CAPSULE | Freq: Every day | ORAL | 0 refills | Status: AC

## 2016-03-01 NOTE — Telephone Encounter (Signed)
All of grandchildren have been diagnosed with the flu. Sending in tamiflu prophylactic to pharmacy

## 2016-04-08 ENCOUNTER — Other Ambulatory Visit: Payer: Self-pay | Admitting: Nurse Practitioner

## 2016-04-08 DIAGNOSIS — I10 Essential (primary) hypertension: Secondary | ICD-10-CM

## 2016-04-19 ENCOUNTER — Other Ambulatory Visit: Payer: Self-pay | Admitting: Nurse Practitioner

## 2016-04-19 DIAGNOSIS — I10 Essential (primary) hypertension: Secondary | ICD-10-CM

## 2016-04-20 ENCOUNTER — Telehealth: Payer: Self-pay | Admitting: Nurse Practitioner

## 2016-04-28 NOTE — Telephone Encounter (Signed)
Denied.

## 2016-05-04 ENCOUNTER — Ambulatory Visit (INDEPENDENT_AMBULATORY_CARE_PROVIDER_SITE_OTHER): Payer: BLUE CROSS/BLUE SHIELD | Admitting: Nurse Practitioner

## 2016-05-04 ENCOUNTER — Encounter: Payer: Self-pay | Admitting: Nurse Practitioner

## 2016-05-04 VITALS — BP 112/70 | HR 60 | Temp 97.0°F | Ht 64.0 in | Wt 268.0 lb

## 2016-05-04 DIAGNOSIS — F411 Generalized anxiety disorder: Secondary | ICD-10-CM | POA: Diagnosis not present

## 2016-05-04 DIAGNOSIS — E785 Hyperlipidemia, unspecified: Secondary | ICD-10-CM

## 2016-05-04 DIAGNOSIS — E119 Type 2 diabetes mellitus without complications: Secondary | ICD-10-CM

## 2016-05-04 DIAGNOSIS — R609 Edema, unspecified: Secondary | ICD-10-CM | POA: Diagnosis not present

## 2016-05-04 DIAGNOSIS — E7800 Pure hypercholesterolemia, unspecified: Secondary | ICD-10-CM

## 2016-05-04 DIAGNOSIS — I10 Essential (primary) hypertension: Secondary | ICD-10-CM

## 2016-05-04 LAB — BAYER DCA HB A1C WAIVED: HB A1C (BAYER DCA - WAIVED): 7.3 % — ABNORMAL HIGH (ref <7.0)

## 2016-05-04 MED ORDER — AMLODIPINE BESYLATE 5 MG PO TABS: 5.0000 mg | ORAL_TABLET | Freq: Every day | ORAL | 1 refills | Status: DC

## 2016-05-04 MED ORDER — FUROSEMIDE 40 MG PO TABS: ORAL_TABLET | ORAL | 1 refills | Status: DC

## 2016-05-04 MED ORDER — METFORMIN HCL 1000 MG PO TABS
1000.0000 mg | ORAL_TABLET | Freq: Two times a day (BID) | ORAL | 0 refills | Status: DC
Start: 2016-05-04 — End: 2016-08-28

## 2016-05-04 MED ORDER — QUINAPRIL HCL 40 MG PO TABS: 40.0000 mg | ORAL_TABLET | Freq: Every day | ORAL | 1 refills | Status: DC

## 2016-05-04 MED ORDER — CLONIDINE HCL 0.2 MG PO TABS: 0.2000 mg | ORAL_TABLET | Freq: Two times a day (BID) | ORAL | 0 refills | Status: DC

## 2016-05-04 MED ORDER — ALPRAZOLAM 0.25 MG PO TABS: 0.2500 mg | ORAL_TABLET | Freq: Every day | ORAL | 0 refills | Status: DC | PRN

## 2016-05-04 MED ORDER — DILTIAZEM HCL ER COATED BEADS 120 MG PO CP24: 120.0000 mg | ORAL_CAPSULE | Freq: Every day | ORAL | 0 refills | Status: DC

## 2016-05-04 MED ORDER — METOPROLOL TARTRATE 100 MG PO TABS: ORAL_TABLET | ORAL | 1 refills | Status: DC

## 2016-05-04 MED ORDER — SIMVASTATIN 40 MG PO TABS: 40.0000 mg | ORAL_TABLET | Freq: Every evening | ORAL | 0 refills | Status: DC

## 2016-05-04 NOTE — Progress Notes (Signed)
Subjective:    Patient ID: Ashley [name], female    DOB: 07/30/1950, 66 y.o.   MRN: [id number]  Patient here today for follow up of chronic medical problems. No changes since last visit. NO complaints today  Outpatient Encounter Prescriptions as of 09/07/2015  Medication Sig  . amLODipine (NORVASC) 5 MG tablet TAKE 1 TABLET EVERY DAY  . cloNIDine (CATAPRES) 0.2 MG tablet Take 1 tablet (0.2 mg total) by mouth 2 (two) times daily.  . diltiazem (CARTIA XT) 120 MG 24 hr capsule Take 1 capsule (120 mg total) by mouth daily.  . furosemide (LASIX) 40 MG tablet 1 po qam and 1/2 po in afternoon (Patient taking differently: as needed. 1 po qam and 1/2 po in afternoon)  . metFORMIN (GLUCOPHAGE) 1000 MG tablet TAKE 1 TABLET TWICE A DAY WITH A MEAL  . metoprolol (LOPRESSOR) 100 MG tablet TAKE TWO TABLETS BY MOUTH IN THE MORNING AND ONE AT BEDTIME  . quinapril (ACCUPRIL) 40 MG tablet Take 1 tablet (40 mg total) by mouth daily.  . simvastatin (ZOCOR) 40 MG tablet Take 1 tablet (40 mg total) by mouth every evening.  . triamcinolone cream (KENALOG) 0.1 % Apply 1 application topically 2 (two) times daily.   * patients husband is dying of cancer and she hs done well with it up to this oint, but now she says she needs something to help with her nerves.  Hypertension  This is a chronic problem. The current episode started more than 1 year ago. The problem is controlled. Pertinent negatives include no chest pain, headaches, neck pain, palpitations or shortness of breath. Risk factors for coronary artery disease include dyslipidemia and post-menopausal state. Past treatments include ACE inhibitors, beta blockers and diuretics. The current treatment provides moderate improvement. Compliance problems include diet and exercise.  There is no history of kidney disease, CAD/MI, CVA or retinopathy.  Hyperlipidemia  This is a chronic problem. The current episode started more than 1 year ago. The problem is uncontrolled. Recent  lipid tests were reviewed and are normal. Exacerbating diseases include diabetes. She has no history of hypothyroidism. Pertinent negatives include no chest pain or shortness of breath. Current antihyperlipidemic treatment includes statins. The current treatment provides moderate improvement of lipids. Compliance problems include adherence to diet and adherence to exercise.  Risk factors for coronary artery disease include dyslipidemia, diabetes mellitus, hypertension and post-menopausal.  Diabetes  She presents for her follow-up diabetic visit. She has type 2 diabetes mellitus. Pertinent negatives for hypoglycemia include no headaches. Pertinent negatives for diabetes include no chest pain, no polydipsia, no polyphagia, no polyuria and no weakness. Pertinent negatives for diabetic complications include no CVA or retinopathy. Risk factors for coronary artery disease include dyslipidemia, obesity and post-menopausal. She is compliant with treatment some of the time. Her weight is stable. She is following a generally healthy diet. When asked about meal planning, she reported none. She has not had a previous visit with a dietitian. She rarely participates in exercise. Home blood sugar record trend: patient has not been checking blood sugars. An ACE inhibitor/angiotensin II receptor blocker is being taken. She does not see a podiatrist.Eye exam is current.  peripheral edema Swelling no better- legs stay swollen all the time despite taking lasix.    Review of Systems  Constitutional: Negative.   HENT: Negative.   Respiratory: Negative for shortness of breath.   Cardiovascular: Positive for leg swelling. Negative for chest pain and palpitations.  Endocrine: Negative for polydipsia, polyphagia and polyuria.    Genitourinary: Negative.   Musculoskeletal: Negative for neck pain.  Neurological: Negative.  Negative for weakness and headaches.  Psychiatric/Behavioral: Negative.   All other systems reviewed and  are negative.      Objective:   Physical Exam  Constitutional: She is oriented to person, place, and time. She appears well-developed and well-nourished.  HENT:  Nose: Nose normal.  Mouth/Throat: Oropharynx is clear and moist.  Eyes: EOM are normal.  Neck: Trachea normal, normal range of motion and full passive range of motion without pain. Neck supple. No JVD present. Carotid bruit is not present. No thyromegaly present.  Cardiovascular: Normal rate, regular rhythm, normal heart sounds and intact distal pulses.  Exam reveals no gallop and no friction rub.   No murmur heard. Pulmonary/Chest: Effort normal and breath sounds normal.  Abdominal: Soft. Bowel sounds are normal. She exhibits no distension and no mass. There is no tenderness.  pendulous abdomen  Musculoskeletal: Normal range of motion. She exhibits no edema.  Lymphadenopathy:    She has no cervical adenopathy.  Neurological: She is alert and oriented to person, place, and time. She has normal reflexes.  Skin: Skin is warm and dry.  Psychiatric: She has a normal mood and affect. Her behavior is normal. Judgment and thought content normal.   BP 112/70   Pulse 60   Temp 97 F (36.1 C) (Oral)   Ht 5' 4" (1.626 m)   Wt 268 lb (121.6 kg)   BMI 46.00 kg/m    hgba1c- 7.3%      Assessment & Plan:  1. Type 2 diabetes mellitus without complication, without long-term current use of insulin (HCC) Watch carbs in diet - Bayer DCA Hb A1c Waived - CMP14+EGFR - metFORMIN (GLUCOPHAGE) 1000 MG tablet; Take 1 tablet (1,000 mg total) by mouth 2 (two) times daily with a meal.  Dispense: 180 tablet; Refill: 0  2. Hyperlipidemia, unspecified hyperlipidemia type Low fat diet - Lipid panel - simvastatin (ZOCOR) 40 MG tablet; Take 1 tablet (40 mg total) by mouth every evening.  Dispense: 90 tablet; Refill: 0  3. Essential hypertension Low sodium - amLODipine (NORVASC) 5 MG tablet; Take 1 tablet (5 mg total) by mouth daily.  Dispense:  90 tablet; Refill: 1 - diltiazem (CARDIZEM CD) 120 MG 24 hr capsule; Take 1 capsule (120 mg total) by mouth daily.  Dispense: 90 capsule; Refill: 0 - metoprolol (LOPRESSOR) 100 MG tablet; TAKE TWO TABLETS BY MOUTH IN THE MORNING AND ONE AT BEDTIME  Dispense: 270 tablet; Refill: 1 - quinapril (ACCUPRIL) 40 MG tablet; Take 1 tablet (40 mg total) by mouth daily.  Dispense: 90 tablet; Refill: 1 - cloNIDine (CATAPRES) 0.2 MG tablet; Take 1 tablet (0.2 mg total) by mouth 2 (two) times daily.  Dispense: 180 tablet; Refill: 0  4. Peripheral edema Elevate legs when sitting - furosemide (LASIX) 40 MG tablet; 1 po qam and 1/2 po in afternoon  Dispense: 135 tablet; Refill: 1  5. Severe obesity (BMI >= 40) (HCC) Discussed diet and exercise for person with BMI >25 Will recheck weight in 3-6 months   6. GAD (generalized anxiety disorder) Grief counseling encouraged - ALPRAZolam (XANAX) 0.25 MG tablet; Take 1 tablet (0.25 mg total) by mouth daily as needed for anxiety.  Dispense: 30 tablet; Refill: 0    Labs pending Health maintenance reviewed Diet and exercise encouraged Continue all meds Follow up  In 3 months   [place], FNP

## 2016-05-04 NOTE — Patient Instructions (Signed)

## 2016-05-05 LAB — CMP14+EGFR
ALBUMIN: 4 g/dL (ref 3.6–4.8)
ALT: 21 IU/L (ref 0–32)
AST: 29 IU/L (ref 0–40)
Albumin/Globulin Ratio: 1.4 (ref 1.2–2.2)
Alkaline Phosphatase: 48 IU/L (ref 39–117)
BUN / CREAT RATIO: 33 — AB (ref 12–28)
BUN: 19 mg/dL (ref 8–27)
Bilirubin Total: 0.5 mg/dL (ref 0.0–1.2)
CALCIUM: 9.3 mg/dL (ref 8.7–10.3)
CO2: 24 mmol/L (ref 18–29)
Chloride: 99 mmol/L (ref 96–106)
Creatinine, Ser: 0.58 mg/dL (ref 0.57–1.00)
GFR calc Af Amer: 113 mL/min/{1.73_m2} (ref >59)
GFR, EST NON AFRICAN AMERICAN: 98 mL/min/{1.73_m2} (ref >59)
GLUCOSE: 140 mg/dL — AB (ref 65–99)
Globulin, Total: 2.9 g/dL (ref 1.5–4.5)
Potassium: 5.1 mmol/L (ref 3.5–5.2)
Sodium: 141 mmol/L (ref 134–144)
Total Protein: 6.9 g/dL (ref 6.0–8.5)

## 2016-05-05 LAB — LIPID PANEL
CHOL/HDL RATIO: 3 ratio (ref 0.0–4.4)
Cholesterol, Total: 124 mg/dL (ref 100–199)
HDL: 41 mg/dL (ref >39)
LDL CALC: 54 mg/dL (ref 0–99)
Triglycerides: 146 mg/dL (ref 0–149)
VLDL CHOLESTEROL CAL: 29 mg/dL (ref 5–40)

## 2016-05-29 ENCOUNTER — Other Ambulatory Visit: Payer: Self-pay | Admitting: Family

## 2016-05-29 DIAGNOSIS — I10 Essential (primary) hypertension: Secondary | ICD-10-CM

## 2016-07-14 ENCOUNTER — Ambulatory Visit (INDEPENDENT_AMBULATORY_CARE_PROVIDER_SITE_OTHER): Payer: BLUE CROSS/BLUE SHIELD | Admitting: Nurse Practitioner

## 2016-07-14 ENCOUNTER — Encounter: Payer: Self-pay | Admitting: Nurse Practitioner

## 2016-07-14 VITALS — BP 147/79 | HR 54 | Temp 96.8°F | Ht 64.0 in | Wt 268.0 lb

## 2016-07-14 DIAGNOSIS — S90862A Insect bite (nonvenomous), left foot, initial encounter: Secondary | ICD-10-CM | POA: Diagnosis not present

## 2016-07-14 DIAGNOSIS — F5101 Primary insomnia: Secondary | ICD-10-CM | POA: Diagnosis not present

## 2016-07-14 DIAGNOSIS — W57XXXA Bitten or stung by nonvenomous insect and other nonvenomous arthropods, initial encounter: Secondary | ICD-10-CM

## 2016-07-14 MED ORDER — SULFAMETHOXAZOLE-TRIMETHOPRIM 800-160 MG PO TABS: 1.0000 | ORAL_TABLET | Freq: Two times a day (BID) | ORAL | 0 refills | Status: DC

## 2016-07-14 MED ORDER — ZOLPIDEM TARTRATE 5 MG PO TABS: 5.0000 mg | ORAL_TABLET | Freq: Every evening | ORAL | 1 refills | Status: DC | PRN

## 2016-07-14 NOTE — Patient Instructions (Signed)

## 2016-07-14 NOTE — Progress Notes (Signed)
   Subjective:    Patient ID: Ashley [name], female    DOB: [birth year], 66 y.o.   MRN: [id number]  HPI  Patient come sin today with 2 complaints: - her husband died 2  Months ag and she has not been able to sleep at night. SHe says that she will just lay ib the bed ad stare at the ceiling for hours. She has tried some OTC meds that have not helped. She has xanax 0.25 dose but she does nit take it. - bite on dorsal  surface of left foot. redness has gotten bigger since she noticed it on Saturday evening   Review of Systems  Constitutional: Negative.   Respiratory: Negative.   Cardiovascular: Negative.   Skin:       Bug bite  Neurological: Negative.   Psychiatric/Behavioral: Positive for sleep disturbance.  All other systems reviewed and are negative.      Objective:   Physical Exam  Constitutional: She is oriented to person, place, and time. She appears well-developed and well-nourished. No distress.  Cardiovascular: Normal rate and regular rhythm.   Pulmonary/Chest: Effort normal and breath sounds normal.  Neurological: She is alert and oriented to person, place, and time.  Skin: Skin is warm and dry.  7cm erythematous indurated area with central punctate area- no drainage  Psychiatric: She has a normal mood and affect. Her behavior is normal. Judgment and thought content normal.   BP (!) 147/79   Pulse (!) 54   Temp (!) 96.8 F (36 C) (Oral)   Ht 5\' 4"  (1.626 m)   Wt 268 lb (121.6 kg)   BMI 46.00 kg/m      Assessment & Plan:  1. Bug bite, initial encounter Warm epsom salt soaks Do not pick or scratch - sulfamethoxazole-trimethoprim (BACTRIM DS) 800-160 MG tablet; Take 1 tablet by mouth 2 (two) times daily.  Dispense: 14 tablet; Refill: 0  2. Primary insomnia Bedtime routine - zolpidem (AMBIEN) 5 MG tablet; Take 1 tablet (5 mg total) by mouth at bedtime as needed for sleep.  Dispense: 15 tablet; Refill: [place], FNP

## 2016-07-19 ENCOUNTER — Encounter: Payer: Self-pay | Admitting: Physician Assistant

## 2016-07-19 ENCOUNTER — Ambulatory Visit (INDEPENDENT_AMBULATORY_CARE_PROVIDER_SITE_OTHER): Payer: BLUE CROSS/BLUE SHIELD | Admitting: Physician Assistant

## 2016-07-19 ENCOUNTER — Ambulatory Visit (INDEPENDENT_AMBULATORY_CARE_PROVIDER_SITE_OTHER): Payer: BLUE CROSS/BLUE SHIELD

## 2016-07-19 VITALS — BP 102/59 | HR 48 | Temp 97.0°F | Ht 64.0 in | Wt 266.4 lb

## 2016-07-19 DIAGNOSIS — M25522 Pain in left elbow: Secondary | ICD-10-CM

## 2016-07-19 NOTE — Patient Instructions (Signed)
Elbow and Forearm Exercises Ask your health care provider which exercises are safe for you. Do exercises exactly as told by your health care provider and adjust them as directed. It is normal to feel mild stretching, pulling, tightness, or discomfort as you do these exercises, but you should stop right away if you feel sudden pain or your pain gets worse.Do not begin these exercises until told by your health care provider. RANGE OF MOTION EXERCISES These exercises warm up your muscles and joints and improve the movement and flexibility of your injured elbow and forearm. These exercises also help to relieve pain, numbness, and tingling.These exercises are done using the muscles in your injured elbow and forearm. Exercise A: Elbow Flexion, Active 1. Hold your left / right arm at your side, and bend your elbow as far as you can using your left / right arm muscles. 2. Hold this position for __________ seconds. 3. Slowly return to the starting position. Repeat __________ times. Complete this exercise __________ times a day. Exercise B: Elbow Extension, Active 1. Hold your left / right arm at your side, and straighten your elbow as much as you can using your left / right arm muscles. 2. Hold this position for __________ seconds. 3. Slowly return to the starting position. Repeat __________ times. Complete this exercise __________ times a day. Exercise C: Forearm Rotation, Supination, Active 1. Stand or sit with your elbows at your sides. 2. Bend your left / right elbow to an "L" shape (90 degrees). 3. Turn your palm upward until you feel a gentle stretch on the inside of your forearm. 4. Hold this position for __________ seconds. 5. Slowly release and return to the starting position. Repeat __________ times. Complete this exercise __________ times a day. Exercise D: Forearm Rotation, Pronation, Active 1. Stand or sit with your elbows at your side. 2. Bend your left / right elbow to an "L" shape (90  degrees). 3. Turn your left / right palm downward until you feel a gentle stretch on the top of your forearm. 4. Hold this position for __________ seconds. 5. Slowly release and return to the starting position. Repeat__________ times. Complete this exercise __________ times a day. STRETCHING EXERCISES These exercises warm up your muscles and joints and improve the movement and flexibility of your injured elbow and forearm. These exercises also help to relieve pain, numbness, and tingling.These exercises are done using your healthy elbow and forearm to help stretch the muscles in your injured elbow and forearm. Exercise E: Elbow Flexion, Active-Assisted  1. Hold your left / right arm at your side, and bend your elbow as much as you can using your left / right arm muscles. 2. Use your other hand to bend your left / right elbow farther. To do this, gently push up on your forearm until you feel a gentle stretch on the back of your elbow. 3. Hold this position for __________ seconds. 4. Slowly return to the starting position. Repeat __________ times. Complete this exercise __________ times a day. Exercise F: Elbow Extension, Active-Assisted  1. Hold your left / right arm at your side, and straighten your elbow as much as you can using your left / right arm muscles. 2. Use your other hand to straighten the left / right elbow farther. To do this, gently push down on your forearm until you feel a gentle stretch on the inside of your elbow. 3. Hold this position for __________ seconds. 4. Slowly return to the starting position. Repeat __________  times. Complete this exercise __________ times a day. Exercise G: Forearm Rotation, Supination, Active-Assisted  1. Sit with your left / right elbow bent in an "L" shape (90 degrees) with your forearm resting on a table. 2. Keeping your upper body and shoulder still, rotate your forearm so your left / right palm faces upward. 3. Use your other hand to help  rotate your forearm further until you feel a gentle to moderate stretch. 4. Hold this position for __________ seconds. 5. Slowly release the stretch and return to the starting position. Repeat __________ times. Complete this exercise __________ times a day. Exercise H: Forearm Rotation, Pronation, Active-Assisted  1. Sit with your left / right elbow bent in an "L" shape (90 degrees) with your forearm resting on a table. 2. Keeping your upper body and shoulder still, rotate your forearm so your palm faces the tabletop. 3. Use your other hand to help rotate your forearm further until you feel a gentle to moderate stretch. 4. Hold this position for __________ seconds. 5. Slowly release the stretch and return to the starting position. Repeat __________ times. Complete this exercise __________ times a day. Exercise I: Elbow Flexion, Supine, Passive 1. Lie on your back. 2. Extend your left / right arm up in the air, bracing it with your other hand. 3. Let your left / right your hand slowly lower toward your shoulder, while your elbow stays pointed toward the ceiling. You should feel a gentle stretch along the back of your upper arm and elbow. 4. If instructed by your health care provider, you may increase the intensity of your stretch by adding a small wrist weight or hand weight. 5. Hold this position for __________ seconds. 6. Slowly return to the starting position. Repeat __________ times. Complete this exercise __________ times a day. Exercise J: Elbow Extension, Supine, Passive  1. Lie on your back. Make sure that you are in a comfortable position that lets you relax your arm muscles. 2. Place a folded towel under your left / right upper arm so your elbow and shoulder are at the same height. Straighten your left / right arm so your elbow does not rest on the bed or towel. 3. Let the weight of your hand stretch your elbow. Keep your arm and chest muscles relaxed. You should feel a stretch on  the inside of your elbow. 4. If told by your health care provider, you may increase the intensity of your stretch by adding a small wrist weight or hand weight. 5. Hold this position for__________ seconds. 6. Slowly release the stretch. Repeat __________ times. Complete this exercise __________ times a day. STRENGTHENING EXERCISES These exercises build strength and endurance in your elbow and forearm. Endurance is the ability to use your muscles for a long time, even after they get tired. Exercise K: Elbow Flexion, Isometric  1. Stand or sit up straight. 2. Bend your left / right elbow in an "L" shape (90 degrees) and turn your palm up so your forearm is at the height of your waist. 3. Place your other hand on top of your forearm. Gently push down as your left / right arm resists. Push as hard as you can with both arms without causing any pain or movement at your left / right elbow. 4. Hold this position for __________ seconds. 5. Slowly release the tension in both arms. Let your muscles relax completely before repeating. Repeat __________ times. Complete this exercise __________ times a day. Exercise L: Elbow Extensors, Isometric  1. Stand or sit up straight. 2. Place your left / right arm so your palm faces your abdomen and it is at the height of your waist. 3. Place your other hand on the underside of your forearm. Gently push up as your left / right arm resists. Push as hard as you can with both arms, without causing any pain or movement at your left / right elbow. 4. Hold this position for __________ seconds. 5. Slowly release the tension in both arms. Let your muscles relax completely before repeating. Repeat __________ times. Complete this exercise __________ times a day. Exercise M: Elbow Flexion With Forearm Palm Up  1. Sit upright on a firm chair without armrests, or stand. 2. Place your left / right arm at your side with your palm facing forward. 3. Holding a __________weight  or gripping a rubber exercise band or tubing, bend your elbow to bring your hand toward your shoulder. 4. Hold this position for __________ seconds. 5. Slowly return to the starting position. Repeat __________times. Complete this exercise __________times a day. Exercise N: Elbow Extension  1. Sit on a firm chair without armrests, or stand. 2. Keeping your upper arms at your sides, bring both hands up toward your left / right shoulder while you grip a rubber exercise band or tubing. Your left / right hand should be just below the other hand. 3. Straighten your left / right elbow. 4. Hold this position for __________ seconds. 5. Control the resistance of the band or tubing as your hand returns to your side. Repeat __________times. Complete this exercise __________times a day. Exercise O: Forearm Rotation, Supination  1. Sit with your left / right forearm supported on a table. Keep your elbow at waist height. 2. Rest your hand over the edge of the table with your palm facing down. 3. Gently hold a lightweight hammer. 4. Without moving your elbow, slowly rotate your forearm to turn your palm and hand upward to a "thumbs-up" position. 5. Hold this position for __________ seconds. 6. Slowly return to the starting position. Repeat __________times. Complete this exercise __________times a day. Exercise P: Forearm Rotation, Pronation  1. Sit with your left / right forearm supported on a table. Keep your elbow below shoulder height. 2. Rest your hand over the edge of the table with your palm facing up. 3. Gently hold a lightweight hammer. 4. Without moving your elbow, slowly rotate your forearm to turn your palm and hand upward to a "thumbs-up" position. 5. Hold this position for __________seconds. 6. Slowly return to the starting position. Repeat __________times. Complete this exercise __________times a day. This information is not intended to replace advice given to you by your health care  provider. Make sure you discuss any questions you have with your health care provider. Document Released: 12/08/2004 Document Revised: 06/04/2015 Document Reviewed: 10/19/2014 Elsevier Interactive Patient Education  2018 [employer]  

## 2016-07-19 NOTE — Progress Notes (Signed)
Subjective:     Patient ID: Ashley [name], female   DOB: [birth year], 66 y.o.   MRN: [id number]  HPI Pt with pain and swelling to the L elbow States she was shoveling mulch out of the truck when she slipped coming down on the L side  Review of Systems Denies any numbness distal to the elbow + pain, swelling , and bruising to the elbow + Prev hx of fx to the elbow    Objective:   Physical Exam + ecchy to the lateral epicond. Region + TTP  Of th medial and lateral epicond. Decrease in ROM of the elbow due to sx- worse with full ext and pronation/supination Grip strength good Pulses/sensory good distal Xray- see labs    Assessment:     1. Left elbow pain        Plan:     Concern regarding possible fx Will place in sling Await over read of Xray Heat/Ice OTC meds for sx F/U pending results

## 2016-08-28 ENCOUNTER — Other Ambulatory Visit: Payer: Self-pay | Admitting: Nurse Practitioner

## 2016-08-28 DIAGNOSIS — E119 Type 2 diabetes mellitus without complications: Secondary | ICD-10-CM

## 2016-08-28 DIAGNOSIS — I10 Essential (primary) hypertension: Secondary | ICD-10-CM

## 2016-08-28 DIAGNOSIS — E7800 Pure hypercholesterolemia, unspecified: Secondary | ICD-10-CM

## 2016-08-30 ENCOUNTER — Encounter: Payer: Self-pay | Admitting: Nurse Practitioner

## 2016-08-30 ENCOUNTER — Ambulatory Visit (INDEPENDENT_AMBULATORY_CARE_PROVIDER_SITE_OTHER): Payer: BLUE CROSS/BLUE SHIELD | Admitting: Nurse Practitioner

## 2016-08-30 VITALS — BP 102/59 | HR 50 | Temp 97.6°F | Ht 64.0 in | Wt 266.0 lb

## 2016-08-30 DIAGNOSIS — Z024 Encounter for examination for driving license: Secondary | ICD-10-CM | POA: Diagnosis not present

## 2016-08-30 NOTE — Progress Notes (Signed)
   Subjective:    Patient ID: Ashley [name], female    DOB: May 20, 1950, 66 y.o.   MRN: [id number]  HPI  Patient comes in today to just have DMV form filled out. SHe was last seen for follow up on 05/04/16. SHe is doing well today without complaints   Review of Systems  Constitutional: Negative for activity change and appetite change.  HENT: Negative.   Eyes: Negative for pain.  Respiratory: Negative for shortness of breath.   Cardiovascular: Positive for leg swelling. Negative for chest pain and palpitations.  Gastrointestinal: Negative for abdominal pain.  Endocrine: Negative for polydipsia.  Genitourinary: Negative.   Skin: Negative for rash.  Neurological: Negative for dizziness, weakness and headaches.  Hematological: Does not bruise/bleed easily.  Psychiatric/Behavioral: Negative.   All other systems reviewed and are negative.      Objective:   Physical Exam No physical assessment done todat       Assessment & Plan:  1. Encounter for examination for driving license Reviewed history and filled out DMV form Patient will schedule follow up appointment  Mary-Margaret Hassell Done, FNP

## 2016-10-07 ENCOUNTER — Telehealth: Payer: Self-pay | Admitting: Nurse Practitioner

## 2016-10-07 ENCOUNTER — Encounter: Payer: Self-pay | Admitting: Family

## 2016-10-07 ENCOUNTER — Ambulatory Visit (INDEPENDENT_AMBULATORY_CARE_PROVIDER_SITE_OTHER): Payer: BLUE CROSS/BLUE SHIELD | Admitting: Family

## 2016-10-07 VITALS — BP 118/57 | HR 60 | Temp 97.4°F | Ht 64.0 in | Wt 268.0 lb

## 2016-10-07 DIAGNOSIS — L03116 Cellulitis of left lower limb: Secondary | ICD-10-CM

## 2016-10-07 MED ORDER — DOXYCYCLINE HYCLATE 100 MG PO TABS: 100.0000 mg | ORAL_TABLET | Freq: Two times a day (BID) | ORAL | 0 refills | Status: DC

## 2016-10-07 NOTE — Progress Notes (Signed)
   Subjective:    Patient ID: Ashley [name], female    DOB: 1950-10-07, 66 y.o.   MRN: [id number]  HPI Pt presents to the office today with left lower leg swelling, redness, and  Tenderness that start several days that is becoming worse. Pt states she has cellulitis in the past and feels like she is having another "flare up".   Pt taking Lasix 60 mg daily.   Review of Systems  Respiratory: Negative for shortness of breath.   Cardiovascular: Positive for leg swelling.  All other systems reviewed and are negative.      Objective:   Physical Exam  Constitutional: She is oriented to person, place, and time. She appears well-developed and well-nourished. No distress.  HENT:  Head: Normocephalic.  Cardiovascular: Normal rate, regular rhythm, normal heart sounds and intact distal pulses.   No murmur heard. Pulmonary/Chest: Effort normal and breath sounds normal. No respiratory distress. She has no wheezes.  Musculoskeletal: Normal range of motion. She exhibits edema (3+ in BLE, erytheams and warmth). She exhibits no tenderness.  Neurological: She is alert and oriented to person, place, and time.  Skin: Skin is warm and dry.  Psychiatric: She has a normal mood and affect. Her behavior is normal. Judgment and thought content normal.  Vitals reviewed.   BP (!) 118/57   Pulse 60   Temp (!) 97.4 F (36.3 C) (Oral)   Ht 5\' 4"  (1.626 m)   Wt 268 lb (121.6 kg)   BMI 46.00 kg/m        Assessment & Plan:  1. Cellulitis of left lower extremity Keep elevated Report any increase in redness, swelling, or fever Continue Lasix RTO prn  - doxycycline (VIBRA-TABS) 100 MG tablet; Take 1 tablet (100 mg total) by mouth 2 (two) times daily.  Dispense: 20 tablet; Refill: 0   Evelina Dun, FNP

## 2016-10-07 NOTE — Telephone Encounter (Signed)
appt scheduled Pt notified 

## 2016-10-07 NOTE — Addendum Note (Signed)
Addended by: Evelina Dun A on: 10/07/2016 05:34 PM   Modules accepted: Orders

## 2016-10-07 NOTE — Patient Instructions (Signed)

## 2016-10-12 ENCOUNTER — Telehealth: Payer: Self-pay | Admitting: Family

## 2016-10-12 MED ORDER — CEPHALEXIN 500 MG PO CAPS: 500.0000 mg | ORAL_CAPSULE | Freq: Four times a day (QID) | ORAL | 0 refills | Status: DC

## 2016-10-12 MED ORDER — CEPHALEXIN 500 MG PO CAPS: 500.0000 mg | ORAL_CAPSULE | Freq: Four times a day (QID) | ORAL | 0 refills | Status: AC

## 2016-10-12 NOTE — Telephone Encounter (Signed)
Pharm and pt aware of the pharm change

## 2016-10-12 NOTE — Telephone Encounter (Signed)
PT can stop doxycycline and sent in Keflex to pharmacy

## 2016-10-12 NOTE — Telephone Encounter (Signed)
What symptoms do you have? cellulitis  How long have you been sick? Last week  Have you been seen for this problem? Yes last Friday, and she put her on something that is not working wants to be put on what MMM has prescribed her in the past, does not remember the name of medication  If your provider decides to give you a prescription, which pharmacy would you like for it to be sent to? [facility]   Patient informed that this information will be sent to the clinical staff for review and that they should receive a follow up call.

## 2016-10-12 NOTE — Telephone Encounter (Signed)
Pt aware by detailed VM 

## 2016-10-14 ENCOUNTER — Other Ambulatory Visit: Payer: Self-pay | Admitting: Nurse Practitioner

## 2016-10-14 DIAGNOSIS — I10 Essential (primary) hypertension: Secondary | ICD-10-CM

## 2016-11-03 ENCOUNTER — Encounter: Payer: Self-pay | Admitting: Nurse Practitioner

## 2016-11-03 ENCOUNTER — Ambulatory Visit (INDEPENDENT_AMBULATORY_CARE_PROVIDER_SITE_OTHER): Payer: BLUE CROSS/BLUE SHIELD | Admitting: Nurse Practitioner

## 2016-11-03 VITALS — BP 146/72 | HR 55 | Temp 97.1°F | Ht 64.0 in | Wt 272.0 lb

## 2016-11-03 DIAGNOSIS — R0789 Other chest pain: Secondary | ICD-10-CM

## 2016-11-03 DIAGNOSIS — I4891 Unspecified atrial fibrillation: Secondary | ICD-10-CM

## 2016-11-03 DIAGNOSIS — Z23 Encounter for immunization: Secondary | ICD-10-CM

## 2016-11-03 DIAGNOSIS — F411 Generalized anxiety disorder: Secondary | ICD-10-CM | POA: Diagnosis not present

## 2016-11-03 MED ORDER — ALPRAZOLAM 0.25 MG PO TABS: 0.2500 mg | ORAL_TABLET | Freq: Every day | ORAL | 1 refills | Status: DC | PRN

## 2016-11-03 NOTE — Progress Notes (Signed)
   Subjective:    Patient ID: Ashley [name], female    DOB: [birth year], 66 y.o.   MRN: [id number]  HPI Patient comes in today c/o chest feeling tight with difficulty taking a deep breath. she says she has been doing ut for about 1 week. Usually resolves on its own. [place] ehas a history of gastric reflux but has not taken any meds iun years. Not sure if feels the same as that or not.    Review of Systems  Constitutional: Negative.   HENT: Negative.   Respiratory: Positive for shortness of breath.   Cardiovascular: Positive for chest pain (?).  Genitourinary: Negative.   Neurological: Negative.   Psychiatric/Behavioral: Negative.   All other systems reviewed and are negative.      Objective:   Physical Exam  Constitutional: She is oriented to person, place, and time. She appears well-developed and well-nourished.  Cardiovascular: Normal rate and regular rhythm.   Pulmonary/Chest: Effort normal and breath sounds normal.  Neurological: She is alert and oriented to person, place, and time.  Skin: Skin is warm.  Psychiatric: She has a normal mood and affect. Her behavior is normal. Judgment and thought content normal.   BP (!) 146/72   Pulse (!) 55   Temp (!) 97.1 F (36.2 C) (Oral)   Ht 5\' 4"  (1.626 m)   Wt 272 lb (123.4 kg)   SpO2 98%   BMI 46.69 kg/m   EKG- atrial Criss Alvine, FNP       Assessment & Plan:   1. Chest tightness or pressure   2. Atrial fibrillation, unspecified type [facility])    Take asiprin daily Referral made to cardiology RTO prn  Mary-Margaret Hassell Done, FNP

## 2016-11-03 NOTE — Patient Instructions (Signed)

## 2016-11-24 ENCOUNTER — Encounter: Payer: Self-pay | Admitting: Cardiovascular Disease

## 2016-11-28 ENCOUNTER — Other Ambulatory Visit: Payer: Self-pay | Admitting: Nurse Practitioner

## 2016-11-28 DIAGNOSIS — I10 Essential (primary) hypertension: Secondary | ICD-10-CM

## 2016-11-29 ENCOUNTER — Other Ambulatory Visit: Payer: Self-pay | Admitting: Nurse Practitioner

## 2016-11-29 DIAGNOSIS — I10 Essential (primary) hypertension: Secondary | ICD-10-CM

## 2016-11-29 DIAGNOSIS — E119 Type 2 diabetes mellitus without complications: Secondary | ICD-10-CM

## 2016-11-29 DIAGNOSIS — E7800 Pure hypercholesterolemia, unspecified: Secondary | ICD-10-CM

## 2016-12-16 LAB — HM MAMMOGRAPHY

## 2016-12-19 ENCOUNTER — Other Ambulatory Visit: Payer: Self-pay | Admitting: Nurse Practitioner

## 2016-12-19 DIAGNOSIS — E7800 Pure hypercholesterolemia, unspecified: Secondary | ICD-10-CM

## 2016-12-26 ENCOUNTER — Ambulatory Visit: Payer: BLUE CROSS/BLUE SHIELD | Admitting: Cardiovascular Disease

## 2016-12-31 ENCOUNTER — Other Ambulatory Visit: Payer: Self-pay | Admitting: Nurse Practitioner

## 2016-12-31 DIAGNOSIS — E7800 Pure hypercholesterolemia, unspecified: Secondary | ICD-10-CM

## 2017-01-13 ENCOUNTER — Other Ambulatory Visit: Payer: Self-pay | Admitting: Nurse Practitioner

## 2017-01-13 DIAGNOSIS — I10 Essential (primary) hypertension: Secondary | ICD-10-CM

## 2017-01-13 NOTE — Telephone Encounter (Signed)
Last seen for follow up and labs 04/2016

## 2017-01-14 NOTE — Telephone Encounter (Signed)
Last refill without being seen 

## 2017-01-20 ENCOUNTER — Ambulatory Visit: Payer: BLUE CROSS/BLUE SHIELD | Admitting: Nurse Practitioner

## 2017-01-20 ENCOUNTER — Encounter: Payer: Self-pay | Admitting: Nurse Practitioner

## 2017-01-20 VITALS — BP 147/83 | HR 56 | Temp 97.0°F | Ht 64.0 in | Wt 255.0 lb

## 2017-01-20 DIAGNOSIS — E785 Hyperlipidemia, unspecified: Secondary | ICD-10-CM

## 2017-01-20 DIAGNOSIS — R609 Edema, unspecified: Secondary | ICD-10-CM | POA: Diagnosis not present

## 2017-01-20 DIAGNOSIS — E119 Type 2 diabetes mellitus without complications: Secondary | ICD-10-CM | POA: Diagnosis not present

## 2017-01-20 DIAGNOSIS — F411 Generalized anxiety disorder: Secondary | ICD-10-CM | POA: Diagnosis not present

## 2017-01-20 DIAGNOSIS — I10 Essential (primary) hypertension: Secondary | ICD-10-CM | POA: Diagnosis not present

## 2017-01-20 DIAGNOSIS — M7989 Other specified soft tissue disorders: Secondary | ICD-10-CM

## 2017-01-20 DIAGNOSIS — F5101 Primary insomnia: Secondary | ICD-10-CM | POA: Diagnosis not present

## 2017-01-20 DIAGNOSIS — E7800 Pure hypercholesterolemia, unspecified: Secondary | ICD-10-CM | POA: Diagnosis not present

## 2017-01-20 LAB — BAYER DCA HB A1C WAIVED: HB A1C: 6.9 % (ref <7.0)

## 2017-01-20 MED ORDER — QUINAPRIL HCL 40 MG PO TABS: 40.0000 mg | ORAL_TABLET | Freq: Every day | ORAL | 1 refills | Status: DC

## 2017-01-20 MED ORDER — FUROSEMIDE 40 MG PO TABS: ORAL_TABLET | ORAL | 1 refills | Status: DC

## 2017-01-20 MED ORDER — SIMVASTATIN 40 MG PO TABS: ORAL_TABLET | ORAL | 1 refills | Status: DC

## 2017-01-20 MED ORDER — DILTIAZEM HCL ER COATED BEADS 120 MG PO CP24: ORAL_CAPSULE | ORAL | 1 refills | Status: DC

## 2017-01-20 MED ORDER — ALPRAZOLAM 0.25 MG PO TABS: 0.2500 mg | ORAL_TABLET | Freq: Every day | ORAL | 2 refills | Status: DC | PRN

## 2017-01-20 MED ORDER — METOPROLOL TARTRATE 100 MG PO TABS: ORAL_TABLET | ORAL | 1 refills | Status: DC

## 2017-01-20 MED ORDER — ZOLPIDEM TARTRATE 5 MG PO TABS: 5.0000 mg | ORAL_TABLET | Freq: Every evening | ORAL | 1 refills | Status: DC | PRN

## 2017-01-20 MED ORDER — CLONIDINE HCL 0.2 MG PO TABS: 0.2000 mg | ORAL_TABLET | Freq: Two times a day (BID) | ORAL | 1 refills | Status: DC

## 2017-01-20 MED ORDER — METFORMIN HCL 1000 MG PO TABS: 1000.0000 mg | ORAL_TABLET | Freq: Two times a day (BID) | ORAL | 1 refills | Status: DC

## 2017-01-20 NOTE — Progress Notes (Signed)
Subjective:    Patient ID: Ashley [name], female    DOB: November 14, 1950, 66 y.o.   MRN: [id number]  HPI  Ashley [name] is here today for follow up of chronic medical problem.  Outpatient Encounter Medications as of 01/20/2017  Medication Sig  . ALPRAZolam (XANAX) 0.25 MG tablet Take 1 tablet (0.25 mg total) by mouth daily as needed for anxiety.  Marland Kitchen amLODipine (NORVASC) 5 MG tablet TAKE 1 TABLET (5 MG TOTAL) BY MOUTH DAILY.  Marland Kitchen aspirin 325 MG tablet Take 325 mg by mouth daily.  Marland Kitchen CINNAMON PO Take by mouth.  . cloNIDine (CATAPRES) 0.2 MG tablet TAKE 1 TABLET (0.2 MG TOTAL) BY MOUTH 2 (TWO) TIMES DAILY.  Marland Kitchen diltiazem (CARDIZEM CD) 120 MG 24 hr capsule TAKE 1 CAPSULE BY MOUTH EVERY DAY  . furosemide (LASIX) 40 MG tablet 1 po qam and 1/2 po in afternoon  . Ginger, Zingiber officinalis, (GINGER ROOT PO) Take by mouth.  Marland Kitchen glucose blood (BAYER CONTOUR NEXT TEST) test strip Checks blood sugars 1x per day and prn.  E11.9  . metFORMIN (GLUCOPHAGE) 1000 MG tablet TAKE 1 TABLET (1,000 MG TOTAL) BY MOUTH 2 (TWO) TIMES DAILY WITH A MEAL.  . metoprolol (LOPRESSOR) 100 MG tablet TAKE TWO TABLETS BY MOUTH IN THE MORNING AND ONE AT BEDTIME  . quinapril (ACCUPRIL) 40 MG tablet Take 1 tablet (40 mg total) by mouth daily.  . simvastatin (ZOCOR) 40 MG tablet TAKE 1 TABLET BY MOUTH EVERY DAY IN THE EVENING  . Turmeric Curcumin 500 MG CAPS Take by mouth.  . zolpidem (AMBIEN) 5 MG tablet Take 1 tablet (5 mg total) by mouth at bedtime as needed for sleep.     1. Type 2 diabetes mellitus without complication, without long-term current use of insulin ([place]) last hgba1c was 7.3%. Blood sugars at home have been under 140. No hypoglycemia.  2. Hyperlipidemia, unspecified hyperlipidemia type  Does not watch diet  3. Essential hypertension  No c/o chest pain , sob or headache. Does not check blood pressures at home. BP Readings from Last 3 Encounters:  11/03/16 (!) 146/72  10/07/16 (!) 118/57  08/30/16 (!) 102/59     4.  Peripheral edema  Mild lower ext swelling. Usually resolves by morning  5. Severe obesity (BMI >= 40) (HCC)  No recent weight changes    New complaints: Has place on back of thigh-noticed it about 1 month ago. No change in size.  Social history: Husband passed away this year. Does not get to see her granddaughter very much that she has practically raised since birth.   Review of Systems  Constitutional: Negative for activity change and appetite change.  HENT: Negative.   Eyes: Negative for pain.  Respiratory: Negative for shortness of breath.   Cardiovascular: Negative for chest pain, palpitations and leg swelling.  Gastrointestinal: Negative for abdominal pain.  Endocrine: Negative for polydipsia.  Genitourinary: Negative.   Skin: Negative for rash.  Neurological: Negative for dizziness, weakness and headaches.  Hematological: Does not bruise/bleed easily.  Psychiatric/Behavioral: Negative.   All other systems reviewed and are negative.      Objective:   Physical Exam  Constitutional: She is oriented to person, place, and time. She appears well-developed and well-nourished.  HENT:  Nose: Nose normal.  Mouth/Throat: Oropharynx is clear and moist.  Eyes: EOM are normal.  Neck: Trachea normal, normal range of motion and full passive range of motion without pain. Neck supple. No JVD present. Carotid bruit is not present. No  thyromegaly present.  Cardiovascular: Normal rate, regular rhythm, normal heart sounds and intact distal pulses. Exam reveals no gallop and no friction rub.  No murmur heard. Pulmonary/Chest: Effort normal and breath sounds normal.  Abdominal: Soft. Bowel sounds are normal. She exhibits no distension and no mass. There is no tenderness.  pendulous abdomen  Musculoskeletal: Normal range of motion.  Lymphadenopathy:    She has no cervical adenopathy.  Neurological: She is alert and oriented to person, place, and time. She has normal reflexes.  Skin: Skin  is warm and dry.  6cm indurated moveable nodule left butt cheek.  Psychiatric: She has a normal mood and affect. Her behavior is normal. Judgment and thought content normal.   BP (!) 147/83   Pulse (!) 56   Temp (!) 97 F (36.1 C) (Oral)   Ht '5\' 4"'  (1.626 m)   Wt 255 lb (115.7 kg)   BMI 43.77 kg/m   hgba1c 6.9%    Assessment & Plan:  1. Type 2 diabetes mellitus without complication, without long-term current use of insulin (HCC) continue to watch carbs in diet - Bayer DCA Hb A1c Waived - Microalbumin / creatinine urine ratio - metFORMIN (GLUCOPHAGE) 1000 MG tablet; Take 1 tablet (1,000 mg total) by mouth 2 (two) times daily with a meal.  Dispense: 180 tablet; Refill: 1  2. Hyperlipidemia, unspecified hyperlipidemia type Low fat diet - Lipid panel  3. Essential hypertension Low sodium diet - CMP14+EGFR - quinapril (ACCUPRIL) 40 MG tablet; Take 1 tablet (40 mg total) by mouth daily.  Dispense: 90 tablet; Refill: 1 - metoprolol tartrate (LOPRESSOR) 100 MG tablet; TAKE TWO TABLETS BY MOUTH IN THE MORNING AND ONE AT BEDTIME  Dispense: 270 tablet; Refill: 1 - cloNIDine (CATAPRES) 0.2 MG tablet; Take 1 tablet (0.2 mg total) by mouth 2 (two) times daily.  Dispense: 180 tablet; Refill: 1 - diltiazem (CARDIZEM CD) 120 MG 24 hr capsule; 90TAKE 1 CAPSULE BY MOUTH EVERY DAY  Dispense: 90 capsule; Refill: 1  4. Peripheral edema Elevate legs when sitting - furosemide (LASIX) 40 MG tablet; 1 po qam and 1/2 po in afternoon  Dispense: 135 tablet; Refill: 1  5. Severe obesity (BMI >= 40) (HCC) Discussed diet and exercise for person with BMI >25 Will recheck weight in 3-6 months  6. Nodule of soft tissue - Ambulatory referral to General Surgery  7. GAD (generalized anxiety disorder) Stress management - ALPRAZolam (XANAX) 0.25 MG tablet; Take 1 tablet (0.25 mg total) by mouth daily as needed for anxiety.  Dispense: 30 tablet; Refill: 2  8. Primary insomnia Bedtime routine - zolpidem  (AMBIEN) 5 MG tablet; Take 1 tablet (5 mg total) by mouth at bedtime as needed for sleep.  Dispense: 15 tablet; Refill: 1  9. Pure hypercholesterolemia Low fat diet - simvastatin (ZOCOR) 40 MG tablet; TAKE 1 TABLET BY MOUTH EVERY DAY IN THE EVENING  Dispense: 90 tablet; Refill: 1    Labs pending Health maintenance reviewed Diet and exercise encouraged Continue all meds Follow up  In 3 months   [place], FNP

## 2017-01-21 LAB — LIPID PANEL
CHOLESTEROL TOTAL: 188 mg/dL (ref 100–199)
Chol/HDL Ratio: 4.5 ratio — ABNORMAL HIGH (ref 0.0–4.4)
HDL: 42 mg/dL (ref >39)
LDL Calculated: 113 mg/dL — ABNORMAL HIGH (ref 0–99)
Triglycerides: 167 mg/dL — ABNORMAL HIGH (ref 0–149)
VLDL CHOLESTEROL CAL: 33 mg/dL (ref 5–40)

## 2017-01-21 LAB — CMP14+EGFR
ALK PHOS: 57 IU/L (ref 39–117)
ALT: 23 IU/L (ref 0–32)
AST: 28 IU/L (ref 0–40)
Albumin/Globulin Ratio: 1.3 (ref 1.2–2.2)
Albumin: 4 g/dL (ref 3.6–4.8)
BILIRUBIN TOTAL: 0.5 mg/dL (ref 0.0–1.2)
BUN / CREAT RATIO: 25 (ref 12–28)
BUN: 13 mg/dL (ref 8–27)
CHLORIDE: 101 mmol/L (ref 96–106)
CO2: 27 mmol/L (ref 20–29)
Calcium: 9.3 mg/dL (ref 8.7–10.3)
Creatinine, Ser: 0.53 mg/dL — ABNORMAL LOW (ref 0.57–1.00)
GFR calc Af Amer: 116 mL/min/{1.73_m2} (ref >59)
GFR calc non Af Amer: 101 mL/min/{1.73_m2} (ref >59)
GLUCOSE: 170 mg/dL — AB (ref 65–99)
Globulin, Total: 3.1 g/dL (ref 1.5–4.5)
Potassium: 4.2 mmol/L (ref 3.5–5.2)
Sodium: 142 mmol/L (ref 134–144)
Total Protein: 7.1 g/dL (ref 6.0–8.5)

## 2017-02-02 ENCOUNTER — Encounter: Payer: Self-pay | Admitting: *Deleted

## 2017-02-24 ENCOUNTER — Encounter: Payer: Self-pay | Admitting: Nurse Practitioner

## 2017-02-24 ENCOUNTER — Ambulatory Visit: Payer: BLUE CROSS/BLUE SHIELD | Admitting: Nurse Practitioner

## 2017-02-24 VITALS — BP 152/88 | HR 71 | Temp 97.0°F | Ht 64.0 in | Wt 257.0 lb

## 2017-02-24 DIAGNOSIS — J069 Acute upper respiratory infection, unspecified: Secondary | ICD-10-CM

## 2017-02-24 MED ORDER — PREDNISONE 20 MG PO TABS: ORAL_TABLET | ORAL | 0 refills | Status: DC

## 2017-02-24 MED ORDER — HYDROCODONE-HOMATROPINE 5-1.5 MG/5ML PO SYRP: 5.0000 mL | ORAL_SOLUTION | Freq: Four times a day (QID) | ORAL | 0 refills | Status: DC | PRN

## 2017-02-24 MED ORDER — AMOXICILLIN-POT CLAVULANATE 875-125 MG PO TABS
1.0000 | ORAL_TABLET | Freq: Two times a day (BID) | ORAL | 0 refills | Status: DC
Start: 2017-02-24 — End: 2017-05-19

## 2017-02-24 NOTE — Progress Notes (Signed)
Subjective:    Patient ID: Ashley [name], female    DOB: October 21, 1950, 67 y.o.   MRN: [id number]  HPI Patient comes in today c/o cough and congestion that started over 2 weeks ago. Has tried several OTC meds and has had no relief. The last several days she has worsened and feels like she had a sever last night.    Review of Systems  Constitutional: Positive for fever (?). Negative for chills.  HENT: Positive for congestion, postnasal drip and rhinorrhea. Negative for ear pain, sinus pressure, sore throat and trouble swallowing.   Respiratory: Positive for cough (productive). Negative for shortness of breath.   Cardiovascular: Negative.   Gastrointestinal: Negative.   Genitourinary: Negative.   Neurological: Positive for headaches (slight).  Psychiatric/Behavioral: Negative.   All other systems reviewed and are negative.      Objective:   Physical Exam  Constitutional: She is oriented to person, place, and time. She appears well-developed and well-nourished. No distress.  HENT:  Right Ear: Hearing, tympanic membrane, external ear and ear canal normal.  Left Ear: Hearing, tympanic membrane, external ear and ear canal normal.  Nose: Mucosal edema and rhinorrhea present. Right sinus exhibits no maxillary sinus tenderness and no frontal sinus tenderness. Left sinus exhibits no maxillary sinus tenderness and no frontal sinus tenderness.  Neck: Normal range of motion. Neck supple.  Cardiovascular: Normal rate and regular rhythm.  Pulmonary/Chest: Effort normal. No respiratory distress.  Deep wet cough  Abdominal: Soft. Bowel sounds are normal.  Lymphadenopathy:    She has no cervical adenopathy.  Neurological: She is alert and oriented to person, place, and time.  Skin: Skin is warm.  Psychiatric: She has a normal mood and affect. Her behavior is normal. Judgment and thought content normal.   BP (!) 152/88   Pulse 71   Temp (!) 97 F (36.1 C) (Oral)   Ht 5\' 4"  (1.626 m)   Wt 257 lb  (116.6 kg)   BMI 44.11 kg/m         Assessment & Plan:   1. Upper respiratory infection with cough and congestion    Meds ordered this encounter  Medications  . amoxicillin-clavulanate (AUGMENTIN) 875-125 MG tablet    Sig: Take 1 tablet by mouth 2 (two) times daily.    Dispense:  20 tablet    Refill:  0    Order Specific Question:   Supervising Provider    Answer:   VINCENT, CAROL L [[id number]]  . HYDROcodone-homatropine (HYCODAN) 5-1.5 MG/5ML syrup    Sig: Take 5 mLs by mouth every 6 (six) hours as needed for cough.    Dispense:  120 mL    Refill:  0    Order Specific Question:   Supervising Provider    Answer:   VINCENT, CAROL L [[id number]]  . predniSONE (DELTASONE) 20 MG tablet    Sig: 2 po at sametime daily for 5 days    Dispense:  10 tablet    Refill:  0    Order Specific Question:   Supervising Provider    Answer:   VINCENT, CAROL L [[id number]]   1. Take meds as prescribed 2. Use a cool mist humidifier especially during the winter months and when heat has been humid. 3. Use saline nose sprays frequently 4. Saline irrigations of the nose can be very helpful if done frequently.  * 4X daily for 1 week*  * Use of a nettie pot can be helpful with this. Follow directions with this* 5.  Drink plenty of fluids 6. Keep thermostat turn down low 7.For any cough or congestion  Use plain Mucinex- regular strength or max strength is fine   * Children- consult with Pharmacist for dosing 8. For fever or aces or pains- take tylenol or ibuprofen appropriate for age and weight.  * for fevers greater than 101 orally you may alternate ibuprofen and tylenol every  3 hours.   Mary-Margaret Hassell Done, FNP

## 2017-02-24 NOTE — Patient Instructions (Signed)

## 2017-03-15 DIAGNOSIS — L942 Calcinosis cutis: Secondary | ICD-10-CM | POA: Diagnosis not present

## 2017-03-15 HISTORY — PX: CYST EXCISION: SHX5701

## 2017-04-05 DIAGNOSIS — S82402A Unspecified fracture of shaft of left fibula, initial encounter for closed fracture: Secondary | ICD-10-CM | POA: Diagnosis not present

## 2017-04-05 DIAGNOSIS — S8265XA Nondisplaced fracture of lateral malleolus of left fibula, initial encounter for closed fracture: Secondary | ICD-10-CM | POA: Diagnosis not present

## 2017-04-05 DIAGNOSIS — S82202A Unspecified fracture of shaft of left tibia, initial encounter for closed fracture: Secondary | ICD-10-CM | POA: Diagnosis not present

## 2017-04-05 DIAGNOSIS — M25562 Pain in left knee: Secondary | ICD-10-CM | POA: Diagnosis not present

## 2017-04-05 DIAGNOSIS — M25472 Effusion, left ankle: Secondary | ICD-10-CM | POA: Diagnosis not present

## 2017-04-05 DIAGNOSIS — M25572 Pain in left ankle and joints of left foot: Secondary | ICD-10-CM | POA: Insufficient documentation

## 2017-04-19 ENCOUNTER — Encounter (HOSPITAL_COMMUNITY): Payer: Self-pay | Admitting: *Deleted

## 2017-04-19 ENCOUNTER — Other Ambulatory Visit: Payer: Self-pay

## 2017-04-19 DIAGNOSIS — M25572 Pain in left ankle and joints of left foot: Secondary | ICD-10-CM | POA: Diagnosis not present

## 2017-04-19 NOTE — Progress Notes (Signed)
Spoke with pt for pre-op call. Pt denies cardiac history. When asked if she has A-fib she stated no. I asked her if her PCP, Ronnald Collum, FNP requested that pt see a cardiologist and if she has been to one. She states no, she hasn't followed up on that. Pt denies any chest pain or sob. Pt is a type 2 diabetic. Last A1C was 6.9 on 01/20/17. She states she does not check her blood sugar at home.

## 2017-04-19 NOTE — Progress Notes (Signed)
EKG reviewed by Durel Salts, FNP. She spoke with Dr. Annye Asa, Anesthesiologist. Dr. Glennon Mac requested that we call Dr. Veverly Fells and tell him that pt needs to be brought in early tomorrow and have a cardiology consult and possible Echo. I called Dr. Veverly Fells and gave him this information, he states he will call pt and have her come in around 7 AM. He said he would get a consult, but he wasn't sure if they could do an Echo.

## 2017-04-20 ENCOUNTER — Encounter (HOSPITAL_COMMUNITY)
Admission: AD | Disposition: A | Discharge status: Skilled Nursing Facility | Payer: Self-pay | Source: Ambulatory Visit | Attending: Orthopedic Surgery

## 2017-04-20 ENCOUNTER — Ambulatory Visit (HOSPITAL_COMMUNITY): Payer: No Typology Code available for payment source | Admitting: Emergency Medicine

## 2017-04-20 ENCOUNTER — Encounter (HOSPITAL_COMMUNITY): Payer: Self-pay | Admitting: *Deleted

## 2017-04-20 ENCOUNTER — Ambulatory Visit (HOSPITAL_BASED_OUTPATIENT_CLINIC_OR_DEPARTMENT_OTHER): Payer: No Typology Code available for payment source

## 2017-04-20 ENCOUNTER — Inpatient Hospital Stay (HOSPITAL_COMMUNITY)
Admission: AD | Admit: 2017-04-20 | Discharge: 2017-04-25 | DRG: 493 | Disposition: A | Discharge status: Skilled Nursing Facility | Payer: No Typology Code available for payment source | Source: Ambulatory Visit | Attending: Orthopedic Surgery | Admitting: Orthopedic Surgery

## 2017-04-20 DIAGNOSIS — I4891 Unspecified atrial fibrillation: Secondary | ICD-10-CM | POA: Diagnosis not present

## 2017-04-20 DIAGNOSIS — Z7984 Long term (current) use of oral hypoglycemic drugs: Secondary | ICD-10-CM

## 2017-04-20 DIAGNOSIS — W19XXXA Unspecified fall, initial encounter: Secondary | ICD-10-CM | POA: Diagnosis present

## 2017-04-20 DIAGNOSIS — S8262XA Displaced fracture of lateral malleolus of left fibula, initial encounter for closed fracture: Secondary | ICD-10-CM | POA: Diagnosis not present

## 2017-04-20 DIAGNOSIS — Z6841 Body Mass Index (BMI) 40.0 and over, adult: Secondary | ICD-10-CM | POA: Diagnosis not present

## 2017-04-20 DIAGNOSIS — I10 Essential (primary) hypertension: Secondary | ICD-10-CM

## 2017-04-20 DIAGNOSIS — Z01810 Encounter for preprocedural cardiovascular examination: Secondary | ICD-10-CM

## 2017-04-20 DIAGNOSIS — Z8781 Personal history of (healed) traumatic fracture: Secondary | ICD-10-CM

## 2017-04-20 DIAGNOSIS — Z79899 Other long term (current) drug therapy: Secondary | ICD-10-CM | POA: Diagnosis not present

## 2017-04-20 DIAGNOSIS — E785 Hyperlipidemia, unspecified: Secondary | ICD-10-CM | POA: Diagnosis not present

## 2017-04-20 DIAGNOSIS — G8911 Acute pain due to trauma: Secondary | ICD-10-CM | POA: Diagnosis not present

## 2017-04-20 DIAGNOSIS — I481 Persistent atrial fibrillation: Secondary | ICD-10-CM | POA: Diagnosis not present

## 2017-04-20 DIAGNOSIS — Z7982 Long term (current) use of aspirin: Secondary | ICD-10-CM | POA: Diagnosis not present

## 2017-04-20 DIAGNOSIS — I4819 Other persistent atrial fibrillation: Secondary | ICD-10-CM

## 2017-04-20 DIAGNOSIS — F329 Major depressive disorder, single episode, unspecified: Secondary | ICD-10-CM | POA: Diagnosis present

## 2017-04-20 DIAGNOSIS — S8990XA Unspecified injury of unspecified lower leg, initial encounter: Secondary | ICD-10-CM | POA: Diagnosis not present

## 2017-04-20 DIAGNOSIS — Z881 Allergy status to other antibiotic agents status: Secondary | ICD-10-CM | POA: Diagnosis not present

## 2017-04-20 DIAGNOSIS — E119 Type 2 diabetes mellitus without complications: Secondary | ICD-10-CM | POA: Diagnosis not present

## 2017-04-20 DIAGNOSIS — Z9889 Other specified postprocedural states: Secondary | ICD-10-CM

## 2017-04-20 HISTORY — DX: Type 2 diabetes mellitus without complications: E11.9

## 2017-04-20 HISTORY — PX: ORIF ANKLE FRACTURE: SHX5408

## 2017-04-20 LAB — CBC
HCT: 41.6 % (ref 36.0–46.0)
HEMOGLOBIN: 13.5 g/dL (ref 12.0–15.0)
MCH: 29.5 pg (ref 26.0–34.0)
MCHC: 32.5 g/dL (ref 30.0–36.0)
MCV: 90.8 fL (ref 78.0–100.0)
PLATELETS: 311 10*3/uL (ref 150–400)
RBC: 4.58 MIL/uL (ref 3.87–5.11)
RDW: 14.6 % (ref 11.5–15.5)
WBC: 8.5 10*3/uL (ref 4.0–10.5)

## 2017-04-20 LAB — GLUCOSE, CAPILLARY
GLUCOSE-CAPILLARY: 161 mg/dL — AB (ref 65–99)
GLUCOSE-CAPILLARY: 134 mg/dL — AB (ref 65–99)
Glucose-Capillary: 126 mg/dL — ABNORMAL HIGH (ref 65–99)
Glucose-Capillary: 110 mg/dL — ABNORMAL HIGH (ref 65–99)

## 2017-04-20 LAB — BASIC METABOLIC PANEL
ANION GAP: 15 (ref 5–15)
BUN: 16 mg/dL (ref 6–20)
CO2: 19 mmol/L — ABNORMAL LOW (ref 22–32)
Calcium: 9.2 mg/dL (ref 8.9–10.3)
Chloride: 103 mmol/L (ref 101–111)
Creatinine, Ser: 0.56 mg/dL (ref 0.44–1.00)
GFR calc Af Amer: >60 mL/min (ref >60)
Glucose, Bld: 164 mg/dL — ABNORMAL HIGH (ref 65–99)
POTASSIUM: 4.2 mmol/L (ref 3.5–5.1)
SODIUM: 137 mmol/L (ref 135–145)

## 2017-04-20 LAB — ECHOCARDIOGRAM COMPLETE
HEIGHTINCHES: 62.5 in
WEIGHTICAEL: 4176 [oz_av]

## 2017-04-20 LAB — HEMOGLOBIN A1C
Hgb A1c MFr Bld: 6.8 % — ABNORMAL HIGH (ref 4.8–5.6)
Mean Plasma Glucose: 148.46 mg/dL

## 2017-04-20 SURGERY — OPEN REDUCTION INTERNAL FIXATION (ORIF) ANKLE FRACTURE
Anesthesia: General | Site: Ankle | Laterality: Left

## 2017-04-20 MED ORDER — MIDAZOLAM HCL 2 MG/2ML IJ SOLN
INTRAMUSCULAR | Status: AC
  Filled 2017-04-20: qty 2

## 2017-04-20 MED ORDER — LIDOCAINE HCL (CARDIAC) 20 MG/ML IV SOLN
INTRAVENOUS | Status: AC
  Filled 2017-04-20: qty 5

## 2017-04-20 MED ORDER — CLONIDINE HCL 0.2 MG PO TABS
0.2000 mg | ORAL_TABLET | Freq: Two times a day (BID) | ORAL | Status: DC
  Administered 2017-04-20 – 2017-04-25 (×7): 0.2 mg via ORAL
  Filled 2017-04-20 (×8): qty 1

## 2017-04-20 MED ORDER — ONDANSETRON HCL 4 MG/2ML IJ SOLN
INTRAMUSCULAR | Status: AC
  Filled 2017-04-20: qty 2

## 2017-04-20 MED ORDER — FUROSEMIDE 20 MG PO TABS: 20.0000 mg | ORAL_TABLET | Freq: Every day | ORAL | Status: DC | PRN

## 2017-04-20 MED ORDER — DOCUSATE SODIUM 100 MG PO CAPS
100.0000 mg | ORAL_CAPSULE | Freq: Two times a day (BID) | ORAL | Status: DC
  Administered 2017-04-20 – 2017-04-25 (×10): 100 mg via ORAL
  Filled 2017-04-20 (×10): qty 1

## 2017-04-20 MED ORDER — AMLODIPINE BESYLATE 5 MG PO TABS
5.0000 mg | ORAL_TABLET | Freq: Every day | ORAL | Status: DC
  Administered 2017-04-21 – 2017-04-22 (×2): 5 mg via ORAL
  Filled 2017-04-20 (×2): qty 1

## 2017-04-20 MED ORDER — PHENYLEPHRINE 40 MCG/ML (10ML) SYRINGE FOR IV PUSH (FOR BLOOD PRESSURE SUPPORT)
PREFILLED_SYRINGE | INTRAVENOUS | Status: DC | PRN
  Administered 2017-04-20 (×2): 80 ug via INTRAVENOUS

## 2017-04-20 MED ORDER — SUGAMMADEX SODIUM 200 MG/2ML IV SOLN
INTRAVENOUS | Status: DC | PRN
  Administered 2017-04-20: 200 mg via INTRAVENOUS

## 2017-04-20 MED ORDER — SUCCINYLCHOLINE CHLORIDE 20 MG/ML IJ SOLN
INTRAMUSCULAR | Status: AC
  Filled 2017-04-20: qty 1

## 2017-04-20 MED ORDER — POLYETHYLENE GLYCOL 3350 17 G PO PACK
17.0000 g | PACK | Freq: Every day | ORAL | Status: DC | PRN
  Administered 2017-04-24 (×2): 17 g via ORAL
  Filled 2017-04-20 (×2): qty 1

## 2017-04-20 MED ORDER — CEFAZOLIN SODIUM-DEXTROSE 2-4 GM/100ML-% IV SOLN
2.0000 g | Freq: Four times a day (QID) | INTRAVENOUS | Status: AC
  Administered 2017-04-20 – 2017-04-21 (×3): 2 g via INTRAVENOUS
  Filled 2017-04-20 (×3): qty 100

## 2017-04-20 MED ORDER — QUINAPRIL HCL 10 MG PO TABS
40.0000 mg | ORAL_TABLET | Freq: Every day | ORAL | Status: DC
  Administered 2017-04-20 – 2017-04-25 (×5): 40 mg via ORAL
  Filled 2017-04-20 (×6): qty 4

## 2017-04-20 MED ORDER — METOPROLOL TARTRATE 100 MG PO TABS
100.0000 mg | ORAL_TABLET | Freq: Every day | ORAL | Status: DC
  Administered 2017-04-20: 100 mg via ORAL
  Filled 2017-04-20: qty 1

## 2017-04-20 MED ORDER — TURMERIC CURCUMIN 500 MG PO CAPS: 500.0000 mg | ORAL_CAPSULE | Freq: Every day | ORAL | Status: DC

## 2017-04-20 MED ORDER — METOCLOPRAMIDE HCL 5 MG/ML IJ SOLN: 5.0000 mg | Freq: Three times a day (TID) | INTRAMUSCULAR | Status: DC | PRN

## 2017-04-20 MED ORDER — MIDAZOLAM HCL 5 MG/5ML IJ SOLN
INTRAMUSCULAR | Status: DC | PRN
  Administered 2017-04-20: 2 mg via INTRAVENOUS

## 2017-04-20 MED ORDER — LIDOCAINE 2% (20 MG/ML) 5 ML SYRINGE
INTRAMUSCULAR | Status: DC | PRN
  Administered 2017-04-20: 80 mg via INTRAVENOUS

## 2017-04-20 MED ORDER — ASPIRIN 325 MG PO TABS
325.0000 mg | ORAL_TABLET | Freq: Every day | ORAL | Status: DC
  Administered 2017-04-21 – 2017-04-23 (×3): 325 mg via ORAL
  Filled 2017-04-20 (×3): qty 1

## 2017-04-20 MED ORDER — ACETAMINOPHEN 500 MG PO TABS
1000.0000 mg | ORAL_TABLET | Freq: Four times a day (QID) | ORAL | Status: DC | PRN
  Administered 2017-04-23: 1000 mg via ORAL
  Filled 2017-04-20: qty 2

## 2017-04-20 MED ORDER — METOPROLOL TARTRATE 100 MG PO TABS
200.0000 mg | ORAL_TABLET | Freq: Every morning | ORAL | Status: DC
  Administered 2017-04-21: 200 mg via ORAL
  Filled 2017-04-20 (×2): qty 2

## 2017-04-20 MED ORDER — HYDROCODONE-ACETAMINOPHEN 7.5-325 MG PO TABS
1.0000 | ORAL_TABLET | ORAL | Status: DC | PRN
  Administered 2017-04-20 – 2017-04-22 (×3): 2 via ORAL
  Administered 2017-04-22 – 2017-04-23 (×2): 1 via ORAL
  Administered 2017-04-23: 2 via ORAL
  Administered 2017-04-23 – 2017-04-24 (×4): 1 via ORAL
  Administered 2017-04-25: 2 via ORAL
  Filled 2017-04-20: qty 1
  Filled 2017-04-20 (×2): qty 2
  Filled 2017-04-20 (×2): qty 1
  Filled 2017-04-20: qty 2
  Filled 2017-04-20: qty 1
  Filled 2017-04-20: qty 2
  Filled 2017-04-20 (×2): qty 1
  Filled 2017-04-20: qty 2
  Filled 2017-04-20: qty 1

## 2017-04-20 MED ORDER — LACTATED RINGERS IV SOLN: INTRAVENOUS | Status: DC

## 2017-04-20 MED ORDER — PROMETHAZINE HCL 25 MG/ML IJ SOLN: 6.2500 mg | INTRAMUSCULAR | Status: DC | PRN

## 2017-04-20 MED ORDER — ROCURONIUM BROMIDE 10 MG/ML (PF) SYRINGE
PREFILLED_SYRINGE | INTRAVENOUS | Status: DC | PRN
  Administered 2017-04-20: 50 mg via INTRAVENOUS

## 2017-04-20 MED ORDER — HYDROMORPHONE HCL 1 MG/ML IJ SOLN
INTRAMUSCULAR | Status: AC
  Filled 2017-04-20: qty 1

## 2017-04-20 MED ORDER — HYDROCODONE-ACETAMINOPHEN 5-325 MG PO TABS
1.0000 | ORAL_TABLET | ORAL | Status: DC | PRN
  Administered 2017-04-21: 2 via ORAL
  Administered 2017-04-21: 1 via ORAL
  Administered 2017-04-22 (×2): 2 via ORAL
  Filled 2017-04-20: qty 1
  Filled 2017-04-20 (×4): qty 2

## 2017-04-20 MED ORDER — TRAMADOL HCL 50 MG PO TABS
50.0000 mg | ORAL_TABLET | Freq: Four times a day (QID) | ORAL | Status: DC
  Administered 2017-04-20 – 2017-04-25 (×17): 50 mg via ORAL
  Filled 2017-04-20 (×18): qty 1

## 2017-04-20 MED ORDER — DEXAMETHASONE SODIUM PHOSPHATE 10 MG/ML IJ SOLN
INTRAMUSCULAR | Status: AC
  Filled 2017-04-20: qty 1

## 2017-04-20 MED ORDER — ONDANSETRON HCL 4 MG/2ML IJ SOLN
INTRAMUSCULAR | Status: DC | PRN
  Administered 2017-04-20: 4 mg via INTRAVENOUS

## 2017-04-20 MED ORDER — MORPHINE SULFATE (PF) 2 MG/ML IV SOLN
0.5000 mg | INTRAVENOUS | Status: DC | PRN
  Administered 2017-04-21 – 2017-04-24 (×2): 1 mg via INTRAVENOUS
  Filled 2017-04-20 (×2): qty 1

## 2017-04-20 MED ORDER — SODIUM CHLORIDE 0.9 % IV SOLN
INTRAVENOUS | Status: DC
  Administered 2017-04-20: 18:00:00 via INTRAVENOUS

## 2017-04-20 MED ORDER — ACETAMINOPHEN 325 MG PO TABS
325.0000 mg | ORAL_TABLET | Freq: Four times a day (QID) | ORAL | Status: DC | PRN
  Administered 2017-04-25: 650 mg via ORAL
  Filled 2017-04-20: qty 2

## 2017-04-20 MED ORDER — PROPOFOL 10 MG/ML IV BOLUS
INTRAVENOUS | Status: AC
  Filled 2017-04-20: qty 20

## 2017-04-20 MED ORDER — PHENYLEPHRINE 40 MCG/ML (10ML) SYRINGE FOR IV PUSH (FOR BLOOD PRESSURE SUPPORT)
PREFILLED_SYRINGE | INTRAVENOUS | Status: AC
  Filled 2017-04-20: qty 10

## 2017-04-20 MED ORDER — PROMETHAZINE HCL 25 MG/ML IJ SOLN: 6.2500 mg | INTRAMUSCULAR | 0 refills | Status: DC | PRN

## 2017-04-20 MED ORDER — CHLORHEXIDINE GLUCONATE 4 % EX LIQD: 60.0000 mL | Freq: Once | CUTANEOUS | Status: DC

## 2017-04-20 MED ORDER — ALPRAZOLAM 0.25 MG PO TABS
0.2500 mg | ORAL_TABLET | Freq: Every day | ORAL | Status: DC | PRN
  Administered 2017-04-21 – 2017-04-24 (×2): 0.25 mg via ORAL
  Filled 2017-04-20 (×2): qty 1

## 2017-04-20 MED ORDER — FENTANYL CITRATE (PF) 250 MCG/5ML IJ SOLN
INTRAMUSCULAR | Status: DC | PRN
  Administered 2017-04-20: 50 ug via INTRAVENOUS
  Administered 2017-04-20: 25 ug via INTRAVENOUS
  Administered 2017-04-20 (×2): 50 ug via INTRAVENOUS
  Administered 2017-04-20: 75 ug via INTRAVENOUS

## 2017-04-20 MED ORDER — METHOCARBAMOL 500 MG PO TABS
500.0000 mg | ORAL_TABLET | Freq: Four times a day (QID) | ORAL | Status: DC | PRN
  Administered 2017-04-20 – 2017-04-25 (×11): 500 mg via ORAL
  Filled 2017-04-20 (×11): qty 1

## 2017-04-20 MED ORDER — BISACODYL 10 MG RE SUPP: 10.0000 mg | Freq: Every day | RECTAL | Status: DC | PRN

## 2017-04-20 MED ORDER — FENTANYL CITRATE (PF) 250 MCG/5ML IJ SOLN
INTRAMUSCULAR | Status: AC
  Filled 2017-04-20: qty 5

## 2017-04-20 MED ORDER — KETOROLAC TROMETHAMINE 30 MG/ML IJ SOLN
INTRAMUSCULAR | Status: AC
  Filled 2017-04-20: qty 1

## 2017-04-20 MED ORDER — LACTATED RINGERS IV SOLN
INTRAVENOUS | Status: DC | PRN
  Administered 2017-04-20 (×2): via INTRAVENOUS

## 2017-04-20 MED ORDER — METHOCARBAMOL 1000 MG/10ML IJ SOLN
500.0000 mg | Freq: Four times a day (QID) | INTRAVENOUS | Status: DC | PRN
  Filled 2017-04-20: qty 5

## 2017-04-20 MED ORDER — ONDANSETRON HCL 4 MG PO TABS
4.0000 mg | ORAL_TABLET | Freq: Four times a day (QID) | ORAL | Status: DC | PRN
  Filled 2017-04-20: qty 1

## 2017-04-20 MED ORDER — SIMVASTATIN 40 MG PO TABS
40.0000 mg | ORAL_TABLET | Freq: Every evening | ORAL | Status: DC
  Administered 2017-04-20: 40 mg via ORAL
  Filled 2017-04-20: qty 1

## 2017-04-20 MED ORDER — SUGAMMADEX SODIUM 200 MG/2ML IV SOLN
INTRAVENOUS | Status: AC
  Filled 2017-04-20: qty 2

## 2017-04-20 MED ORDER — ONDANSETRON HCL 4 MG/2ML IJ SOLN: 4.0000 mg | Freq: Four times a day (QID) | INTRAMUSCULAR | Status: DC | PRN

## 2017-04-20 MED ORDER — PROPOFOL 10 MG/ML IV BOLUS
INTRAVENOUS | Status: DC | PRN
  Administered 2017-04-20: 160 mg via INTRAVENOUS

## 2017-04-20 MED ORDER — METOCLOPRAMIDE HCL 5 MG PO TABS: 5.0000 mg | ORAL_TABLET | Freq: Three times a day (TID) | ORAL | Status: DC | PRN

## 2017-04-20 MED ORDER — HYDROCODONE-HOMATROPINE 5-1.5 MG/5ML PO SYRP: 5.0000 mL | ORAL_SOLUTION | Freq: Four times a day (QID) | ORAL | Status: DC | PRN

## 2017-04-20 MED ORDER — HYDROCODONE-ACETAMINOPHEN 5-325 MG PO TABS: 1.0000 | ORAL_TABLET | Freq: Four times a day (QID) | ORAL | 0 refills | Status: DC | PRN

## 2017-04-20 MED ORDER — KETOROLAC TROMETHAMINE 30 MG/ML IJ SOLN
INTRAMUSCULAR | Status: DC | PRN
  Administered 2017-04-20: 30 mg via INTRAVENOUS

## 2017-04-20 MED ORDER — CEFAZOLIN SODIUM-DEXTROSE 2-4 GM/100ML-% IV SOLN
2.0000 g | INTRAVENOUS | Status: AC
  Administered 2017-04-20: 2 g via INTRAVENOUS
  Filled 2017-04-20: qty 100

## 2017-04-20 MED ORDER — ATORVASTATIN CALCIUM 20 MG PO TABS
20.0000 mg | ORAL_TABLET | Freq: Every day | ORAL | Status: DC
  Administered 2017-04-21 – 2017-04-24 (×4): 20 mg via ORAL
  Filled 2017-04-20 (×4): qty 1

## 2017-04-20 MED ORDER — HYDROMORPHONE HCL 1 MG/ML IJ SOLN
0.2500 mg | INTRAMUSCULAR | Status: DC | PRN
  Administered 2017-04-20 (×3): 0.5 mg via INTRAVENOUS

## 2017-04-20 MED ORDER — INSULIN ASPART 100 UNIT/ML ~~LOC~~ SOLN
0.0000 [IU] | Freq: Three times a day (TID) | SUBCUTANEOUS | Status: DC
  Administered 2017-04-21: 3 [IU] via SUBCUTANEOUS
  Administered 2017-04-21 (×2): 2 [IU] via SUBCUTANEOUS
  Administered 2017-04-22: 3 [IU] via SUBCUTANEOUS
  Administered 2017-04-22 – 2017-04-25 (×8): 2 [IU] via SUBCUTANEOUS

## 2017-04-20 MED ORDER — 0.9 % SODIUM CHLORIDE (POUR BTL) OPTIME
TOPICAL | Status: DC | PRN
  Administered 2017-04-20: 1000 mL

## 2017-04-20 MED ORDER — INSULIN ASPART 100 UNIT/ML ~~LOC~~ SOLN
4.0000 [IU] | Freq: Three times a day (TID) | SUBCUTANEOUS | Status: DC
  Administered 2017-04-21 – 2017-04-25 (×14): 4 [IU] via SUBCUTANEOUS

## 2017-04-20 MED ORDER — CHLORHEXIDINE GLUCONATE 4 % EX LIQD: 60.0000 mL | Freq: Once | CUTANEOUS | 0 refills | Status: AC

## 2017-04-20 MED ORDER — METFORMIN HCL 500 MG PO TABS
1000.0000 mg | ORAL_TABLET | Freq: Two times a day (BID) | ORAL | Status: DC
  Administered 2017-04-20 – 2017-04-25 (×10): 1000 mg via ORAL
  Filled 2017-04-20 (×10): qty 2

## 2017-04-20 MED ORDER — INSULIN ASPART 100 UNIT/ML ~~LOC~~ SOLN: 0.0000 [IU] | Freq: Every day | SUBCUTANEOUS | Status: DC

## 2017-04-20 MED ORDER — DILTIAZEM HCL ER COATED BEADS 120 MG PO CP24
120.0000 mg | ORAL_CAPSULE | Freq: Every day | ORAL | Status: DC
  Administered 2017-04-21 – 2017-04-24 (×4): 120 mg via ORAL
  Filled 2017-04-20 (×4): qty 1

## 2017-04-20 SURGICAL SUPPLY — 69 items
BANDAGE ACE 4X5 VEL STRL LF (GAUZE/BANDAGES/DRESSINGS) ×3 IMPLANT
BANDAGE ACE 6X5 VEL STRL LF (GAUZE/BANDAGES/DRESSINGS) ×3 IMPLANT
BANDAGE ESMARK 6X9 LF (GAUZE/BANDAGES/DRESSINGS) ×1 IMPLANT
BIT DRILL 2.5X2.75 QC CALB (BIT) ×2 IMPLANT
BIT DRILL 3.5X5.5 QC CALB (BIT) ×2 IMPLANT
BNDG CMPR 9X6 STRL LF SNTH (GAUZE/BANDAGES/DRESSINGS) ×1
BNDG ESMARK 6X9 LF (GAUZE/BANDAGES/DRESSINGS) ×3
BNDG GAUZE ELAST 4 BULKY (GAUZE/BANDAGES/DRESSINGS) ×3 IMPLANT
CUFF TOURNIQUET SINGLE 34IN LL (TOURNIQUET CUFF) ×2 IMPLANT
CUFF TOURNIQUET SINGLE 44IN (TOURNIQUET CUFF) IMPLANT
DECANTER SPIKE VIAL GLASS SM (MISCELLANEOUS) ×1 IMPLANT
DRAPE OEC MINIVIEW 54X84 (DRAPES) ×2 IMPLANT
DRAPE U-SHAPE 47X51 STRL (DRAPES) ×3 IMPLANT
DRSG ADAPTIC 3X8 NADH LF (GAUZE/BANDAGES/DRESSINGS) ×1 IMPLANT
DRSG PAD ABDOMINAL 8X10 ST (GAUZE/BANDAGES/DRESSINGS) ×3 IMPLANT
DURAPREP 26ML APPLICATOR (WOUND CARE) ×3 IMPLANT
ELECT REM PT RETURN 9FT ADLT (ELECTROSURGICAL) ×3
ELECTRODE REM PT RTRN 9FT ADLT (ELECTROSURGICAL) ×1 IMPLANT
FACESHIELD WRAPAROUND (MASK) IMPLANT
FACESHIELD WRAPAROUND OR TEAM (MASK) ×1 IMPLANT
GAUZE SPONGE 4X4 12PLY STRL (GAUZE/BANDAGES/DRESSINGS) ×1 IMPLANT
GAUZE SPONGE 4X4 12PLY STRL LF (GAUZE/BANDAGES/DRESSINGS) ×2 IMPLANT
GAUZE XEROFORM 1X8 LF (GAUZE/BANDAGES/DRESSINGS) ×2 IMPLANT
GLOVE BIOGEL PI ORTHO PRO 7.5 (GLOVE) ×2
GLOVE BIOGEL PI ORTHO PRO SZ8 (GLOVE) ×2
GLOVE ORTHO TXT STRL SZ7.5 (GLOVE) ×3 IMPLANT
GLOVE PI ORTHO PRO STRL 7.5 (GLOVE) ×1 IMPLANT
GLOVE PI ORTHO PRO STRL SZ8 (GLOVE) ×1 IMPLANT
GLOVE SURG ORTHO 8.5 STRL (GLOVE) ×3 IMPLANT
GOWN STRL REUS W/ TWL LRG LVL3 (GOWN DISPOSABLE) ×3 IMPLANT
GOWN STRL REUS W/ TWL XL LVL3 (GOWN DISPOSABLE) ×2 IMPLANT
GOWN STRL REUS W/TWL LRG LVL3 (GOWN DISPOSABLE) ×9
GOWN STRL REUS W/TWL XL LVL3 (GOWN DISPOSABLE) ×9
KIT BASIN OR (CUSTOM PROCEDURE TRAY) ×3 IMPLANT
KIT ROOM TURNOVER OR (KITS) ×3 IMPLANT
MANIFOLD NEPTUNE II (INSTRUMENTS) ×1 IMPLANT
NS IRRIG 1000ML POUR BTL (IV SOLUTION) ×3 IMPLANT
PACK ORTHO EXTREMITY (CUSTOM PROCEDURE TRAY) ×3 IMPLANT
PAD ABD 8X10 STRL (GAUZE/BANDAGES/DRESSINGS) ×2 IMPLANT
PAD ARMBOARD 7.5X6 YLW CONV (MISCELLANEOUS) ×4 IMPLANT
PAD CAST 4YDX4 CTTN HI CHSV (CAST SUPPLIES) ×2 IMPLANT
PADDING CAST COTTON 4X4 STRL (CAST SUPPLIES) ×3
PADDING CAST COTTON 6X4 STRL (CAST SUPPLIES) ×2 IMPLANT
PLATE TUB 100DEG 5 HO (Plate) ×2 IMPLANT
SCREW CORTICAL 3.5MM  16MM (Screw) ×2 IMPLANT
SCREW CORTICAL 3.5MM  20MM (Screw) ×2 IMPLANT
SCREW CORTICAL 3.5MM 16MM (Screw) IMPLANT
SCREW CORTICAL 3.5MM 18MM (Screw) ×2 IMPLANT
SCREW CORTICAL 3.5MM 20MM (Screw) IMPLANT
SCREW CORTICAL 3.5MM 22MM (Screw) ×2 IMPLANT
SPONGE LAP 4X18 X RAY DECT (DISPOSABLE) ×4 IMPLANT
STAPLER VISISTAT 35W (STAPLE) ×3 IMPLANT
SUCTION FRAZIER HANDLE 10FR (MISCELLANEOUS) ×2
SUCTION TUBE FRAZIER 10FR DISP (MISCELLANEOUS) ×1 IMPLANT
SUT ETHILON 3 0 FSL (SUTURE) ×4 IMPLANT
SUT VIC AB 0 CT1 27 (SUTURE) ×3
SUT VIC AB 0 CT1 27XBRD ANBCTR (SUTURE) IMPLANT
SUT VIC AB 2-0 CT1 27 (SUTURE) ×6
SUT VIC AB 2-0 CT1 36 (SUTURE) ×2 IMPLANT
SUT VIC AB 2-0 CT1 TAPERPNT 27 (SUTURE) ×2 IMPLANT
SUT VIC AB 2-0 SH 27 (SUTURE) ×3
SUT VIC AB 2-0 SH 27XBRD (SUTURE) IMPLANT
SUT VIC AB 3-0 CT1 27 (SUTURE) ×3
SUT VIC AB 3-0 CT1 TAPERPNT 27 (SUTURE) ×1 IMPLANT
TOWEL OR 17X24 6PK STRL BLUE (TOWEL DISPOSABLE) ×1 IMPLANT
TOWEL OR 17X26 10 PK STRL BLUE (TOWEL DISPOSABLE) ×3 IMPLANT
TUBE CONNECTING 12'X1/4 (SUCTIONS) ×1
TUBE CONNECTING 12X1/4 (SUCTIONS) ×2 IMPLANT
WATER STERILE IRR 1000ML POUR (IV SOLUTION) ×1 IMPLANT

## 2017-04-20 NOTE — Progress Notes (Signed)
Dr Orene Desanctis in to see pt Asking if cardiology has been consulted. Engineer, maintenance (IT) paged. Dr Orene Desanctis wants cardiology to answer if there is any thing we need to do now for the A fib?  and does she need an Echo prior to surgery?  Reported to Dr Orene Desanctis that heart rate has been in the 50's and did go down to 47.

## 2017-04-20 NOTE — Progress Notes (Signed)
  Echocardiogram 2D Echocardiogram has been performed.  Ashley [name] L Androw 04/20/2017, 12:25 PM

## 2017-04-20 NOTE — Consult Note (Signed)
Cardiology Consultation:   Patient ID: Ashley [name]; [id number]; August 15, 1950   Admit date: 04/20/2017 Date of Consult: 04/20/2017  Primary Care Provider: Chevis Pretty, [place] Primary Cardiologist: No primary care provider on file.    Patient Profile:   Ashley [name] is a 67 y.o. female with a hx of hypertension, diabetes type 2, hyperlipidemia who is being seen today for the evaluation of atrial fibrillation and preoperative evaluation at the request of Dr. Veverly Fells.  History of Present Illness:   Ashley [name] fractured her ankle approximately 2 weeks ago and was brought in today for plann surgical intervention.  She was noted to be in atrial fibrillation and we have been called to evaluate the patient and her fitness for surgery. Ashley [name] was seen by her primary care physician in September 2018 with complaints of chest tightness and was noted to be in atrial fibrillation at that time.  Her rate was controlled.  She was advised to be seen by a cardiologist, however, she was in the midst of changing insurance plans and never did see a cardiologist.  She has not been on any anticoagulation except for a full dose aspirin.  She has no prior history of MI or other cardiac issues.  She did have evaluation for chest pain many years ago which was negative and felt to be related to indigestion.  She denies any previous bleeding issues.  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  She is able to climb 1 flight of stairs, slowly.  She has no palpitations, lightheadedness, syncope/near syncope, orthopnea, or PND.  She has occasional pedal edema after being on her feet all day which resolves with elevation.  Ashley [name] has a family history positive for CHF and hypertension in her father who is deceased.  Her mother is living at the age of 46 and had bypass surgery at the age of 39.  She has a sister who is no longer living who had a history of  diabetes, CAD and hypertension.  She has 1 brother with diabetes, CAD and hypertension.  She has 1 sister that is healthy.  The patient does not smoke or drink alcohol.  Past Medical History:  Diagnosis Date  . Depression   . Diabetes mellitus without complication ([place])   . Hyperlipidemia   . Hypertension     Past Surgical History:  Procedure Laterality Date  . CYST EXCISION Left 03/15/2017   from buttocks     Home Medications:  Prior to Admission medications   Medication Sig Start Date End Date Taking? Authorizing Provider  acetaminophen (TYLENOL) 500 MG tablet Take 1,000 mg by mouth every 6 (six) hours as needed (for pain/headaches.).   Yes [provider]  ALPRAZolam (XANAX) 0.25 MG tablet Take 1 tablet (0.25 mg total) by mouth daily as needed for anxiety. 01/20/17  Yes Martin, Mary-Margaret, FNP  amLODipine (NORVASC) 5 MG tablet TAKE 1 TABLET (5 MG TOTAL) BY MOUTH DAILY. 11/28/16  Yes Martin, Mary-Margaret, FNP  aspirin 325 MG tablet Take 325 mg by mouth daily at 2 PM.    Yes [provider]  CINNAMON PO Take 1 capsule by mouth daily.    Yes [provider]  cloNIDine (CATAPRES) 0.2 MG tablet Take 1 tablet (0.2 mg total) by mouth 2 (two) times daily. 01/20/17  Yes Martin, Mary-Margaret, FNP  diltiazem (CARDIZEM CD) 120 MG 24 hr capsule 90TAKE 1 CAPSULE BY MOUTH EVERY DAY Patient taking differently:  Take 120 mg by mouth daily. 90TAKE 1 CAPSULE BY MOUTH EVERY DAY 01/20/17  Yes Hassell Done, Mary-Margaret, FNP  Ginger, Zingiber officinalis, (GINGER ROOT PO) Take 1 capsule by mouth daily.    Yes [provider]  HYDROcodone-homatropine (HYCODAN) 5-1.5 MG/5ML syrup Take 5 mLs by mouth every 6 (six) hours as needed for cough. 02/24/17  Yes Hassell Done, Mary-Margaret, FNP  metFORMIN (GLUCOPHAGE) 1000 MG tablet Take 1 tablet (1,000 mg total) by mouth 2 (two) times daily with a meal. 01/20/17  Yes Hassell Done, Mary-Margaret, FNP  metoprolol tartrate (LOPRESSOR) 100 MG  tablet TAKE TWO TABLETS BY MOUTH IN THE MORNING AND ONE AT BEDTIME Patient taking differently: Take 100-200 mg by mouth 2 (two) times daily. TAKE TWO TABLETS BY MOUTH IN THE MORNING AND ONE AT BEDTIME 01/20/17  Yes Hassell Done, Mary-Margaret, FNP  quinapril (ACCUPRIL) 40 MG tablet Take 1 tablet (40 mg total) by mouth daily. 01/20/17  Yes Martin, Mary-Margaret, FNP  simvastatin (ZOCOR) 40 MG tablet TAKE 1 TABLET BY MOUTH EVERY DAY IN THE EVENING Patient taking differently: Take 40 mg by mouth every evening.  01/20/17  Yes Martin, Mary-Margaret, FNP  Turmeric Curcumin 500 MG CAPS Take 500 mg by mouth daily.    Yes [provider]  zolpidem (AMBIEN) 5 MG tablet Take 1 tablet (5 mg total) by mouth at bedtime as needed for sleep. 01/20/17  Yes Hassell Done, Mary-Margaret, FNP  amoxicillin-clavulanate (AUGMENTIN) 875-125 MG tablet Take 1 tablet by mouth 2 (two) times daily. Patient not taking: Reported on 04/19/2017 02/24/17   Chevis Pretty, FNP  furosemide (LASIX) 40 MG tablet 1 po qam and 1/2 po in afternoon Patient taking differently: Take 20-40 mg by mouth daily as needed for fluid.  01/20/17   Hassell Done, Mary-Margaret, FNP  glucose blood (BAYER CONTOUR NEXT TEST) test strip Checks blood sugars 1x per day and prn.  E11.9 12/10/15   Hassell Done, Mary-Margaret, FNP  predniSONE (DELTASONE) 20 MG tablet 2 po at sametime daily for 5 days Patient not taking: Reported on 04/19/2017 02/24/17   Chevis Pretty, FNP    Inpatient Medications: Scheduled Meds: . chlorhexidine  60 mL Topical Once   Continuous Infusions: .  ceFAZolin (ANCEF) IV    . lactated ringers     PRN Meds:   Allergies:    Allergies  Allergen Reactions  . Ciprofloxacin Hcl Hives    Social History:   Social History   Socioeconomic History  . Marital status: Married    Spouse name: Not on file  . Number of children: Not on file  . Years of education: Not on file  . Highest education level: Not on file  Social Needs  .  Financial resource strain: Not on file  . Food insecurity - worry: Not on file  . Food insecurity - inability: Not on file  . Transportation needs - medical: Not on file  . Transportation needs - non-medical: Not on file  Occupational History  . Not on file  Tobacco Use  . Smoking status: Never Smoker  . Smokeless tobacco: Never Used  Substance and Sexual Activity  . Alcohol use: No  . Drug use: No  . Sexual activity: Not on file  Other Topics Concern  . Not on file  Social History Narrative  . Not on file    Family History:    Family History  Problem Relation Age of Onset  . Diabetes Mother   . CAD Mother        CABG at age 16  .  Hypertension Mother   . Diabetes Father   . Heart disease Father        CHF  . Diabetes Sister   . CAD Sister   . Hypertension Sister   . Diabetes Brother   . CAD Brother   . Hypertension Brother   . Healthy Sister      ROS:  Please see the history of present illness.   All other ROS reviewed and negative.     Physical Exam/Data:   Vitals:   04/20/17 0935 04/20/17 0944  BP: 136/67   Pulse: (!) 50   Resp: 20   Temp: 97.9 F (36.6 C)   TempSrc: Oral   SpO2: 98%   Weight: 261 lb (118.4 kg) 261 lb (118.4 kg)  Height: 5' 2.5" (1.588 m) 5' 2.5" (1.588 m)   No intake or output data in the 24 hours ending 04/20/17 1102 Filed Weights   04/20/17 0935 04/20/17 0944  Weight: 261 lb (118.4 kg) 261 lb (118.4 kg)   Body mass index is 46.98 kg/m.  General:  Well nourished, well developed, in no acute distress HEENT: normal Lymph: no adenopathy Neck: no JVD Endocrine:  No thryomegaly Vascular: No carotid bruits; FA pulses 2+ bilaterally without bruits  Cardiac:  normal S1, S2; RRR; 2/6 systolic murmur in left chest, axilla Lungs:  clear to auscultation bilaterally, no wheezing, rhonchi or rales  Abd: soft, nontender, no hepatomegaly  Ext: no edema Musculoskeletal: left lower leg in splint Skin: warm and dry  Neuro:  CNs 2-12  intact, no focal abnormalities noted Psych:  Normal affect   EKG:  The EKG was personally reviewed and demonstrates: Atrial fibrillation, rates 50s-60s, septal infarct, age undetermined Telemetry:  Telemetry was personally reviewed and demonstrates: Atrial fibrillation with rates in the 50s-60s.   Relevant CV Studies:  Normal stress test was normal EF in 2003  Laboratory Data:  Chemistry Recent Labs  Lab 04/20/17 0948  NA 137  K 4.2  CL 103  CO2 19*  GLUCOSE 164*  BUN 16  CREATININE 0.56  CALCIUM 9.2  GFRNONAA >60  GFRAA >60  ANIONGAP 15    No results for input(s): PROT, ALBUMIN, AST, ALT, ALKPHOS, BILITOT in the last 168 hours. Hematology Recent Labs  Lab 04/20/17 0948  WBC 8.5  RBC 4.58  HGB 13.5  HCT 41.6  MCV 90.8  MCH 29.5  MCHC 32.5  RDW 14.6  PLT 311   Cardiac EnzymesNo results for input(s): TROPONINI in the last 168 hours. No results for input(s): TROPIPOC in the last 168 hours.  BNPNo results for input(s): BNP, PROBNP in the last 168 hours.  DDimer No results for input(s): DDIMER in the last 168 hours.  Radiology/Studies:  No results found.  Assessment and Plan:   Atrial fibrillation: This has likely been present since noted on EKG in 10/2016.  Rate is well controlled on Cardizem CD 120 mg daily and metoprolol 200 mg in the morning and 100 mg at night.  Patient was advised to follow-up with cardiology but this was never done.  She has not been anticoagulated. CHA2DS2/VAS Stroke Risk Score is 4 (HTN, Age, DM, female).  She has had no symptoms associated with atrial fib and has no awareness of being in A. fib.  She has never had an echo so LV function is unknown.  Continue current medications and advised to initiate anticoagulation when okay with surgeon.  Renal function is normal.  Recommend Eliquis 5 mg twice daily. Will need follow  up in our office. I will arrange.   Presurgical evaluation: Patient is planned for orthopedic surgery today.  She has no  prior history of known CAD.  She has had no exertional symptoms. She does have CVD risk factors including hypertension, diabetes, hyperlipidemia, and family history. She is able to perform 4 Mets of activity. Will get a stat echo and if no significant abnormalities, she is at acceptable risk for the planned surgery and advise to proceed.    Hypertension: Home medications include amlodipine 5 mg daily, clonidine 0.2 mg twice daily, diltiazem CD 120 mg daily, metoprolol tartrate 200 mg in the morning and 100 mg at night, quinapril 40 mg daily and Lasix 40 mg in the morning and 20 mg in the afternoon.  Blood pressures currently well controlled.  Hyperlipidemia: The patient is on simvastatin 40 mg daily.  Most recent LDL was 113 on 01/20/17.  Of note it was 54 earlier in the year.  Diabetes type 2: Patient takes metformin as well as [place], turmeric.  A1c was 6.8 on 04/20/17.  Well-controlled.     For questions or updates, please contact [place] Please consult [email or web address] for contact info under Cardiology/STEMI.   Signed, Daune Perch, NP  04/20/2017 11:02 AM

## 2017-04-20 NOTE — Progress Notes (Signed)
Zenda Alpers NP in to see pt informed her of Dr Lasandra Beech questions.

## 2017-04-20 NOTE — Anesthesia Procedure Notes (Signed)
Procedure Name: Intubation Date/Time: 04/20/2017 1:04 PM Performed by: Harden Mo, CRNA Pre-anesthesia Checklist: Patient identified, Emergency Drugs available, Suction available and Patient being monitored Patient Re-evaluated:Patient Re-evaluated prior to induction Oxygen Delivery Method: Circle System Utilized Preoxygenation: Pre-oxygenation with 100% oxygen Induction Type: IV induction Ventilation: Mask ventilation without difficulty Laryngoscope Size: Miller and 2 Grade View: Grade I Tube type: Oral Tube size: 7.5 mm Number of attempts: 1 Airway Equipment and Method: Stylet and Oral airway Placement Confirmation: ETT inserted through vocal cords under direct vision,  positive ETCO2 and breath sounds checked- equal and bilateral Secured at: 21 cm Tube secured with: Tape Dental Injury: Teeth and Oropharynx as per pre-operative assessment

## 2017-04-20 NOTE — Transfer of Care (Signed)
Immediate Anesthesia Transfer of Care Note  Patient: Ashley [name]  Procedure(s) Performed: OPEN REDUCTION INTERNAL FIXATION (ORIF) LEFT ANKLE FRACTURE (Left Ankle)  Patient Location: PACU  Anesthesia Type:General  Level of Consciousness: awake, alert  and oriented  Airway & Oxygen Therapy: Patient Spontanous Breathing and Patient connected to nasal cannula oxygen  Post-op Assessment: Report given to RN, Post -op Vital signs reviewed and stable and Patient moving all extremities X 4  Post vital signs: Reviewed and stable  Last Vitals:  Vitals:   04/20/17 0935  BP: 136/67  Pulse: (!) 50  Resp: 20  Temp: 36.6 C  SpO2: 98%    Last Pain:  Vitals:   04/20/17 0944  TempSrc:   PainSc: 7       Patients Stated Pain Goal: 2 (42/35/36 1443)  Complications: No apparent anesthesia complications

## 2017-04-20 NOTE — Anesthesia Preprocedure Evaluation (Addendum)
Anesthesia Evaluation  Patient identified by MRN, date of birth, ID band Patient awake    Reviewed: Allergy & Precautions, NPO status , Patient's Chart, lab work & pertinent test results  History of Anesthesia Complications Negative for: history of anesthetic complications  Airway Mallampati: I  TM Distance: <3 FB Neck ROM: Full    Dental  (+) Teeth Intact, Dental Advisory Given   Pulmonary neg pulmonary ROS,    breath sounds clear to auscultation       Cardiovascular hypertension, + dysrhythmias Atrial Fibrillation  Rhythm:Irregular Rate:Bradycardia  Card eval completed ,ECHO done good LV function. MR   Neuro/Psych negative psych ROS   GI/Hepatic negative GI ROS, Neg liver ROS,   Endo/Other  diabetesMorbid obesity  Renal/GU negative Renal ROS     Musculoskeletal   Abdominal (+) + obese,   Peds  Hematology negative hematology ROS (+)   Anesthesia Other Findings   Reproductive/Obstetrics                           Anesthesia Physical Anesthesia Plan  ASA: III  Anesthesia Plan: General   Post-op Pain Management:    Induction:   PONV Risk Score and Plan: 3 and Treatment may vary due to age or medical condition  Airway Management Planned: LMA  Additional Equipment:   Intra-op Plan:   Post-operative Plan: Extubation in OR  Informed Consent: I have reviewed the patients History and Physical, chart, labs and discussed the procedure including the risks, benefits and alternatives for the proposed anesthesia with the patient or authorized representative who has indicated his/her understanding and acceptance.   Dental advisory given  Plan Discussed with: CRNA  Anesthesia Plan Comments:        Anesthesia Quick Evaluation

## 2017-04-20 NOTE — H&P (Signed)
Ashley [name] is an 67 y.o. female.    Chief Complaint: left ankle pain  HPI: Pt is a 67 y.o. female complaining of left ankle pain after a fall 2 weeks ago. Pain had continually increased since the beginning. X-rays in the clinic show displaced fibula fracture left ankle. Pt has tried various conservative treatments which have failed to alleviate their symptoms. Various options are discussed with the patient. Risks, benefits and expectations were discussed with the patient. Patient understand the risks, benefits and expectations and wishes to proceed with surgery.   PCP:  Chevis Pretty, FNP  D/C Plans: Home  PMH: Past Medical History:  Diagnosis Date  . Depression   . Diabetes mellitus without complication ([place])   . Hyperlipidemia   . Hypertension     PSH: Past Surgical History:  Procedure Laterality Date  . CYST EXCISION Left 03/15/2017   from buttocks    Social History:  reports that  has never smoked. she has never used smokeless tobacco. She reports that she does not drink alcohol or use drugs.  Allergies:  Allergies  Allergen Reactions  . Ciprofloxacin Hcl Hives    Medications: No current facility-administered medications for this encounter.    Current Outpatient Medications  Medication Sig Dispense Refill  . acetaminophen (TYLENOL) 500 MG tablet Take 1,000 mg by mouth every 6 (six) hours as needed (for pain/headaches.).    Marland Kitchen ALPRAZolam (XANAX) 0.25 MG tablet Take 1 tablet (0.25 mg total) by mouth daily as needed for anxiety. 30 tablet 2  . amLODipine (NORVASC) 5 MG tablet TAKE 1 TABLET (5 MG TOTAL) BY MOUTH DAILY. 90 tablet 1  . CINNAMON PO Take 1 capsule by mouth daily.     . cloNIDine (CATAPRES) 0.2 MG tablet Take 1 tablet (0.2 mg total) by mouth 2 (two) times daily. 180 tablet 1  . diltiazem (CARDIZEM CD) 120 MG 24 hr capsule 90TAKE 1 CAPSULE BY MOUTH EVERY DAY (Patient taking differently: Take 120 mg by mouth daily. 90TAKE 1 CAPSULE BY MOUTH EVERY DAY)  90 capsule 1  . furosemide (LASIX) 40 MG tablet 1 po qam and 1/2 po in afternoon (Patient taking differently: Take 20-40 mg by mouth daily as needed for fluid. ) 135 tablet 1  . Ginger, Zingiber officinalis, (GINGER ROOT PO) Take 1 capsule by mouth daily.     . metFORMIN (GLUCOPHAGE) 1000 MG tablet Take 1 tablet (1,000 mg total) by mouth 2 (two) times daily with a meal. 180 tablet 1  . metoprolol tartrate (LOPRESSOR) 100 MG tablet TAKE TWO TABLETS BY MOUTH IN THE MORNING AND ONE AT BEDTIME (Patient taking differently: Take 100-200 mg by mouth 2 (two) times daily. TAKE TWO TABLETS BY MOUTH IN THE MORNING AND ONE AT BEDTIME) 270 tablet 1  . quinapril (ACCUPRIL) 40 MG tablet Take 1 tablet (40 mg total) by mouth daily. 90 tablet 1  . simvastatin (ZOCOR) 40 MG tablet TAKE 1 TABLET BY MOUTH EVERY DAY IN THE EVENING (Patient taking differently: Take 40 mg by mouth every evening. ) 90 tablet 1  . Turmeric Curcumin 500 MG CAPS Take 500 mg by mouth daily.     Marland Kitchen zolpidem (AMBIEN) 5 MG tablet Take 1 tablet (5 mg total) by mouth at bedtime as needed for sleep. 15 tablet 1  . amoxicillin-clavulanate (AUGMENTIN) 875-125 MG tablet Take 1 tablet by mouth 2 (two) times daily. (Patient not taking: Reported on 04/19/2017) 20 tablet 0  . aspirin 325 MG tablet Take 325 mg by mouth  daily at 2 PM.     . glucose blood (BAYER CONTOUR NEXT TEST) test strip Checks blood sugars 1x per day and prn.  E11.9 100 each 12  . HYDROcodone-homatropine (HYCODAN) 5-1.5 MG/5ML syrup Take 5 mLs by mouth every 6 (six) hours as needed for cough. 120 mL 0  . predniSONE (DELTASONE) 20 MG tablet 2 po at sametime daily for 5 days (Patient not taking: Reported on 04/19/2017) 10 tablet 0    No results found for this or any previous visit (from the past 48 hour(s)). No results found.  ROS: Pain with rom of the left lower extremity  Physical Exam:  Alert and oriented 67 y.o. female in no acute distress Cranial nerves 2-12 intact Cervical  spine: full rom with no tenderness, nv intact distally Chest: active breath sounds bilaterally, no wheeze rhonchi or rales Heart: regular rate and rhythm, no murmur Abd: non tender non distended with active bowel sounds Hip is stable with rom  Left ankle with mild edema Moderate tenderness to left lateral ankle Decreased rom due to pain and known fracture nv intact distally  Assessment/Plan Assessment: left distal fibula fracture  Plan: Patient will undergo a left ankle ORIF by Dr. Veverly Fells at [facility]. Risks benefits and expectations were discussed with the patient. Patient understand risks, benefits and expectations and wishes to proceed.  Merla Riches PA-C, MPAS [facility] is now [employer] [address], [place], [place], [place] [address] Phone: [phone] [email or web address] Facebook  [employer]

## 2017-04-20 NOTE — Brief Op Note (Signed)
04/20/2017  2:33 PM  PATIENT:  Ashley [name]  67 y.o. female  PRE-OPERATIVE DIAGNOSIS:  Left ankle fracture, displaced Weber B fibula fracture  POST-OPERATIVE DIAGNOSIS:  Left ankle fracture displaced Weber B fibula fracture  PROCEDURE:  Procedure(s): OPEN REDUCTION INTERNAL FIXATION (ORIF) LEFT ANKLE FRACTURE (Left)  SURGEON:  Surgeon(s) and Role:    Netta Cedars, MD - Primary  PHYSICIAN ASSISTANT:   ASSISTANTS: [place]ura Bruns, PA-C   ANESTHESIA:   regional and general  EBL:  20 mL   BLOOD ADMINISTERED:none  DRAINS: none   LOCAL MEDICATIONS USED:  NONE  SPECIMEN:  No Specimen  DISPOSITION OF SPECIMEN:  N/A  COUNTS:  YES  TOURNIQUET:   Total Tourniquet Time Documented: Thigh (Left) - 41 minutes Total: Thigh (Left) - 41 minutes   DICTATION: .Other Dictation: Dictation Number [id number]  PLAN OF CARE: Admit to inpatient   PATIENT DISPOSITION:  PACU - hemodynamically stable.   Delay start of Pharmacological VTE agent (>24hrs) due to surgical blood loss or risk of bleeding: yes

## 2017-04-20 NOTE — Op Note (Signed)
Ashley [name], Ashley [name]                 ACCOUNT NO.:  [id number]  MEDICAL RECORD NO.:  [id number]  LOCATION:  [facility]                         FACILITY:  [facility]  PHYSICIAN:  Doran Heater. Veverly Fells, M.D. DATE OF BIRTH:  Aug 12, 1950  DATE OF PROCEDURE:  04/20/2017 DATE OF DISCHARGE:                              OPERATIVE REPORT   PREOPERATIVE DIAGNOSIS:  Displaced left Weber B ankle fracture.  POSTOPERATIVE DIAGNOSIS:  Displaced left Weber B ankle fracture.  PROCEDURE PERFORMED:  Open reduction and internal fixation of displaced Weber B ankle fracture.  ATTENDING SURGEON:  Doran Heater. Veverly Fells, M.D.  ASSISTANT:  Charletta Cousin [place], [place], who was scrubbed during the entire procedure and necessary for satisfactory completion of surgery.  ANESTHESIA:  General anesthesia was used.  ESTIMATED BLOOD LOSS:  Minimal.  FLUID REPLACED:  1000 mL of crystalloid.  INSTRUMENT COUNTS:  Correct.  COMPLICATIONS:  There were no complications.  TOURNIQUET TIME:  41 minutes at 300 mm and there was a thigh tourniquet.  INDICATIONS:  The patient is a 67 year old female who suffered a Weber B ankle fracture with an initially preserved mortise alignment and reasonably aligned fracture.  She was treated conservatively with a short-leg cast.  The patient admitted to some weightbearing on that cast and unfortunately displaced her fracture over the first 2 weeks and presents now with a displaced mortise and shortened and rotated distal fibula fracture fragment.  Given the change in her fracture alignment, the likelihood of this progressed to a malunion or nonunion, we discussed with the patient options for management recommending ORIF. Risks and benefits of surgery were discussed in detail with the patient and informed consent was obtained.  DESCRIPTION OF PROCEDURE:  After an adequate level of anesthesia achieved, the patient was positioned in supine position.  The left leg was correctly identified.  Nonsterile  tourniquet was placed on proximal thigh.  Left leg was sterilely prepped and draped in usual manner.  Time- out was called verifying correct patient and correct site.  We elevated the leg, exsanguinated using an Esmarch bandage, and elevated the tourniquet to 300 mmHg.  A longitudinal lateral incision was created over the subcutaneous fibula, centered on the fracture site.  Dissection carried sharply down through the subcutaneous tissues to bone into periosteum with the 15 blade scalpel.  Identified the fractured fibula. There was significant displacement greater than 3 mm of lateral displacement and some shortening.  We spent some time cleaning up the fracture site with a curette and rongeur and a bone pick.  We also slipped into the anterior ankle joint using a Soil scientist, removed some scar tissue from the anterolateral gutter and made sure that we had complete visualization of this fracture.  We then used a combination of distal traction, internal rotation and anatomically reduced the ankle fracture.  We used a crab claw clamp to hold the fracture in place and then placed an anterior to posterior AO compression screw using lag technique with a 3.5 drill bit anteriorly and then 2.5 for the posterior cortex.  Bone quality was not good.  We achieved decent compression, but not great purchase with that screw.  It was of adequate length.  We then decided based on the anatomy of her fibula which was a twisting anatomy that she would be best served with her bone quality with a posterior antiglide plate which we applied using a 5-hole 1/3 tubular plate of the Biomet set.  We applied the plate to the appropriate position, which held the fracture in place.  We then placed 2 bicortical 3.5 screws, getting good purchase of the first screw very close to the fracture site for proper antiglide technique.  This held the fracture anatomically reduced.  The ankle mortise looked perfect on 2-view  C-arm.  Once we verified the mortise reduction and the hardware placement, we irrigated thoroughly and closed in layers with 0 Vicryl on deep fascia layer, 2-0 Vicryl for subcutaneous closure and staples for skin.  Sterile dressing applied followed by short-leg splint.  The patient was awakened and taken in stable condition to the recovery room, having tolerated surgery well.     Doran Heater. Veverly Fells, M.D.     SRN/MEDQ  D:  04/20/2017  T:  04/20/2017  Job:  [id number]

## 2017-04-21 ENCOUNTER — Encounter (HOSPITAL_COMMUNITY): Payer: Self-pay | Admitting: Orthopedic Surgery

## 2017-04-21 LAB — GLUCOSE, CAPILLARY
GLUCOSE-CAPILLARY: 155 mg/dL — AB (ref 65–99)
Glucose-Capillary: 127 mg/dL — ABNORMAL HIGH (ref 65–99)
Glucose-Capillary: 137 mg/dL — ABNORMAL HIGH (ref 65–99)
Glucose-Capillary: 110 mg/dL — ABNORMAL HIGH (ref 65–99)

## 2017-04-21 LAB — BASIC METABOLIC PANEL
Anion gap: 12 (ref 5–15)
BUN: 18 mg/dL (ref 6–20)
CALCIUM: 8.9 mg/dL (ref 8.9–10.3)
CO2: 24 mmol/L (ref 22–32)
CREATININE: 0.62 mg/dL (ref 0.44–1.00)
Chloride: 99 mmol/L — ABNORMAL LOW (ref 101–111)
GFR calc Af Amer: >60 mL/min (ref >60)
GFR calc non Af Amer: >60 mL/min (ref >60)
GLUCOSE: 135 mg/dL — AB (ref 65–99)
Potassium: 4 mmol/L (ref 3.5–5.1)
Sodium: 135 mmol/L (ref 135–145)

## 2017-04-21 NOTE — Evaluation (Signed)
Physical Therapy Evaluation Patient Details Name: Ashley [name] MRN: [id number] DOB: Jan 26, 1951 Today's Date: 04/21/2017   History of Present Illness  67 y.o. female s/p L ankle ORIF after fall at home. PMH include: HTN, DM, hyperlipidemia, Obesity, A-Fib  Clinical Impression  Patient is s/p above surgery resulting in functional limitations due to the deficits listed below (see PT Problem List). PTA, patient lives alone in 1 story home with level entrance was independent with mobility without AD. Patient has family that lives local. Upon eval pt presents with post op pain and has limited mobility due to obesity and NWB status. Patient able to take short hops in hospital room with min guard, but c/o of ankle R ankle instability and fatigues very quickly. Given quality of gait feel patient presents as falls risk and would benefit from 24 hour supervision and continued physical therapy after medically d/c. Recommending sub acute therapy although patient may benefit from Home First program if eligbale.  Patient will benefit from skilled PT to increase their independence and safety with mobility to allow discharge to the venue listed below.       Follow Up Recommendations SNF;Supervision/Assistance - 24 hour    Equipment Recommendations  Rolling walker with 5" wheels;3in1 (PT)(Bari sized)    Recommendations for Other Services       Precautions / Restrictions Precautions Precautions: Fall Restrictions Weight Bearing Restrictions: Yes LLE Weight Bearing: Non weight bearing      Mobility  Bed Mobility Overal bed mobility: Needs Assistance Bed Mobility: Supine to Sit     Supine to sit: Supervision        Transfers Overall transfer level: Needs assistance   Transfers: Sit to/from Stand;Stand Pivot Transfers Sit to Stand: Min guard Stand pivot transfers: Min guard          Ambulation/Gait Ambulation/Gait assistance: Min assist;+2 physical assistance Ambulation Distance (Feet):  15 Feet Assistive device: Rolling walker (2 wheeled) Gait Pattern/deviations: Step-to pattern;Antalgic Gait velocity: decreased   General Gait Details: Hop to gait, fatigues early, min A to most to stabilize.   Stairs            Wheelchair Mobility    Modified Rankin (Stroke Patients Only)       Balance Overall balance assessment: Needs assistance Sitting-balance support: Feet unsupported;No upper extremity supported Sitting balance-Leahy Scale: Good     Standing balance support: During functional activity;Bilateral upper extremity supported Standing balance-Leahy Scale: Poor Standing balance comment: Reliant on RW for balance due to NWB status                             Pertinent Vitals/Pain Pain Assessment: Faces Faces Pain Scale: Hurts even more Pain Location: L ankle Pain Descriptors / Indicators: Aching;Contraction;Discomfort;Grimacing;Operative site guarding Pain Intervention(s): Limited activity within patient's tolerance;Monitored during session;Premedicated before session;Repositioned    Home Living Family/patient expects to be discharged to:: Private residence Living Arrangements: Alone Available Help at Discharge: Family Type of Home: House Home Access: Ramped entrance     Home Layout: One level Home Equipment: Environmental consultant - 2 wheels      Prior Function Level of Independence: Independent               Hand Dominance        Extremity/Trunk Assessment        Lower Extremity Assessment Lower Extremity Assessment: Overall WFL for tasks assessed       Communication   Communication: No difficulties  Cognition Arousal/Alertness: Awake/alert Behavior During Therapy: WFL for tasks assessed/performed Overall Cognitive Status: Within Functional Limits for tasks assessed                                        General Comments      Exercises     Assessment/Plan    PT Assessment Patient needs continued PT  services  PT Problem List Decreased strength;Decreased range of motion;Decreased activity tolerance;Decreased balance;Decreased mobility;Decreased coordination;Pain;Obesity       PT Treatment Interventions DME instruction;Stair training;Gait training;Functional mobility training;Therapeutic activities;Therapeutic exercise;Balance training    PT Goals (Current goals can be found in the Care Plan section)  Acute Rehab PT Goals Patient Stated Goal: return home but open for rehab  PT Goal Formulation: With patient Time For Goal Achievement: 04/28/17 Potential to Achieve Goals: Good    Frequency Min 5X/week   Barriers to discharge Decreased caregiver support Lives alone    Co-evaluation PT/OT/SLP Co-Evaluation/Treatment: Yes Reason for Co-Treatment: For patient/therapist safety PT goals addressed during session: Mobility/safety with mobility;Balance;Proper use of DME;Strengthening/ROM         AM-PAC PT "6 Clicks" Daily Activity  Outcome Measure Difficulty turning over in bed (including adjusting bedclothes, sheets and blankets)?: A Little Difficulty moving from lying on back to sitting on the side of the bed? : A Little Difficulty sitting down on and standing up from a chair with arms (e.g., wheelchair, bedside commode, etc,.)?: A Little Help needed moving to and from a bed to chair (including a wheelchair)?: A Little Help needed walking in hospital room?: A Lot Help needed climbing 3-5 steps with a railing? : Total 6 Click Score: 15    End of Session Equipment Utilized During Treatment: Gait belt Activity Tolerance: Patient limited by fatigue Patient left: in chair;with call bell/phone within reach Nurse Communication: Mobility status      Time: 1030-1104 PT Time Calculation (min) (ACUTE ONLY): 34 min   Charges:   PT Evaluation $PT Eval Moderate Complexity: 1 Mod     PT G Codes:        Reinaldo Berber, PT, DPT Acute Rehab Services Pager: [phone]    Reinaldo Berber 04/21/2017, 11:09 AM

## 2017-04-21 NOTE — Plan of Care (Signed)
  Progressing Health Behavior/Discharge Planning: Ability to manage health-related needs will improve 04/21/2017 1614 - Progressing by Candida Peeling, RN Clinical Measurements: Ability to maintain clinical measurements within normal limits will improve 04/21/2017 1614 - Progressing by Candida Peeling, RN Respiratory complications will improve 04/21/2017 1614 - Progressing by Candida Peeling, RN Coping: Level of anxiety will decrease 04/21/2017 1614 - Progressing by Candida Peeling, RN Elimination: Will not experience complications related to bowel motility 04/21/2017 1614 - Progressing by Candida Peeling, RN Will not experience complications related to urinary retention 04/21/2017 1614 - Progressing by Candida Peeling, RN Pain Managment: General experience of comfort will improve 04/21/2017 1614 - Progressing by Candida Peeling, RN Safety: Ability to remain free from injury will improve 04/21/2017 1614 - Progressing by Candida Peeling, RN

## 2017-04-21 NOTE — Progress Notes (Signed)
Called by RN re: bradycardia  Postoperatively, her heart rate had dropped a little below 30, and is sustaining in the 40s.  Her blood pressure decreased as well, but systolic is greater than 103. She had her home medications this morning including Lopressor 200 mg and Cardizem CD 120 mg.  It is likely that the anesthesia has not worn off completely.  I advised that I do not feel that external pacing is indicated at this time.  If she develops symptomatic bradycardia, consider dopamine drip.    However, the anesthesia will wear off and I believe her heart rate will improve..  I am not changing her doses of Lopressor or Cardizem as they are her home medications.  Call for symptomatic bradycardia or for heart rate sustained less than 35.  Rosaria Ferries, PA-C 04/21/2017 2:36 PM Beeper [phone]

## 2017-04-21 NOTE — Progress Notes (Signed)
Md  Paged regarding heart rate.

## 2017-04-21 NOTE — Evaluation (Signed)
Occupational Therapy Evaluation Patient Details Name: Ashley [name] MRN: [id number] DOB: 08-Aug-1950 Today's Date: 04/21/2017    History of Present Illness 67 y.o. female s/p L ankle ORIF after fall at home. PMH include: HTN, DM, hyperlipidemia, Obesity, A-Fib   Clinical Impression   Pt admitted with the above diagnoses and presents with below problem list. Pt will benefit from continued acute OT to address the below listed deficits and maximize independence with ADLs prior to d/c to venue below. PTA pt was independent with ADLs. Pt is currently min A +2 for short distance functional mobility, mod A for LB ADLs. Pt fatigued quickly during session. Will greatly benefit from further therapy. Recommending ST SNF stay for rehab although patient may benefit from [facility] program if eligbile.       Follow Up Recommendations  SNF    Equipment Recommendations  Other (comment)(defer to next venue)    Recommendations for Other Services       Precautions / Restrictions Precautions Precautions: Fall Restrictions Weight Bearing Restrictions: Yes LLE Weight Bearing: Non weight bearing      Mobility Bed Mobility Overal bed mobility: Needs Assistance Bed Mobility: Supine to Sit     Supine to sit: Supervision        Transfers Overall transfer level: Needs assistance   Transfers: Sit to/from Stand;Stand Pivot Transfers Sit to Stand: Min guard Stand pivot transfers: Min guard            Balance Overall balance assessment: Needs assistance Sitting-balance support: Feet unsupported;No upper extremity supported Sitting balance-Leahy Scale: Good     Standing balance support: During functional activity;Bilateral upper extremity supported Standing balance-Leahy Scale: Poor Standing balance comment: Reliant on RW for balance due to NWB status                           ADL either performed or assessed with clinical judgement   ADL Overall ADL's : Needs  assistance/impaired Eating/Feeding: Set up;Sitting   Grooming: Set up;Sitting   Upper Body Bathing: Set up;Sitting   Lower Body Bathing: Moderate assistance;Sit to/from stand Lower Body Bathing Details (indicate cue type and reason): Pt able to static stand with BUE support of rw. Assist for tasks in standing position.  Upper Body Dressing : Set up   Lower Body Dressing: Maximal assistance;Sit to/from stand Lower Body Dressing Details (indicate cue type and reason): Pt able to static stand with BUE support of rw. Assist for tasks in standing position.  Toilet Transfer: Minimal assistance;+2 for physical assistance;Ambulation;BSC;RW;Requires wide/bariatric   Toileting- Clothing Manipulation and Hygiene: Moderate assistance;Sit to/from stand Toileting - Clothing Manipulation Details (indicate cue type and reason): Pt able to static stand with BUE support of rw. Assist for tasks in standing position.  Tub/ Shower Transfer: Walk-in shower;Minimal assistance;+2 for physical assistance;Ambulation;3 in 1;Rolling walker Tub/Shower Transfer Details (indicate cue type and reason): would likely need 1 seated rest break to walk distance to shower at home.  Functional mobility during ADLs: Minimal assistance;+2 for physical assistance;Rolling walker General ADL Comments: Pt completed bed mobility and in-room functional mobility. SPT EOB>BSC.     Vision         Perception     Praxis      Pertinent Vitals/Pain Pain Assessment: Faces Faces Pain Scale: Hurts even more Pain Location: L ankle Pain Descriptors / Indicators: Aching;Contraction;Discomfort;Grimacing;Operative site guarding Pain Intervention(s): Limited activity within patient's tolerance;Monitored during session;Premedicated before session;Repositioned     Hand Dominance  Extremity/Trunk Assessment Upper Extremity Assessment Upper Extremity Assessment: Generalized weakness;LUE deficits/detail LUE Deficits / Details: pt  reports rotator cuff injury, received an injection last year   Lower Extremity Assessment Lower Extremity Assessment: Defer to PT evaluation       Communication Communication Communication: No difficulties   Cognition Arousal/Alertness: Awake/alert Behavior During Therapy: WFL for tasks assessed/performed Overall Cognitive Status: Within Functional Limits for tasks assessed                                     General Comments       Exercises     Shoulder Instructions      Home Living Family/patient expects to be discharged to:: Private residence Living Arrangements: Alone Available Help at Discharge: Family Type of Home: House Home Access: Ramped entrance     Home Layout: One level     Bathroom Shower/Tub: Teacher, early years/pre: Standard Bathroom Accessibility: Yes   Home Equipment: Walker - 2 wheels          Prior Functioning/Environment Level of Independence: Independent                 OT Problem List: Impaired balance (sitting and/or standing);Decreased activity tolerance;Decreased knowledge of use of DME or AE;Decreased knowledge of precautions;Obesity;Pain      OT Treatment/Interventions: Self-care/ADL training;DME and/or AE instruction;Therapeutic exercise;Therapeutic activities;Patient/family education;Balance training;Energy conservation    OT Goals(Current goals can be found in the care plan section) Acute Rehab OT Goals Patient Stated Goal: return home but open for rehab  OT Goal Formulation: With patient Time For Goal Achievement: 04/28/17 Potential to Achieve Goals: Good ADL Goals Pt Will Perform Lower Body Bathing: with min guard assist;sit to/from stand Pt Will Perform Lower Body Dressing: with min guard assist;sit to/from stand Pt Will Transfer to Toilet: with min guard assist;ambulating Pt Will Perform Toileting - Clothing Manipulation and hygiene: with min guard assist;sit to/from stand Pt Will Perform  Tub/Shower Transfer: with min guard assist;ambulating;3 in 1;rolling walker  OT Frequency: Min 2X/week   Barriers to D/C:    family not present to confirm the amount and type of assist they can provide       Co-evaluation PT/OT/SLP Co-Evaluation/Treatment: Yes Reason for Co-Treatment: For patient/therapist safety PT goals addressed during session: Mobility/safety with mobility;Balance;Proper use of DME;Strengthening/ROM OT goals addressed during session: ADL's and self-care      AM-PAC PT "6 Clicks" Daily Activity     Outcome Measure Help from another person eating meals?: None Help from another person taking care of personal grooming?: None Help from another person toileting, which includes using toliet, bedpan, or urinal?: A Lot Help from another person bathing (including washing, rinsing, drying)?: A Lot Help from another person to put on and taking off regular upper body clothing?: None Help from another person to put on and taking off regular lower body clothing?: A Lot 6 Click Score: 18   End of Session Equipment Utilized During Treatment: Gait belt;Rolling walker  Activity Tolerance: Patient tolerated treatment well;Patient limited by fatigue Patient left: in chair;with call bell/phone within reach  OT Visit Diagnosis: Unsteadiness on feet (R26.81);Pain;Muscle weakness (generalized) (M62.81) Pain - Right/Left: Left Pain - part of body: Ankle and joints of foot                Time: 8657-8469 OT Time Calculation (min): 32 min Charges:  OT General Charges $OT Visit: 1  Visit OT Evaluation $OT Eval Moderate Complexity: 1 Mod G-Codes:       Hortencia Pilar 04/21/2017, 11:35 AM

## 2017-04-21 NOTE — Progress Notes (Signed)
Orthopedics Progress Note  Subjective: Patient reports doing well from pain standpoint with occasional pain spikes.  She has been up with therapy and has been elevating well.  Objective:  Vitals:   04/21/17 0946 04/21/17 1428  BP: (!) 161/58 104/65  Pulse: 72 (!) 42  Resp:    Temp:    SpO2:  97%    General: Awake and alert  Musculoskeletal: Left ankle splint in good repair, wiggles toes without pain Neurovascularly intact  Lab Results  Component Value Date   WBC 8.5 04/20/2017   HGB 13.5 04/20/2017   HCT 41.6 04/20/2017   MCV 90.8 04/20/2017   PLT 311 04/20/2017       Component Value Date/Time   NA 135 04/21/2017 0644   NA 142 01/20/2017 1540   K 4.0 04/21/2017 0644   CL 99 (L) 04/21/2017 0644   CO2 24 04/21/2017 0644   GLUCOSE 135 (H) 04/21/2017 0644   BUN 18 04/21/2017 0644   BUN 13 01/20/2017 1540   CREATININE 0.62 04/21/2017 0644   CALCIUM 8.9 04/21/2017 0644   GFRNONAA >60 04/21/2017 0644   GFRAA >60 04/21/2017 0644    No results found for: INR, PROTIME  Assessment/Plan: POD #1 s/p Procedure(s): OPEN REDUCTION INTERNAL FIXATION (ORIF) LEFT ANKLE FRACTURE Change to inpatient status to monitor bradycardia.  Anticipate short term SNF for safety as patient does not have adequate home support D/C planning DVT prophylaxis - ASA and mechanical   Doran Heater. Veverly Fells, MD 04/21/2017 5:42 PM

## 2017-04-22 LAB — BASIC METABOLIC PANEL
Anion gap: 10 (ref 5–15)
BUN: 12 mg/dL (ref 6–20)
CHLORIDE: 101 mmol/L (ref 101–111)
CO2: 26 mmol/L (ref 22–32)
CREATININE: 0.47 mg/dL (ref 0.44–1.00)
Calcium: 8.8 mg/dL — ABNORMAL LOW (ref 8.9–10.3)
GFR calc Af Amer: >60 mL/min (ref >60)
GFR calc non Af Amer: >60 mL/min (ref >60)
Glucose, Bld: 112 mg/dL — ABNORMAL HIGH (ref 65–99)
Potassium: 3.9 mmol/L (ref 3.5–5.1)
SODIUM: 137 mmol/L (ref 135–145)

## 2017-04-22 LAB — GLUCOSE, CAPILLARY
Glucose-Capillary: 118 mg/dL — ABNORMAL HIGH (ref 65–99)
Glucose-Capillary: 153 mg/dL — ABNORMAL HIGH (ref 65–99)
Glucose-Capillary: 138 mg/dL — ABNORMAL HIGH (ref 65–99)
Glucose-Capillary: 118 mg/dL — ABNORMAL HIGH (ref 65–99)

## 2017-04-22 MED ORDER — METOPROLOL TARTRATE 50 MG PO TABS
50.0000 mg | ORAL_TABLET | Freq: Two times a day (BID) | ORAL | Status: DC
  Administered 2017-04-23 – 2017-04-25 (×5): 50 mg via ORAL
  Filled 2017-04-22 (×5): qty 1

## 2017-04-22 MED ORDER — METOPROLOL TARTRATE 100 MG PO TABS
100.0000 mg | ORAL_TABLET | Freq: Two times a day (BID) | ORAL | Status: DC
  Administered 2017-04-22: 100 mg via ORAL

## 2017-04-22 NOTE — Progress Notes (Signed)
   Called by RN regarding bradycardia:  Patient got metoprolol 200 mg yesterday and heart rate dropped into the 30s at times.  She had surgery yesterday and there was concerned that part of the bradycardia was due to after effects of anesthesia.  She did not get her normal dose of metoprolol 100 mg last p.m. because of the bradycardia.  Overnight, she had heart rates in the 40s and 50s  Today, her heart rate in the 60s and then RN is concerned that if we give her the full dose of metoprolol, her heart rate will drop too low.  I cut the metoprolol back to 100 mg twice daily.  She will be seen on rounds and determination can be made if further med changes are needed.  Rosaria Ferries, PA-C 04/22/2017 10:05 AM Beeper [phone]

## 2017-04-22 NOTE — Care Management Note (Signed)
Case Management Note  Patient Details  Name: Ashley [name] MRN: [id number] Date of Birth: [birth year]  Subjective/Objective:                 Spoke to patient at the bedside. She states that she lives at home alone. She is admitted after ankle fx. PT rec SNF and patient states that she is agreeable to SNF at DC. CSW following for SNF placement.  Patient states she has BSC and RW at home. She was previously pre-assigned to [facility] if appropriate for home DC.    Action/Plan:   Expected Discharge Date:                  Expected Discharge Plan:  Skilled Nursing Facility  In-House Referral:  Clinical Social Work  Discharge planning Services  CM Consult  Post Acute Care Choice:    Choice offered to:  Patient  DME Arranged:    DME Agency:     HH Arranged:    [place] Agency:     Status of Service:  In process, will continue to follow  If discussed at Long Length of Stay Meetings, dates discussed:    Additional Comments:  Carles Collet, RN 04/22/2017, 2:41 PM

## 2017-04-22 NOTE — Progress Notes (Signed)
     Called again re: bradycardia  Pt HR in the 30s, pt got metoprolol 100 mg this am.  Will decrease dose further, to 50 mg bid.  Continue to follow HR. No interventions as long as she is asymptomatic.  Rosaria Ferries, PA-C 04/22/2017 3:14 PM Beeper [phone]

## 2017-04-22 NOTE — NC FL2 (Signed)
[place] LEVEL OF CARE SCREENING TOOL     IDENTIFICATION  Patient Name: Ashley [name] Birthdate: 1951/01/03 Sex: female Admission Date (Current Location): 04/20/2017  [facility] and [place] Number:  Herbalist and Address:  [facility] [place], [place] [address] [place], [place] [address]      Provider Number: [id number]  Attending Physician Name and Address:  Netta Cedars, MD  Relative Name and Phone Number:       Current Level of Care: Hospital Recommended Level of Care: [place] Prior Approval Number:    Date Approved/Denied:   PASRR Number: [id number]  Discharge Plan: SNF    Current Diagnoses: Patient Active Problem List   Diagnosis Date Noted  . Status post ORIF of fracture of ankle 04/20/2017  . Preoperative cardiovascular examination   . Persistent atrial fibrillation ([place])   . Severe obesity (BMI >= 40) ([place]) 07/30/2014  . Hyperlipidemia 05/31/2010  . Hypertension 05/31/2010  . Diabetes ([place]) 05/31/2010  . Peripheral edema 05/31/2010    Orientation RESPIRATION BLADDER Height & Weight     Self, Time, Situation, Place  Normal Continent Weight: 261 lb (118.4 kg) Height:  5' 2.5" (158.8 cm)  BEHAVIORAL SYMPTOMS/MOOD NEUROLOGICAL BOWEL NUTRITION STATUS      Continent Diet(carb modified)  AMBULATORY STATUS COMMUNICATION OF NEEDS Skin   Limited Assist Verbally Surgical wounds(closed left ankle incision, compression wrap dressing)                       Personal Care Assistance Level of Assistance  Bathing, Feeding, Dressing Bathing Assistance: Limited assistance Feeding assistance: Independent Dressing Assistance: Limited assistance     Functional Limitations Info  Sight, Hearing, Speech Sight Info: Adequate Hearing Info: Adequate Speech Info: Adequate    SPECIAL CARE FACTORS FREQUENCY  PT (By licensed PT), OT (By licensed OT)     PT Frequency: 5x/wk OT Frequency: 5x/wk             Contractures Contractures Info: Not present    Additional Factors Info  Code Status, Allergies, Insulin Sliding Scale Code Status Info: Full Allergies Info: Ciprofloxacin Hcl   Insulin Sliding Scale Info: 0-15 units 3x/day with meals; 4 units 3x/day with meals; 0-5 units daily at bed       Current Medications (04/22/2017):  This is the current hospital active medication list Current Facility-Administered Medications  Medication Dose Route Frequency Provider Last Rate Last Dose  . 0.9 %  sodium chloride infusion   Intravenous Continuous Netta Cedars, MD 10 mL/hr at 04/21/17 [phone]    . acetaminophen (TYLENOL) tablet 1,000 mg  1,000 mg Oral Q6H PRN Netta Cedars, MD      . acetaminophen (TYLENOL) tablet 325-650 mg  325-650 mg Oral Q6H PRN Netta Cedars, MD      . ALPRAZolam Duanne Moron) tablet 0.25 mg  0.25 mg Oral Daily PRN Netta Cedars, MD   0.25 mg at 04/21/17 1126  . amLODipine (NORVASC) tablet 5 mg  5 mg Oral Daily Netta Cedars, MD   5 mg at 04/22/17 1005  . aspirin tablet 325 mg  325 mg Oral Q1400 Netta Cedars, MD   325 mg at 04/21/17 2300  . atorvastatin (LIPITOR) tablet 20 mg  20 mg Oral q1800 Netta Cedars, MD   20 mg at 04/21/17 1707  . bisacodyl (DULCOLAX) suppository 10 mg  10 mg Rectal Daily PRN Netta Cedars, MD      . cloNIDine (CATAPRES) tablet 0.2 mg  0.2 mg Oral BID Netta Cedars, MD   0.2 mg at 04/22/17 1005  . diltiazem (CARDIZEM CD) 24 hr capsule 120 mg  120 mg Oral Daily Netta Cedars, MD   120 mg at 04/22/17 1007  . docusate sodium (COLACE) capsule 100 mg  100 mg Oral BID Netta Cedars, MD   100 mg at 04/22/17 1007  . HYDROcodone-acetaminophen (NORCO) 7.5-325 MG per tablet 1-2 tablet  1-2 tablet Oral Q4H PRN Netta Cedars, MD   2 tablet at 04/20/17 2230  . HYDROcodone-acetaminophen (NORCO/VICODIN) 5-325 MG per tablet 1-2 tablet  1-2 tablet Oral Q4H PRN Netta Cedars, MD   2 tablet at 04/22/17 1005  . insulin aspart (novoLOG) injection 0-15 Units  0-15 Units  Subcutaneous TID WC Netta Cedars, MD   3 Units at 04/21/17 1701  . insulin aspart (novoLOG) injection 0-5 Units  0-5 Units Subcutaneous QHS Netta Cedars, MD      . insulin aspart (novoLOG) injection 4 Units  4 Units Subcutaneous TID WC Netta Cedars, MD   4 Units at 04/22/17 0815  . metFORMIN (GLUCOPHAGE) tablet 1,000 mg  1,000 mg Oral BID WC Netta Cedars, MD   1,000 mg at 04/22/17 0814  . methocarbamol (ROBAXIN) tablet 500 mg  500 mg Oral Q6H PRN Netta Cedars, MD   500 mg at 04/22/17 1051   Or  . methocarbamol (ROBAXIN) 500 mg in dextrose 5 % 50 mL IVPB  500 mg Intravenous Q6H PRN Netta Cedars, MD      . metoCLOPramide (REGLAN) tablet 5-10 mg  5-10 mg Oral Q8H PRN Netta Cedars, MD       Or  . metoCLOPramide (REGLAN) injection 5-10 mg  5-10 mg Intravenous Q8H PRN Netta Cedars, MD      . metoprolol tartrate (LOPRESSOR) tablet 100 mg  100 mg Oral BID Barrett, Rhonda G, PA-C   100 mg at 04/22/17 1054  . morphine 2 MG/ML injection 0.5-1 mg  0.5-1 mg Intravenous Q2H PRN Netta Cedars, MD   1 mg at 04/21/17 0443  . ondansetron (ZOFRAN) tablet 4 mg  4 mg Oral Q6H PRN Netta Cedars, MD       Or  . ondansetron [facility]) injection 4 mg  4 mg Intravenous Q6H PRN Netta Cedars, MD      . polyethylene glycol (MIRALAX / GLYCOLAX) packet 17 g  17 g Oral Daily PRN Netta Cedars, MD      . quinapril (ACCUPRIL) tablet 40 mg  40 mg Oral Daily Netta Cedars, MD   40 mg at 04/22/17 1008  . traMADol (ULTRAM) tablet 50 mg  50 mg Oral Q6H Netta Cedars, MD   50 mg at 04/22/17 0530     Discharge Medications: Please see discharge summary for a list of discharge medications.  Relevant Imaging Results:  Relevant Lab Results:   Additional Information SS#: [id number]  Geralynn Ochs, LCSW

## 2017-04-22 NOTE — Progress Notes (Signed)
    Subjective: 2 Days Post-Op Procedure(s) (LRB): OPEN REDUCTION INTERNAL FIXATION (ORIF) LEFT ANKLE FRACTURE (Left) Patient reports pain as 3 on 0-10 scale.   Denies CP or SOB.  Voiding without difficulty. Positive flatus. Objective: Vital signs in last 24 hours: Temp:  [97.6 F (36.4 C)-98.4 F (36.9 C)] 97.7 F (36.5 C) (03/16 0744) Pulse Rate:  [42-85] 85 (03/16 0744) Resp:  [16] 16 (03/16 0744) BP: (104-161)/(53-67) 142/60 (03/16 0744) SpO2:  [94 %-97 %] 94 % (03/16 0619)  Intake/Output from previous day: 03/15 0701 - 03/16 0700 In: 678 [P.O.:480; I.V.:198] Out: 1100 [Urine:1100] Intake/Output this shift: No intake/output data recorded.  Labs: Recent Labs    04/20/17 0948  HGB 13.5   Recent Labs    04/20/17 0948  WBC 8.5  RBC 4.58  HCT 41.6  PLT 311   Recent Labs    04/21/17 0644 04/22/17 0525  NA 135 137  K 4.0 3.9  CL 99* 101  CO2 24 26  BUN 18 12  CREATININE 0.62 0.47  GLUCOSE 135* 112*  CALCIUM 8.9 8.8*   No results for input(s): LABPT, INR in the last 72 hours.  Physical Exam: Neurologically intact Sensation intact distally Incision: dressing C/D/I and splint in good condition Compartment soft Body mass index is 46.98 kg/m.   Assessment/Plan: 2 Days Post-Op Procedure(s) (LRB): OPEN REDUCTION INTERNAL FIXATION (ORIF) LEFT ANKLE FRACTURE (Left) Advance diet Up with therapy Discharge to SNF when bed avaliable  Shanica Castellanos D for Dr. Melina Schools [facility] [phone] 04/22/2017, 8:31 AM

## 2017-04-22 NOTE — Progress Notes (Signed)
Patient s/p ORIF of left ankle 3/14.  Her BP this am was 140-150/60 AF 60-70.  RN spoke with Cardiologist this am regarding medications.  Lopressor was decreased from 200-100mg  PO.  About 10am she received 5mg  Norvasc, 0.2mg  Catapres, 120mg  Cardizem CD, and 100mg  Lopressor PO.  Since about 2pm her HR has been 30-40s.  BP 94/49  Now 120/60.  She is asymptomatic, denies, dizziness, CP or SOB.  She is fully alert and oriented, talkative.  Rhonda Barrett notified of patient status.  Plan continue to monitor HR and decrease lopressor to 50mg  BID.  Will continue to follow patient.  RN to call if HR sustains below 30 or if patient becomes symptomatic.

## 2017-04-22 NOTE — Progress Notes (Signed)
Progress Note  Patient Name: Ashley [name] Date of Encounter: 04/22/2017  Primary Cardiologist:   No primary care provider on file.   Subjective   Leg pain but no palpitations, presyncope or syncope  Inpatient Medications    Scheduled Meds: . amLODipine  5 mg Oral Daily  . aspirin  325 mg Oral Q1400  . atorvastatin  20 mg Oral q1800  . cloNIDine  0.2 mg Oral BID  . diltiazem  120 mg Oral Daily  . docusate sodium  100 mg Oral BID  . insulin aspart  0-15 Units Subcutaneous TID WC  . insulin aspart  0-5 Units Subcutaneous QHS  . insulin aspart  4 Units Subcutaneous TID WC  . metFORMIN  1,000 mg Oral BID WC  . metoprolol tartrate  100 mg Oral BID  . quinapril  40 mg Oral Daily  . traMADol  50 mg Oral Q6H   Continuous Infusions: . sodium chloride 10 mL/hr at 04/21/17 0752  . methocarbamol (ROBAXIN)  IV     PRN Meds: acetaminophen, acetaminophen, ALPRAZolam, bisacodyl, HYDROcodone-acetaminophen, HYDROcodone-acetaminophen, methocarbamol **OR** methocarbamol (ROBAXIN)  IV, metoCLOPramide **OR** metoCLOPramide (REGLAN) injection, morphine injection, ondansetron **OR** ondansetron (ZOFRAN) IV, polyethylene glycol   Vital Signs    Vitals:   04/22/17 0619 04/22/17 0744 04/22/17 0944 04/22/17 1005  BP: (!) 156/67 (!) 142/60 (!) 156/67 (!) 156/67  Pulse: 65 85 67   Resp:  16    Temp: 97.6 F (36.4 C) 97.7 F (36.5 C)    TempSrc: Oral Oral    SpO2: 94%     Weight:      Height:        Intake/Output Summary (Last 24 hours) at 04/22/2017 1116 Last data filed at 04/22/2017 0830 Gross per 24 hour  Intake 678 ml  Output 900 ml  Net -222 ml   Filed Weights   04/20/17 0935 04/20/17 0944  Weight: 261 lb (118.4 kg) 261 lb (118.4 kg)    Telemetry    Atrial fib with tachy and brady rates - Personally Reviewed  ECG    NA - Personally Reviewed  Physical Exam   GEN: No acute distress.   Neck: No  JVD Cardiac: Irregular RR, no murmurs, rubs, or gallops.  Respiratory:  Clear  to auscultation bilaterally. GI: Soft, nontender, non-distended  MS: No  edema; No deformity. Neuro:  Nonfocal  Psych: Normal affect   Labs    Chemistry Recent Labs  Lab 04/20/17 0948 04/21/17 0644 04/22/17 0525  NA 137 135 137  K 4.2 4.0 3.9  CL 103 99* 101  CO2 19* 24 26  GLUCOSE 164* 135* 112*  BUN 16 18 12   CREATININE 0.56 0.62 0.47  CALCIUM 9.2 8.9 8.8*  GFRNONAA >60 >60 >60  GFRAA >60 >60 >60  ANIONGAP 15 12 10      Hematology Recent Labs  Lab 04/20/17 0948  WBC 8.5  RBC 4.58  HGB 13.5  HCT 41.6  MCV 90.8  MCH 29.5  MCHC 32.5  RDW 14.6  PLT 311    Cardiac EnzymesNo results for input(s): TROPONINI in the last 168 hours. No results for input(s): TROPIPOC in the last 168 hours.   BNPNo results for input(s): BNP, PROBNP in the last 168 hours.   DDimer No results for input(s): DDIMER in the last 168 hours.   Radiology    No results found.  Cardiac Studies   ECHO:  04/21/17  Study Conclusions  - Left ventricle: The cavity size was normal. Wall thickness  was   increased in a pattern of mild LVH. Systolic function was normal.   The estimated ejection fraction was in the range of 55% to 60%.   Indeterminant diastolic function (atrial fibrillation). Wall   motion was normal; there were no regional wall motion   abnormalities. - Aortic valve: There was trivial regurgitation. - Mitral valve: Mildly calcified annulus. Mildly calcified leaflets   . There was no significant regurgitation. - Left atrium: The atrium was mildly dilated. - Right ventricle: The cavity size was normal. Systolic function   was normal. - Right atrium: The atrium was mildly dilated. - Tricuspid valve: Peak RV-RA gradient (S): 23 mm Hg. - Pulmonary arteries: PA peak pressure: 26 mm Hg (S). - Inferior vena cava: The vessel was normal in size. The   respirophasic diameter changes were in the normal range (= 50%),   consistent with normal central venous  pressure.  Impressions:  - The patient was in atrial fibrillation. Normal LV size with mild   LV hypertrophy. EF 55-60%. Normal RV size and systolic function.   No significant valvular abnormalities.  Patient Profile     67 y.o. female with a hx of hypertension, diabetes type 2, hyperlipidemia who is being seen for the evaluation of atrial fibrillation and preoperative evaluation at the request of Dr. Veverly Fells.   Assessment & Plan    ATRIAL FIB:  Rate was slow this morning as mentioned.  She needs to be on Eliquis when OK from a surgical standpoint.   Metoprolol dose decreased to 100 mg bid.   We will follow.   Echo results as above.   For questions or updates, please contact [place] Please consult [email or web address] for contact info under Cardiology/STEMI.   Signed, Minus Breeding, MD  04/22/2017, 11:16 AM

## 2017-04-22 NOTE — Progress Notes (Addendum)
1400 I got a call from Telemetry monitoring, pt's HR sustaining slow at 33. Pt checked,  A&O x4, denies chest pain, no SOB. HR is irregular 42 upon checking. 33-46 at the monitor. Message sent to Dr Percival Spanish. Placed a page to [employer].  1415 RRT notified, will come to see pt. Helle RN RRT came in. [place] called back and notified of low HR. Meds adjusted.

## 2017-04-23 LAB — BASIC METABOLIC PANEL
Anion gap: 11 (ref 5–15)
BUN: 12 mg/dL (ref 6–20)
CO2: 26 mmol/L (ref 22–32)
Calcium: 8.9 mg/dL (ref 8.9–10.3)
Chloride: 98 mmol/L — ABNORMAL LOW (ref 101–111)
Creatinine, Ser: 0.54 mg/dL (ref 0.44–1.00)
GFR calc Af Amer: >60 mL/min (ref >60)
GLUCOSE: 166 mg/dL — AB (ref 65–99)
POTASSIUM: 4 mmol/L (ref 3.5–5.1)
Sodium: 135 mmol/L (ref 135–145)

## 2017-04-23 LAB — GLUCOSE, CAPILLARY
GLUCOSE-CAPILLARY: 121 mg/dL — AB (ref 65–99)
Glucose-Capillary: 146 mg/dL — ABNORMAL HIGH (ref 65–99)
Glucose-Capillary: 144 mg/dL — ABNORMAL HIGH (ref 65–99)
Glucose-Capillary: 128 mg/dL — ABNORMAL HIGH (ref 65–99)

## 2017-04-23 MED ORDER — AMLODIPINE BESYLATE 5 MG PO TABS: 7.5000 mg | ORAL_TABLET | Freq: Every day | ORAL | Status: DC

## 2017-04-23 NOTE — Progress Notes (Addendum)
  Orthopedics Progress Note  Subjective: Patient doing well, no complaints.  The rapid response team was called again for HR in the 30s. Patient assymptomatic.  Objective:  Vitals:   04/23/17 0359 04/23/17 0437  BP: (!) 173/69 (!) 151/76  Pulse: 93   Resp:    Temp: 98.2 F (36.8 C)   SpO2: 98%     General: Awake and alert  Musculoskeletal: left ankle elevated in short leg splint, wiggles toes  Neurovascularly intact  Lab Results  Component Value Date   WBC 8.5 04/20/2017   HGB 13.5 04/20/2017   HCT 41.6 04/20/2017   MCV 90.8 04/20/2017   PLT 311 04/20/2017       Component Value Date/Time   NA 137 04/22/2017 0525   NA 142 01/20/2017 1540   K 3.9 04/22/2017 0525   CL 101 04/22/2017 0525   CO2 26 04/22/2017 0525   GLUCOSE 112 (H) 04/22/2017 0525   BUN 12 04/22/2017 0525   BUN 13 01/20/2017 1540   CREATININE 0.47 04/22/2017 0525   CALCIUM 8.8 (L) 04/22/2017 0525   GFRNONAA >60 04/22/2017 0525   GFRAA >60 04/22/2017 0525    No results found for: INR, PROTIME  Assessment/Plan: POD #3 s/p Procedure(s): OPEN REDUCTION INTERNAL FIXATION (ORIF) LEFT ANKLE FRACTURE Appreciate cardiology following. Will need recommendations regarding medications and whether the patient needs to remain in hospital for telemetry. Would she be a good inpatient rehab candidate?  Doran Heater. Veverly Fells, MD 04/23/2017 7:40 AM
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
## 2017-04-23 NOTE — Plan of Care (Signed)
  Nutrition: Adequate nutrition will be maintained 04/23/2017 1042 - Progressing by Williams Che, RN   Elimination: Will not experience complications related to bowel motility 04/23/2017 1042 - Progressing by Williams Che, RN   Pain Managment: General experience of comfort will improve 04/23/2017 1042 - Progressing by Williams Che, RN   Safety: Ability to remain free from injury will improve 04/23/2017 1042 - Progressing by Williams Che, RN

## 2017-04-23 NOTE — Progress Notes (Signed)
Progress Note  Patient Name: Ashley [name] Date of Encounter: 04/23/2017  Primary Cardiologist:   No primary care provider on file.   Subjective   No palpitations, presyncope or syncope.    Inpatient Medications    Scheduled Meds: . amLODipine  5 mg Oral Daily  . aspirin  325 mg Oral Q1400  . atorvastatin  20 mg Oral q1800  . cloNIDine  0.2 mg Oral BID  . diltiazem  120 mg Oral Daily  . docusate sodium  100 mg Oral BID  . insulin aspart  0-15 Units Subcutaneous TID WC  . insulin aspart  0-5 Units Subcutaneous QHS  . insulin aspart  4 Units Subcutaneous TID WC  . metFORMIN  1,000 mg Oral BID WC  . metoprolol tartrate  50 mg Oral BID  . quinapril  40 mg Oral Daily  . traMADol  50 mg Oral Q6H   Continuous Infusions: . sodium chloride 10 mL/hr at 04/21/17 0752  . methocarbamol (ROBAXIN)  IV     PRN Meds: acetaminophen, acetaminophen, ALPRAZolam, bisacodyl, HYDROcodone-acetaminophen, HYDROcodone-acetaminophen, methocarbamol **OR** methocarbamol (ROBAXIN)  IV, metoCLOPramide **OR** metoCLOPramide (REGLAN) injection, morphine injection, ondansetron **OR** ondansetron (ZOFRAN) IV, polyethylene glycol   Vital Signs    Vitals:   04/22/17 2215 04/23/17 0359 04/23/17 0437 04/23/17 1014  BP:  (!) 173/69 (!) 151/76 (!) 148/70  Pulse: (!) 59 93  72  Resp:    16  Temp:  98.2 F (36.8 C)  98 F (36.7 C)  TempSrc:  Oral  Oral  SpO2:  98%  97%  Weight:      Height:        Intake/Output Summary (Last 24 hours) at 04/23/2017 1110 Last data filed at 04/23/2017 0900 Gross per 24 hour  Intake 240 ml  Output 400 ml  Net -160 ml   Filed Weights   04/20/17 0935 04/20/17 0944  Weight: 261 lb (118.4 kg) 261 lb (118.4 kg)    Telemetry    Atrial fib with tachy and brady rates - Personally Reviewed  ECG    NA - Personally Reviewed  Physical Exam   GEN: No acute distress.   Neck: No  JVD Cardiac: Irregular RR, no murmurs, rubs, or gallops.  Respiratory: Clear  to  auscultation bilaterally. GI: Soft, nontender, non-distended  MS: No  edema; No deformity. Neuro:  Nonfocal  Psych: Normal affect   Labs    Chemistry Recent Labs  Lab 04/21/17 0644 04/22/17 0525 04/23/17 0916  NA 135 137 135  K 4.0 3.9 4.0  CL 99* 101 98*  CO2 24 26 26   GLUCOSE 135* 112* 166*  BUN 18 12 12   CREATININE 0.62 0.47 0.54  CALCIUM 8.9 8.8* 8.9  GFRNONAA >60 >60 >60  GFRAA >60 >60 >60  ANIONGAP 12 10 11      Hematology Recent Labs  Lab 04/20/17 0948  WBC 8.5  RBC 4.58  HGB 13.5  HCT 41.6  MCV 90.8  MCH 29.5  MCHC 32.5  RDW 14.6  PLT 311    Cardiac EnzymesNo results for input(s): TROPONINI in the last 168 hours. No results for input(s): TROPIPOC in the last 168 hours.   BNPNo results for input(s): BNP, PROBNP in the last 168 hours.   DDimer No results for input(s): DDIMER in the last 168 hours.   Radiology    No results found.  Cardiac Studies   ECHO:  04/21/17  Study Conclusions  - Left ventricle: The cavity size was normal. Wall thickness was  increased in a pattern of mild LVH. Systolic function was normal.   The estimated ejection fraction was in the range of 55% to 60%.   Indeterminant diastolic function (atrial fibrillation). Wall   motion was normal; there were no regional wall motion   abnormalities. - Aortic valve: There was trivial regurgitation. - Mitral valve: Mildly calcified annulus. Mildly calcified leaflets   . There was no significant regurgitation. - Left atrium: The atrium was mildly dilated. - Right ventricle: The cavity size was normal. Systolic function   was normal. - Right atrium: The atrium was mildly dilated. - Tricuspid valve: Peak RV-RA gradient (S): 23 mm Hg. - Pulmonary arteries: PA peak pressure: 26 mm Hg (S). - Inferior vena cava: The vessel was normal in size. The   respirophasic diameter changes were in the normal range (= 50%),   consistent with normal central venous pressure.  Impressions:  -  The patient was in atrial fibrillation. Normal LV size with mild   LV hypertrophy. EF 55-60%. Normal RV size and systolic function.   No significant valvular abnormalities.  Patient Profile     67 y.o. female with a hx of hypertension, diabetes type 2, hyperlipidemia who is being seen for the evaluation of atrial fibrillation and preoperative evaluation at the request of Dr. Veverly [name].   Assessment & Plan    ATRIAL FIB:  Rate is better with lower dose of beat blocker.  However, BP up.  See below.   HTN:  Increase Norvasc to 7.5 mg daily.    For questions or updates, please contact [place] Please consult [email or web address] for contact info under Cardiology/STEMI.   Signed, Ashley Breeding, MD  04/23/2017, 11:10 AM

## 2017-04-24 ENCOUNTER — Other Ambulatory Visit: Payer: Self-pay

## 2017-04-24 LAB — GLUCOSE, CAPILLARY
GLUCOSE-CAPILLARY: 135 mg/dL — AB (ref 65–99)
GLUCOSE-CAPILLARY: 131 mg/dL — AB (ref 65–99)
Glucose-Capillary: 136 mg/dL — ABNORMAL HIGH (ref 65–99)
Glucose-Capillary: 111 mg/dL — ABNORMAL HIGH (ref 65–99)

## 2017-04-24 MED ORDER — AMLODIPINE BESYLATE 10 MG PO TABS
10.0000 mg | ORAL_TABLET | Freq: Every day | ORAL | Status: DC
  Administered 2017-04-24 – 2017-04-25 (×2): 10 mg via ORAL
  Filled 2017-04-24 (×2): qty 1

## 2017-04-24 NOTE — Discharge Summary (Addendum)
Orthopedic Discharge Summary        Physician Discharge Summary  Patient ID: Ashley [name] MRN: [id number] DOB/AGE: 15-Oct-1950 67 y.o.  Admit date: 04/20/2017 Discharge date: 04/25/2017  Procedures:  Procedure(s) (LRB): OPEN REDUCTION INTERNAL FIXATION (ORIF) LEFT ANKLE FRACTURE (Left)  Attending Physician:  Dr. Esmond Plants  Admission Diagnoses:   Left fibula fracture  Discharge Diagnoses:  Left fibula fracture   Past Medical History:  Diagnosis Date  . Depression   . Diabetes mellitus without complication ([place])   . Hyperlipidemia   . Hypertension     PCP: Chevis Pretty, FNP   Discharged Condition: good  Hospital Course:  Patient underwent the above stated procedure on 04/20/2017. Patient tolerated the procedure well and brought to the recovery room in good condition and subsequently to the floor. Patient has some a-fib prior to admission and cardiology prior to surgery was started. Medications were adjusted and pt followed by cardiology throughout entire stay. Pt stable for d/c to SNF   Disposition: Discharge disposition: 03-Skilled Nursing Facility      with follow up in 2 weeks   Follow-up Information    Netta Cedars, MD. Call in 2 week(s).   Specialty:  Orthopedic Surgery Why:  [phone] Contact information: [address] [place] [address] [phone]        Arnoldo Lenis, MD Follow up.   Specialty:  Cardiology Why:  You have an appointment with Dr. Harl Bowie for cardiology on Thursday March 21st at 3:20. You need to be followed for the atrial fibrillation. Please arrive 15 minutes early to check in.  Contact information: [place] [address] [phone]           Discharge Instructions    Call MD / Call 911   Complete by:  As directed    If you experience chest pain or shortness of breath, CALL 911 and be transported to the hospital emergency room.  If you develope a fever above  101 F, pus (white drainage) or increased drainage or redness at the wound, or calf pain, call your surgeon's office.   Constipation Prevention   Complete by:  As directed    Drink plenty of fluids.  Prune juice may be helpful.  You may use a stool softener, such as Colace (over the counter) 100 mg twice a day.  Use MiraLax (over the counter) for constipation as needed.   Diet - low sodium heart healthy   Complete by:  As directed    Increase activity slowly as tolerated   Complete by:  As directed       Allergies as of 04/24/2017      Reactions   Ciprofloxacin Hcl Hives      Medication List    TAKE these medications   acetaminophen 500 MG tablet Commonly known as:  TYLENOL Take 1,000 mg by mouth every 6 (six) hours as needed (for pain/headaches.).   ALPRAZolam 0.25 MG tablet Commonly known as:  XANAX Take 1 tablet (0.25 mg total) by mouth daily as needed for anxiety.   amLODipine 5 MG tablet Commonly known as:  NORVASC TAKE 1 TABLET (5 MG TOTAL) BY MOUTH DAILY.   amoxicillin-clavulanate 875-125 MG tablet Commonly known as:  AUGMENTIN Take 1 tablet by mouth 2 (two) times daily.   aspirin 325 MG tablet Take 325 mg by mouth daily at 2 PM.   CINNAMON PO Take 1 capsule by mouth daily.   cloNIDine 0.2 MG tablet  Commonly known as:  CATAPRES Take 1 tablet (0.2 mg total) by mouth 2 (two) times daily.   diltiazem 120 MG 24 hr capsule Commonly known as:  CARDIZEM CD 90TAKE 1 CAPSULE BY MOUTH EVERY DAY What changed:    how much to take  how to take this  when to take this  additional instructions   furosemide 40 MG tablet Commonly known as:  LASIX 1 po qam and 1/2 po in afternoon What changed:    how much to take  how to take this  when to take this  reasons to take this  additional instructions   GINGER ROOT PO Take 1 capsule by mouth daily.   glucose blood test strip Commonly known as:  BAYER CONTOUR NEXT TEST Checks blood sugars 1x per day and prn.   E11.9   HYDROcodone-acetaminophen 5-325 MG tablet Commonly known as:  NORCO Take 1 tablet by mouth every 6 (six) hours as needed for moderate pain.   HYDROcodone-homatropine 5-1.5 MG/5ML syrup Commonly known as:  HYCODAN Take 5 mLs by mouth every 6 (six) hours as needed for cough.   metFORMIN 1000 MG tablet Commonly known as:  GLUCOPHAGE Take 1 tablet (1,000 mg total) by mouth 2 (two) times daily with a meal.   metoprolol tartrate 100 MG tablet Commonly known as:  LOPRESSOR TAKE TWO TABLETS BY MOUTH IN THE MORNING AND ONE AT BEDTIME What changed:    how much to take  how to take this  when to take this  additional instructions   predniSONE 20 MG tablet Commonly known as:  DELTASONE 2 po at sametime daily for 5 days   promethazine 25 MG/ML injection Commonly known as:  PHENERGAN Inject 0.25-0.5 mLs (6.25-12.5 mg total) into the vein every 15 (fifteen) minutes as needed for nausea.   quinapril 40 MG tablet Commonly known as:  ACCUPRIL Take 1 tablet (40 mg total) by mouth daily.   simvastatin 40 MG tablet Commonly known as:  ZOCOR TAKE 1 TABLET BY MOUTH EVERY DAY IN THE EVENING What changed:    how much to take  how to take this  when to take this  additional instructions   Turmeric Curcumin 500 MG Caps Take 500 mg by mouth daily.   zolpidem 5 MG tablet Commonly known as:  AMBIEN Take 1 tablet (5 mg total) by mouth at bedtime as needed for sleep.     ASK your doctor about these medications   chlorhexidine 4 % external liquid Commonly known as:  HIBICLENS Apply 60 mLs (4 application total) topically once for 1 dose. Ask about: Should I take this medication?            Durable Medical Equipment  (From admission, onward)        Start     Ordered   04/20/17 1708  DME 3 n 1  Once     04/20/17 1707   04/20/17 1708  DME Bedside commode  Once    Question:  Patient needs a bedside commode to treat with the following condition  Answer:  Status post  ORIF of fracture of ankle   04/20/17 1707     Change Norvasc from 5 mg to 10 mg daily per cardiology   Signed: Ventura Bruns 04/24/2017, 10:09 AM  [place] is now [employer] [address] [place], [place], [place] [address] Phone: [place]

## 2017-04-24 NOTE — Care Management Important Message (Signed)
Important Message  Patient Details  Name: Ashley [name] MRN: [id number] Date of Birth: Apr 15, 1950   Medicare Important Message Given:  Yes    Fleta Borgeson Montine Circle 04/24/2017, 1:19 PM

## 2017-04-24 NOTE — Social Work (Addendum)
CSW met with patient at bedside to discuss SNF recommendations. Patient/daughter indicated that mom injured herself while working and this will be a WC case.  CSW obtained name of contact at school [facility] [phone]. CSW will need to obtain information for [facility] adjuster and RNCM to assist with placement. Pt will more than likely not be able to discharge to SNF today.  CSW will advise RNCM and follow up for [facility] adjuster contact.  12:09pm Kayla provided CSW with WC information: Claims [place], [place], [place], phone#[phone].  CSW left a message and will continue to follow up.  CSW will continue to follow up.  Elissa Hefty, LCSW Clinical Social Worker [phone]

## 2017-04-24 NOTE — Progress Notes (Signed)
Progress Note  Patient Name: Ashley [name] Date of Encounter: 04/24/2017  Primary Cardiologist: Dr Radford Pax  Subjective   Pt denies CP or dyspnea  Inpatient Medications    Scheduled Meds: . amLODipine  7.5 mg Oral Daily  . aspirin  325 mg Oral Q1400  . atorvastatin  20 mg Oral q1800  . cloNIDine  0.2 mg Oral BID  . diltiazem  120 mg Oral Daily  . docusate sodium  100 mg Oral BID  . insulin aspart  0-15 Units Subcutaneous TID WC  . insulin aspart  0-5 Units Subcutaneous QHS  . insulin aspart  4 Units Subcutaneous TID WC  . metFORMIN  1,000 mg Oral BID WC  . metoprolol tartrate  50 mg Oral BID  . quinapril  40 mg Oral Daily  . traMADol  50 mg Oral Q6H   Continuous Infusions: . sodium chloride 10 mL/hr at 04/21/17 0752  . methocarbamol (ROBAXIN)  IV     PRN Meds: acetaminophen, acetaminophen, ALPRAZolam, bisacodyl, HYDROcodone-acetaminophen, HYDROcodone-acetaminophen, methocarbamol **OR** methocarbamol (ROBAXIN)  IV, metoCLOPramide **OR** metoCLOPramide (REGLAN) injection, morphine injection, ondansetron **OR** ondansetron (ZOFRAN) IV, polyethylene glycol   Vital Signs    Vitals:   04/24/17 0634 04/24/17 0826 04/24/17 0829 04/24/17 0830  BP: (!) 155/74 (!) 150/80 (!) 150/80 (!) 150/80  Pulse: 75 77 77   Resp: 18 20    Temp: 97.7 F (36.5 C)     TempSrc: Oral     SpO2: 96% 97%    Weight:      Height:        Intake/Output Summary (Last 24 hours) at 04/24/2017 0905 Last data filed at 04/24/2017 0000 Gross per 24 hour  Intake 1000 ml  Output 500 ml  Net 500 ml   Filed Weights   04/20/17 0935 04/20/17 0944  Weight: 261 lb (118.4 kg) 261 lb (118.4 kg)    Telemetry    Atrial fibrillation; rate controlled. Personally Reviewed   Physical Exam   GEN: No acute distress.   Neck: No JVD Cardiac: irregular Respiratory: Clear to auscultation bilaterally. GI: Soft, nontender, non-distended  MS: No edema; left leg dressed Neuro:  Nonfocal  Psych: Normal affect    Labs    Chemistry Recent Labs  Lab 04/21/17 0644 04/22/17 0525 04/23/17 0916  NA 135 137 135  K 4.0 3.9 4.0  CL 99* 101 98*  CO2 24 26 26   GLUCOSE 135* 112* 166*  BUN 18 12 12   CREATININE 0.62 0.47 0.54  CALCIUM 8.9 8.8* 8.9  GFRNONAA >60 >60 >60  GFRAA >60 >60 >60  ANIONGAP 12 10 11      Hematology Recent Labs  Lab 04/20/17 0948  WBC 8.5  RBC 4.58  HGB 13.5  HCT 41.6  MCV 90.8  MCH 29.5  MCHC 32.5  RDW 14.6  PLT 311    Patient Profile     67 y.o. female admitted with left fibula fracture and noted to be in atrial fibrillation.  Echocardiogram showed normal LV function and mild biatrial enlargement.  Patient is now status post repair of fracture.  Assessment & Plan    1 atrial fibrillation-patient remains in atrial fibrillation.  Her rate is low normal to mildly reduced.  She is on 2 calcium blockers.  Discontinue Cardizem and follow heart rate.  If it increases we can advance metoprolol. [id number]. Would discontinue aspirin and begin apixaban 5 mg twice daily when okay with surgery.  She will need follow-up with Dr. Radford Pax after discharge.  2  hypertension-blood pressure is elevated.  Patient is on 2 calcium blockers.  Discontinue Cardizem.  Increase amlodipine to 10 mg daily.  Follow blood pressure and adjust regimen as needed.  3 Status post repair of left fibula fracture- per orthopedics.  For questions or updates, please contact [place] Please consult [email or web address] for contact info under Cardiology/STEMI.      Signed, Kirk Ruths, MD  04/24/2017, 9:05 AM

## 2017-04-24 NOTE — Progress Notes (Signed)
Physical Therapy Treatment Patient Details Name: Ashley [name] MRN: [id number] DOB: 08-11-1950 Today's Date: 04/24/2017    History of Present Illness 67 y.o. female s/p L ankle ORIF after fall at home. PMH include: HTN, DM, hyperlipidemia, Obesity, A-Fib    PT Comments    Patient progressing well with therapy today, requiring less assistance for bed mobility and transfers and able to ambulate short distance in room with min guarding. Patient did have 1 LOB when attempting to stand requiring her to sit back down onto bed and is still a falls risk at this time. SNF appropriate to maximize safety before return to home.    Follow Up Recommendations  SNF;Supervision/Assistance - 24 hour     Equipment Recommendations  Rolling walker with 5" wheels;3in1 (PT)    Recommendations for Other Services       Precautions / Restrictions Precautions Precautions: Fall Restrictions Weight Bearing Restrictions: Yes LLE Weight Bearing: Non weight bearing    Mobility  Bed Mobility Overal bed mobility: Needs Assistance Bed Mobility: Supine to Sit     Supine to sit: Supervision        Transfers Overall transfer level: Needs assistance Equipment used: Rolling walker (2 wheeled);1 person hand held assist Transfers: Sit to/from [facility] Sit to Stand: Min guard Stand pivot transfers: Min guard          Ambulation/Gait Ambulation/Gait assistance: Min assist;+2 physical assistance   Assistive device: Rolling walker (2 wheeled) Gait Pattern/deviations: Step-to pattern;Antalgic Gait velocity: decreased   General Gait Details: Hop to gait, fatigues early, min A to most to stabilize.    Stairs            Wheelchair Mobility    Modified Rankin (Stroke Patients Only)       Balance Overall balance assessment: Needs assistance Sitting-balance support: Feet unsupported;No upper extremity supported Sitting balance-Leahy Scale: Good     Standing balance  support: During functional activity;Bilateral upper extremity supported Standing balance-Leahy Scale: Poor Standing balance comment: Reliant on RW for balance due to NWB status                            Cognition Arousal/Alertness: Awake/alert Behavior During Therapy: WFL for tasks assessed/performed Overall Cognitive Status: Within Functional Limits for tasks assessed                                        Exercises      General Comments        Pertinent Vitals/Pain Pain Assessment: 0-10 Pain Score: 2  Pain Location: L ankle Pain Descriptors / Indicators: Aching;Contraction;Discomfort;Grimacing;Operative site guarding Pain Intervention(s): Limited activity within patient's tolerance;Monitored during session;Premedicated before session;Repositioned    Home Living                      Prior Function            PT Goals (current goals can now be found in the care plan section) Acute Rehab PT Goals Patient Stated Goal: return home but open for rehab  PT Goal Formulation: With patient Time For Goal Achievement: 04/28/17 Potential to Achieve Goals: Good Progress towards PT goals: Progressing toward goals    Frequency    Min 3X/week      PT Plan Current plan remains appropriate    Co-evaluation  AM-PAC PT "6 Clicks" Daily Activity  Outcome Measure  Difficulty turning over in bed (including adjusting bedclothes, sheets and blankets)?: A Little Difficulty moving from lying on back to sitting on the side of the bed? : A Little Difficulty sitting down on and standing up from a chair with arms (e.g., wheelchair, bedside commode, etc,.)?: A Little Help needed moving to and from a bed to chair (including a wheelchair)?: A Little Help needed walking in hospital room?: A Lot Help needed climbing 3-5 steps with a railing? : Total 6 Click Score: 15    End of Session Equipment Utilized During Treatment: Gait  belt Activity Tolerance: Patient limited by fatigue Patient left: in chair;with call bell/phone within reach Nurse Communication: Mobility status PT Visit Diagnosis: Unsteadiness on feet (R26.81);Other abnormalities of gait and mobility (R26.89);Repeated falls (R29.6)     Time: 4128-2081 PT Time Calculation (min) (ACUTE ONLY): 18 min  Charges:  $Gait Training: 8-22 mins                    G Codes:       Reinaldo Berber, PT, DPT Acute Rehab Services Pager: [phone]     Reinaldo Berber 04/24/2017, 8:23 AM

## 2017-04-24 NOTE — Progress Notes (Addendum)
   Subjective: 4 Days Post-Op Procedure(s) (LRB): OPEN REDUCTION INTERNAL FIXATION (ORIF) LEFT ANKLE FRACTURE (Left)  Pt doing fairly well today Pain is mild Has been progressing with therapy Have recommendations from cardiology for her medications Patient reports pain as mild.  Objective:   VITALS:   Vitals:   04/24/17 0829 04/24/17 0830  BP: (!) 150/80 (!) 150/80  Pulse: 77   Resp:    Temp:    SpO2:      Left ankle splint intact nv intact distally No drainage or apparent edema  LABS No results for input(s): HGB, HCT, WBC, PLT in the last 72 hours.  Recent Labs    04/22/17 0525 04/23/17 0916  NA 137 135  K 3.9 4.0  BUN 12 12  CREATININE 0.47 0.54  GLUCOSE 112* 166*     Assessment/Plan: 4 Days Post-Op Procedure(s) (LRB): OPEN REDUCTION INTERNAL FIXATION (ORIF) LEFT ANKLE FRACTURE (Left) Plan to d/c to SNF today if bed available Pt in agreement F/u in 2 weeks for recheck Pain management as needed Non weight bearing left lower extremity    Brad Luna Glasgow, MPAS [place] is now [employer] [facility] [address] [place], [place],  [address] Phone: [phone] [email or web address] Facebook  Instagram  LinkedIn  Twitter    Patient remains in hospital due to insurance issues.  Stable from Ortho standpoint to release to SNF once bed available. Continue mobilization and DVT prophylaxis/ decubitus precautions Social Work and Care Management working on the case   Augustin Schooling MD

## 2017-04-25 LAB — GLUCOSE, CAPILLARY
GLUCOSE-CAPILLARY: 126 mg/dL — AB (ref 65–99)
Glucose-Capillary: 140 mg/dL — ABNORMAL HIGH (ref 65–99)

## 2017-04-25 MED ORDER — METOPROLOL TARTRATE 50 MG PO TABS: 50.0000 mg | ORAL_TABLET | Freq: Two times a day (BID) | ORAL | 0 refills | Status: DC

## 2017-04-25 MED ORDER — APIXABAN 5 MG PO TABS
5.0000 mg | ORAL_TABLET | Freq: Two times a day (BID) | ORAL | Status: DC
  Administered 2017-04-25: 5 mg via ORAL
  Filled 2017-04-25: qty 1

## 2017-04-25 MED ORDER — AMLODIPINE BESYLATE 10 MG PO TABS: 10.0000 mg | ORAL_TABLET | Freq: Every day | ORAL | 0 refills | Status: DC

## 2017-04-25 MED ORDER — APIXABAN 5 MG PO TABS: 5.0000 mg | ORAL_TABLET | Freq: Two times a day (BID) | ORAL | 0 refills | Status: DC

## 2017-04-25 NOTE — Progress Notes (Signed)
Orthopedics Progress Note  Subjective: No complaints, ready for rehab  Objective:  Vitals:   04/25/17 0450 04/25/17 0703  BP: (!) 159/70 (!) 150/77  Pulse: 65 60  Resp: 17 17  Temp: 98.4 F (36.9 C) 98.4 F (36.9 C)  SpO2: 96% 97%    General: Awake and alert  Musculoskeletal: left foot elevated and splinted, wiggles toes well Neurovascularly intact  Lab Results  Component Value Date   WBC 8.5 04/20/2017   HGB 13.5 04/20/2017   HCT 41.6 04/20/2017   MCV 90.8 04/20/2017   PLT 311 04/20/2017       Component Value Date/Time   NA 135 04/23/2017 0916   NA 142 01/20/2017 1540   K 4.0 04/23/2017 0916   CL 98 (L) 04/23/2017 0916   CO2 26 04/23/2017 0916   GLUCOSE 166 (H) 04/23/2017 0916   BUN 12 04/23/2017 0916   BUN 13 01/20/2017 1540   CREATININE 0.54 04/23/2017 0916   CALCIUM 8.9 04/23/2017 0916   GFRNONAA >60 04/23/2017 0916   GFRAA >60 04/23/2017 0916    No results found for: INR, PROTIME  Assessment/Plan: POD #4 s/p Procedure(s): OPEN REDUCTION INTERNAL FIXATION (ORIF) LEFT ANKLE FRACTURE Discharge to SNF today once insurance worked out Remain NWB L LE Follow up in the office in two weeks  [employer]. Veverly Fells, MD 04/25/2017 7:28 AM

## 2017-04-25 NOTE — Social Work (Signed)
Clinical Social Worker facilitated patient discharge including contacting patient family and facility to confirm patient discharge plans.  Clinical information faxed to facility and family agreeable with plan.    CSW arranged ambulance transport via [facility] to [facility].    RN to call [phone] to give report prior to discharge.  Clinical Social Worker will sign off for now as social work intervention is no longer needed. Please consult us again if new need arises.  Donn Wilmot, LCSW Clinical Social Worker [phone]    

## 2017-04-25 NOTE — Progress Notes (Signed)
Progress Note  Patient Name: Ashley [name] Date of Encounter: 04/25/2017  Primary Cardiologist: Dr Radford Pax  Subjective   No dyspnea or CP  Inpatient Medications    Scheduled Meds: . amLODipine  10 mg Oral Daily  . atorvastatin  20 mg Oral q1800  . cloNIDine  0.2 mg Oral BID  . docusate sodium  100 mg Oral BID  . insulin aspart  0-15 Units Subcutaneous TID WC  . insulin aspart  0-5 Units Subcutaneous QHS  . insulin aspart  4 Units Subcutaneous TID WC  . metFORMIN  1,000 mg Oral BID WC  . metoprolol tartrate  50 mg Oral BID  . quinapril  40 mg Oral Daily  . traMADol  50 mg Oral Q6H   Continuous Infusions: . sodium chloride 10 mL/hr at 04/21/17 0752  . methocarbamol (ROBAXIN)  IV     PRN Meds: acetaminophen, acetaminophen, ALPRAZolam, bisacodyl, HYDROcodone-acetaminophen, HYDROcodone-acetaminophen, methocarbamol **OR** methocarbamol (ROBAXIN)  IV, metoCLOPramide **OR** metoCLOPramide (REGLAN) injection, morphine injection, ondansetron **OR** ondansetron (ZOFRAN) IV, polyethylene glycol   Vital Signs    Vitals:   04/24/17 1300 04/24/17 1939 04/25/17 0450 04/25/17 0703  BP: 130/68 130/63 (!) 159/70 (!) 150/77  Pulse: 73 66 65 60  Resp: 18 18 17 17   Temp: 98.2 F (36.8 C) 97.9 F (36.6 C) 98.4 F (36.9 C) 98.4 F (36.9 C)  TempSrc: Oral Axillary Oral Oral  SpO2: 95% 99% 96% 97%  Weight:      Height:        Intake/Output Summary (Last 24 hours) at 04/25/2017 0847 Last data filed at 04/25/2017 0800 Gross per 24 hour  Intake 1560 ml  Output 2300 ml  Net -740 ml   Filed Weights   04/20/17 0935 04/20/17 0944  Weight: 261 lb (118.4 kg) 261 lb (118.4 kg)    Telemetry    Atrial fibrillation; rate controlled. Personally Reviewed   Physical Exam   GEN: Obese NAD Neck: supple Cardiac: irregular, normal rate Respiratory: Clear to auscultation bilaterally; no wheeze GI: Soft, NT/ND MS: LLE in cast Neuro: grossly intact   Labs    Chemistry Recent Labs  Lab  04/21/17 0644 04/22/17 0525 04/23/17 0916  NA 135 137 135  K 4.0 3.9 4.0  CL 99* 101 98*  CO2 24 26 26   GLUCOSE 135* 112* 166*  BUN 18 12 12   CREATININE 0.62 0.47 0.54  CALCIUM 8.9 8.8* 8.9  GFRNONAA >60 >60 >60  GFRAA >60 >60 >60  ANIONGAP 12 10 11      Hematology Recent Labs  Lab 04/20/17 0948  WBC 8.5  RBC 4.58  HGB 13.5  HCT 41.6  MCV 90.8  MCH 29.5  MCHC 32.5  RDW 14.6  PLT 311    Patient Profile     67 y.o. female admitted with left fibula fracture and noted to be in atrial fibrillation.  Echocardiogram showed normal LV function and mild biatrial enlargement.  Patient is now status post repair of fracture.  Assessment & Plan    1 atrial fibrillation-patient remains in atrial fibrillation.  Her rate is controlled; continue metoprolol at present dose. [id number]. Would discontinue aspirin and begin apixaban 5 mg twice daily (Discussed with Dr Veverly Fells; can begin apixaban now).  FU with Dr Radford Pax following DC.  2  hypertension-blood pressure is improving.  Continue present meds and adjust regimen as needed.  3 Status post repair of left fibula fracture- per Dr Veverly Fells.  We will sign off; please call with questions.  For questions  or updates, please contact [facility] Please consult [email or web address] for contact info under Cardiology/STEMI.      Signed, Kirk Ruths, MD  04/25/2017, 8:47 AM

## 2017-04-25 NOTE — Social Work (Addendum)
CSW met with Case manager and discussed SNF placement. CSW provided the case manager with contact information for SNF's that has offered a SNF bed. Case manager will need to speak with admission staff at SNF to confirm payment and acceptance.  Case Manager f/u with [facility] to see if they can work with worker's comp case for SNF placement.  CSW will await to hear back from Case manager and SNF.  CSW will follow for disposition.  2:45pm: CSW f/u with SNF-[facility] and they have not confirmed placement yet. SNF confirmed receipt of dc summary.  CSW f/u for placement.  Elissa Hefty, LCSW Clinical Social Worker [phone]

## 2017-04-25 NOTE — Social Work (Signed)
CSW contacted WC adjuster to discuss case. She was not aware that patient had surgery and indicated that she has been unable to reach patient. WC adjuster indicated that they have not fully investigated the case and will f/u.  WC will need clinicals faxed to [phone]/ Claim#[id number], Attn: Laurance Flatten.   CSW explained to [facility] adjuster that patient was discharged yesterday and we needed to know about the SNF arrangements. WC will have to f/u and may need specific orders sent to her. CSW provided her contact information for our RNCM to assist with orders as CSW explained recommendations for SNF. WC adjuster will f/u on same.    CSW will continue to follow as patient discharged pending WC approval and process.  Elissa Hefty, LCSW Clinical Social Worker [phone]

## 2017-04-25 NOTE — Discharge Summary (Signed)
Orthopedic Discharge Summary        Physician Discharge Summary  Patient ID: Ashley [name] MRN: [id number] DOB/AGE: 1950/11/13 67 y.o.  Admit date: 04/20/2017 Discharge date: 04/25/2017   Procedures:  Procedure(s) (LRB): OPEN REDUCTION INTERNAL FIXATION (ORIF) LEFT ANKLE FRACTURE (Left)  Attending Physician:  Dr. Malon Kindle  Admission Diagnoses:  Left ankle displaced fibula fracture  Discharge Diagnoses: left ankle dispalced fibula fracture   Past Medical History:  Diagnosis Date  . Depression   . Diabetes mellitus without complication (HCC)   . Hyperlipidemia   . Hypertension     PCP: Bennie Pierini, FNP   Discharged Condition: good  Hospital Course:  Patient underwent the above stated procedure on 04/20/2017. Patient tolerated the procedure well and brought to the recovery room in good condition and subsequently to the floor. Patient had some post op bradycardia that was assymptomatic necessitating medication changes by cardiology but otherwise had an uncomplicated hospital course and was stable for discharge.   Disposition: Discharge disposition: 03-Skilled Nursing Facility      with follow up in 2 weeks   Follow-up Information    Beverely Low, MD. Call in 2 week(s).   Specialty:  Orthopedic Surgery Why:  [phone] Contact information: [address] [place] [address] [phone]        Antoine Poche, MD Follow up.   Specialty:  Cardiology Why:  You have an appointment with Dr. Wyline Mood for cardiology on Thursday March 21st at 3:20. You need to be followed for the atrial fibrillation. Please arrive 15 minutes early to check in.  Contact information: [address] [place] [address] [phone]           Discharge Instructions    Call MD / Call 911   Complete by:  As directed    If you experience chest pain or shortness of breath, CALL 911 and be transported to the hospital emergency room.  If  you develope a fever above 101 F, pus (white drainage) or increased drainage or redness at the wound, or calf pain, call your surgeon's office.   Call MD / Call 911   Complete by:  As directed    If you experience chest pain or shortness of breath, CALL 911 and be transported to the hospital emergency room.  If you develope a fever above 101 F, pus (white drainage) or increased drainage or redness at the wound, or calf pain, call your surgeon's office.   Call MD / Call 911   Complete by:  As directed    If you experience chest pain or shortness of breath, CALL 911 and be transported to the hospital emergency room.  If you develope a fever above 101 F, pus (white drainage) or increased drainage or redness at the wound, or calf pain, call your surgeon's office.   Constipation Prevention   Complete by:  As directed    Drink plenty of fluids.  Prune juice may be helpful.  You may use a stool softener, such as Colace (over the counter) 100 mg twice a day.  Use MiraLax (over the counter) for constipation as needed.   Constipation Prevention   Complete by:  As directed    Drink plenty of fluids.  Prune juice may be helpful.  You may use a stool softener, such as Colace (over the counter) 100 mg twice a day.  Use MiraLax (over the counter) for constipation as needed.   Constipation Prevention   Complete  by:  As directed    Drink plenty of fluids.  Prune juice may be helpful.  You may use a stool softener, such as Colace (over the counter) 100 mg twice a day.  Use MiraLax (over the counter) for constipation as needed.   Diet - low sodium heart healthy   Complete by:  As directed    Diet - low sodium heart healthy   Complete by:  As directed    Diet - low sodium heart healthy   Complete by:  As directed    Increase activity slowly as tolerated   Complete by:  As directed    Increase activity slowly as tolerated   Complete by:  As directed    Increase activity slowly as tolerated   Complete by:  As  directed       Allergies as of 04/25/2017      Reactions   Ciprofloxacin Hcl Hives      Medication List    STOP taking these medications   diltiazem 120 MG 24 hr capsule Commonly known as:  CARDIZEM CD     TAKE these medications   acetaminophen 500 MG tablet Commonly known as:  TYLENOL Take 1,000 mg by mouth every 6 (six) hours as needed (for pain/headaches.).   ALPRAZolam 0.25 MG tablet Commonly known as:  XANAX Take 1 tablet (0.25 mg total) by mouth daily as needed for anxiety.   amLODipine 10 MG tablet Commonly known as:  NORVASC Take 1 tablet (10 mg total) by mouth daily. What changed:    medication strength  how much to take   amoxicillin-clavulanate 875-125 MG tablet Commonly known as:  AUGMENTIN Take 1 tablet by mouth 2 (two) times daily.   apixaban 5 MG Tabs tablet Commonly known as:  ELIQUIS Take 1 tablet (5 mg total) by mouth 2 (two) times daily.   aspirin 325 MG tablet Take 325 mg by mouth daily at 2 PM.   CINNAMON PO Take 1 capsule by mouth daily.   cloNIDine 0.2 MG tablet Commonly known as:  CATAPRES Take 1 tablet (0.2 mg total) by mouth 2 (two) times daily.   furosemide 40 MG tablet Commonly known as:  LASIX 1 po qam and 1/2 po in afternoon What changed:    how much to take  how to take this  when to take this  reasons to take this  additional instructions   GINGER ROOT PO Take 1 capsule by mouth daily.   glucose blood test strip Commonly known as:  BAYER CONTOUR NEXT TEST Checks blood sugars 1x per day and prn.  E11.9   HYDROcodone-acetaminophen 5-325 MG tablet Commonly known as:  NORCO Take 1 tablet by mouth every 6 (six) hours as needed for moderate pain.   HYDROcodone-homatropine 5-1.5 MG/5ML syrup Commonly known as:  HYCODAN Take 5 mLs by mouth every 6 (six) hours as needed for cough.   metFORMIN 1000 MG tablet Commonly known as:  GLUCOPHAGE Take 1 tablet (1,000 mg total) by mouth 2 (two) times daily with a meal.    metoprolol tartrate 50 MG tablet Commonly known as:  LOPRESSOR Take 1 tablet (50 mg total) by mouth 2 (two) times daily. What changed:    medication strength  how much to take  how to take this  when to take this  additional instructions   predniSONE 20 MG tablet Commonly known as:  DELTASONE 2 po at sametime daily for 5 days   promethazine 25 MG/ML injection Commonly known as:  PHENERGAN Inject 0.25-0.5 mLs (6.25-12.5 mg total) into the vein every 15 (fifteen) minutes as needed for nausea.   quinapril 40 MG tablet Commonly known as:  ACCUPRIL Take 1 tablet (40 mg total) by mouth daily.   simvastatin 40 MG tablet Commonly known as:  ZOCOR TAKE 1 TABLET BY MOUTH EVERY DAY IN THE EVENING What changed:    how much to take  how to take this  when to take this  additional instructions   Turmeric Curcumin 500 MG Caps Take 500 mg by mouth daily.   zolpidem 5 MG tablet Commonly known as:  AMBIEN Take 1 tablet (5 mg total) by mouth at bedtime as needed for sleep.     ASK your doctor about these medications   chlorhexidine 4 % external liquid Commonly known as:  HIBICLENS Apply 60 mLs (4 application total) topically once for 1 dose. Ask about: Should I take this medication?            Durable Medical Equipment  (From admission, onward)        Start     Ordered   04/20/17 1708  DME 3 n 1  Once     04/20/17 1707   04/20/17 1708  DME Bedside commode  Once    Question:  Patient needs a bedside commode to treat with the following condition  Answer:  Status post ORIF of fracture of ankle   04/20/17 1707        Signed: Liann Spaeth,STEVEN R 04/25/2017, 10:13 AM  [facility] is now [employer] [address], [place], [place] [address] Phone: [phone] Facebook  [employer]

## 2017-04-25 NOTE — Progress Notes (Signed)
The patient is discharging to another healthcare facility via [facility] for continuation of treatment.  The patient's medical information are a part of the medical information shared with the receiving healthcare facility. Patient alert and oriented x4.   Ashley [name] 04/25/2017, 4:29 PM

## 2017-04-25 NOTE — Clinical Social Work Placement (Addendum)
   CLINICAL SOCIAL WORK PLACEMENT  NOTE  Date:  04/25/2017  Patient Details  Name: Ashley [name] MRN: [id number] Date of Birth: 22-Apr-1950  Clinical Social Work is seeking post-discharge placement for this patient at the [place] level of care (*CSW will initial, date and re-position this form in  chart as items are completed):  Yes   Patient/family provided with [place] Work Department's list of facilities offering this level of care within the geographic area requested by the patient (or if unable, by the patient's family).  Yes   Patient/family informed of their freedom to choose among providers that offer the needed level of care, that participate in Medicare, Medicaid or managed care program needed by the patient, have an available bed and are willing to accept the patient.  Yes   Patient/family informed of [facility]'s ownership interest in [facility] and [facility], as well as of the fact that they are under no obligation to receive care at these facilities.  PASRR submitted to EDS on       PASRR number received on 04/24/17     Existing PASRR number confirmed on       FL2 transmitted to all facilities in geographic area requested by pt/family on 04/24/17     FL2 transmitted to all facilities within larger geographic area on       Patient informed that his/her managed care company has contracts with or will negotiate with certain facilities, including the following:        Yes   Patient/family informed of bed offers received.  Patient chooses bed at [facility] [place]     Physician recommends and patient chooses bed at      Patient to be transferred to [place] [place] on 04/25/17.  Patient to be transferred to facility by [facility]     Patient family notified on 04/25/17 of transfer.  Name of family member notified:  pt responsible for self   & CSW spoke with daughter.  PHYSICIAN       Additional Comment:     _______________________________________________ Normajean Baxter, LCSW 04/25/2017, 3:15 PM

## 2017-04-27 ENCOUNTER — Ambulatory Visit: Payer: Medicare HMO | Admitting: Cardiology

## 2017-04-30 DIAGNOSIS — R21 Rash and other nonspecific skin eruption: Secondary | ICD-10-CM | POA: Diagnosis not present

## 2017-05-02 DIAGNOSIS — L988 Other specified disorders of the skin and subcutaneous tissue: Secondary | ICD-10-CM | POA: Diagnosis not present

## 2017-05-07 DIAGNOSIS — R21 Rash and other nonspecific skin eruption: Secondary | ICD-10-CM | POA: Diagnosis not present

## 2017-05-08 DIAGNOSIS — L988 Other specified disorders of the skin and subcutaneous tissue: Secondary | ICD-10-CM | POA: Diagnosis not present

## 2017-05-08 NOTE — Anesthesia Postprocedure Evaluation (Signed)
Anesthesia Post Note  Patient: Ashley [name]  Procedure(s) Performed: OPEN REDUCTION INTERNAL FIXATION (ORIF) LEFT ANKLE FRACTURE (Left Ankle)     Patient location during evaluation: PACU Anesthesia Type: General Level of consciousness: awake and alert Pain management: pain level controlled Vital Signs Assessment: post-procedure vital signs reviewed and stable Respiratory status: spontaneous breathing, nonlabored ventilation, respiratory function stable and patient connected to nasal cannula oxygen Cardiovascular status: blood pressure returned to baseline and stable Postop Assessment: no apparent nausea or vomiting Anesthetic complications: no    Last Vitals:  Vitals:   04/25/17 0703 04/25/17 1445  BP: (!) 150/77 122/72  Pulse: 60 (!) 53  Resp: 17 18  Temp: 36.9 C 36.6 C  SpO2: 97% 96%    Last Pain:  Vitals:   04/25/17 1445  TempSrc: Oral  PainSc:                  Lizza Huffaker,JAMES TERRILL

## 2017-05-09 DIAGNOSIS — Z4789 Encounter for other orthopedic aftercare: Secondary | ICD-10-CM | POA: Insufficient documentation

## 2017-05-19 ENCOUNTER — Ambulatory Visit (INDEPENDENT_AMBULATORY_CARE_PROVIDER_SITE_OTHER): Payer: Medicare HMO | Admitting: Nurse Practitioner

## 2017-05-19 ENCOUNTER — Encounter: Payer: Self-pay | Admitting: Nurse Practitioner

## 2017-05-19 VITALS — BP 148/76 | HR 67 | Temp 97.5°F

## 2017-05-19 DIAGNOSIS — E7800 Pure hypercholesterolemia, unspecified: Secondary | ICD-10-CM | POA: Diagnosis not present

## 2017-05-19 DIAGNOSIS — E785 Hyperlipidemia, unspecified: Secondary | ICD-10-CM | POA: Diagnosis not present

## 2017-05-19 DIAGNOSIS — Z967 Presence of other bone and tendon implants: Secondary | ICD-10-CM

## 2017-05-19 DIAGNOSIS — F321 Major depressive disorder, single episode, moderate: Secondary | ICD-10-CM | POA: Diagnosis not present

## 2017-05-19 DIAGNOSIS — E119 Type 2 diabetes mellitus without complications: Secondary | ICD-10-CM | POA: Diagnosis not present

## 2017-05-19 DIAGNOSIS — B372 Candidiasis of skin and nail: Secondary | ICD-10-CM | POA: Diagnosis not present

## 2017-05-19 DIAGNOSIS — F411 Generalized anxiety disorder: Secondary | ICD-10-CM

## 2017-05-19 DIAGNOSIS — F5101 Primary insomnia: Secondary | ICD-10-CM

## 2017-05-19 DIAGNOSIS — Z9889 Other specified postprocedural states: Secondary | ICD-10-CM

## 2017-05-19 DIAGNOSIS — I481 Persistent atrial fibrillation: Secondary | ICD-10-CM

## 2017-05-19 DIAGNOSIS — R609 Edema, unspecified: Secondary | ICD-10-CM

## 2017-05-19 DIAGNOSIS — I4819 Other persistent atrial fibrillation: Secondary | ICD-10-CM

## 2017-05-19 DIAGNOSIS — I10 Essential (primary) hypertension: Secondary | ICD-10-CM

## 2017-05-19 DIAGNOSIS — Z8781 Personal history of (healed) traumatic fracture: Secondary | ICD-10-CM

## 2017-05-19 MED ORDER — METOPROLOL TARTRATE 50 MG PO TABS: 50.0000 mg | ORAL_TABLET | Freq: Two times a day (BID) | ORAL | 0 refills | Status: DC

## 2017-05-19 MED ORDER — AMLODIPINE BESYLATE 10 MG PO TABS: 10.0000 mg | ORAL_TABLET | Freq: Every day | ORAL | 0 refills | Status: DC

## 2017-05-19 MED ORDER — ALPRAZOLAM 0.25 MG PO TABS: 0.2500 mg | ORAL_TABLET | Freq: Every day | ORAL | 2 refills | Status: DC | PRN

## 2017-05-19 MED ORDER — NYSTATIN 100000 UNIT/GM EX CREA: 1.0000 "application " | TOPICAL_CREAM | Freq: Two times a day (BID) | CUTANEOUS | 3 refills | Status: DC

## 2017-05-19 MED ORDER — APIXABAN 5 MG PO TABS: 5.0000 mg | ORAL_TABLET | Freq: Two times a day (BID) | ORAL | 0 refills | Status: DC

## 2017-05-19 MED ORDER — METFORMIN HCL 1000 MG PO TABS: 1000.0000 mg | ORAL_TABLET | Freq: Two times a day (BID) | ORAL | 1 refills | Status: DC

## 2017-05-19 MED ORDER — ZOLPIDEM TARTRATE 5 MG PO TABS: 5.0000 mg | ORAL_TABLET | Freq: Every evening | ORAL | 2 refills | Status: DC | PRN

## 2017-05-19 MED ORDER — APIXABAN 5 MG PO TABS: 5.0000 mg | ORAL_TABLET | Freq: Two times a day (BID) | ORAL | 5 refills | Status: DC

## 2017-05-19 MED ORDER — SIMVASTATIN 40 MG PO TABS: ORAL_TABLET | ORAL | 1 refills | Status: DC

## 2017-05-19 MED ORDER — ZOLPIDEM TARTRATE 5 MG PO TABS: 5.0000 mg | ORAL_TABLET | Freq: Every evening | ORAL | 1 refills | Status: DC | PRN

## 2017-05-19 MED ORDER — AMLODIPINE BESYLATE 10 MG PO TABS: 10.0000 mg | ORAL_TABLET | Freq: Every day | ORAL | 5 refills | Status: DC

## 2017-05-19 MED ORDER — QUINAPRIL HCL 40 MG PO TABS: 40.0000 mg | ORAL_TABLET | Freq: Every day | ORAL | 1 refills | Status: DC

## 2017-05-19 MED ORDER — METOPROLOL TARTRATE 50 MG PO TABS: 50.0000 mg | ORAL_TABLET | Freq: Two times a day (BID) | ORAL | 5 refills | Status: DC

## 2017-05-19 MED ORDER — CITALOPRAM HYDROBROMIDE 20 MG PO TABS: 20.0000 mg | ORAL_TABLET | Freq: Every day | ORAL | 5 refills | Status: DC

## 2017-05-19 MED ORDER — CLONIDINE HCL 0.2 MG PO TABS: 0.2000 mg | ORAL_TABLET | Freq: Two times a day (BID) | ORAL | 1 refills | Status: DC

## 2017-05-19 NOTE — Patient Instructions (Signed)

## 2017-05-19 NOTE — Progress Notes (Addendum)
Subjective:    Patient ID: Ashley [name], female    DOB: 1950/07/05, 67 y.o.   MRN: [id number]  HPI  Ashley [name] is here today for follow up of chronic medical problem.  Outpatient Encounter Medications as of 05/19/2017  Medication Sig  . amLODipine (NORVASC) 10 MG tablet Take 1 tablet (10 mg total) by mouth daily.  Marland Kitchen apixaban (ELIQUIS) 5 MG TABS tablet Take 1 tablet (5 mg total) by mouth 2 (two) times daily.  Marland Kitchen CINNAMON PO Take 1 capsule by mouth daily.   . cloNIDine (CATAPRES) 0.2 MG tablet Take 1 tablet (0.2 mg total) by mouth 2 (two) times daily.  . Ginger, Zingiber officinalis, (GINGER ROOT PO) Take 1 capsule by mouth daily.   . metFORMIN (GLUCOPHAGE) 1000 MG tablet Take 1 tablet (1,000 mg total) by mouth 2 (two) times daily with a meal.  . metoprolol tartrate (LOPRESSOR) 50 MG tablet Take 1 tablet (50 mg total) by mouth 2 (two) times daily.  . quinapril (ACCUPRIL) 40 MG tablet Take 1 tablet (40 mg total) by mouth daily.  . simvastatin (ZOCOR) 40 MG tablet TAKE 1 TABLET BY MOUTH EVERY DAY IN THE EVENING (Patient taking differently: Take 40 mg by mouth every evening. )  . Turmeric Curcumin 500 MG CAPS Take 500 mg by mouth daily.   . [DISCONTINUED] furosemide (LASIX) 40 MG tablet 1 po qam and 1/2 po in afternoon (Patient taking differently: Take 20-40 mg by mouth daily as needed for fluid. )  . acetaminophen (TYLENOL) 500 MG tablet Take 1,000 mg by mouth every 6 (six) hours as needed (for pain/headaches.).  Marland Kitchen ALPRAZolam (XANAX) 0.25 MG tablet Take 1 tablet (0.25 mg total) by mouth daily as needed for anxiety.  Marland Kitchen amoxicillin-clavulanate (AUGMENTIN) 875-125 MG tablet Take 1 tablet by mouth 2 (two) times daily. (Patient not taking: Reported on 04/19/2017)  . aspirin 325 MG tablet Take 325 mg by mouth daily at 2 PM.   . glucose blood (BAYER CONTOUR NEXT TEST) test strip Checks blood sugars 1x per day and prn.  E11.9  . HYDROcodone-acetaminophen (NORCO) 5-325 MG tablet Take 1 tablet by mouth  every 6 (six) hours as needed for moderate pain.  Marland Kitchen HYDROcodone-homatropine (HYCODAN) 5-1.5 MG/5ML syrup Take 5 mLs by mouth every 6 (six) hours as needed for cough.  . predniSONE (DELTASONE) 20 MG tablet 2 po at sametime daily for 5 days (Patient not taking: Reported on 04/19/2017)  . promethazine (PHENERGAN) 25 MG/ML injection Inject 0.25-0.5 mLs (6.25-12.5 mg total) into the vein every 15 (fifteen) minutes as needed for nausea.  Marland Kitchen zolpidem (AMBIEN) 5 MG tablet Take 1 tablet (5 mg total) by mouth at bedtime as needed for sleep.     1. Persistent atrial fibrillation ([place])  She is currently on eliquis. No bleeding problems  2. Essential hypertension  No c/o chest pain, sob or headache. Does not check blood pressure at home  3. Type 2 diabetes mellitus without complication, without long-term current use of insulin (HCC) last hgba1c was done in hospital and was 6.8%  4. 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 Had to have surgical repair. Was in rehab for 3 weeks and just came home this week.  5. Severe obesity (BMI >= 40) (HCC)  No recent weight changes  6. Peripheral edema  Has not been bad since she has been sitting most of time  7. Hyperlipidemia, unspecified  hyperlipidemia type  Has not been watching diet    New complaints: Depressed since going through all of this. Has some family issues thatare not helping the situation. Depression screen [facility] 2/9 05/19/2017 02/24/2017 01/20/2017  Decreased Interest 3 2 2   Down, Depressed, Hopeless 3 2 3   PHQ - 2 Score 6 4 5   Altered sleeping 3 2 2   Tired, decreased energy 3 2 0  Change in appetite 0 2 1  Feeling bad or failure about yourself  3 1 2   Trouble concentrating 0 1 1  Moving slowly or fidgety/restless 0 0 0  Suicidal thoughts 3 0 0  PHQ-9 Score 18 12 11   Difficult doing work/chores - - -  Some recent data might be hidden     Social history: She is home by  her self. Daughter lives close by and checks on her daiy    Review of Systems  Constitutional: Negative for activity change and appetite change.  HENT: Negative.   Eyes: Negative for pain.  Respiratory: Negative for shortness of breath.   Cardiovascular: Negative for chest pain, palpitations and leg swelling.  Gastrointestinal: Negative for abdominal pain.  Endocrine: Negative for polydipsia.  Genitourinary: Negative.   Musculoskeletal: Positive for arthralgias (left ankle in cast).  Skin: Negative for rash.  Neurological: Negative for dizziness, weakness and headaches.  Hematological: Does not bruise/bleed easily.  Psychiatric/Behavioral: Negative.   All other systems reviewed and are negative.      Objective:   Physical Exam  Constitutional: She is oriented to person, place, and time. She appears well-developed and well-nourished.  HENT:  Nose: Nose normal.  Mouth/Throat: Oropharynx is clear and moist.  Eyes: EOM are normal.  Neck: Trachea normal, normal range of motion and full passive range of motion without pain. Neck supple. No JVD present. Carotid bruit is not present. No thyromegaly present.  Cardiovascular: Normal rate, regular rhythm, normal heart sounds and intact distal pulses. Exam reveals no gallop and no friction rub.  No murmur heard. Pulmonary/Chest: Effort normal and breath sounds normal.  Abdominal: Soft. Bowel sounds are normal. She exhibits no distension and no mass. There is no tenderness.  Musculoskeletal: Normal range of motion.  Patient in wheelchair with cast on left ankle. Brisk capillary refill of toenails on left foot.  Lymphadenopathy:    She has no cervical adenopathy.  Neurological: She is alert and oriented to person, place, and time. She has normal reflexes.  Skin: Skin is warm and dry.  Erythematous moist area where abdomen folds over  Psychiatric: She has a normal mood and affect. Her behavior is normal. Judgment and thought content normal.    BP (!) 148/76   Pulse 67   Temp (!) 97.5 F (36.4 C) (Oral)         Assessment & Plan:  1. Persistent atrial fibrillation (HCC) Avoid caffeine - apixaban (ELIQUIS) 5 MG TABS tablet; Take 1 tablet (5 mg total) by mouth 2 (two) times daily.  Dispense: 60 tablet; Refill: 0  2. Essential hypertension Low sodium diet - quinapril (ACCUPRIL) 40 MG tablet; Take 1 tablet (40 mg total) by mouth daily.  Dispense: 90 tablet; Refill: 1 - cloNIDine (CATAPRES) 0.2 MG tablet; Take 1 tablet (0.2 mg total) by mouth 2 (two) times daily.  Dispense: 180 tablet; Refill: 1 - amLODipine (NORVASC) 10 MG tablet; Take 1 tablet (10 mg total) by mouth daily.  Dispense: 30 tablet; Refill: 0  3. Type 2 diabetes mellitus without complication, without long-term current use of  insulin ([place]) Continue to watch carbs in diet - metFORMIN (GLUCOPHAGE) 1000 MG tablet; Take 1 tablet (1,000 mg total) by mouth 2 (two) times daily with a meal.  Dispense: 180 tablet; Refill: 1  4. Status post ORIF of fracture of ankle Keep follow up with ortho  5. Severe obesity (BMI >= 40) (HCC) Discussed diet and exercise for person with BMI >25 Will recheck weight in 3-6 months  6. Peripheral edema Elevate legs when sitting  7. Hyperlipidemia, unspecified hyperlipidemia type Low fat diet - simvastatin (ZOCOR) 40 MG tablet; TAKE 1 TABLET BY MOUTH EVERY DAY IN THE EVENING  Dispense: 90 tablet; Refill: 1  8. GAD (generalized anxiety disorder) stress management - ALPRAZolam (XANAX) 0.25 MG tablet; Take 1 tablet (0.25 mg total) by mouth daily as needed for anxiety.  Dispense: 30 tablet; Refill: 2  9. Primary insomnia Bedtime routine - zolpidem (AMBIEN) 5 MG tablet; Take 1 tablet (5 mg total) by mouth at bedtime as needed for sleep.  Dispense: 15 tablet; Refill: 1  10. Cutaneous candidiasis nystatin cream topicaly bid- make sure area is really clean  11. depression - started on celexa 20mg  daily Stress management    Labs  pending Health maintenance reviewed Diet and exercise encouraged Continue all meds Follow up  In 3 month   [place], FNP

## 2017-05-19 NOTE — Addendum Note (Signed)
Addended by: Chevis Pretty on: 05/19/2017 10:00 AM   Modules accepted: Orders

## 2017-05-19 NOTE — Addendum Note (Signed)
Addended by: Chevis Pretty on: 05/19/2017 10:05 AM   Modules accepted: Orders

## 2017-05-31 ENCOUNTER — Other Ambulatory Visit: Payer: Self-pay

## 2017-05-31 DIAGNOSIS — E119 Type 2 diabetes mellitus without complications: Secondary | ICD-10-CM

## 2017-05-31 DIAGNOSIS — I10 Essential (primary) hypertension: Secondary | ICD-10-CM

## 2017-05-31 DIAGNOSIS — E7800 Pure hypercholesterolemia, unspecified: Secondary | ICD-10-CM

## 2017-05-31 DIAGNOSIS — I4819 Other persistent atrial fibrillation: Secondary | ICD-10-CM

## 2017-05-31 DIAGNOSIS — F321 Major depressive disorder, single episode, moderate: Secondary | ICD-10-CM

## 2017-05-31 MED ORDER — APIXABAN 5 MG PO TABS: 5.0000 mg | ORAL_TABLET | Freq: Two times a day (BID) | ORAL | 1 refills | Status: DC

## 2017-05-31 MED ORDER — SIMVASTATIN 40 MG PO TABS: ORAL_TABLET | ORAL | 1 refills | Status: DC

## 2017-05-31 MED ORDER — CLONIDINE HCL 0.2 MG PO TABS: 0.2000 mg | ORAL_TABLET | Freq: Two times a day (BID) | ORAL | 1 refills | Status: DC

## 2017-05-31 MED ORDER — METFORMIN HCL 1000 MG PO TABS: 1000.0000 mg | ORAL_TABLET | Freq: Two times a day (BID) | ORAL | 1 refills | Status: DC

## 2017-05-31 MED ORDER — GLUCOSE BLOOD VI STRP: ORAL_STRIP | 12 refills | Status: DC

## 2017-05-31 MED ORDER — CITALOPRAM HYDROBROMIDE 20 MG PO TABS: 20.0000 mg | ORAL_TABLET | Freq: Every day | ORAL | 1 refills | Status: DC

## 2017-05-31 MED ORDER — QUINAPRIL HCL 40 MG PO TABS: 40.0000 mg | ORAL_TABLET | Freq: Every day | ORAL | 1 refills | Status: DC

## 2017-05-31 MED ORDER — METOPROLOL TARTRATE 50 MG PO TABS: 50.0000 mg | ORAL_TABLET | Freq: Two times a day (BID) | ORAL | 1 refills | Status: DC

## 2017-05-31 MED ORDER — AMLODIPINE BESYLATE 10 MG PO TABS: 10.0000 mg | ORAL_TABLET | Freq: Every day | ORAL | 1 refills | Status: DC

## 2017-06-03 ENCOUNTER — Other Ambulatory Visit: Payer: Self-pay | Admitting: Nurse Practitioner

## 2017-06-03 DIAGNOSIS — I10 Essential (primary) hypertension: Secondary | ICD-10-CM

## 2017-06-05 ENCOUNTER — Other Ambulatory Visit: Payer: Self-pay | Admitting: Nurse Practitioner

## 2017-06-05 ENCOUNTER — Telehealth: Payer: Self-pay

## 2017-06-05 MED ORDER — SUVOREXANT 10 MG PO TABS: 1.0000 | ORAL_TABLET | Freq: Every day | ORAL | 1 refills | Status: DC

## 2017-06-05 NOTE — Telephone Encounter (Signed)
We can try belsomra- will send rx to pharmacy

## 2017-06-05 NOTE — Progress Notes (Signed)
belsomra

## 2017-06-05 NOTE — Telephone Encounter (Signed)
Insurance does not want to pay for ambien anymore. Patient says that it doesn't help that much and wants to know if she can try something else. Please advise

## 2017-06-05 NOTE — Telephone Encounter (Signed)
Patient's daughter notified.

## 2017-06-06 ENCOUNTER — Encounter: Payer: Self-pay | Admitting: Cardiology

## 2017-06-06 ENCOUNTER — Telehealth: Payer: Self-pay

## 2017-06-06 ENCOUNTER — Ambulatory Visit: Payer: Medicare HMO | Admitting: Cardiology

## 2017-06-06 VITALS — BP 125/76 | HR 53 | Ht 62.5 in | Wt 255.0 lb

## 2017-06-06 DIAGNOSIS — I4891 Unspecified atrial fibrillation: Secondary | ICD-10-CM

## 2017-06-06 DIAGNOSIS — I10 Essential (primary) hypertension: Secondary | ICD-10-CM | POA: Diagnosis not present

## 2017-06-06 NOTE — Progress Notes (Signed)
Clinical Summary Ashley [name] is a 67 y.o.female seen today for follow up of the following medical problems.   1. Afib - prior history of afib. Recent issues with rates during recent admission for leg fracture - has been on eliquis for stroke prevention, CHADS2Vasc score is 4  - no recent palptiations.  - compliant with eliquis.    2. HTN - compliant with meds   Past Medical History:  Diagnosis Date  . Depression   . Diabetes mellitus without complication ([place])   . Hyperlipidemia   . Hypertension      Allergies  Allergen Reactions  . Ciprofloxacin Hcl Hives     Current Outpatient Medications  Medication Sig Dispense Refill  . acetaminophen (TYLENOL) 500 MG tablet Take 1,000 mg by mouth every 6 (six) hours as needed (for pain/headaches.).    Marland Kitchen ALPRAZolam (XANAX) 0.25 MG tablet Take 1 tablet (0.25 mg total) by mouth daily as needed for anxiety. 30 tablet 2  . amLODipine (NORVASC) 10 MG tablet Take 1 tablet (10 mg total) by mouth daily. 90 tablet 1  . apixaban (ELIQUIS) 5 MG TABS tablet Take 1 tablet (5 mg total) by mouth 2 (two) times daily. 180 tablet 1  . CINNAMON PO Take 1 capsule by mouth daily.     . citalopram (CELEXA) 20 MG tablet Take 1 tablet (20 mg total) by mouth daily. 90 tablet 1  . cloNIDine (CATAPRES) 0.2 MG tablet Take 1 tablet (0.2 mg total) by mouth 2 (two) times daily. 180 tablet 1  . Ginger, Zingiber officinalis, (GINGER ROOT PO) Take 1 capsule by mouth daily.     Marland Kitchen glucose blood (BAYER CONTOUR NEXT TEST) test strip Checks blood sugars 1x per day and prn.  E11.9 100 each 12  . HYDROcodone-acetaminophen (NORCO) 5-325 MG tablet Take 1 tablet by mouth every 6 (six) hours as needed for moderate pain. 30 tablet 0  . metFORMIN (GLUCOPHAGE) 1000 MG tablet Take 1 tablet (1,000 mg total) by mouth 2 (two) times daily with a meal. 180 tablet 1  . metoprolol tartrate (LOPRESSOR) 50 MG tablet Take 1 tablet (50 mg total) by mouth 2 (two) times daily. 180 tablet 1    . nystatin cream (MYCOSTATIN) Apply 1 application topically 2 (two) times daily. 30 g 3  . quinapril (ACCUPRIL) 40 MG tablet Take 1 tablet (40 mg total) by mouth daily. 90 tablet 1  . simvastatin (ZOCOR) 40 MG tablet TAKE 1 TABLET BY MOUTH EVERY DAY IN THE EVENING 90 tablet 1  . Suvorexant (BELSOMRA) 10 MG TABS Take 1 tablet by mouth at bedtime. 30 tablet 1  . Turmeric Curcumin 500 MG CAPS Take 500 mg by mouth daily.     Marland Kitchen zolpidem (AMBIEN) 5 MG tablet Take 1 tablet (5 mg total) by mouth at bedtime as needed for sleep. 15 tablet 2   No current facility-administered medications for this visit.      Past Surgical History:  Procedure Laterality Date  . CYST EXCISION Left 03/15/2017   from buttocks  . ORIF ANKLE FRACTURE Left 04/20/2017   Procedure: OPEN REDUCTION INTERNAL FIXATION (ORIF) LEFT ANKLE FRACTURE;  Surgeon: Netta Cedars, MD;  Location: [facility];  Service: Orthopedics;  Laterality: Left;     Allergies  Allergen Reactions  . Ciprofloxacin Hcl Hives      Family History  Problem Relation Age of Onset  . Diabetes Mother   . CAD Mother        CABG at age  89  . Hypertension Mother   . Diabetes Father   . Heart disease Father        CHF  . Diabetes Sister   . CAD Sister   . Hypertension Sister   . Diabetes Brother   . CAD Brother   . Hypertension Brother   . Healthy Sister      Social History Ashley [name] reports that she has never smoked. She has never used smokeless tobacco. Ashley [name] reports that she does not drink alcohol.   Review of Systems CONSTITUTIONAL: No weight loss, fever, chills, weakness or fatigue.  HEENT: Eyes: No visual loss, blurred vision, double vision or yellow sclerae.No hearing loss, sneezing, congestion, runny nose or sore throat.  SKIN: No rash or itching.  CARDIOVASCULAR: per hpi RESPIRATORY: No shortness of breath, cough or sputum.  GASTROINTESTINAL: No anorexia, nausea, vomiting or diarrhea. No abdominal pain or blood.  GENITOURINARY:  No burning on urination, no polyuria NEUROLOGICAL: No headache, dizziness, syncope, paralysis, ataxia, numbness or tingling in the extremities. No change in bowel or bladder control.  MUSCULOSKELETAL: No muscle, back pain, joint pain or stiffness.  LYMPHATICS: No enlarged nodes. No history of splenectomy.  PSYCHIATRIC: No history of depression or anxiety.  ENDOCRINOLOGIC: No reports of sweating, cold or heat intolerance. No polyuria or polydipsia.  Marland Kitchen   Physical Examination Vitals:   06/06/17 0900  BP: 125/76  Pulse: (!) 53  SpO2: 96%   Vitals:   06/06/17 0900  Weight: 255 lb (115.7 kg)  Height: 5' 2.5" (1.588 m)    Gen: resting comfortably, no acute distress HEENT: no scleral icterus, pupils equal round and reactive, no palptable cervical adenopathy,  CV: RRR, no m/r/g no jvd Resp: Clear to auscultation bilaterally GI: abdomen is soft, non-tender, non-distended, normal bowel sounds, no hepatosplenomegaly MSK: extremities are warm, no edema.  Skin: warm, no rash Neuro:  no focal deficits Psych: appropriate affect   Diagnostic Studies  04/2017 echo Study Conclusions  - Left ventricle: The cavity size was normal. Wall thickness was   increased in a pattern of mild LVH. Systolic function was normal.   The estimated ejection fraction was in the range of 55% to 60%.   Indeterminant diastolic function (atrial fibrillation). Wall   motion was normal; there were no regional wall motion   abnormalities. - Aortic valve: There was trivial regurgitation. - Mitral valve: Mildly calcified annulus. Mildly calcified leaflets   . There was no significant regurgitation. - Left atrium: The atrium was mildly dilated. - Right ventricle: The cavity size was normal. Systolic function   was normal. - Right atrium: The atrium was mildly dilated. - Tricuspid valve: Peak RV-RA gradient (S): 23 mm Hg. - Pulmonary arteries: PA peak pressure: 26 mm Hg (S). - Inferior vena cava: The vessel was  normal in size. The   respirophasic diameter changes were in the normal range (= 50%),   consistent with normal central venous pressure.  Impressions:  - The patient was in atrial fibrillation. Normal LV size with mild   LV hypertrophy. EF 55-60%. Normal RV size and systolic function.   No significant valvular abnormalities.   Assessment and Plan   1. Afib - no symptoms. EKG today in clinic shows rate controlled afib - continue current meds  2. HTN - at goal, continue current meds    F/u 4 months     Arnoldo Lenis, M.D.

## 2017-06-06 NOTE — Telephone Encounter (Signed)
Insurance denied prior auth for Contour Next test strips  Peferred are Truemetrix, Truetrack, accu-chek smartview accu-chek aviva expert, accu-chek aviva plus accu-chek nano

## 2017-06-06 NOTE — Patient Instructions (Signed)
Your physician recommends that you schedule a follow-up appointment in: 4 MONTHS WITH DR BRANCH  Your physician recommends that you continue on your current medications as directed. Please refer to the Current Medication list given to you today.  Thank you for choosing [facility]!!    

## 2017-06-06 NOTE — Telephone Encounter (Signed)
Please orderpatien new glucose monitor and test strips

## 2017-06-07 MED ORDER — ACCU-CHEK NANO SMARTVIEW W/DEVICE KIT: PACK | 0 refills | Status: DC

## 2017-06-07 NOTE — Addendum Note (Signed)
Addended by: Rolena Infante on: 06/07/2017 12:11 PM   Modules accepted: Orders

## 2017-06-11 ENCOUNTER — Encounter: Payer: Self-pay | Admitting: Cardiology

## 2017-06-12 ENCOUNTER — Other Ambulatory Visit: Payer: Self-pay | Admitting: *Deleted

## 2017-06-12 MED ORDER — ACCU-CHEK FASTCLIX LANCETS MISC: 3 refills | Status: DC

## 2017-06-12 MED ORDER — GLUCOSE BLOOD VI STRP: ORAL_STRIP | 3 refills | Status: DC

## 2017-06-12 MED ORDER — ACCU-CHEK GUIDE ME W/DEVICE KIT: 1.0000 | PACK | Freq: Two times a day (BID) | 0 refills | Status: DC

## 2017-06-20 ENCOUNTER — Telehealth: Payer: Self-pay

## 2017-06-20 NOTE — Telephone Encounter (Signed)
Insurance denied Accu Chek Meter as non preferred  Preferred are Accu-Chek Aviva Expert, Accu-Chek Aviva Plus, Accu-Chek Guide, Accu-Chek Nano, Accu-chec Smartview

## 2017-06-20 NOTE — Telephone Encounter (Signed)
mandy please fix this

## 2017-06-28 ENCOUNTER — Other Ambulatory Visit: Payer: Self-pay

## 2017-06-28 MED ORDER — ACCU-CHEK AVIVA PLUS W/DEVICE KIT: PACK | 0 refills | Status: DC

## 2017-06-28 NOTE — Telephone Encounter (Signed)
Sent rx for preferred glucose monitor to mail order pharmacy

## 2017-07-01 DIAGNOSIS — E119 Type 2 diabetes mellitus without complications: Secondary | ICD-10-CM | POA: Diagnosis not present

## 2017-07-01 DIAGNOSIS — H40033 Anatomical narrow angle, bilateral: Secondary | ICD-10-CM | POA: Diagnosis not present

## 2017-07-01 LAB — HM DIABETES EYE EXAM

## 2017-07-18 ENCOUNTER — Telehealth: Payer: Self-pay | Admitting: *Deleted

## 2017-07-18 DIAGNOSIS — R609 Edema, unspecified: Secondary | ICD-10-CM

## 2017-07-18 MED ORDER — FUROSEMIDE 40 MG PO TABS
ORAL_TABLET | ORAL | 1 refills | Status: DC
Start: 2017-07-18 — End: 2017-08-18

## 2017-07-18 NOTE — Telephone Encounter (Signed)
Fax received [employer] Refill request Furosemide 40 mg and request 20 mg Neither dose on current med list Please advise

## 2017-07-18 NOTE — Telephone Encounter (Signed)
Six rx sent to pharmacy

## 2017-08-18 ENCOUNTER — Ambulatory Visit (INDEPENDENT_AMBULATORY_CARE_PROVIDER_SITE_OTHER): Payer: Medicare HMO | Admitting: Nurse Practitioner

## 2017-08-18 ENCOUNTER — Encounter: Payer: Self-pay | Admitting: Nurse Practitioner

## 2017-08-18 VITALS — BP 123/79 | HR 70 | Temp 97.2°F | Ht 62.0 in | Wt 271.0 lb

## 2017-08-18 DIAGNOSIS — Z8781 Personal history of (healed) traumatic fracture: Secondary | ICD-10-CM

## 2017-08-18 DIAGNOSIS — L03011 Cellulitis of right finger: Secondary | ICD-10-CM

## 2017-08-18 DIAGNOSIS — R609 Edema, unspecified: Secondary | ICD-10-CM

## 2017-08-18 DIAGNOSIS — I481 Persistent atrial fibrillation: Secondary | ICD-10-CM | POA: Diagnosis not present

## 2017-08-18 DIAGNOSIS — E785 Hyperlipidemia, unspecified: Secondary | ICD-10-CM

## 2017-08-18 DIAGNOSIS — F411 Generalized anxiety disorder: Secondary | ICD-10-CM | POA: Diagnosis not present

## 2017-08-18 DIAGNOSIS — I10 Essential (primary) hypertension: Secondary | ICD-10-CM

## 2017-08-18 DIAGNOSIS — Z9889 Other specified postprocedural states: Secondary | ICD-10-CM

## 2017-08-18 DIAGNOSIS — F321 Major depressive disorder, single episode, moderate: Secondary | ICD-10-CM

## 2017-08-18 DIAGNOSIS — E119 Type 2 diabetes mellitus without complications: Secondary | ICD-10-CM

## 2017-08-18 DIAGNOSIS — F5101 Primary insomnia: Secondary | ICD-10-CM

## 2017-08-18 DIAGNOSIS — Z967 Presence of other bone and tendon implants: Secondary | ICD-10-CM | POA: Diagnosis not present

## 2017-08-18 DIAGNOSIS — S91002A Unspecified open wound, left ankle, initial encounter: Secondary | ICD-10-CM

## 2017-08-18 DIAGNOSIS — E7800 Pure hypercholesterolemia, unspecified: Secondary | ICD-10-CM

## 2017-08-18 DIAGNOSIS — Z23 Encounter for immunization: Secondary | ICD-10-CM

## 2017-08-18 DIAGNOSIS — I4819 Other persistent atrial fibrillation: Secondary | ICD-10-CM

## 2017-08-18 LAB — BAYER DCA HB A1C WAIVED: HB A1C (BAYER DCA - WAIVED): 7.8 % — ABNORMAL HIGH (ref <7.0)

## 2017-08-18 MED ORDER — FUROSEMIDE 40 MG PO TABS: 40.0000 mg | ORAL_TABLET | ORAL | 1 refills | Status: DC

## 2017-08-18 MED ORDER — APIXABAN 5 MG PO TABS: 5.0000 mg | ORAL_TABLET | Freq: Two times a day (BID) | ORAL | 1 refills | Status: DC

## 2017-08-18 MED ORDER — QUINAPRIL HCL 40 MG PO TABS: 40.0000 mg | ORAL_TABLET | Freq: Every day | ORAL | 1 refills | Status: DC

## 2017-08-18 MED ORDER — SULFAMETHOXAZOLE-TRIMETHOPRIM 800-160 MG PO TABS: 1.0000 | ORAL_TABLET | Freq: Two times a day (BID) | ORAL | 0 refills | Status: DC

## 2017-08-18 MED ORDER — METFORMIN HCL 1000 MG PO TABS: 1000.0000 mg | ORAL_TABLET | Freq: Two times a day (BID) | ORAL | 1 refills | Status: DC

## 2017-08-18 MED ORDER — SIMVASTATIN 40 MG PO TABS: ORAL_TABLET | ORAL | 1 refills | Status: DC

## 2017-08-18 MED ORDER — ALPRAZOLAM 0.25 MG PO TABS: 0.2500 mg | ORAL_TABLET | Freq: Every day | ORAL | 2 refills | Status: DC | PRN

## 2017-08-18 MED ORDER — METOPROLOL TARTRATE 50 MG PO TABS: 50.0000 mg | ORAL_TABLET | Freq: Two times a day (BID) | ORAL | 1 refills | Status: DC

## 2017-08-18 MED ORDER — ZOLPIDEM TARTRATE 5 MG PO TABS: 5.0000 mg | ORAL_TABLET | Freq: Every evening | ORAL | 2 refills | Status: DC | PRN

## 2017-08-18 MED ORDER — CLONIDINE HCL 0.2 MG PO TABS: 0.2000 mg | ORAL_TABLET | Freq: Two times a day (BID) | ORAL | 1 refills | Status: DC

## 2017-08-18 MED ORDER — AMLODIPINE BESYLATE 10 MG PO TABS: 10.0000 mg | ORAL_TABLET | Freq: Every day | ORAL | 1 refills | Status: DC

## 2017-08-18 MED ORDER — CITALOPRAM HYDROBROMIDE 20 MG PO TABS: 20.0000 mg | ORAL_TABLET | Freq: Every day | ORAL | 1 refills | Status: DC

## 2017-08-18 NOTE — Addendum Note (Signed)
Addended by: Chevis Pretty on: 08/18/2017 07:22 PM   Modules accepted: Orders

## 2017-08-18 NOTE — Progress Notes (Signed)
Subjective:    Patient ID: Ashley [name], female    DOB: 11/02/1950, 67 y.o.   MRN: [id number]   Chief Complaint: Medical management of chronic issues  HPI:  1. Type 2 diabetes mellitus without complication, without long-term current use of insulin ([place]) last hgba1c was 6.8%. She does not check her blood sugars everyday. Is usually below 130 if checks it when she is fasting.  2. Hyperlipidemia, unspecified hyperlipidemia type  Does not really watch diet. Has ben unable to exercise to to recent ankle fracture.  3. Essential hypertension  No c/o chest pain, sob or headache. Does not check blood pressures at home.  4. Persistent atrial fibrillation (HCC)  Is on metoprolol which keeps her heart rate under control. denies feeling any palpitations  5. Peripheral edema  Edema is worse on side of ankle fracture  6. Severe obesity (BMI >= 40) (HCC)  Eight has gone up 16 lbs since last visit- due to inactivity from ankle fracture  7. Status post ORIF of fracture of ankle  Has completed treatment and PT. Doing well.    Outpatient Encounter Medications as of 08/18/2017  Medication Sig  . ACCU-CHEK FASTCLIX LANCETS MISC Test BS BID  . acetaminophen (TYLENOL) 500 MG tablet Take 1,000 mg by mouth every 6 (six) hours as needed (for pain/headaches.).  Marland Kitchen ALPRAZolam (XANAX) 0.25 MG tablet Take 1 tablet (0.25 mg total) by mouth daily as needed for anxiety.  Marland Kitchen amLODipine (NORVASC) 10 MG tablet Take 1 tablet (10 mg total) by mouth daily.  Marland Kitchen apixaban (ELIQUIS) 5 MG TABS tablet Take 1 tablet (5 mg total) by mouth 2 (two) times daily.  . Blood Glucose Monitoring Suppl (ACCU-CHEK AVIVA PLUS) w/Device KIT Use to check BS BID and prn  . Blood Glucose Monitoring Suppl (ACCU-CHEK GUIDE ME) w/Device KIT 1 kit by Does not apply route 2 (two) times daily.  Marland Kitchen CINNAMON PO Take 1 capsule by mouth daily.   . citalopram (CELEXA) 20 MG tablet Take 1 tablet (20 mg total) by mouth daily.  . cloNIDine (CATAPRES) 0.2 MG  tablet Take 1 tablet (0.2 mg total) by mouth 2 (two) times daily.  . furosemide (LASIX) 40 MG tablet 1 po qam and 1/2 po in afternoon  . Ginger, Zingiber officinalis, (GINGER ROOT PO) Take 1 capsule by mouth daily.   Marland Kitchen glucose blood (ACCU-CHEK GUIDE) test strip Test BS BID  . metFORMIN (GLUCOPHAGE) 1000 MG tablet Take 1 tablet (1,000 mg total) by mouth 2 (two) times daily with a meal.  . metoprolol tartrate (LOPRESSOR) 50 MG tablet Take 1 tablet (50 mg total) by mouth 2 (two) times daily.  Marland Kitchen nystatin cream (MYCOSTATIN) Apply 1 application topically 2 (two) times daily.  . quinapril (ACCUPRIL) 40 MG tablet Take 1 tablet (40 mg total) by mouth daily.  . simvastatin (ZOCOR) 40 MG tablet TAKE 1 TABLET BY MOUTH EVERY DAY IN THE EVENING  . Suvorexant (BELSOMRA) 10 MG TABS Take 1 tablet by mouth at bedtime.  . Turmeric Curcumin 500 MG CAPS Take 500 mg by mouth daily.   Marland Kitchen zolpidem (AMBIEN) 5 MG tablet Take 1 tablet (5 mg total) by mouth at bedtime as needed for sleep.       New complaints: None today  Social history: Lives by herself. Her mom died in Jul 14, 2022 and that was stressful.   Review of Systems  Constitutional: Negative for activity change and appetite change.  HENT: Negative.   Eyes: Negative for pain.  Respiratory: Negative for  shortness of breath.   Cardiovascular: Negative for chest pain, palpitations and leg swelling.  Gastrointestinal: Negative for abdominal pain.  Endocrine: Negative for polydipsia.  Genitourinary: Negative.   Skin: Negative for rash.  Neurological: Negative for dizziness, weakness and headaches.  Hematological: Does not bruise/bleed easily.  Psychiatric/Behavioral: Negative.   All other systems reviewed and are negative.      Objective:   Physical Exam  Constitutional: She is oriented to person, place, and time. She appears well-developed and well-nourished. No distress.  HENT:  Head: Normocephalic.  Nose: Nose normal.  Mouth/Throat: Oropharynx is  clear and moist.  Eyes: Pupils are equal, round, and reactive to light. EOM are normal.  Neck: Normal range of motion. Neck supple. No JVD present. Carotid bruit is not present.  Cardiovascular: Normal rate, regular rhythm, normal heart sounds and intact distal pulses.  Pulmonary/Chest: Effort normal and breath sounds normal. No respiratory distress. She has no wheezes. She has no rales. She exhibits no tenderness.  Abdominal: Soft. Normal appearance, normal aorta and bowel sounds are normal. She exhibits no distension, no abdominal bruit, no pulsatile midline mass and no mass. There is no splenomegaly or hepatomegaly. There is no tenderness.  pendulous abdomen  Musculoskeletal: Normal range of motion. She exhibits no edema.  2cm scabbed over lesion at top of left ankle incision-   Lymphadenopathy:    She has no cervical adenopathy.  Neurological: She is alert and oriented to person, place, and time. She has normal reflexes.  Skin: Skin is warm and dry.  Erythematous tender skin around right thumb nail  Psychiatric: She has a normal mood and affect. Her behavior is normal. Judgment and thought content normal.  Nursing note and vitals reviewed.   BP 123/79   Pulse 70   Temp (!) 97.2 F (36.2 C) (Oral)   Ht '5\' 2"'  (1.575 m)   Wt 271 lb (122.9 kg)   BMI 49.57 kg/m   hgba1c 7.8%     Assessment & Plan:  Ashley [name] comes in today with chief complaint of Medical Management of Chronic Issues   Diagnosis and orders addressed:  1. Type 2 diabetes mellitus without complication, without long-term current use of insulin (HCC) Stricter carb counting - Bayer DCA Hb A1c Waived - metFORMIN (GLUCOPHAGE) 1000 MG tablet; Take 1 tablet (1,000 mg total) by mouth 2 (two) times daily with a meal.  Dispense: 180 tablet; Refill: 1  2. Hyperlipidemia, unspecified hyperlipidemia type  low fta diet - Lipid panel  3. Essential hypertension Low sodium diet - CMP14+EGFR - quinapril (ACCUPRIL) 40 MG  tablet; Take 1 tablet (40 mg total) by mouth daily.  Dispense: 90 tablet; Refill: 1 - amLODipine (NORVASC) 10 MG tablet; Take 1 tablet (10 mg total) by mouth daily.  Dispense: 90 tablet; Refill: 1 - cloNIDine (CATAPRES) 0.2 MG tablet; Take 1 tablet (0.2 mg total) by mouth 2 (two) times daily.  Dispense: 180 tablet; Refill: 1  4. Persistent atrial fibrillation (HCC) - metoprolol tartrate (LOPRESSOR) 50 MG tablet; Take 1 tablet (50 mg total) by mouth 2 (two) times daily.  Dispense: 180 tablet; Refill: 1 - apixaban (ELIQUIS) 5 MG TABS tablet; Take 1 tablet (5 mg total) by mouth 2 (two) times daily.  Dispense: 180 tablet; Refill: 1  5. Peripheral edema elevate legs when sittting - furosemide (LASIX) 40 MG tablet; Take 1 tablet (40 mg total) by mouth every morning.  Dispense: 90 tablet; Refill: 1  6. Severe obesity (BMI >= 40) (HCC) Discussed diet and  exercise for person with BMI >25 Will recheck weight in 3-6 months  7. Status post ORIF of fracture of ankle  8. Depression, major, single episode, moderate (HCC) Stress management - citalopram (CELEXA) 20 MG tablet; Take 1 tablet (20 mg total) by mouth daily.  Dispense: 90 tablet; Refill: 1  9. GAD (generalized anxiety disorder) - ALPRAZolam (XANAX) 0.25 MG tablet; Take 1 tablet (0.25 mg total) by mouth daily as needed for anxiety.  Dispense: 30 tablet; Refill: 2  10. Primary insomnia Bedtime routine - zolpidem (AMBIEN) 5 MG tablet; Take 1 tablet (5 mg total) by mouth at bedtime as needed for sleep.  Dispense: 15 tablet; Refill: 2  11. Pure hypercholesterolemia - simvastatin (ZOCOR) 40 MG tablet; TAKE 1 TABLET BY MOUTH EVERY DAY IN THE EVENING  Dispense: 90 tablet; Refill: 1  12.  paronychia right thumb        Bactrim bid for 10 days        Epsom salt soaks  13. Left ankle wound       duoderm daily   Labs pending Health Maintenance reviewed Diet and exercise encouraged  Follow up plan: 3 months   Mary-Margaret Hassell Done, FNP

## 2017-08-18 NOTE — Patient Instructions (Signed)

## 2017-08-18 NOTE — Addendum Note (Signed)
Addended by: Rolena Infante on: 08/18/2017 04:29 PM   Modules accepted: Orders

## 2017-08-19 LAB — CMP14+EGFR
A/G RATIO: 1.2 (ref 1.2–2.2)
ALBUMIN: 3.9 g/dL (ref 3.6–4.8)
ALK PHOS: 60 IU/L (ref 39–117)
ALT: 17 IU/L (ref 0–32)
AST: 26 IU/L (ref 0–40)
BILIRUBIN TOTAL: 0.3 mg/dL (ref 0.0–1.2)
BUN / CREAT RATIO: 21 (ref 12–28)
BUN: 13 mg/dL (ref 8–27)
CHLORIDE: 97 mmol/L (ref 96–106)
CO2: 23 mmol/L (ref 20–29)
Calcium: 9.2 mg/dL (ref 8.7–10.3)
Creatinine, Ser: 0.62 mg/dL (ref 0.57–1.00)
GFR calc non Af Amer: 95 mL/min/{1.73_m2} (ref >59)
GFR, EST AFRICAN AMERICAN: 109 mL/min/{1.73_m2} (ref >59)
GLUCOSE: 149 mg/dL — AB (ref 65–99)
Globulin, Total: 3.3 g/dL (ref 1.5–4.5)
POTASSIUM: 4.6 mmol/L (ref 3.5–5.2)
Sodium: 140 mmol/L (ref 134–144)
TOTAL PROTEIN: 7.2 g/dL (ref 6.0–8.5)

## 2017-08-19 LAB — LIPID PANEL
CHOLESTEROL TOTAL: 125 mg/dL (ref 100–199)
Chol/HDL Ratio: 3.3 ratio (ref 0.0–4.4)
HDL: 38 mg/dL — ABNORMAL LOW (ref >39)
LDL Calculated: 55 mg/dL (ref 0–99)
Triglycerides: 161 mg/dL — ABNORMAL HIGH (ref 0–149)
VLDL CHOLESTEROL CAL: 32 mg/dL (ref 5–40)

## 2017-08-24 ENCOUNTER — Telehealth: Payer: Self-pay

## 2017-08-24 DIAGNOSIS — F5101 Primary insomnia: Secondary | ICD-10-CM

## 2017-08-24 MED ORDER — TRAZODONE HCL 50 MG PO TABS: 50.0000 mg | ORAL_TABLET | Freq: Every evening | ORAL | 3 refills | Status: DC | PRN

## 2017-08-24 NOTE — Addendum Note (Signed)
Addended by: Evelina Dun A on: 08/24/2017 01:14 PM   Modules accepted: Orders

## 2017-08-24 NOTE — Telephone Encounter (Signed)
Pt has to have tried both Ambien and Trazadone, and I don't see Trazadone in her medication history.

## 2017-08-24 NOTE — Telephone Encounter (Signed)
Trazadone Prescription sent to pharmacy

## 2017-08-24 NOTE — Telephone Encounter (Signed)
Insurance denied Zolpidem  Must try and fail Belsomra and Trazadone   MMM Patient

## 2017-08-24 NOTE — Telephone Encounter (Signed)
Belsomra is on patient's list. Can we call and verify she has tried this. Per insurance they have refused her Lorrin Mais until she has tried Print production planner or Trazodone.

## 2017-08-30 ENCOUNTER — Other Ambulatory Visit: Payer: Self-pay | Admitting: Nurse Practitioner

## 2017-09-26 ENCOUNTER — Telehealth: Payer: Self-pay | Admitting: Nurse Practitioner

## 2017-09-26 NOTE — Telephone Encounter (Signed)
Aware. No appointments available today. If rash get worse can have triage look at it tomorrow.

## 2017-09-27 ENCOUNTER — Other Ambulatory Visit: Payer: Self-pay | Admitting: Nurse Practitioner

## 2017-09-29 ENCOUNTER — Other Ambulatory Visit: Payer: Self-pay | Admitting: *Deleted

## 2017-09-29 MED ORDER — BD SWAB SINGLE USE REGULAR PADS: MEDICATED_PAD | 3 refills | Status: DC

## 2017-10-11 ENCOUNTER — Ambulatory Visit: Payer: Medicare HMO | Admitting: Cardiology

## 2017-10-11 ENCOUNTER — Encounter: Payer: Self-pay | Admitting: Cardiology

## 2017-10-11 VITALS — BP 127/79 | HR 89 | Ht 62.5 in | Wt 274.2 lb

## 2017-10-11 DIAGNOSIS — E782 Mixed hyperlipidemia: Secondary | ICD-10-CM | POA: Diagnosis not present

## 2017-10-11 DIAGNOSIS — I10 Essential (primary) hypertension: Secondary | ICD-10-CM

## 2017-10-11 DIAGNOSIS — I4891 Unspecified atrial fibrillation: Secondary | ICD-10-CM | POA: Diagnosis not present

## 2017-10-11 MED ORDER — APIXABAN 5 MG PO TABS: 5.0000 mg | ORAL_TABLET | Freq: Two times a day (BID) | ORAL | 1 refills | Status: DC

## 2017-10-11 NOTE — Patient Instructions (Signed)

## 2017-10-11 NOTE — Progress Notes (Signed)
Clinical Summary Ashley [name] is a 67 y.o.female seen today for follow up of the following medical problems.   1. Afib - has been on eliquis for stroke prevention, CHADS2Vasc score is 4  - no recent palpitations - compliant with meds. No bleeding on eliquis.    2. HTN - she is compliant with meds   3. Hyperlipidemia - 08/2017 TC 125 TG 161 HDL 38 LDL 55 - compliant with statin   Past Medical History:  Diagnosis Date  . Depression   . Diabetes mellitus without complication ([place])   . Hyperlipidemia   . Hypertension      Allergies  Allergen Reactions  . Ciprofloxacin Hcl Hives     Current Outpatient Medications  Medication Sig Dispense Refill  . ACCU-CHEK FASTCLIX LANCETS MISC Test BS BID 202 each 3  . acetaminophen (TYLENOL) 500 MG tablet Take 1,000 mg by mouth every 6 (six) hours as needed (for pain/headaches.).    Marland Kitchen Alcohol Swabs (B-D SINGLE USE SWABS REGULAR) PADS Use to check BS BID 200 each 3  . ALPRAZolam (XANAX) 0.25 MG tablet Take 1 tablet (0.25 mg total) by mouth daily as needed for anxiety. 30 tablet 2  . amLODipine (NORVASC) 10 MG tablet Take 1 tablet (10 mg total) by mouth daily. 90 tablet 1  . apixaban (ELIQUIS) 5 MG TABS tablet Take 1 tablet (5 mg total) by mouth 2 (two) times daily. 180 tablet 1  . Blood Glucose Monitoring Suppl (ACCU-CHEK AVIVA PLUS) w/Device KIT USE TO CHECK BLOOD SUGAR TWICE DAILY AND AS NEEDED 1 kit 0  . Blood Glucose Monitoring Suppl (ACCU-CHEK GUIDE ME) w/Device KIT 1 kit by Does not apply route 2 (two) times daily. 1 kit 0  . CINNAMON PO Take 1 capsule by mouth daily.     . citalopram (CELEXA) 20 MG tablet Take 1 tablet (20 mg total) by mouth daily. 90 tablet 1  . cloNIDine (CATAPRES) 0.2 MG tablet Take 1 tablet (0.2 mg total) by mouth 2 (two) times daily. 180 tablet 1  . furosemide (LASIX) 40 MG tablet Take 1 tablet (40 mg total) by mouth every morning. 90 tablet 1  . Ginger, Zingiber officinalis, (GINGER ROOT PO) Take 1  capsule by mouth daily.     Marland Kitchen glucose blood (ACCU-CHEK GUIDE) test strip Test BS BID 200 each 3  . metFORMIN (GLUCOPHAGE) 1000 MG tablet Take 1 tablet (1,000 mg total) by mouth 2 (two) times daily with a meal. 180 tablet 1  . metoprolol tartrate (LOPRESSOR) 50 MG tablet Take 1 tablet (50 mg total) by mouth 2 (two) times daily. 180 tablet 1  . nystatin cream (MYCOSTATIN) Apply 1 application topically 2 (two) times daily. 30 g 3  . quinapril (ACCUPRIL) 40 MG tablet Take 1 tablet (40 mg total) by mouth daily. 90 tablet 1  . simvastatin (ZOCOR) 40 MG tablet TAKE 1 TABLET BY MOUTH EVERY DAY IN THE EVENING 90 tablet 1  . sulfamethoxazole-trimethoprim (BACTRIM DS) 800-160 MG tablet Take 1 tablet by mouth 2 (two) times daily. 20 tablet 0  . traZODone (DESYREL) 50 MG tablet Take 1-2 tablets (50-100 mg total) by mouth at bedtime as needed for sleep. 60 tablet 3  . Turmeric Curcumin 500 MG CAPS Take 500 mg by mouth daily.      No current facility-administered medications for this visit.      Past Surgical History:  Procedure Laterality Date  . CYST EXCISION Left 03/15/2017   from buttocks  .  ORIF ANKLE FRACTURE Left 04/20/2017   Procedure: OPEN REDUCTION INTERNAL FIXATION (ORIF) LEFT ANKLE FRACTURE;  Surgeon: Netta Cedars, MD;  Location: [facility];  Service: Orthopedics;  Laterality: Left;     Allergies  Allergen Reactions  . Ciprofloxacin Hcl Hives      Family History  Problem Relation Age of Onset  . Diabetes Mother   . CAD Mother        CABG at age 86  . Hypertension Mother   . Diabetes Father   . Heart disease Father        CHF  . Diabetes Sister   . CAD Sister   . Hypertension Sister   . Diabetes Brother   . CAD Brother   . Hypertension Brother   . Healthy Sister      Social History Ms. Osentoski reports that she has never smoked. She has never used smokeless tobacco. Ms. Schleifer reports that she does not drink alcohol.   Review of Systems CONSTITUTIONAL: No weight loss,  fever, chills, weakness or fatigue.  HEENT: Eyes: No visual loss, blurred vision, double vision or yellow sclerae.No hearing loss, sneezing, congestion, runny nose or sore throat.  SKIN: No rash or itching.  CARDIOVASCULAR: per hpi RESPIRATORY: No shortness of breath, cough or sputum.  GASTROINTESTINAL: No anorexia, nausea, vomiting or diarrhea. No abdominal pain or blood.  GENITOURINARY: No burning on urination, no polyuria NEUROLOGICAL: No headache, dizziness, syncope, paralysis, ataxia, numbness or tingling in the extremities. No change in bowel or bladder control.  MUSCULOSKELETAL: No muscle, back pain, joint pain or stiffness.  LYMPHATICS: No enlarged nodes. No history of splenectomy.  PSYCHIATRIC: No history of depression or anxiety.  ENDOCRINOLOGIC: No reports of sweating, cold or heat intolerance. No polyuria or polydipsia.  Marland Kitchen   Physical Examination Vitals:   10/11/17 0939  BP: 127/79  Pulse: 89   Vitals:   10/11/17 0939  Weight: 274 lb 3.2 oz (124.4 kg)  Height: 5' 2.5" (1.588 m)    Gen: resting comfortably, no acute distress HEENT: no scleral icterus, pupils equal round and reactive, no palptable cervical adenopathy,  CV: irreg, 2/6 systolic murmur rusb, no jvd Resp: Clear to auscultation bilaterally GI: abdomen is soft, non-tender, non-distended, normal bowel sounds, no hepatosplenomegaly MSK: extremities are warm, no edema.  Skin: warm, no rash Neuro:  no focal deficits Psych: appropriate affect   Diagnostic Studies 04/2017 echo Study Conclusions  - Left ventricle: The cavity size was normal. Wall thickness was increased in a pattern of mild LVH. Systolic function was normal. The estimated ejection fraction was in the range of 55% to 60%. Indeterminant diastolic function (atrial fibrillation). Wall motion was normal; there were no regional wall motion abnormalities. - Aortic valve: There was trivial regurgitation. - Mitral valve: Mildly calcified  annulus. Mildly calcified leaflets . There was no significant regurgitation. - Left atrium: The atrium was mildly dilated. - Right ventricle: The cavity size was normal. Systolic function was normal. - Right atrium: The atrium was mildly dilated. - Tricuspid valve: Peak RV-RA gradient (S): 23 mm Hg. - Pulmonary arteries: PA peak pressure: 26 mm Hg (S). - Inferior vena cava: The vessel was normal in size. The respirophasic diameter changes were in the normal range (= 50%), consistent with normal central venous pressure.  Impressions:  - The patient was in atrial fibrillation. Normal LV size with mild LV hypertrophy. EF 55-60%. Normal RV size and systolic function. No significant valvular abnormalities.    Assessment and Plan  1. Afib -  no symptoms, continue current meds including anticoagulation.   2. HTN - bp is at goal, continue current meds  3. Hyperlipidemia - LDL at goal, continue statin  F/u 1 year       Arnoldo Lenis, M.D.

## 2017-10-19 ENCOUNTER — Other Ambulatory Visit: Payer: Self-pay | Admitting: *Deleted

## 2017-10-19 DIAGNOSIS — F411 Generalized anxiety disorder: Secondary | ICD-10-CM

## 2017-10-19 MED ORDER — ALPRAZOLAM 0.25 MG PO TABS: 0.2500 mg | ORAL_TABLET | Freq: Every day | ORAL | 2 refills | Status: DC | PRN

## 2017-11-02 IMAGING — DX DG SHOULDER 2+V*L*
4 series · 4 of 4 positions shown · non-contrast
Comparison: None in PACs

CLINICAL DATA: Onset of left shoulder pain 2-3 months ago without
known injury.

EXAM:
LEFT SHOULDER - 2+ VIEW

[shoulder ap]
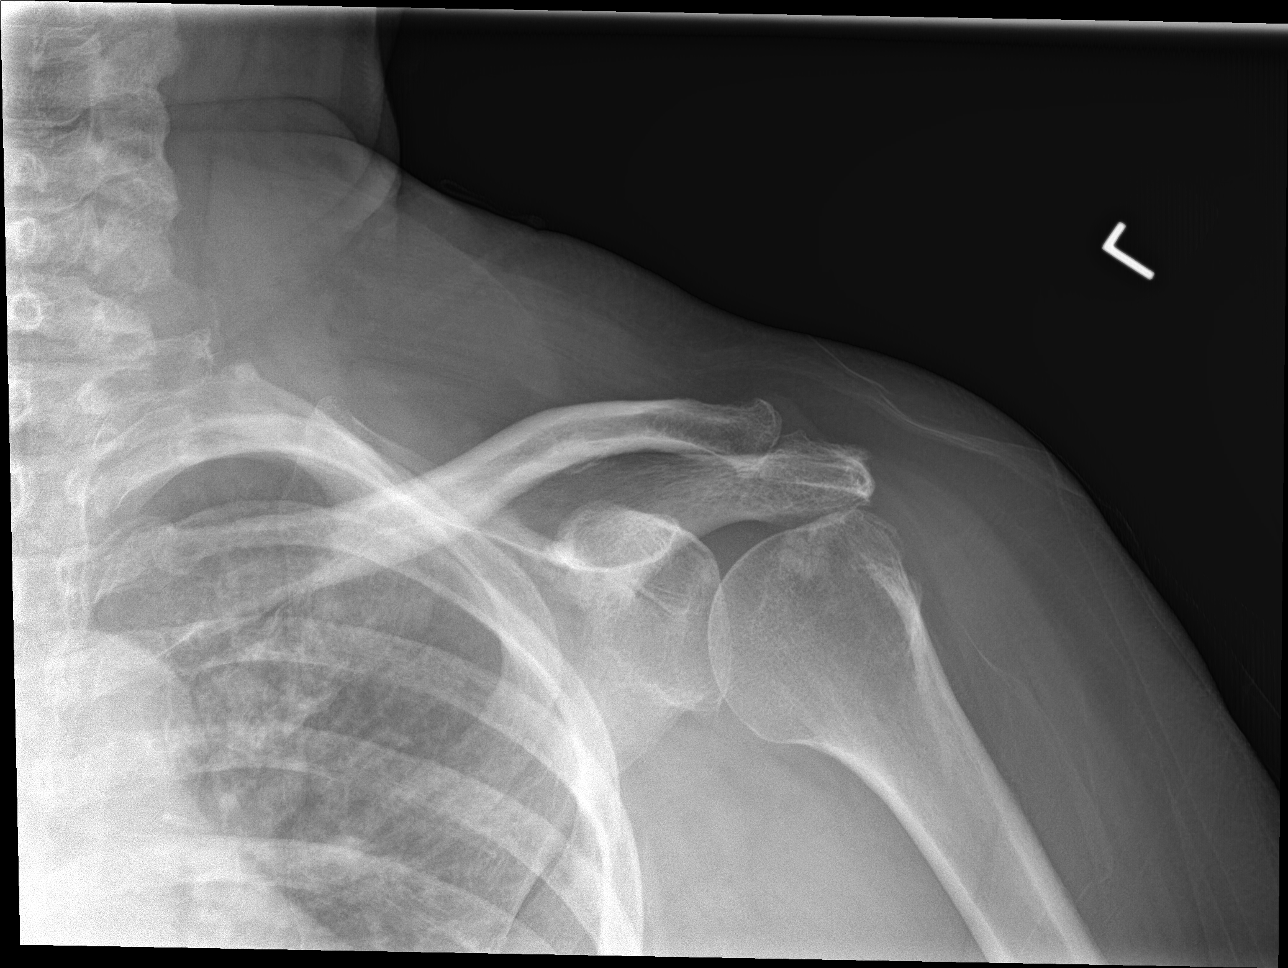

[shoulder obl]
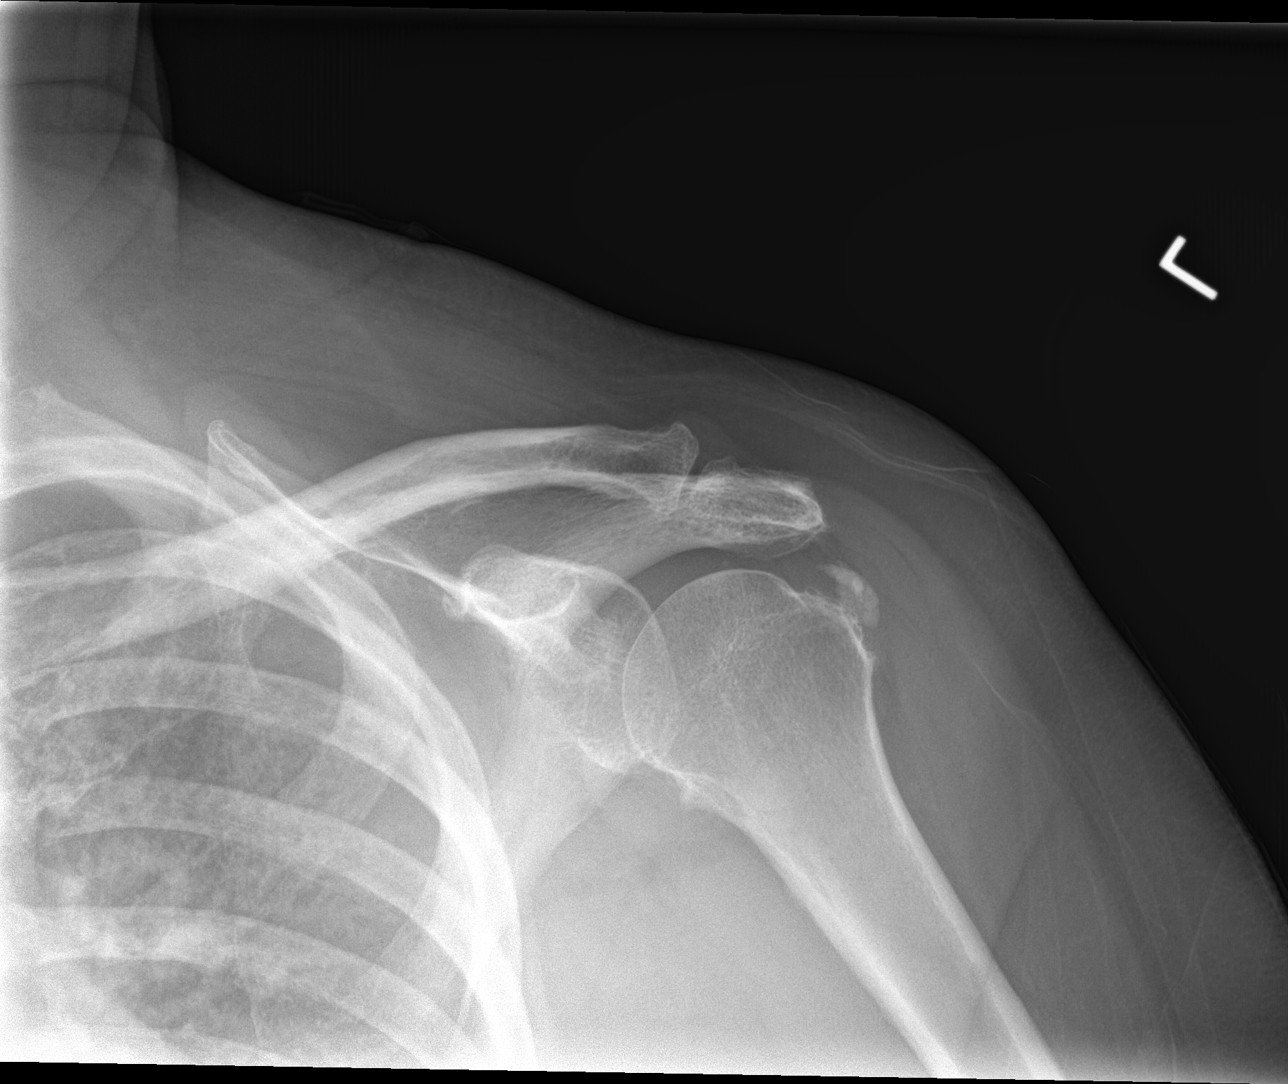

[shoulder axial]
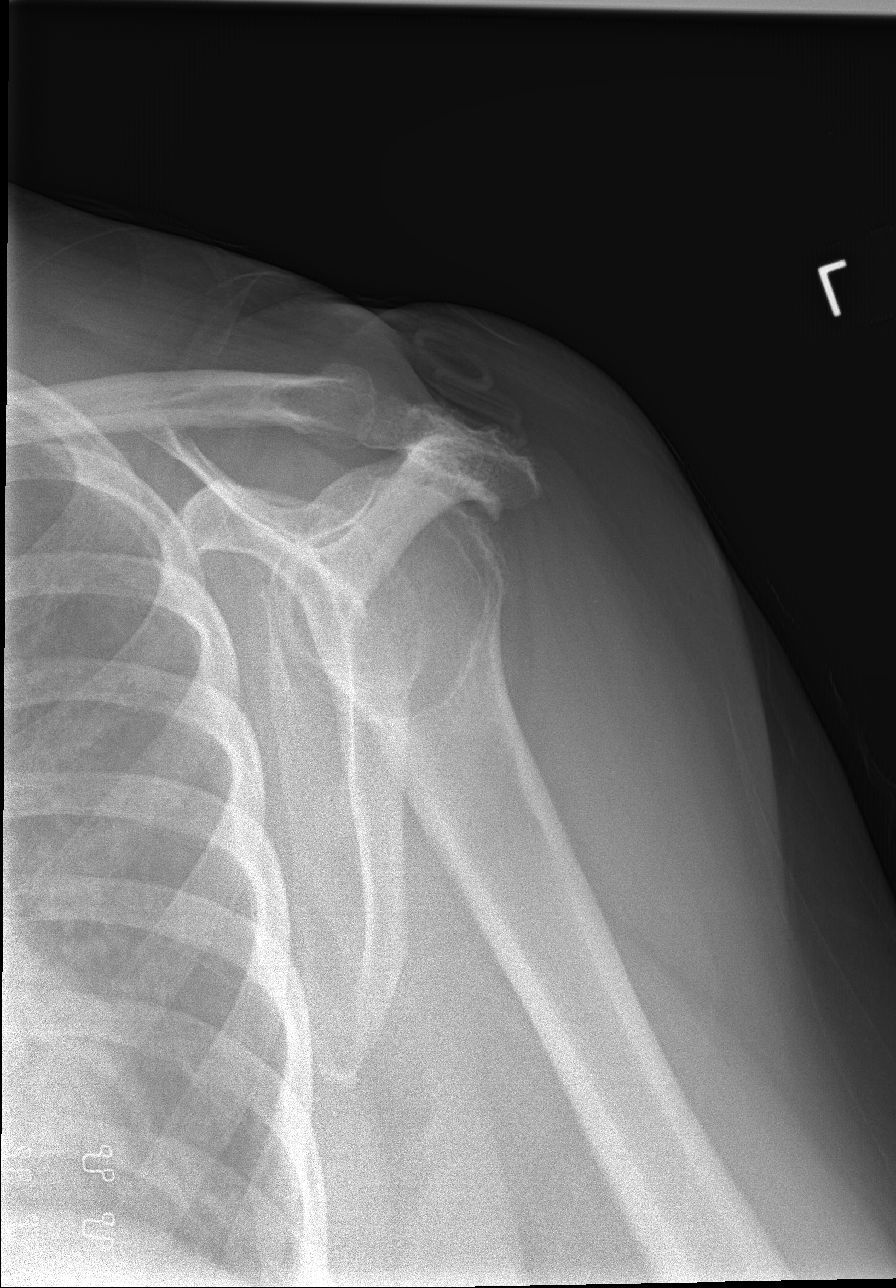

[shoulder swimmer]
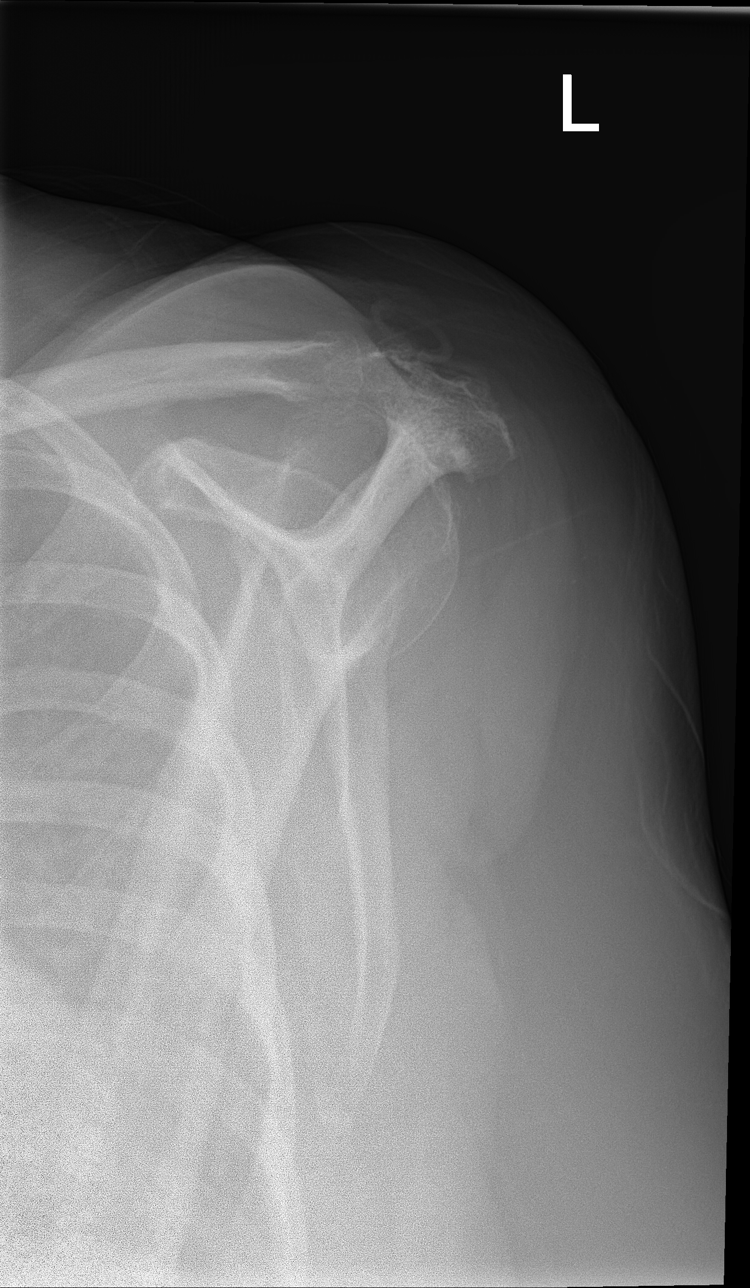

[4 of 4 positions shown; findings below may reference images not displayed]

FINDINGS: The bones are subjectively adequately mineralized. The glenohumeral
joint is unremarkable. There is spurring of the greater tuberosity
there is calcification within the substance of the distal aspect of
the rotator cuff. There is mild degenerative narrowing and marginal
osteophyte formation of the AC joint. The observed portions of the
left clavicle and upper left ribs exhibit no acute abnormalities.
IMPRESSION: Degenerative changes centered on the greater tuberosity and the AC
joint. Probable calcific tendinitis in the distal aspect of the
rotator cuff. Given the patient's persistent symptoms, MRI would be
a useful next imaging step.

## 2017-11-07 ENCOUNTER — Ambulatory Visit (INDEPENDENT_AMBULATORY_CARE_PROVIDER_SITE_OTHER): Payer: Medicare HMO | Admitting: Nurse Practitioner

## 2017-11-07 ENCOUNTER — Encounter: Payer: Self-pay | Admitting: Nurse Practitioner

## 2017-11-07 VITALS — BP 128/79 | HR 78 | Temp 97.1°F | Ht 62.0 in | Wt 272.0 lb

## 2017-11-07 DIAGNOSIS — L209 Atopic dermatitis, unspecified: Secondary | ICD-10-CM | POA: Diagnosis not present

## 2017-11-07 DIAGNOSIS — Z23 Encounter for immunization: Secondary | ICD-10-CM | POA: Diagnosis not present

## 2017-11-07 MED ORDER — CLOTRIMAZOLE-BETAMETHASONE 1-0.05 % EX CREA: 1.0000 "application " | TOPICAL_CREAM | Freq: Two times a day (BID) | CUTANEOUS | 0 refills | Status: DC

## 2017-11-07 NOTE — Progress Notes (Signed)
   Subjective:    Patient ID: Ashley [name], female    DOB: 27-Jul-1950, 67 y.o.   MRN: [id number]   Chief Complaint: Left ankle red and swollen   HPI Patient comes in today c/o left ankle red and swollen. Started 2-3 weeks ago. Has a rash on it. Itches all day long. She broke this ankle and wears a wrap on it.     Review of Systems  Constitutional: Negative for activity change and appetite change.  HENT: Negative.   Eyes: Negative for pain.  Respiratory: Negative for shortness of breath.   Cardiovascular: Negative for chest pain, palpitations and leg swelling.  Gastrointestinal: Negative for abdominal pain.  Endocrine: Negative for polydipsia.  Genitourinary: Negative.   Skin: Negative for rash.  Neurological: Negative for dizziness, weakness and headaches.  Hematological: Does not bruise/bleed easily.  Psychiatric/Behavioral: Negative.   All other systems reviewed and are negative.      Objective:   Physical Exam  Constitutional: She is oriented to person, place, and time. She appears well-developed and well-nourished. No distress.  Cardiovascular: Normal rate.  Pulmonary/Chest: Effort normal.  Neurological: She is alert and oriented to person, place, and time.  Skin: Rash (slightly raised scattered rash on left ankle.) noted. There is erythema.      BP 128/79   Pulse 78   Temp (!) 97.1 F (36.2 C) (Oral)   Ht 5\' 2"  (1.575 m)   Wt 272 lb (123.4 kg)   BMI 49.75 kg/m         Assessment & Plan:  Ashley [name] in today with chief complaint of Left ankle red and swollen   1. Atopic dermatitis, unspecified type Make sure wear sock between brce and leg Avoid scratching Cool compresses - clotrimazole-betamethasone (LOTRISONE) cream; Apply 1 application topically 2 (two) times daily.  Dispense: 30 g; Refill: 0   Mary-Margaret Hassell Done, FNP

## 2017-11-07 NOTE — Patient Instructions (Signed)

## 2017-11-07 NOTE — Addendum Note (Signed)
Addended by: Rolena Infante on: 11/07/2017 04:57 PM   Modules accepted: Orders

## 2017-11-10 ENCOUNTER — Other Ambulatory Visit: Payer: Self-pay

## 2017-11-10 ENCOUNTER — Ambulatory Visit: Payer: Medicare HMO

## 2017-11-10 DIAGNOSIS — L209 Atopic dermatitis, unspecified: Secondary | ICD-10-CM

## 2017-11-10 MED ORDER — CLOTRIMAZOLE-BETAMETHASONE 1-0.05 % EX CREA: 1.0000 "application " | TOPICAL_CREAM | Freq: Two times a day (BID) | CUTANEOUS | 0 refills | Status: DC

## 2017-11-30 ENCOUNTER — Ambulatory Visit: Payer: Medicare HMO | Admitting: Nurse Practitioner

## 2017-12-26 DIAGNOSIS — Z1231 Encounter for screening mammogram for malignant neoplasm of breast: Secondary | ICD-10-CM | POA: Diagnosis not present

## 2017-12-26 LAB — HM MAMMOGRAPHY

## 2017-12-28 ENCOUNTER — Encounter: Payer: Self-pay | Admitting: Nurse Practitioner

## 2017-12-28 ENCOUNTER — Ambulatory Visit (INDEPENDENT_AMBULATORY_CARE_PROVIDER_SITE_OTHER): Payer: Medicare HMO | Admitting: Nurse Practitioner

## 2017-12-28 VITALS — BP 156/88 | HR 70 | Temp 97.0°F | Ht 62.0 in | Wt 272.0 lb

## 2017-12-28 DIAGNOSIS — Z024 Encounter for examination for driving license: Secondary | ICD-10-CM | POA: Diagnosis not present

## 2017-12-28 MED ORDER — GLUCOSE BLOOD VI STRP: ORAL_STRIP | 12 refills | Status: DC

## 2017-12-28 NOTE — Progress Notes (Signed)
Patient ID: Ashley [name], female   DOB: 10-14-1950, 67 y.o.   MRN: [id number]  Patient in today to have DMV forms filled out- no physical assessment done.  * see scanned in documentation

## 2017-12-28 NOTE — Addendum Note (Signed)
Addended by: Rolena Infante on: 12/28/2017 12:18 PM   Modules accepted: Orders

## 2017-12-29 ENCOUNTER — Other Ambulatory Visit: Payer: Self-pay | Admitting: Nurse Practitioner

## 2018-01-01 ENCOUNTER — Other Ambulatory Visit: Payer: Self-pay | Admitting: Nurse Practitioner

## 2018-01-08 ENCOUNTER — Other Ambulatory Visit: Payer: Self-pay | Admitting: *Deleted

## 2018-01-08 MED ORDER — GLUCOSE BLOOD VI STRP: ORAL_STRIP | 3 refills | Status: DC

## 2018-01-08 MED ORDER — BD SWAB SINGLE USE REGULAR PADS: MEDICATED_PAD | 3 refills | Status: DC

## 2018-01-20 DIAGNOSIS — G44219 Episodic tension-type headache, not intractable: Secondary | ICD-10-CM | POA: Diagnosis not present

## 2018-02-05 ENCOUNTER — Other Ambulatory Visit: Payer: Self-pay | Admitting: Nurse Practitioner

## 2018-03-05 ENCOUNTER — Other Ambulatory Visit: Payer: Self-pay | Admitting: Nurse Practitioner

## 2018-03-05 DIAGNOSIS — E7800 Pure hypercholesterolemia, unspecified: Secondary | ICD-10-CM

## 2018-03-05 DIAGNOSIS — E119 Type 2 diabetes mellitus without complications: Secondary | ICD-10-CM

## 2018-03-05 DIAGNOSIS — I10 Essential (primary) hypertension: Secondary | ICD-10-CM

## 2018-03-20 ENCOUNTER — Other Ambulatory Visit: Payer: Self-pay | Admitting: *Deleted

## 2018-03-20 MED ORDER — ACCU-CHEK SOFTCLIX LANCETS MISC: 3 refills | Status: DC

## 2018-03-29 ENCOUNTER — Telehealth: Payer: Self-pay | Admitting: Nurse Practitioner

## 2018-04-13 ENCOUNTER — Encounter: Payer: Self-pay | Admitting: *Deleted

## 2018-04-13 ENCOUNTER — Ambulatory Visit (INDEPENDENT_AMBULATORY_CARE_PROVIDER_SITE_OTHER): Payer: Medicare HMO | Admitting: *Deleted

## 2018-04-13 VITALS — BP 136/76 | HR 85 | Ht 62.0 in | Wt 270.0 lb

## 2018-04-13 DIAGNOSIS — Z0000 Encounter for general adult medical examination without abnormal findings: Secondary | ICD-10-CM

## 2018-04-13 NOTE — Patient Instructions (Signed)
Please work on your goal of increasing your exercising 3 times per week for 30 minutes each session- walking or riding stationary bicycle are great options.   At your convenience, please bring a copy of your Advance Directives (Healthcare Power of Attorney and Living Will) to our office to be filed in your medical record.  Please continue to move carefully to avoid falls.   Please follow up with Chevis Pretty, FNP as scheduled.  Thank you for coming in for your Annual Wellness Visit today!   Preventive Care 34 Years and Older, Female Preventive care refers to lifestyle choices and visits with your health care provider that can promote health and wellness. What does preventive care include?  A yearly physical exam. This is also called an annual well check.  Dental exams once or twice a year.  Routine eye exams. Ask your health care provider how often you should have your eyes checked.  Personal lifestyle choices, including: ? Daily care of your teeth and gums. ? Regular physical activity. ? Eating a healthy diet. ? Avoiding tobacco and drug use. ? Limiting alcohol use. ? Practicing safe sex. ? Taking low-dose aspirin every day. ? Taking vitamin and mineral supplements as recommended by your health care provider. What happens during an annual well check? The services and screenings done by your health care provider during your annual well check will depend on your age, overall health, lifestyle risk factors, and family history of disease. Counseling Your health care provider may ask you questions about your:  Alcohol use.  Tobacco use.  Drug use.  Emotional well-being.  Home and relationship well-being.  Sexual activity.  Eating habits.  History of falls.  Memory and ability to understand (cognition).  Work and work Statistician.  Reproductive health.  Screening You may have the following tests or measurements:  Height, weight, and BMI.  Blood  pressure.  Lipid and cholesterol levels. These may be checked every 5 years, or more frequently if you are over 50 years old.  Skin check.  Lung cancer screening. You may have this screening every year starting at age 83 if you have a 30-pack-year history of smoking and currently smoke or have quit within the past 15 years.  Colorectal cancer screening. All adults should have this screening starting at age 73 and continuing until age 19. You will have tests every 1-10 years, depending on your results and the type of screening test. People at increased risk should start screening at an earlier age. Screening tests may include: ? Guaiac-based fecal occult blood testing. ? Fecal immunochemical test (FIT). ? Stool DNA test. ? Virtual colonoscopy. ? Sigmoidoscopy. During this test, a flexible tube with a tiny camera (sigmoidoscope) is used to examine your rectum and lower colon. The sigmoidoscope is inserted through your anus into your rectum and lower colon. ? Colonoscopy. During this test, a long, thin, flexible tube with a tiny camera (colonoscope) is used to examine your entire colon and rectum.  Hepatitis C blood test.  Hepatitis B blood test.  Sexually transmitted disease (STD) testing.  Diabetes screening. This is done by checking your blood sugar (glucose) after you have not eaten for a while (fasting). You may have this done every 1-3 years.  Bone density scan. This is done to screen for osteoporosis. You may have this done starting at age 9.  Mammogram. This may be done every 1-2 years. Talk to your health care provider about how often you should have regular mammograms. Talk  with your health care provider about your test results, treatment options, and if necessary, the need for more tests. Vaccines Your health care provider may recommend certain vaccines, such as:  Influenza vaccine. This is recommended every year.  Tetanus, diphtheria, and acellular pertussis (Tdap, Td)  vaccine. You may need a Td booster every 10 years.  Varicella vaccine. You may need this if you have not been vaccinated.  Zoster vaccine. You may need this after age 62.  Measles, mumps, and rubella (MMR) vaccine. You may need at least one dose of MMR if you were born in 1957 or later. You may also need a second dose.  Pneumococcal 13-valent conjugate (PCV13) vaccine. One dose is recommended after age 27.  Pneumococcal polysaccharide (PPSV23) vaccine. One dose is recommended after age 10.  Meningococcal vaccine. You may need this if you have certain conditions.  Hepatitis A vaccine. You may need this if you have certain conditions or if you travel or work in places where you may be exposed to hepatitis A.  Hepatitis B vaccine. You may need this if you have certain conditions or if you travel or work in places where you may be exposed to hepatitis B.  Haemophilus influenzae type b (Hib) vaccine. You may need this if you have certain conditions. Talk to your health care provider about which screenings and vaccines you need and how often you need them. This information is not intended to replace advice given to you by your health care provider. Make sure you discuss any questions you have with your health care provider. Document Released: 02/20/2015 Document Revised: 03/16/2017 Document Reviewed: 11/25/2014 Elsevier Interactive Patient Education  2019 [place].   Diabetes Mellitus and Nutrition, Adult When you have diabetes (diabetes mellitus), it is very important to have healthy eating habits because your blood sugar (glucose) levels are greatly affected by what you eat and drink. Eating healthy foods in the appropriate amounts, at about the same times every day, can help you:  Control your blood glucose.  Lower your risk of heart disease.  Improve your blood pressure.  Reach or maintain a healthy weight. Every person with diabetes is different, and each person has different  needs for a meal plan. Your health care provider may recommend that you work with a diet and nutrition specialist (dietitian) to make a meal plan that is best for you. Your meal plan may vary depending on factors such as:  The calories you need.  The medicines you take.  Your weight.  Your blood glucose, blood pressure, and cholesterol levels.  Your activity level.  Other health conditions you have, such as heart or kidney disease. How do carbohydrates affect me? Carbohydrates, also called carbs, affect your blood glucose level more than any other type of food. Eating carbs naturally raises the amount of glucose in your blood. Carb counting is a method for keeping track of how many carbs you eat. Counting carbs is important to keep your blood glucose at a healthy level, especially if you use insulin or take certain oral diabetes medicines. It is important to know how many carbs you can safely have in each meal. This is different for every person. Your dietitian can help you calculate how many carbs you should have at each meal and for each snack. Foods that contain carbs include:  Bread, cereal, rice, pasta, and crackers.  Potatoes and corn.  Peas, beans, and lentils.  Milk and yogurt.  Fruit and juice.  Desserts, such as cakes,  cookies, ice cream, and candy. How does alcohol affect me? Alcohol can cause a sudden decrease in blood glucose (hypoglycemia), especially if you use insulin or take certain oral diabetes medicines. Hypoglycemia can be a life-threatening condition. Symptoms of hypoglycemia (sleepiness, dizziness, and confusion) are similar to symptoms of having too much alcohol. If your health care provider says that alcohol is safe for you, follow these guidelines:  Limit alcohol intake to no more than 1 drink per day for nonpregnant women and 2 drinks per day for men. One drink equals 12 oz of beer, 5 oz of wine, or 1 oz of hard liquor.  Do not drink on an empty  stomach.  Keep yourself hydrated with water, diet soda, or unsweetened iced tea.  Keep in mind that regular soda, juice, and other mixers may contain a lot of sugar and must be counted as carbs. What are tips for following this plan?  Reading food labels  Start by checking the serving size on the "Nutrition Facts" label of packaged foods and drinks. The amount of calories, carbs, fats, and other nutrients listed on the label is based on one serving of the item. Many items contain more than one serving per package.  Check the total grams (g) of carbs in one serving. You can calculate the number of servings of carbs in one serving by dividing the total carbs by 15. For example, if a food has 30 g of total carbs, it would be equal to 2 servings of carbs.  Check the number of grams (g) of saturated and trans fats in one serving. Choose foods that have low or no amount of these fats.  Check the number of milligrams (mg) of salt (sodium) in one serving. Most people should limit total sodium intake to less than 2,300 mg per day.  Always check the nutrition information of foods labeled as "low-fat" or "nonfat". These foods may be higher in added sugar or refined carbs and should be avoided.  Talk to your dietitian to identify your daily goals for nutrients listed on the label. Shopping  Avoid buying canned, premade, or processed foods. These foods tend to be high in fat, sodium, and added sugar.  Shop around the outside edge of the grocery store. This includes fresh fruits and vegetables, bulk grains, fresh meats, and fresh dairy. Cooking  Use low-heat cooking methods, such as baking, instead of high-heat cooking methods like deep frying.  Cook using healthy oils, such as olive, canola, or sunflower oil.  Avoid cooking with butter, cream, or high-fat meats. Meal planning  Eat meals and snacks regularly, preferably at the same times every day. Avoid going long periods of time without  eating.  Eat foods high in fiber, such as fresh fruits, vegetables, beans, and whole grains. Talk to your dietitian about how many servings of carbs you can eat at each meal.  Eat 4-6 ounces (oz) of lean protein each day, such as lean meat, chicken, fish, eggs, or tofu. One oz of lean protein is equal to: ? 1 oz of meat, chicken, or fish. ? 1 egg. ?  cup of tofu.  Eat some foods each day that contain healthy fats, such as avocado, nuts, seeds, and fish. Lifestyle  Check your blood glucose regularly.  Exercise regularly as told by your health care provider. This may include: ? 150 minutes of moderate-intensity or vigorous-intensity exercise each week. This could be brisk walking, biking, or water aerobics. ? Stretching and doing strength exercises, such as  yoga or weightlifting, at least 2 times a week.  Take medicines as told by your health care provider.  Do not use any products that contain nicotine or tobacco, such as cigarettes and e-cigarettes. If you need help quitting, ask your health care provider.  Work with a Social worker or diabetes educator to identify strategies to manage stress and any emotional and social challenges. Questions to ask a health care provider  Do I need to meet with a diabetes educator?  Do I need to meet with a dietitian?  What number can I call if I have questions?  When are the best times to check my blood glucose? Where to find more information:  American Diabetes Association: diabetes.org  Academy of Nutrition and Dietetics: www.eatright.[employer] of Diabetes and Digestive and Kidney Diseases (NIH): [employer] Summary  A healthy meal plan will help you control your blood glucose and maintain a healthy lifestyle.  Working with a diet and nutrition specialist (dietitian) can help you make a meal plan that is best for you.  Keep in mind that carbohydrates (carbs) and alcohol have immediate effects on your blood glucose  levels. It is important to count carbs and to use alcohol carefully. This information is not intended to replace advice given to you by your health care provider. Make sure you discuss any questions you have with your health care provider. Document Released: 10/21/2004 Document Revised: 08/24/2016 Document Reviewed: 02/29/2016 Elsevier Interactive Patient Education  2019 [employer].

## 2018-04-13 NOTE — Progress Notes (Addendum)
Subjective:   Ashley [name] is a 68 y.o. female who presents for a Initial Medicare Annual Wellness Visit.  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  She enjoys being active in her church, sewing and gardening.  She is widowed, lives alone, and has an outside dog.  She has 2 daughters and 4 grandchildren.  She is very close with one of her daughters who lives next door to her.  She sees her daily.  Ms. Bellotti also stays active going to her grandchildren's sporting events.  She broke her left ankle after missing a step on her school bus 04/05/2017.  The ankle fracture required surgery.  She is recovered from this injury and doing much better now.  Patient Care Team: Chevis Pretty, FNP as PCP - General (Nurse Practitioner) Arnoldo Lenis, MD as PCP - Cardiology (Cardiology) Netta Cedars, MD as Consulting Physician (Orthopedic Surgery)  Hospitalizations, surgeries, and ER visits in previous 12 months No hospitalizations, ER visits, or surgeries this past year.   Review of Systems    Patient reports that her overall health is unchanged compared to last year.  Cardiac Risk Factors include: diabetes mellitus;dyslipidemia;hypertension;obesity (BMI >30kg/m2);sedentary lifestyle   All other systems negative       Current Medications (verified) Outpatient Encounter Medications as of 04/13/2018  Medication Sig  . ACCU-CHEK SOFTCLIX LANCETS lancets Test BS BID  . acetaminophen (TYLENOL) 500 MG tablet Take 1,000 mg by mouth every 6 (six) hours as needed (for pain/headaches.).  Marland Kitchen Alcohol Swabs (B-D SINGLE USE SWABS REGULAR) PADS Use to check BS BID  . ALPRAZolam (XANAX) 0.25 MG tablet Take 1 tablet (0.25 mg total) by mouth daily as needed for anxiety.  Marland Kitchen amLODipine (NORVASC) 5 MG tablet Take 5 mg by mouth daily.  Marland Kitchen apixaban (ELIQUIS) 5 MG TABS tablet Take 1 tablet (5 mg total) by mouth 2 (two) times daily.  . Blood Glucose Monitoring Suppl  (ACCU-CHEK AVIVA PLUS) w/Device KIT USE AS DIRECTED  . Blood Glucose Monitoring Suppl (ACCU-CHEK GUIDE ME) w/Device KIT 1 kit by Does not apply route 2 (two) times daily.  Marland Kitchen CINNAMON PO Take 1 capsule by mouth daily.   . cloNIDine (CATAPRES) 0.2 MG tablet Take 1 tablet (0.2 mg total) by mouth 2 (two) times daily.  . clotrimazole-betamethasone (LOTRISONE) cream Apply 1 application topically 2 (two) times daily.  . furosemide (LASIX) 20 MG tablet Take 20 mg by mouth.  . Ginger, Zingiber officinalis, (GINGER ROOT PO) Take 1 capsule by mouth daily.   Marland Kitchen glucose blood (ACCU-CHEK AVIVA) test strip Use to check BS BID  . metFORMIN (GLUCOPHAGE) 1000 MG tablet TAKE 1 TABLET TWO TIMES DAILY WITH A MEAL.  . metoprolol tartrate (LOPRESSOR) 50 MG tablet TAKE 1 TABLET TWO TIMES DAILY (Patient taking differently: Take 25 mg by mouth 2 (two) times daily. )  . nystatin cream (MYCOSTATIN) Apply 1 application topically 2 (two) times daily.  . quinapril (ACCUPRIL) 40 MG tablet TAKE 1 TABLET DAILY.  . simvastatin (ZOCOR) 40 MG tablet TAKE 1 TABLET EVERY DAY IN THE EVENING  . Turmeric Curcumin 500 MG CAPS Take 500 mg by mouth daily.   . [DISCONTINUED] amLODipine (NORVASC) 10 MG tablet TAKE 1 TABLET EVERY DAY  . [DISCONTINUED] citalopram (CELEXA) 20 MG tablet TAKE 1 TABLET DAILY.  . [DISCONTINUED] furosemide (LASIX) 40 MG tablet TAKE 1 TABLET EVERY MORNING.   No facility-administered encounter medications on file as of 04/13/2018.  Allergies (verified) Ciprofloxacin hcl   History: Past Medical History:  Diagnosis Date  . Depression   . Diabetes mellitus without complication ([place])   . Hyperlipidemia   . Hypertension    Past Surgical History:  Procedure Laterality Date  . CYST EXCISION Left 03/15/2017   from buttocks  . ORIF ANKLE FRACTURE Left 04/20/2017   Procedure: OPEN REDUCTION INTERNAL FIXATION (ORIF) LEFT ANKLE FRACTURE;  Surgeon: Netta Cedars, MD;  Location: [facility];  Service: Orthopedics;   Laterality: Left;   Family History  Problem Relation Age of Onset  . Diabetes Mother   . CAD Mother        CABG at age 83  . Hypertension Mother   . Diabetes Father   . Heart disease Father        CHF  . Diabetes Sister   . CAD Sister   . Hypertension Sister   . Diabetes Brother   . Hypertension Brother   . Healthy Sister    Social History   Socioeconomic History  . Marital status: Widowed    Spouse name: Not on file  . Number of children: 2  . Years of education: Not on file  . Highest education level: High school graduate  Occupational History  . Occupation: School bus Education administrator: [employer]  . Financial resource strain: Not hard at all  . Food insecurity:    Worry: Never true    Inability: Never true  . Transportation needs:    Medical: No    Non-medical: No  Tobacco Use  . Smoking status: Never Smoker  . Smokeless tobacco: Never Used  Substance and Sexual Activity  . Alcohol use: No  . Drug use: No  . Sexual activity: Not on file  Lifestyle  . Physical activity:    Days per week: 0 days    Minutes per session: 0 min  . Stress: Only a little  Relationships  . Social connections:    Talks on phone: More than three times a week    Gets together: More than three times a week    Attends religious service: More than 4 times per year    Active member of club or organization: No    Attends meetings of clubs or organizations: Never    Relationship status: Widowed  Other Topics Concern  . Not on file  Social History Narrative  . Not on file     Clinical Intake:     Pain Score: 0-No pain                  Activities of Daily Living In your present state of health, do you have any difficulty performing the following activities: 04/13/2018 04/24/2017  Hearing? N -  Vision? N -  Difficulty concentrating or making decisions? N -  Walking or climbing stairs? N -  Dressing or bathing? N -  Doing errands, shopping?  N N  Preparing Food and eating ? N -  Using the Toilet? N -  In the past six months, have you accidently leaked urine? Y -  Comment with coughing or sneezing, or urgency with waiting too long to go -  Do you have problems with loss of bowel control? Y -  Comment rarely -  Managing your Medications? N -  Managing your Finances? N -  Housekeeping or managing your Housekeeping? N -  Some recent data might be hidden     Exercise Current Exercise  Habits: The patient does not participate in regular exercise at present, Exercise limited by: orthopedic condition(s)  Diet Consumes 2 meals a day and 1 snacks a day.  The patient feels that she mostly follow a Regular diet.  Diet History  Patient states she usually eats a sandwich or eggs, bacon and biscuit for breakfast, and vegetables and meat for supper.  Recommended processed meat such as bacon and sausage, and red meat no more than once per week.  Also recommended diet of mostly non-starchy vegetables, lean proteins, and fruits and whole grains in moderation.  Depression Screen PHQ 2/9 Scores 04/13/2018 12/28/2017 11/07/2017 08/18/2017 05/19/2017 02/24/2017 01/20/2017  PHQ - 2 Score 1 0 '3 3 6 4 5  ' PHQ- 9 Score - - '10 10 18 12 11  ' Exception Documentation - - - - - - -  Not completed - - - - - - -     Fall Risk Fall Risk  04/13/2018 12/28/2017 11/07/2017 08/18/2017 05/19/2017  Falls in the past year? 0 0 No No Yes  Number falls in past yr: - - - - 1  Injury with Fall? - - - - Yes  Comment - - - - Broken left arnkle  Risk for fall due to : History of fall(s) - - - -     Objective:    Today's Vitals   04/13/18 1112  BP: 136/76  Pulse: 85  Weight: 270 lb (122.5 kg)  Height: '5\' 2"'  (1.575 m)  PainSc: 0-No pain   Body mass index is 49.38 kg/m.  Advanced Directives 04/13/2018 04/20/2017  Does Patient Have a Medical Advance Directive? Yes No  Type of Paramedic of [place];Living will -  Does patient want to make  changes to medical advance directive? No - Patient declined -  Copy of [place] in Chart? No - copy requested -  Would patient like information on creating a medical advance directive? - No - Patient declined    Hearing/Vision  No hearing or vision deficits noted during visit. Goes to [place] yearly for eye exams.  Cognitive Function: MMSE - Mini Mental State Exam 04/13/2018 04/13/2018  Orientation to time 5 5  Orientation to Place 5 5  Registration 3 -  Attention/ Calculation 4 -  Recall 1 -  Language- name 2 objects 2 -  Language- repeat 1 -  Language- follow 3 step command 3 -  Language- read & follow direction 1 -  Write a sentence 1 -  Copy design 1 -  Total score 27 -        Immunizations and Health Maintenance Immunization History  Administered Date(s) Administered  . Influenza, High Dose Seasonal PF 11/07/2017  . Influenza,inj,Quad PF,6+ Mos 10/31/2012, 11/14/2014, 11/03/2016  . Influenza-Unspecified 10/25/2013  . Pneumococcal Polysaccharide-23 10/31/2012, 05/03/2017  . Tdap 08/18/2017  . Zoster 01/08/2013  . Zoster Recombinat (Shingrix) 08/18/2017, 11/07/2017   Health Maintenance Due  Topic Date Due  . HEMOGLOBIN A1C  02/18/2018      Health Maintenance  Topic Date Due  . HEMOGLOBIN A1C  02/18/2018  . Hepatitis C Screening  08/19/2018 (Originally 27-Aug-1950)  . PNA vac Low Risk Adult (2 of 2 - PCV13) 05/04/2018  . OPHTHALMOLOGY EXAM  07/02/2018  . FOOT EXAM  08/19/2018  . MAMMOGRAM  12/27/2019  . COLONOSCOPY  03/22/2020  . TETANUS/TDAP  08/19/2027  . INFLUENZA VACCINE  Completed  . DEXA SCAN  Completed    Recommend hemoglobin A1c and  hepatitis C screening at next visit with Chevis Pretty, [place].      Assessment:   This is a routine wellness examination for Kinya.    Plan:    Goals    . Exercise 3 times per week for 30 minutes      Walking and riding stationary bicycle are great options.        Health  Maintenance & Additional Screening Recommendations  Advanced directives: has an advanced directive - a copy HAS NOT been provided.  Lung: Low Dose CT Chest recommended if Age 22-80 years, 30 pack-year currently smoking OR have quit w/in 15years. Patient does not qualify. Hepatitis C Screening recommended: yes    Keep f/u with Chevis Pretty, FNP and any other specialty appointments you may have Continue current medications Move carefully to avoid falls.  Aim for at least 150 minutes of moderate activity a week. Walking and riding stationary bicycle are great options. Read or work on puzzles daily Stay connected with friends and family  I have personally reviewed and noted the following in the patient's chart:   . Medical and social history . Use of alcohol, tobacco or illicit drugs  . Current medications and supplements . Functional ability and status . Nutritional status . Physical activity . Advanced directives . List of other physicians . Hospitalizations, surgeries, and ER visits in previous 12 months . Vitals . Screenings to include cognitive, depression, and falls . Referrals and appointments  In addition, I have reviewed and discussed with patient certain preventive protocols, quality metrics, and best practice recommendations. A written personalized care plan for preventive services as well as general preventive health recommendations were provided to patient.     Nolberto Hanlon, RN  04/13/2018  I have reviewed and agree with the above AWV documentation.   Mary-Margaret Hassell Done, FNP

## 2018-04-25 ENCOUNTER — Other Ambulatory Visit: Payer: Self-pay | Admitting: Nurse Practitioner

## 2018-05-25 ENCOUNTER — Other Ambulatory Visit: Payer: Self-pay | Admitting: Cardiology

## 2018-05-25 DIAGNOSIS — I4891 Unspecified atrial fibrillation: Secondary | ICD-10-CM

## 2018-05-29 NOTE — Telephone Encounter (Signed)
68yo, 122.5kg, Scr 0.62 on 08/18/17 Last OV 10/11/17 Indication Afib

## 2018-06-09 ENCOUNTER — Other Ambulatory Visit: Payer: Self-pay | Admitting: Nurse Practitioner

## 2018-06-09 DIAGNOSIS — E119 Type 2 diabetes mellitus without complications: Secondary | ICD-10-CM

## 2018-06-09 DIAGNOSIS — I10 Essential (primary) hypertension: Secondary | ICD-10-CM

## 2018-06-12 ENCOUNTER — Other Ambulatory Visit: Payer: Self-pay | Admitting: Nurse Practitioner

## 2018-06-12 DIAGNOSIS — I10 Essential (primary) hypertension: Secondary | ICD-10-CM

## 2018-06-12 DIAGNOSIS — E119 Type 2 diabetes mellitus without complications: Secondary | ICD-10-CM

## 2018-06-13 NOTE — Telephone Encounter (Signed)
MMM. NTBS. Last OV for Dxs 08/18/17.

## 2018-06-15 ENCOUNTER — Other Ambulatory Visit: Payer: Self-pay | Admitting: Nurse Practitioner

## 2018-06-18 ENCOUNTER — Other Ambulatory Visit: Payer: Self-pay | Admitting: Nurse Practitioner

## 2018-06-19 ENCOUNTER — Other Ambulatory Visit: Payer: Self-pay | Admitting: Nurse Practitioner

## 2018-06-19 DIAGNOSIS — I10 Essential (primary) hypertension: Secondary | ICD-10-CM

## 2018-06-19 NOTE — Telephone Encounter (Signed)
Needs to be seen

## 2018-06-19 NOTE — Telephone Encounter (Signed)
Apt scheduled.  

## 2018-06-26 ENCOUNTER — Other Ambulatory Visit: Payer: Self-pay

## 2018-06-27 ENCOUNTER — Encounter: Payer: Self-pay | Admitting: Nurse Practitioner

## 2018-06-27 ENCOUNTER — Other Ambulatory Visit: Payer: Self-pay

## 2018-06-27 ENCOUNTER — Telehealth: Payer: Self-pay | Admitting: Nurse Practitioner

## 2018-06-27 ENCOUNTER — Ambulatory Visit (INDEPENDENT_AMBULATORY_CARE_PROVIDER_SITE_OTHER): Payer: Medicare HMO | Admitting: Nurse Practitioner

## 2018-06-27 VITALS — BP 126/74 | HR 70 | Temp 97.2°F | Ht 62.0 in | Wt 263.0 lb

## 2018-06-27 DIAGNOSIS — I10 Essential (primary) hypertension: Secondary | ICD-10-CM

## 2018-06-27 DIAGNOSIS — R609 Edema, unspecified: Secondary | ICD-10-CM | POA: Diagnosis not present

## 2018-06-27 DIAGNOSIS — E119 Type 2 diabetes mellitus without complications: Secondary | ICD-10-CM | POA: Diagnosis not present

## 2018-06-27 DIAGNOSIS — E785 Hyperlipidemia, unspecified: Secondary | ICD-10-CM | POA: Diagnosis not present

## 2018-06-27 DIAGNOSIS — F411 Generalized anxiety disorder: Secondary | ICD-10-CM

## 2018-06-27 DIAGNOSIS — I4819 Other persistent atrial fibrillation: Secondary | ICD-10-CM

## 2018-06-27 DIAGNOSIS — E7800 Pure hypercholesterolemia, unspecified: Secondary | ICD-10-CM

## 2018-06-27 LAB — CMP14+EGFR
ALT: 37 IU/L — ABNORMAL HIGH (ref 0–32)
AST: 64 IU/L — ABNORMAL HIGH (ref 0–40)
Albumin/Globulin Ratio: 1.3 (ref 1.2–2.2)
Albumin: 4 g/dL (ref 3.8–4.8)
Alkaline Phosphatase: 59 IU/L (ref 39–117)
BUN/Creatinine Ratio: 19 (ref 12–28)
BUN: 13 mg/dL (ref 8–27)
Bilirubin Total: 0.8 mg/dL (ref 0.0–1.2)
CO2: 24 mmol/L (ref 20–29)
Calcium: 9.3 mg/dL (ref 8.7–10.3)
Chloride: 97 mmol/L (ref 96–106)
Creatinine, Ser: 0.67 mg/dL (ref 0.57–1.00)
GFR calc Af Amer: 106 mL/min/{1.73_m2} (ref >59)
GFR calc non Af Amer: 92 mL/min/{1.73_m2} (ref >59)
Globulin, Total: 3.1 g/dL (ref 1.5–4.5)
Glucose: 299 mg/dL — ABNORMAL HIGH (ref 65–99)
Potassium: 4.2 mmol/L (ref 3.5–5.2)
Sodium: 136 mmol/L (ref 134–144)
Total Protein: 7.1 g/dL (ref 6.0–8.5)

## 2018-06-27 LAB — LIPID PANEL
Chol/HDL Ratio: 3.3 ratio (ref 0.0–4.4)
Cholesterol, Total: 123 mg/dL (ref 100–199)
HDL: 37 mg/dL — ABNORMAL LOW (ref >39)
LDL Calculated: 46 mg/dL (ref 0–99)
Triglycerides: 200 mg/dL — ABNORMAL HIGH (ref 0–149)
VLDL Cholesterol Cal: 40 mg/dL (ref 5–40)

## 2018-06-27 LAB — BAYER DCA HB A1C WAIVED: HB A1C (BAYER DCA - WAIVED): 8.2 % — ABNORMAL HIGH (ref <7.0)

## 2018-06-27 MED ORDER — ALPRAZOLAM 0.25 MG PO TABS: 0.2500 mg | ORAL_TABLET | Freq: Every day | ORAL | 2 refills | Status: DC | PRN

## 2018-06-27 MED ORDER — METOPROLOL TARTRATE 50 MG PO TABS: ORAL_TABLET | ORAL | 0 refills | Status: DC

## 2018-06-27 MED ORDER — FUROSEMIDE 20 MG PO TABS: 20.0000 mg | ORAL_TABLET | Freq: Every day | ORAL | 1 refills | Status: DC

## 2018-06-27 MED ORDER — QUINAPRIL HCL 40 MG PO TABS: ORAL_TABLET | ORAL | 0 refills | Status: DC

## 2018-06-27 MED ORDER — SITAGLIPTIN PHOSPHATE 100 MG PO TABS: 100.0000 mg | ORAL_TABLET | Freq: Every day | ORAL | 0 refills | Status: DC

## 2018-06-27 MED ORDER — METFORMIN HCL 1000 MG PO TABS: ORAL_TABLET | ORAL | 0 refills | Status: DC

## 2018-06-27 MED ORDER — CLONIDINE HCL 0.2 MG PO TABS: 0.2000 mg | ORAL_TABLET | Freq: Two times a day (BID) | ORAL | 1 refills | Status: DC

## 2018-06-27 MED ORDER — SIMVASTATIN 40 MG PO TABS: ORAL_TABLET | ORAL | 1 refills | Status: DC

## 2018-06-27 MED ORDER — AMLODIPINE BESYLATE 5 MG PO TABS: 5.0000 mg | ORAL_TABLET | Freq: Every day | ORAL | 1 refills | Status: DC

## 2018-06-27 NOTE — Telephone Encounter (Signed)
Januvia is too expensive Please change to cheaper RX

## 2018-06-27 NOTE — Progress Notes (Signed)
Subjective:    Patient ID: Ashley [name], female    DOB: 1950/09/20, 68 y.o.   MRN: [id number]   Chief Complaint: Medical Management of Chronic Issues    HPI:  1. Type 2 diabetes mellitus without complication, without long-term current use of insulin (HCC) Last hgba1c was 7.8%. she does not check blood sugars at home. Denies any symptoms of low blood sugars.  2. Hyperlipidemia, unspecified hyperlipidemia type Does not watch diet and does no exercise  3. Essential hypertension No c/o chest pain, sob or headache. Does not check blood pressure at home  4. Persistent atrial fibrillation Is on eliquis with no side effects and no bleeding  5. Peripheral edema Has usually by the end of day , but resolves at noght  6. Severe obesity (BMI >= 40) (HCC) No recent weight changes    Outpatient Encounter Medications as of 06/27/2018  Medication Sig  . amLODipine (NORVASC) 5 MG tablet Take 5 mg by mouth daily.  . Blood Glucose Monitoring Suppl (ACCU-CHEK AVIVA PLUS) w/Device KIT USE AS DIRECTED  . Blood Glucose Monitoring Suppl (ACCU-CHEK GUIDE ME) w/Device KIT 1 kit by Does not apply route 2 (two) times daily.  Marland Kitchen CINNAMON PO Take 1 capsule by mouth daily.   . cloNIDine (CATAPRES) 0.2 MG tablet TAKE 1 TABLET TWICE DAILY  . clotrimazole-betamethasone (LOTRISONE) cream Apply 1 application topically 2 (two) times daily.  Marland Kitchen ELIQUIS 5 MG TABS tablet TAKE 1 TABLET TWICE DAILY  . furosemide (LASIX) 20 MG tablet Take 20 mg by mouth.  . furosemide (LASIX) 40 MG tablet TAKE 1 TABLET EVERY MORNING  . Ginger, Zingiber officinalis, (GINGER ROOT PO) Take 1 capsule by mouth daily.   Marland Kitchen glucose blood (ACCU-CHEK AVIVA) test strip Use to check BS BID  . metFORMIN (GLUCOPHAGE) 1000 MG tablet Needs to be seen for future refills  . metoprolol tartrate (LOPRESSOR) 50 MG tablet Needs to be seen for further refills  . nystatin cream (MYCOSTATIN) Apply 1 application topically 2 (two) times daily.  . quinapril  (ACCUPRIL) 40 MG tablet Needs to be seen for further refills  . simvastatin (ZOCOR) 40 MG tablet TAKE 1 TABLET EVERY DAY IN THE EVENING  . Turmeric Curcumin 500 MG CAPS Take 500 mg by mouth daily.   Marland Kitchen ACCU-CHEK SOFTCLIX LANCETS lancets Test BS BID  . acetaminophen (TYLENOL) 500 MG tablet Take 1,000 mg by mouth every 6 (six) hours as needed (for pain/headaches.).  Marland Kitchen Alcohol Swabs (B-D SINGLE USE SWABS REGULAR) PADS Use to check BS BID  . ALPRAZolam (XANAX) 0.25 MG tablet Take 1 tablet (0.25 mg total) by mouth daily as needed for anxiety.  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  HENT: Negative.   Eyes: Negative for pain.  Respiratory: Negative for shortness of breath.   Cardiovascular: Negative for chest pain, palpitations and leg swelling.  Gastrointestinal: Negative for abdominal pain.  Endocrine: Negative for polydipsia.  Genitourinary: Negative.   Skin: Negative for rash.  Neurological: Negative for dizziness, weakness and headaches.  Hematological: Does not bruise/bleed easily.  Psychiatric/Behavioral: Negative.   All other systems reviewed and are negative.      Objective:   Physical Exam Vitals signs and nursing note reviewed.  Constitutional:      General: She is not in acute distress.    Appearance: Normal appearance. She is well-developed.  HENT:     Head: Normocephalic.     Nose: Nose normal.  Eyes:     Pupils: Pupils are equal, round, and reactive to light.  Neck:     Musculoskeletal: Normal range of motion and neck supple.     Vascular: No carotid bruit or JVD.  Cardiovascular:     Rate and Rhythm: Normal rate and regular rhythm.     Heart sounds: Normal heart sounds.  Pulmonary:     Effort: Pulmonary effort is normal. No  respiratory distress.     Breath sounds: Normal breath sounds. No wheezing or rales.  Chest:     Chest wall: No tenderness.  Abdominal:     General: Bowel sounds are normal. There is no distension or abdominal bruit.     Palpations: Abdomen is soft. There is no hepatomegaly, splenomegaly, mass or pulsatile mass.     Tenderness: There is no abdominal tenderness.  Musculoskeletal: Normal range of motion.     Left lower leg: Edema (1+) present.  Lymphadenopathy:     Cervical: No cervical adenopathy.  Skin:    General: Skin is warm and dry.  Neurological:     Mental Status: She is alert and oriented to person, place, and time.     Deep Tendon Reflexes: Reflexes are normal and symmetric.  Psychiatric:        Behavior: Behavior normal.        Thought Content: Thought content normal.        Judgment: Judgment normal.    BP 126/74   Pulse 70   Temp (!) 97.2 F (36.2 C) (Oral)   Ht _0  (1.575 m)   Wt 263 lb (119.3 kg)   BMI 48.10 kg/m    hgba1c 8.2%     Assessment & Plan:  Ashley [name] comes in today with chief complaint of Medical Management of Chronic Issues   Diagnosis and orders addressed:  1. Type 2 diabetes mellitus without complication, without long-term current use of insulin (HCC) Added januvia 176m 1 daily-hope she will not have to stay on this Strict low carb diet Check fasting blood sugar every morning and keep a diary to bring to next appointmnet - Bayer DCA Hb A1c Waived - Microalbumin / creatinine urine ratio - metFORMIN (GLUCOPHAGE) 1000 MG tablet; Needs to be seen for future refills  Dispense: 60 tablet; Refill: 0 - sitaGLIPtin (JANUVIA) 100 MG tablet; Take 1 tablet (100 mg total) by mouth daily.  Dispense: 90 tablet; Refill: 0  2. Hyperlipidemia, unspecified hyperlipidemia type Low fat diet - Lipid panel  3. Essential hypertension Low sodium diet - CMP14+EGFR - cloNIDine (CATAPRES) 0.2 MG tablet; Take 1 tablet (0.2 mg total) by mouth 2 (two) times  daily.  Dispense: 180 tablet; Refill: 1 - quinapril (ACCUPRIL) 40 MG tablet; Needs to be seen for further refills  Dispense: 30 tablet; Refill: 0 - amLODipine (NORVASC) 5 MG tablet; Take 1 tablet (5 mg total) by mouth daily.  Dispense: 90 tablet; Refill: 1  4. Persistent atrial fibrillation - metoprolol tartrate (LOPRESSOR) 50 MG tablet; Needs to be seen for further refills  Dispense: 60 tablet; Refill: 0  5. Peripheral edema Elevate legs when sitting - furosemide (LASIX) 20 MG tablet; Take 1 tablet (20 mg total) by mouth daily.  Dispense: 90 tablet; Refill: 1  6. Severe obesity (BMI >= 40) (HCC)  Discussed diet and exercise for person with BMI >25 Will recheck weight in 3-6 months  7. GAD (generalized anxiety disorder) Stress management - ALPRAZolam (XANAX) 0.25 MG tablet; Take 1 tablet (0.25 mg total) by mouth daily as needed for anxiety.  Dispense: 30 tablet; Refill: 2  8. Pure hypercholesterolemia - simvastatin (ZOCOR) 40 MG tablet; TAKE 1 TABLET EVERY DAY IN THE EVENING  Dispense: 90 tablet; Refill: 1   Labs pending Health Maintenance reviewed Diet and exercise encouraged  Follow up plan: 1 month   [place], FNP

## 2018-06-27 NOTE — Patient Instructions (Signed)
Diabetes Mellitus and Nutrition, Adult  When you have diabetes (diabetes mellitus), it is very important to have healthy eating habits because your blood sugar (glucose) levels are greatly affected by what you eat and drink. Eating healthy foods in the appropriate amounts, at about the same times every day, can help you:  · Control your blood glucose.  · Lower your risk of heart disease.  · Improve your blood pressure.  · Reach or maintain a healthy weight.  Every person with diabetes is different, and each person has different needs for a meal plan. Your health care provider may recommend that you work with a diet and nutrition specialist (dietitian) to make a meal plan that is best for you. Your meal plan may vary depending on factors such as:  · The calories you need.  · The medicines you take.  · Your weight.  · Your blood glucose, blood pressure, and cholesterol levels.  · Your activity level.  · Other health conditions you have, such as heart or kidney disease.  How do carbohydrates affect me?  Carbohydrates, also called carbs, affect your blood glucose level more than any other type of food. Eating carbs naturally raises the amount of glucose in your blood. Carb counting is a method for keeping track of how many carbs you eat. Counting carbs is important to keep your blood glucose at a healthy level, especially if you use insulin or take certain oral diabetes medicines.  It is important to know how many carbs you can safely have in each meal. This is different for every person. Your dietitian can help you calculate how many carbs you should have at each meal and for each snack.  Foods that contain carbs include:  · Bread, cereal, rice, pasta, and crackers.  · Potatoes and corn.  · Peas, beans, and lentils.  · Milk and yogurt.  · Fruit and juice.  · Desserts, such as cakes, cookies, ice cream, and candy.  How does alcohol affect me?  Alcohol can cause a sudden decrease in blood glucose (hypoglycemia),  especially if you use insulin or take certain oral diabetes medicines. Hypoglycemia can be a life-threatening condition. Symptoms of hypoglycemia (sleepiness, dizziness, and confusion) are similar to symptoms of having too much alcohol.  If your health care provider says that alcohol is safe for you, follow these guidelines:  · Limit alcohol intake to no more than 1 drink per day for nonpregnant women and 2 drinks per day for men. One drink equals 12 oz of beer, 5 oz of wine, or 1½ oz of hard liquor.  · Do not drink on an empty stomach.  · Keep yourself hydrated with water, diet soda, or unsweetened iced tea.  · Keep in mind that regular soda, juice, and other mixers may contain a lot of sugar and must be counted as carbs.  What are tips for following this plan?    Reading food labels  · Start by checking the serving size on the "Nutrition Facts" label of packaged foods and drinks. The amount of calories, carbs, fats, and other nutrients listed on the label is based on one serving of the item. Many items contain more than one serving per package.  · Check the total grams (g) of carbs in one serving. You can calculate the number of servings of carbs in one serving by dividing the total carbs by 15. For example, if a food has 30 g of total carbs, it would be equal to 2   servings of carbs.  · Check the number of grams (g) of saturated and trans fats in one serving. Choose foods that have low or no amount of these fats.  · Check the number of milligrams (mg) of salt (sodium) in one serving. Most people should limit total sodium intake to less than 2,300 mg per day.  · Always check the nutrition information of foods labeled as "low-fat" or "nonfat". These foods may be higher in added sugar or refined carbs and should be avoided.  · Talk to your dietitian to identify your daily goals for nutrients listed on the label.  Shopping  · Avoid buying canned, premade, or processed foods. These foods tend to be high in fat, sodium,  and added sugar.  · Shop around the outside edge of the grocery store. This includes fresh fruits and vegetables, bulk grains, fresh meats, and fresh dairy.  Cooking  · Use low-heat cooking methods, such as baking, instead of high-heat cooking methods like deep frying.  · Cook using healthy oils, such as olive, canola, or sunflower oil.  · Avoid cooking with butter, cream, or high-fat meats.  Meal planning  · Eat meals and snacks regularly, preferably at the same times every day. Avoid going long periods of time without eating.  · Eat foods high in fiber, such as fresh fruits, vegetables, beans, and whole grains. Talk to your dietitian about how many servings of carbs you can eat at each meal.  · Eat 4-6 ounces (oz) of lean protein each day, such as lean meat, chicken, fish, eggs, or tofu. One oz of lean protein is equal to:  ? 1 oz of meat, chicken, or fish.  ? 1 egg.  ? ¼ cup of tofu.  · Eat some foods each day that contain healthy fats, such as avocado, nuts, seeds, and fish.  Lifestyle  · Check your blood glucose regularly.  · Exercise regularly as told by your health care provider. This may include:  ? 150 minutes of moderate-intensity or vigorous-intensity exercise each week. This could be brisk walking, biking, or water aerobics.  ? Stretching and doing strength exercises, such as yoga or weightlifting, at least 2 times a week.  · Take medicines as told by your health care provider.  · Do not use any products that contain nicotine or tobacco, such as cigarettes and e-cigarettes. If you need help quitting, ask your health care provider.  · Work with a counselor or diabetes educator to identify strategies to manage stress and any emotional and social challenges.  Questions to ask a health care provider  · Do I need to meet with a diabetes educator?  · Do I need to meet with a dietitian?  · What number can I call if I have questions?  · When are the best times to check my blood glucose?  Where to find more  information:  · American Diabetes Association: diabetes.org  · Academy of Nutrition and Dietetics: www.eatright.org  · National Institute of Diabetes and Digestive and Kidney Diseases (NIH): www.niddk.nih.gov  Summary  · A healthy meal plan will help you control your blood glucose and maintain a healthy lifestyle.  · Working with a diet and nutrition specialist (dietitian) can help you make a meal plan that is best for you.  · Keep in mind that carbohydrates (carbs) and alcohol have immediate effects on your blood glucose levels. It is important to count carbs and to use alcohol carefully.  This information is not intended to   replace advice given to you by your health care provider. Make sure you discuss any questions you have with your health care provider.  Document Released: 10/21/2004 Document Revised: 08/24/2016 Document Reviewed: 02/29/2016  Elsevier Interactive Patient Education © 2019 Elsevier Inc.

## 2018-06-28 ENCOUNTER — Telehealth: Payer: Self-pay | Admitting: *Deleted

## 2018-06-28 DIAGNOSIS — I10 Essential (primary) hypertension: Secondary | ICD-10-CM

## 2018-06-28 DIAGNOSIS — I4819 Other persistent atrial fibrillation: Secondary | ICD-10-CM

## 2018-06-28 DIAGNOSIS — E119 Type 2 diabetes mellitus without complications: Secondary | ICD-10-CM

## 2018-06-28 LAB — MICROALBUMIN / CREATININE URINE RATIO
Creatinine, Urine: 133.8 mg/dL
Microalb/Creat Ratio: 7 mg/g creat (ref 0–29)
Microalbumin, Urine: 9.4 ug/mL

## 2018-06-28 MED ORDER — QUINAPRIL HCL 40 MG PO TABS: 40.0000 mg | ORAL_TABLET | Freq: Every day | ORAL | 1 refills | Status: DC

## 2018-06-28 MED ORDER — METOPROLOL TARTRATE 50 MG PO TABS: 50.0000 mg | ORAL_TABLET | Freq: Two times a day (BID) | ORAL | 1 refills | Status: DC

## 2018-06-28 MED ORDER — METFORMIN HCL 1000 MG PO TABS: 1000.0000 mg | ORAL_TABLET | Freq: Two times a day (BID) | ORAL | 0 refills | Status: DC

## 2018-06-28 NOTE — Telephone Encounter (Signed)
I need  To know what her insurance will cover before I can change

## 2018-06-28 NOTE — Telephone Encounter (Signed)
Sig updated & Rxs resent

## 2018-06-28 NOTE — Telephone Encounter (Signed)
Pt will contact insurance and call us back -jhb

## 2018-06-29 ENCOUNTER — Other Ambulatory Visit: Payer: Self-pay

## 2018-06-29 DIAGNOSIS — I4891 Unspecified atrial fibrillation: Secondary | ICD-10-CM

## 2018-06-29 DIAGNOSIS — R609 Edema, unspecified: Secondary | ICD-10-CM

## 2018-06-29 DIAGNOSIS — E7800 Pure hypercholesterolemia, unspecified: Secondary | ICD-10-CM

## 2018-06-29 DIAGNOSIS — R600 Localized edema: Secondary | ICD-10-CM

## 2018-06-29 DIAGNOSIS — E119 Type 2 diabetes mellitus without complications: Secondary | ICD-10-CM

## 2018-06-29 DIAGNOSIS — I4819 Other persistent atrial fibrillation: Secondary | ICD-10-CM

## 2018-06-29 DIAGNOSIS — I10 Essential (primary) hypertension: Secondary | ICD-10-CM

## 2018-06-29 MED ORDER — QUINAPRIL HCL 40 MG PO TABS: 40.0000 mg | ORAL_TABLET | Freq: Every day | ORAL | 1 refills | Status: DC

## 2018-06-29 MED ORDER — AMLODIPINE BESYLATE 5 MG PO TABS: 5.0000 mg | ORAL_TABLET | Freq: Every day | ORAL | 1 refills | Status: DC

## 2018-06-29 MED ORDER — METFORMIN HCL 1000 MG PO TABS: 1000.0000 mg | ORAL_TABLET | Freq: Two times a day (BID) | ORAL | 0 refills | Status: DC

## 2018-06-29 MED ORDER — CLONIDINE HCL 0.2 MG PO TABS: 0.2000 mg | ORAL_TABLET | Freq: Two times a day (BID) | ORAL | 1 refills | Status: DC

## 2018-06-29 MED ORDER — SITAGLIPTIN PHOSPHATE 100 MG PO TABS: 100.0000 mg | ORAL_TABLET | Freq: Every day | ORAL | 0 refills | Status: DC

## 2018-06-29 MED ORDER — METOPROLOL TARTRATE 50 MG PO TABS: 50.0000 mg | ORAL_TABLET | Freq: Two times a day (BID) | ORAL | 1 refills | Status: DC

## 2018-06-29 MED ORDER — FUROSEMIDE 20 MG PO TABS: 20.0000 mg | ORAL_TABLET | Freq: Every day | ORAL | 1 refills | Status: DC

## 2018-06-29 MED ORDER — SIMVASTATIN 40 MG PO TABS: ORAL_TABLET | ORAL | 1 refills | Status: DC

## 2018-07-10 ENCOUNTER — Other Ambulatory Visit: Payer: Self-pay | Admitting: Nurse Practitioner

## 2018-07-10 DIAGNOSIS — E119 Type 2 diabetes mellitus without complications: Secondary | ICD-10-CM

## 2018-07-10 MED ORDER — GLIPIZIDE 5 MG PO TABS: 5.0000 mg | ORAL_TABLET | Freq: Two times a day (BID) | ORAL | 1 refills | Status: DC

## 2018-07-10 MED ORDER — GLIPIZIDE 5 MG PO TABS: 5.0000 mg | ORAL_TABLET | Freq: Two times a day (BID) | ORAL | 11 refills | Status: DC

## 2018-07-13 IMAGING — DX DG ELBOW 2V*L*
2 series · 2 of 2 positions shown · non-contrast
Comparison: None.

CLINICAL DATA: 66-year-old who fell from the back of a truck three
days ago, injuring the left elbow, persistent pain. Initial
encounter.

EXAM:
LEFT ELBOW - 2 VIEW

[elbow ap]
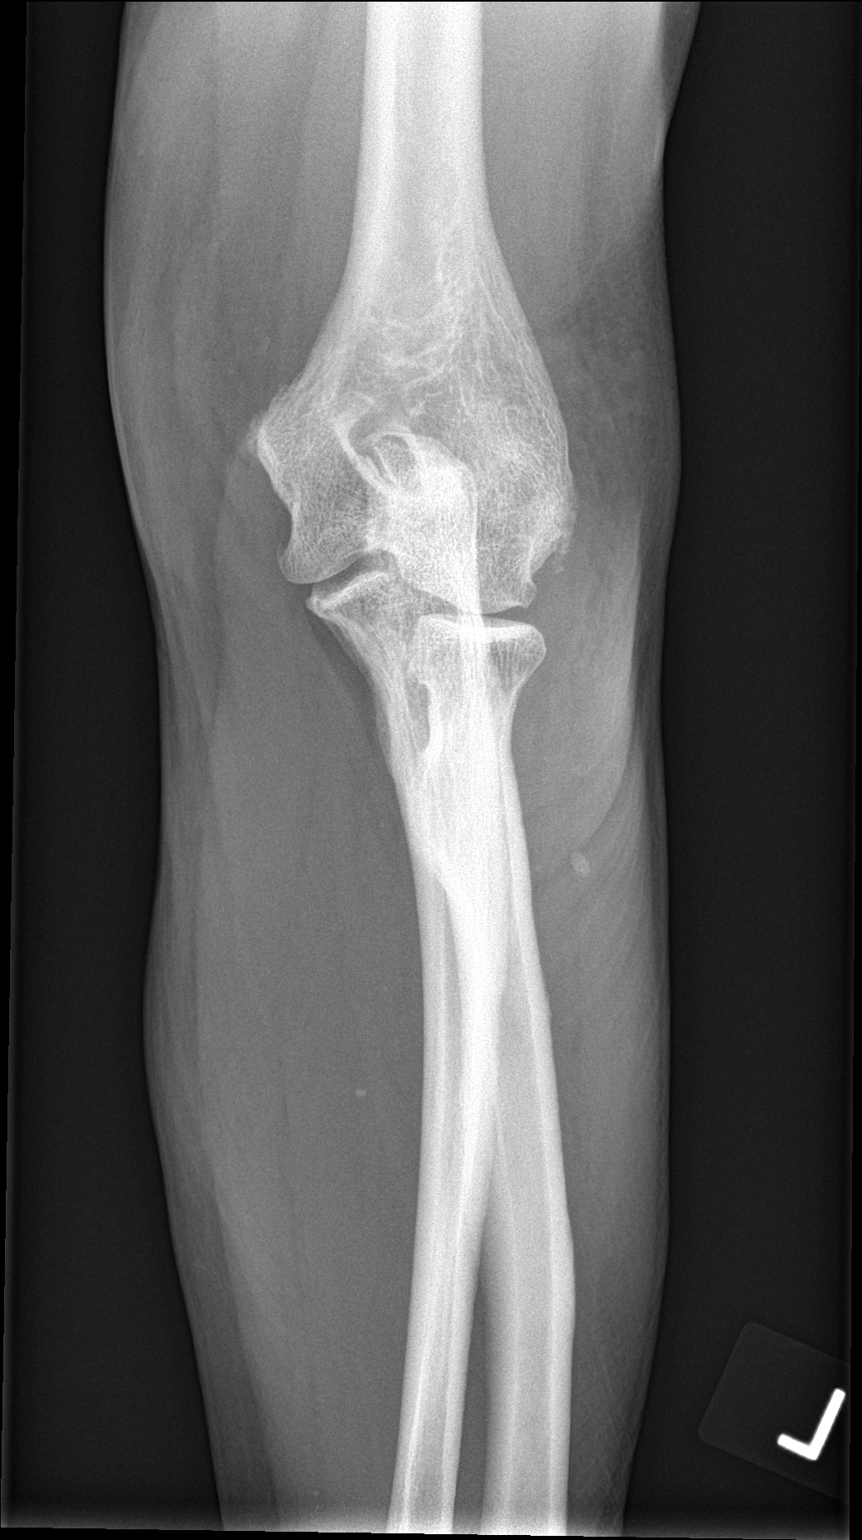

[elbow lat]
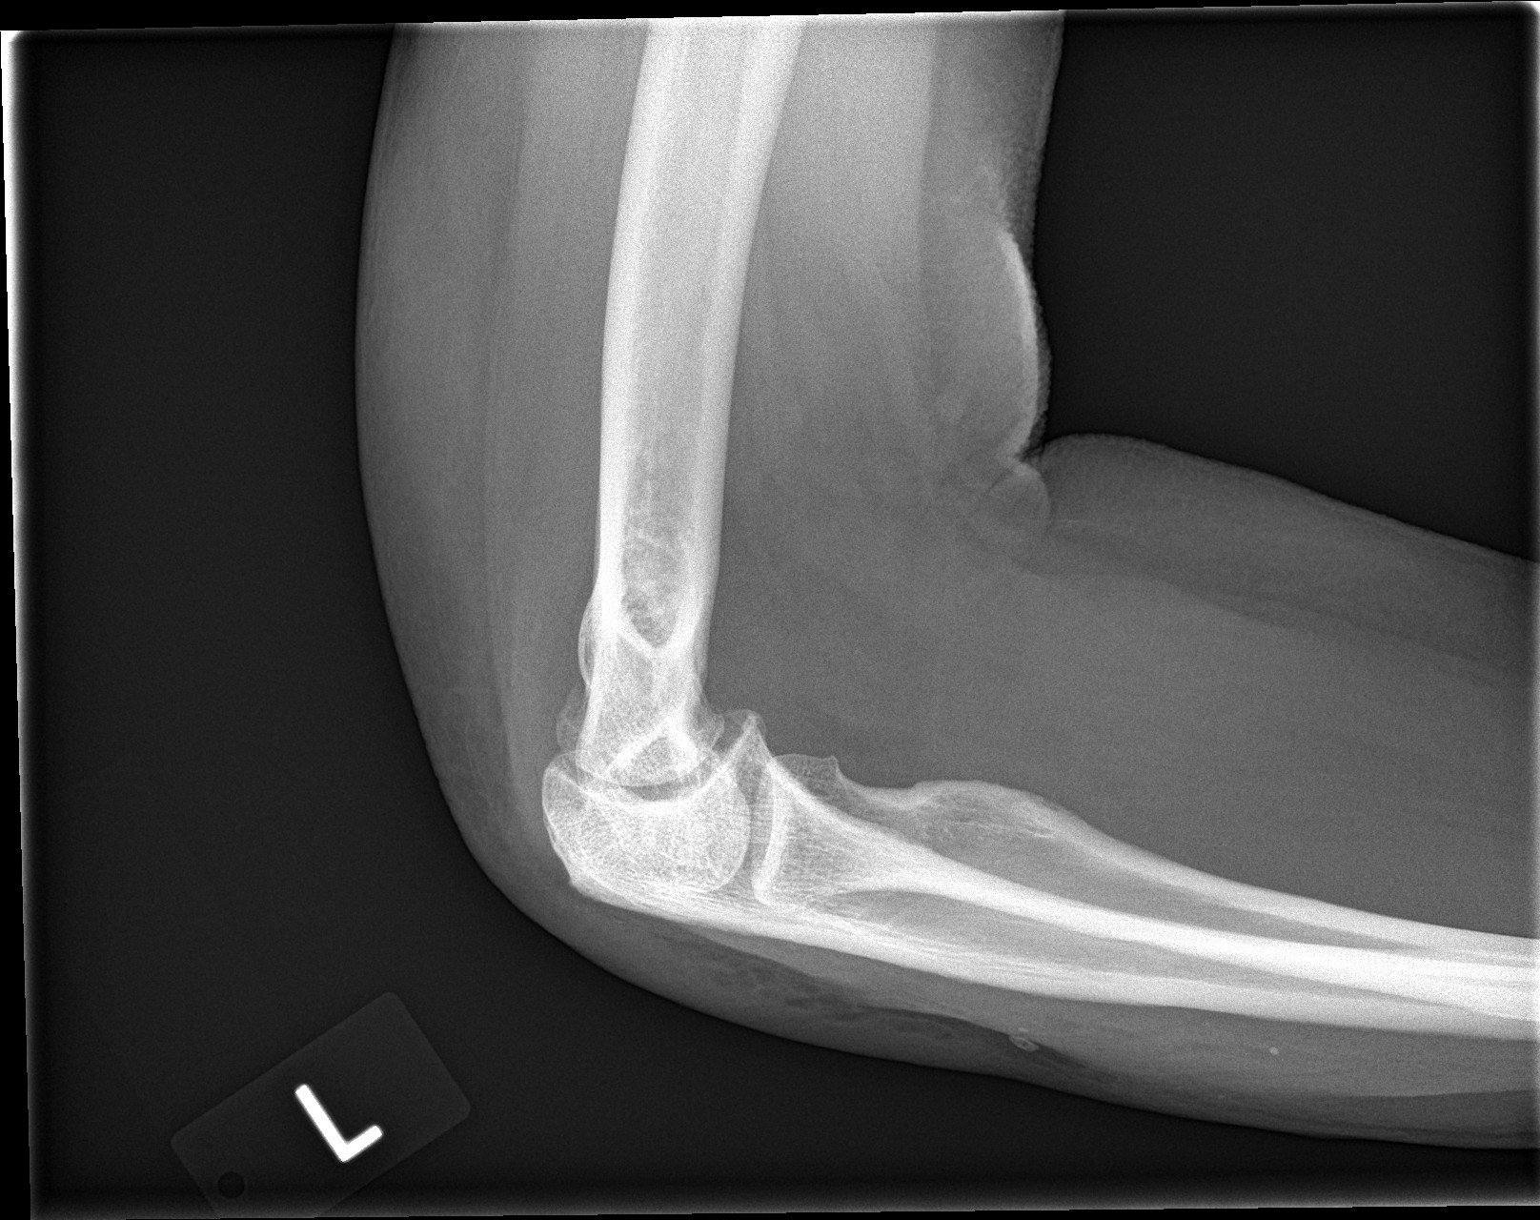

[2 of 2 positions shown; findings below may reference images not displayed]

FINDINGS: No evidence of acute or subacute fracture or dislocation. Narrowing
of the humeroulnar joint space. Bone mineral density well-preserved.
Enthesopathic calcification at musculotendinous insertion sites on
the medial and lateral humeral epicondyles. No posterior fat pad to
confirm a joint effusion or hemarthrosis. Phleboliths in the
subcutaneous tissues of the proximal forearm.
IMPRESSION: No acute or subacute osseous abnormality.  Mild osteoarthritis.

## 2018-07-26 ENCOUNTER — Other Ambulatory Visit: Payer: Self-pay | Admitting: Nurse Practitioner

## 2018-07-26 DIAGNOSIS — F411 Generalized anxiety disorder: Secondary | ICD-10-CM

## 2018-07-30 ENCOUNTER — Other Ambulatory Visit: Payer: Self-pay | Admitting: Nurse Practitioner

## 2018-07-30 DIAGNOSIS — F411 Generalized anxiety disorder: Secondary | ICD-10-CM

## 2018-07-31 ENCOUNTER — Other Ambulatory Visit: Payer: Self-pay | Admitting: Nurse Practitioner

## 2018-07-31 DIAGNOSIS — F411 Generalized anxiety disorder: Secondary | ICD-10-CM

## 2018-07-31 MED ORDER — ALPRAZOLAM 0.25 MG PO TABS: 0.2500 mg | ORAL_TABLET | Freq: Every day | ORAL | 2 refills | Status: DC | PRN

## 2018-08-02 ENCOUNTER — Other Ambulatory Visit: Payer: Self-pay

## 2018-08-02 ENCOUNTER — Ambulatory Visit (INDEPENDENT_AMBULATORY_CARE_PROVIDER_SITE_OTHER): Payer: Medicare HMO | Admitting: Nurse Practitioner

## 2018-08-02 ENCOUNTER — Encounter: Payer: Self-pay | Admitting: Nurse Practitioner

## 2018-08-02 DIAGNOSIS — E785 Hyperlipidemia, unspecified: Secondary | ICD-10-CM

## 2018-08-02 DIAGNOSIS — R600 Localized edema: Secondary | ICD-10-CM

## 2018-08-02 DIAGNOSIS — I4819 Other persistent atrial fibrillation: Secondary | ICD-10-CM | POA: Diagnosis not present

## 2018-08-02 DIAGNOSIS — R609 Edema, unspecified: Secondary | ICD-10-CM

## 2018-08-02 DIAGNOSIS — I10 Essential (primary) hypertension: Secondary | ICD-10-CM

## 2018-08-02 DIAGNOSIS — E119 Type 2 diabetes mellitus without complications: Secondary | ICD-10-CM

## 2018-08-02 NOTE — Progress Notes (Signed)
Virtual Visit via telephone Note  I connected with Ashley [name] on 08/02/18 at 3:30 by telephone and verified that I am speaking with the correct person using two identifiers. Ashley [name] is currently located at home and her grandchildren is currently with her during visit. The provider, Mary-Margaret Hassell Done, FNP is located in their office at time of visit.  I discussed the limitations, risks, security and privacy concerns of performing an evaluation and management service by telephone and the availability of in person appointments. I also discussed with the patient that there may be a patient responsible charge related to this service. The patient expressed understanding and agreed to proceed.   History and Present Illness:   Chief Complaint: Medical Management of Chronic Issues    HPI:  1. Essential hypertension No c/o chest pain, sob or headache. Does not check blood pressure at home. BP Readings from Last 3 Encounters:  06/27/18 126/74  04/13/18 136/76  12/28/17 (!) 156/88     2. Type 2 diabetes mellitus without complication, without long-term current use of insulin (HCC) Last hgba1c was 8.2%. she does  check her blood at home. Running between 120-150since starting glipizide. She has not been watching diet  3. Hyperlipidemia, unspecified hyperlipidemia type Not watching diet and does very little exercise  4. Persistent atrial fibrillation denies palpitations  5. Peripheral edema Has daily swelling- resolves some if she elevates her legs  6. Severe obesity (BMI >= 40) (HCC) No recent weight changes    Outpatient Encounter Medications as of 08/02/2018  Medication Sig  . ACCU-CHEK SOFTCLIX LANCETS lancets Test BS BID  . acetaminophen (TYLENOL) 500 MG tablet Take 1,000 mg by mouth every 6 (six) hours as needed (for pain/headaches.).  Marland Kitchen Alcohol Swabs (B-D SINGLE USE SWABS REGULAR) PADS Use to check BS BID  . ALPRAZolam (XANAX) 0.25 MG tablet Take 1 tablet (0.25 mg  total) by mouth daily as needed for anxiety.  Marland Kitchen amLODipine (NORVASC) 5 MG tablet Take 1 tablet (5 mg total) by mouth daily.  . Blood Glucose Monitoring Suppl (ACCU-CHEK AVIVA PLUS) w/Device KIT USE AS DIRECTED  . Blood Glucose Monitoring Suppl (ACCU-CHEK GUIDE ME) w/Device KIT 1 kit by Does not apply route 2 (two) times daily.  Marland Kitchen CINNAMON PO Take 1 capsule by mouth daily.   . cloNIDine (CATAPRES) 0.2 MG tablet Take 1 tablet (0.2 mg total) by mouth 2 (two) times daily.  . clotrimazole-betamethasone (LOTRISONE) cream Apply 1 application topically 2 (two) times daily.  Marland Kitchen ELIQUIS 5 MG TABS tablet TAKE 1 TABLET TWICE DAILY  . furosemide (LASIX) 20 MG tablet Take 1 tablet (20 mg total) by mouth daily.  . Ginger, Zingiber officinalis, (GINGER ROOT PO) Take 1 capsule by mouth daily.   Marland Kitchen glipiZIDE (GLUCOTROL) 5 MG tablet Take 1 tablet (5 mg total) by mouth 2 (two) times daily.  Marland Kitchen glucose blood (ACCU-CHEK AVIVA) test strip Use to check BS BID  . metFORMIN (GLUCOPHAGE) 1000 MG tablet Take 1 tablet (1,000 mg total) by mouth 2 (two) times daily with a meal.  . metoprolol tartrate (LOPRESSOR) 50 MG tablet Take 1 tablet (50 mg total) by mouth 2 (two) times daily.  Marland Kitchen nystatin cream (MYCOSTATIN) Apply 1 application topically 2 (two) times daily.  . quinapril (ACCUPRIL) 40 MG tablet Take 1 tablet (40 mg total) by mouth daily.  . simvastatin (ZOCOR) 40 MG tablet TAKE 1 TABLET EVERY DAY IN THE EVENING  . Turmeric Curcumin 500 MG CAPS Take 500 mg by mouth daily.  Past Surgical History:  Procedure Laterality Date  . CYST EXCISION Left 03/15/2017   from buttocks  . ORIF ANKLE FRACTURE Left 04/20/2017   Procedure: OPEN REDUCTION INTERNAL FIXATION (ORIF) LEFT ANKLE FRACTURE;  Surgeon: Netta Cedars, MD;  Location: [facility];  Service: Orthopedics;  Laterality: Left;    Family History  Problem Relation Age of Onset  . Diabetes Mother   . CAD Mother        CABG at age 25  . Hypertension Mother   . Diabetes  Father   . Heart disease Father        CHF  . Diabetes Sister   . CAD Sister   . Hypertension Sister   . Diabetes Brother   . Hypertension Brother   . Healthy Sister     New complaints: None today  Social history: Is keeping her grandchildren for the first time in several years.     Review of Systems  Constitutional: Negative for diaphoresis and weight loss.  Eyes: Negative for blurred vision, double vision and pain.  Respiratory: Negative for shortness of breath.   Cardiovascular: Negative for chest pain, palpitations, orthopnea and leg swelling.  Gastrointestinal: Negative for abdominal pain.  Skin: Negative for rash.  Neurological: Negative for dizziness, sensory change, loss of consciousness, weakness and headaches.  Endo/Heme/Allergies: Negative for polydipsia. Does not bruise/bleed easily.  Psychiatric/Behavioral: Negative for memory loss. The patient does not have insomnia.   All other systems reviewed and are negative.    Observations/Objective: Alert and oriented- answers all questions appropriately No distress  Assessment and Plan: Ashley [name] comes in today with chief complaint of Medical Management of Chronic Issues   Diagnosis and orders addressed:  1. Essential hypertension Low sodium diet  2. Type 2 diabetes mellitus without complication, without long-term current use of insulin (HCC) Watch carbs in diet  3. Hyperlipidemia, unspecified hyperlipidemia type Low fat diet  4. Persistent atrial fibrillation Avoid caffeine  5. Peripheral edema elevate legs when sitting  6. Severe obesity (BMI >= 40) (HCC) Discussed diet and exercise for person with BMI >25 Will recheck weight in 3-6 months    Previous lab result sreviewed Health Maintenance reviewed Diet and exercise encouraged  Follow up plan: 3 months      I discussed the assessment and treatment plan with the patient. The patient was provided an opportunity to ask questions and  all were answered. The patient agreed with the plan and demonstrated an understanding of the instructions.   The patient was advised to call back or seek an in-person evaluation if the symptoms worsen or if the condition fails to improve as anticipated.  The above assessment and management plan was discussed with the patient. The patient verbalized understanding of and has agreed to the management plan. Patient is aware to call the clinic if symptoms persist or worsen. Patient is aware when to return to the clinic for a follow-up visit. Patient educated on when it is appropriate to go to the emergency department.   Time call ended:  3:45  I provided 15 minutes of non-face-to-face time during this encounter.    Mary-Margaret Hassell Done, FNP

## 2018-08-13 ENCOUNTER — Ambulatory Visit: Payer: Self-pay | Admitting: Nurse Practitioner

## 2018-08-30 ENCOUNTER — Telehealth: Payer: Self-pay | Admitting: Nurse Practitioner

## 2018-08-30 NOTE — Chronic Care Management (AMB) (Signed)
°  Chronic Care Management   Outreach Note  08/30/2018 Name: Ashley [name] Toft MRN: [id number] DOB: 1950/11/03  Referred by: Chevis Pretty, FNP Reason for referral : Chronic Care Management (Initial CCM outreach was unsuccessful. )   An unsuccessful telephone outreach was attempted today. The patient was referred to the case management team by for assistance with chronic care management and care coordination.   Follow Up Plan: A HIPPA compliant phone message was left for the patient providing contact information and requesting a return call.  The care management team will reach out to the patient again over the next 7 days.  If patient returns call to provider office, please advise to call Gordon at [place]  [email or web address]   ??[phone]

## 2018-08-30 NOTE — Chronic Care Management (AMB) (Signed)
Chronic Care Management   Note  08/30/2018 Name: Nakenya Theall MRN: [id number] DOB: 11/13/1950  Hamna Asa is a 68 y.o. year old female who is a primary care patient of Chevis Pretty, Rudy. I reached out to [name] by phone today in response to a referral sent by Ms. Cataleia Recupero's health plan.    Ms. Penning was given information about Chronic Care Management services today including:  1. CCM service includes personalized support from designated clinical staff supervised by her physician, including individualized plan of care and coordination with other care providers 2. 24/7 contact phone numbers for assistance for urgent and routine care needs. 3. Service will only be billed when office clinical staff spend 20 minutes or more in a month to coordinate care. 4. Only one practitioner may furnish and bill the service in a calendar month. 5. The patient may stop CCM services at any time (effective at the end of the month) by phone call to the office staff. 6. The patient will be responsible for cost sharing (co-pay) of up to 20% of the service fee (after annual deductible is met).  Patient did not agree to enrollment in care management services and does not wish to consider at this time.  Follow up plan: The patient has been provided with contact information for the chronic care management team and has been advised to call with any health related questions or concerns.   [place]  [email or web address]   ??[phone]

## 2018-09-05 ENCOUNTER — Other Ambulatory Visit: Payer: Self-pay | Admitting: Nurse Practitioner

## 2018-09-05 DIAGNOSIS — E119 Type 2 diabetes mellitus without complications: Secondary | ICD-10-CM

## 2018-09-14 DIAGNOSIS — H5213 Myopia, bilateral: Secondary | ICD-10-CM | POA: Diagnosis not present

## 2018-09-14 DIAGNOSIS — H40033 Anatomical narrow angle, bilateral: Secondary | ICD-10-CM | POA: Diagnosis not present

## 2018-09-14 LAB — HM DIABETES EYE EXAM

## 2018-10-25 ENCOUNTER — Telehealth (INDEPENDENT_AMBULATORY_CARE_PROVIDER_SITE_OTHER): Payer: Medicare HMO | Admitting: Cardiology

## 2018-10-25 ENCOUNTER — Telehealth: Payer: Self-pay | Admitting: Cardiology

## 2018-10-25 ENCOUNTER — Encounter: Payer: Self-pay | Admitting: Cardiology

## 2018-10-25 VITALS — BP 157/65 | HR 68 | Ht 62.5 in | Wt 245.0 lb

## 2018-10-25 DIAGNOSIS — E782 Mixed hyperlipidemia: Secondary | ICD-10-CM

## 2018-10-25 DIAGNOSIS — I4891 Unspecified atrial fibrillation: Secondary | ICD-10-CM

## 2018-10-25 DIAGNOSIS — I10 Essential (primary) hypertension: Secondary | ICD-10-CM

## 2018-10-25 NOTE — Patient Instructions (Signed)
Your physician wants you to follow-up in: [place] will receive a reminder letter in the mail two months in advance. If you don't receive a letter, please call our office to schedule the follow-up appointment.  Your physician recommends that you continue on your current medications as directed. Please refer to the Current Medication list given to you today.  Your physician has requested that you regularly monitor and record your blood pressure readings at home FOR 1 WEEK AND CALL us WITH READINGS. Please use the same machine at the same time of day to check your readings   Thank you for choosing [facility]!!

## 2018-10-25 NOTE — Progress Notes (Signed)
Virtual Visit via Telephone Note   This visit type was conducted due to national recommendations for restrictions regarding the COVID-19 Pandemic (e.g. social distancing) in an effort to limit this patient's exposure and mitigate transmission in our community.  Due to her co-morbid illnesses, this patient is at least at moderate risk for complications without adequate follow up.  This format is felt to be most appropriate for this patient at this time.  The patient did not have access to video technology/had technical difficulties with video requiring transitioning to audio format only (telephone).  All issues noted in this document were discussed and addressed.  No physical exam could be performed with this format.  Please refer to the patient's chart for her  consent to telehealth for [facility].   Date:  10/25/2018   ID:  Ashley [name], DOB May 01, 1950, MRN [id number]  Patient Location: Home Provider Location: Office  PCP:  Chevis Pretty, FNP  Cardiologist:  Carlyle Dolly, MD  Electrophysiologist:  None   Evaluation Performed:  Follow-Up Visit  Chief Complaint:  Follow up  History of Present Illness:    Ashley [name] is a 68 y.o. female  seen today for follow up of the following medical problems.  1. Afib - has been on eliquis for stroke prevention, CHADS2Vasc score is 4   - denies any palpitations - compliant with meds.    2. HTN - just took meds this AM - compliant with meds    3. Hyperlipidemia 06/2018 TC 123 TG 200 HDL 37 LDL 46 - of note HgbA1c was 8   SH: works as Teacher, early years/pre  The patient does not have symptoms concerning for COVID-19 infection (fever, chills, cough, or new shortness of breath).    Past Medical History:  Diagnosis Date  . Depression   . Diabetes mellitus without complication ([place])   . Hyperlipidemia   . Hypertension    Past Surgical History:  Procedure Laterality Date  . CYST EXCISION Left 03/15/2017   from  buttocks  . ORIF ANKLE FRACTURE Left 04/20/2017   Procedure: OPEN REDUCTION INTERNAL FIXATION (ORIF) LEFT ANKLE FRACTURE;  Surgeon: Netta Cedars, MD;  Location: [place];  Service: Orthopedics;  Laterality: Left;     Current Meds  Medication Sig  . ACCU-CHEK SOFTCLIX LANCETS lancets Test BS BID  . acetaminophen (TYLENOL) 500 MG tablet Take 1,000 mg by mouth every 6 (six) hours as needed (for pain/headaches.).  Marland Kitchen Alcohol Swabs (B-D SINGLE USE SWABS REGULAR) PADS Use to check BS BID  . ALPRAZolam (XANAX) 0.25 MG tablet Take 1 tablet (0.25 mg total) by mouth daily as needed for anxiety.  Marland Kitchen amLODipine (NORVASC) 5 MG tablet Take 1 tablet (5 mg total) by mouth daily.  . Blood Glucose Monitoring Suppl (ACCU-CHEK AVIVA PLUS) w/Device KIT USE AS DIRECTED  . Blood Glucose Monitoring Suppl (ACCU-CHEK GUIDE ME) w/Device KIT 1 kit by Does not apply route 2 (two) times daily.  Marland Kitchen CINNAMON PO Take 1 capsule by mouth daily.   . cloNIDine (CATAPRES) 0.2 MG tablet Take 1 tablet (0.2 mg total) by mouth 2 (two) times daily.  . clotrimazole-betamethasone (LOTRISONE) cream Apply 1 application topically 2 (two) times daily.  Marland Kitchen ELIQUIS 5 MG TABS tablet TAKE 1 TABLET TWICE DAILY  . furosemide (LASIX) 20 MG tablet Take 1 tablet (20 mg total) by mouth daily.  . Ginger, Zingiber officinalis, (GINGER ROOT PO) Take 1 capsule by mouth daily.   Marland Kitchen glipiZIDE (GLUCOTROL) 5 MG tablet Take 1 tablet (  5 mg total) by mouth 2 (two) times daily.  Marland Kitchen glucose blood (ACCU-CHEK AVIVA) test strip Use to check BS BID  . metFORMIN (GLUCOPHAGE) 1000 MG tablet TAKE 1 TABLET TWICE DAILY WITH MEALS  . metoprolol tartrate (LOPRESSOR) 50 MG tablet Take 1 tablet (50 mg total) by mouth 2 (two) times daily.  Marland Kitchen nystatin cream (MYCOSTATIN) Apply 1 application topically 2 (two) times daily.  . quinapril (ACCUPRIL) 40 MG tablet Take 1 tablet (40 mg total) by mouth daily.  . simvastatin (ZOCOR) 40 MG tablet TAKE 1 TABLET EVERY DAY IN THE EVENING  .  Turmeric Curcumin 500 MG CAPS Take 500 mg by mouth daily.      Allergies:   Ciprofloxacin hcl   Social History   Tobacco Use  . Smoking status: Never Smoker  . Smokeless tobacco: Never Used  Substance Use Topics  . Alcohol use: No  . Drug use: No     Family Hx: The patient's family history includes CAD in her mother and sister; Diabetes in her brother, father, mother, and sister; Healthy in her sister; Heart disease in her father; Hypertension in her brother, mother, and sister.  ROS:   Please see the history of present illness.     All other systems reviewed and are negative.   Prior CV studies:   The following studies were reviewed today:  04/2017 echo Study Conclusions  - Left ventricle: The cavity size was normal. Wall thickness was increased in a pattern of mild LVH. Systolic function was normal. The estimated ejection fraction was in the range of 55% to 60%. Indeterminant diastolic function (atrial fibrillation). Wall motion was normal; there were no regional wall motion abnormalities. - Aortic valve: There was trivial regurgitation. - Mitral valve: Mildly calcified annulus. Mildly calcified leaflets . There was no significant regurgitation. - Left atrium: The atrium was mildly dilated. - Right ventricle: The cavity size was normal. Systolic function was normal. - Right atrium: The atrium was mildly dilated. - Tricuspid valve: Peak RV-RA gradient (S): 23 mm Hg. - Pulmonary arteries: PA peak pressure: 26 mm Hg (S). - Inferior vena cava: The vessel was normal in size. The respirophasic diameter changes were in the normal range (= 50%), consistent with normal central venous pressure.  Impressions:  - The patient was in atrial fibrillation. Normal LV size with mild LV hypertrophy. EF 55-60%. Normal RV size and systolic function. No significant valvular abnormalities.   Labs/Other Tests and Data Reviewed:    EKG:  No ECG reviewed.   Recent Labs: 06/27/2018: ALT 37; BUN 13; Creatinine, Ser 0.67; Potassium 4.2; Sodium 136   Recent Lipid Panel Lab Results  Component Value Date/Time   CHOL 123 06/27/2018 10:06 AM   TRIG 200 (H) 06/27/2018 10:06 AM   TRIG 118 11/29/2013 09:07 AM   HDL 37 (L) 06/27/2018 10:06 AM   HDL 45 11/29/2013 09:07 AM   CHOLHDL 3.3 06/27/2018 10:06 AM   LDLCALC 46 06/27/2018 10:06 AM   LDLCALC 66 11/29/2013 09:07 AM    Wt Readings from Last 3 Encounters:  10/25/18 245 lb (111.1 kg)  06/27/18 263 lb (119.3 kg)  04/13/18 270 lb (122.5 kg)     Objective:    Vital Signs:  BP (!) 157/65   Pulse 68   Ht 5' 2.5" (1.588 m)   Wt 245 lb (111.1 kg)   BMI 44.10 kg/m    Normal affect. Normal speech pattern and tone. Comfortable, no apparent distress. No audible signs of SOB or  wheezing.   ASSESSMENT & PLAN:    1. Afib - no significant symptoms, continue current meds including eliquis for stroke prevention  2. HTN - above goal, unclear if due to just taking meds when she checked this AM or consistently elevated - she will call with home bp's next week, room to titrate norvasc if needed  3. Hyperlipidemia - LDL is at [facility], she will continue statin. Counseled on dietary changes to improve TGs, also as her DM2 is better controlled her TGs should improve    F/u 1 year    COVID-19 Education: The signs and symptoms of COVID-19 were discussed with the patient and how to seek care for testing (follow up with PCP or arrange E-visit).  The importance of social distancing was discussed today.  Time:   Today, I have spent 16 minutes with the patient with telehealth technology discussing the above problems.     Medication Adjustments/Labs and Tests Ordered: Current medicines are reviewed at length with the patient today.  Concerns regarding medicines are outlined above.   Tests Ordered: No orders of the defined types were placed in this encounter.   Medication Changes: No orders of the  defined types were placed in this encounter.   Follow Up:  In Person in 1 year(s)  Signed, Carlyle Dolly, MD  10/25/2018 11:07 AM    [place]

## 2018-10-25 NOTE — Telephone Encounter (Signed)
Virtual Visit Pre-Appointment Phone Call  "(Name), I am calling you today to discuss your upcoming appointment. We are currently trying to limit exposure to the virus that causes COVID-19 by seeing patients at home rather than in the office."  1. "What is the BEST phone number to call the day of the visit?" - include this in appointment notes  2. Do you have or have access to (through a family member/friend) a smartphone with video capability that we can use for your visit?" a. If yes - list this number in appt notes as cell (if different from BEST phone #) and list the appointment type as a VIDEO visit in appointment notes b. If no - list the appointment type as a PHONE visit in appointment notes  Confirm consent - "In the setting of the current Covid19 crisis, you are scheduled for a (phone or video) visit with your provider on (date) at (time).  Just as we do with many in-office visits, in order for you to participate in this visit, we must obtain consent.  If you'd like, I can send this to your mychart (if signed up) or email for you to review.  Otherwise, I can obtain your verbal consent now.  All virtual visits are billed to your insurance company just like a normal visit would be.  By agreeing to a virtual visit, we'd like you to understand that the technology does not allow for your provider to perform an examination, and thus may limit your provider's ability to fully assess your condition. If your provider identifies any concerns that need to be evaluated in person, we will make arrangements to do so.  Finally, though the technology is pretty good, we cannot assure that it will always work on either your or our end, and in the setting of a video visit, we may have to convert it to a phone-only visit.  In either situation, we cannot ensure that we have a secure connection.  Are you willing to proceed?" STAFF: Did the patient verbally acknowledge consent to telehealth visit? Document  YES/NO here: yes  3. Advise patient to be prepared - "Two hours prior to your appointment, go ahead and check your blood pressure, pulse, oxygen saturation, and your weight (if you have the equipment to check those) and write them all down. When your visit starts, your provider will ask you for this information. If you have an Apple Watch or Kardia device, please plan to have heart rate information ready on the day of your appointment. Please have a pen and paper handy nearby the day of the visit as well."  4. Give patient instructions for MyChart download to smartphone OR [email or web address] as below if video visit (depending on what platform provider is using)  5. Inform patient they will receive a phone call 15 minutes prior to their appointment time (may be from unknown caller ID) so they should be prepared to answer    TELEPHONE CALL NOTE  Taliyah Bilderback has been deemed a candidate for a follow-up tele-health visit to limit community exposure during the Covid-19 pandemic. I spoke with the patient via phone to ensure availability of phone/video source, confirm preferred email & phone number, and discuss instructions and expectations.  I reminded Nyaisha Causevic to be prepared with any vital sign and/or heart rhythm information that could potentially be obtained via home monitoring, at the time of her visit. I reminded Bryli Ceasar to expect a phone call prior to her visit.  Colletta [place]  [place] 10/25/2018 10:49 AM   INSTRUCTIONS FOR DOWNLOADING THE MYCHART APP TO SMARTPHONE  - The patient must first make sure to have activated MyChart and know their login information - If Apple, go to [employer] and type in MyChart in the search bar and download the app. If Android, ask patient to go to [employer] and type in [place] in the search bar and download the app. The app is free but as with any other app downloads, their phone may require them to verify saved payment information or Apple/Android password.    - The patient will need to then log into the app with their MyChart username and password, and select [facility] as their healthcare provider to link the account. When it is time for your visit, go to the MyChart app, find appointments, and click Begin Video Visit. Be sure to Select Allow for your device to access the Microphone and Camera for your visit. You will then be connected, and your provider will be with you shortly.  **If they have any issues connecting, or need assistance please contact MyChart service desk [phone])**  **If using a computer, in order to ensure the best quality for their visit they will need to use either of the following Internet Browsers: [employer], or Google Chrome**  IF USING DOXIMITY or DOXY.ME - The patient will receive a link just prior to their visit by text.     FULL LENGTH CONSENT FOR TELE-HEALTH VISIT   I hereby voluntarily request, consent and authorize [facility] and its employed or contracted physicians, physician assistants, nurse practitioners or other licensed health care professionals (the Practitioner), to provide me with telemedicine health care services (the Services") as deemed necessary by the treating Practitioner. I acknowledge and consent to receive the Services by the Practitioner via telemedicine. I understand that the telemedicine visit will involve communicating with the Practitioner through live audiovisual communication technology and the disclosure of certain medical information by electronic transmission. I acknowledge that I have been given the opportunity to request an in-person assessment or other available alternative prior to the telemedicine visit and am voluntarily participating in the telemedicine visit.  I understand that I have the right to withhold or withdraw my consent to the use of telemedicine in the course of my care at any time, without affecting my right to future care or treatment, and that  the Practitioner or I may terminate the telemedicine visit at any time. I understand that I have the right to inspect all information obtained and/or recorded in the course of the telemedicine visit and may receive copies of available information for a reasonable fee.  I understand that some of the potential risks of receiving the Services via telemedicine include:   Delay or interruption in medical evaluation due to technological equipment failure or disruption;  Information transmitted may not be sufficient (e.g. poor resolution of images) to allow for appropriate medical decision making by the Practitioner; and/or   In rare instances, security protocols could fail, causing a breach of personal health information.  Furthermore, I acknowledge that it is my responsibility to provide information about my medical history, conditions and care that is complete and accurate to the best of my ability. I acknowledge that Practitioner's advice, recommendations, and/or decision may be based on factors not within their control, such as incomplete or inaccurate data provided by me or distortions of diagnostic images or specimens that may result from electronic transmissions. I understand that the  practice of medicine is not an Chief Strategy Officer and that Practitioner makes no warranties or guarantees regarding treatment outcomes. I acknowledge that I will receive a copy of this consent concurrently upon execution via email to the email address I last provided but may also request a printed copy by calling the office of [place].    I understand that my insurance will be billed for this visit.   I have read or had this consent read to me.  I understand the contents of this consent, which adequately explains the benefits and risks of the Services being provided via telemedicine.   I have been provided ample opportunity to ask questions regarding this consent and the Services and have had my questions answered to  my satisfaction.  I give my informed consent for the services to be provided through the use of telemedicine in my medical care  By participating in this telemedicine visit I agree to the above.

## 2018-11-02 ENCOUNTER — Other Ambulatory Visit: Payer: Self-pay

## 2018-11-02 ENCOUNTER — Ambulatory Visit (INDEPENDENT_AMBULATORY_CARE_PROVIDER_SITE_OTHER): Payer: Medicare HMO | Admitting: Nurse Practitioner

## 2018-11-02 ENCOUNTER — Encounter: Payer: Self-pay | Admitting: Nurse Practitioner

## 2018-11-02 VITALS — BP 120/73 | HR 75 | Temp 97.7°F | Resp 20 | Ht 62.0 in | Wt 260.0 lb

## 2018-11-02 DIAGNOSIS — R609 Edema, unspecified: Secondary | ICD-10-CM | POA: Diagnosis not present

## 2018-11-02 DIAGNOSIS — L989 Disorder of the skin and subcutaneous tissue, unspecified: Secondary | ICD-10-CM

## 2018-11-02 DIAGNOSIS — I4891 Unspecified atrial fibrillation: Secondary | ICD-10-CM

## 2018-11-02 DIAGNOSIS — R600 Localized edema: Secondary | ICD-10-CM

## 2018-11-02 DIAGNOSIS — I10 Essential (primary) hypertension: Secondary | ICD-10-CM

## 2018-11-02 DIAGNOSIS — E119 Type 2 diabetes mellitus without complications: Secondary | ICD-10-CM

## 2018-11-02 DIAGNOSIS — Z23 Encounter for immunization: Secondary | ICD-10-CM | POA: Diagnosis not present

## 2018-11-02 DIAGNOSIS — E785 Hyperlipidemia, unspecified: Secondary | ICD-10-CM

## 2018-11-02 DIAGNOSIS — E7800 Pure hypercholesterolemia, unspecified: Secondary | ICD-10-CM

## 2018-11-02 DIAGNOSIS — I4819 Other persistent atrial fibrillation: Secondary | ICD-10-CM

## 2018-11-02 DIAGNOSIS — F411 Generalized anxiety disorder: Secondary | ICD-10-CM | POA: Diagnosis not present

## 2018-11-02 LAB — LIPID PANEL
Chol/HDL Ratio: 2.9 ratio (ref 0.0–4.4)
Cholesterol, Total: 115 mg/dL (ref 100–199)
HDL: 39 mg/dL — ABNORMAL LOW (ref >39)
LDL Chol Calc (NIH): 58 mg/dL (ref 0–99)
Triglycerides: 90 mg/dL (ref 0–149)
VLDL Cholesterol Cal: 18 mg/dL (ref 5–40)

## 2018-11-02 LAB — CMP14+EGFR
ALT: 26 IU/L (ref 0–32)
AST: 33 IU/L (ref 0–40)
Albumin/Globulin Ratio: 1.3 (ref 1.2–2.2)
Albumin: 3.9 g/dL (ref 3.8–4.8)
Alkaline Phosphatase: 57 IU/L (ref 39–117)
BUN/Creatinine Ratio: 33 — ABNORMAL HIGH (ref 12–28)
BUN: 21 mg/dL (ref 8–27)
Bilirubin Total: 0.6 mg/dL (ref 0.0–1.2)
CO2: 24 mmol/L (ref 20–29)
Calcium: 9.6 mg/dL (ref 8.7–10.3)
Chloride: 101 mmol/L (ref 96–106)
Creatinine, Ser: 0.64 mg/dL (ref 0.57–1.00)
GFR calc Af Amer: 108 mL/min/{1.73_m2} (ref >59)
GFR calc non Af Amer: 93 mL/min/{1.73_m2} (ref >59)
Globulin, Total: 3 g/dL (ref 1.5–4.5)
Glucose: 228 mg/dL — ABNORMAL HIGH (ref 65–99)
Potassium: 4.4 mmol/L (ref 3.5–5.2)
Sodium: 141 mmol/L (ref 134–144)
Total Protein: 6.9 g/dL (ref 6.0–8.5)

## 2018-11-02 LAB — BAYER DCA HB A1C WAIVED: HB A1C (BAYER DCA - WAIVED): 7 % — ABNORMAL HIGH (ref <7.0)

## 2018-11-02 MED ORDER — AMLODIPINE BESYLATE 5 MG PO TABS: 5.0000 mg | ORAL_TABLET | Freq: Every day | ORAL | 1 refills | Status: DC

## 2018-11-02 MED ORDER — GLIPIZIDE 5 MG PO TABS: 5.0000 mg | ORAL_TABLET | Freq: Two times a day (BID) | ORAL | 1 refills | Status: DC

## 2018-11-02 MED ORDER — CLONIDINE HCL 0.2 MG PO TABS: 0.2000 mg | ORAL_TABLET | Freq: Two times a day (BID) | ORAL | 1 refills | Status: DC

## 2018-11-02 MED ORDER — METFORMIN HCL 1000 MG PO TABS: 1000.0000 mg | ORAL_TABLET | Freq: Two times a day (BID) | ORAL | 1 refills | Status: DC

## 2018-11-02 MED ORDER — FUROSEMIDE 20 MG PO TABS: 20.0000 mg | ORAL_TABLET | Freq: Every day | ORAL | 1 refills | Status: DC

## 2018-11-02 MED ORDER — APIXABAN 5 MG PO TABS: 5.0000 mg | ORAL_TABLET | Freq: Two times a day (BID) | ORAL | 1 refills | Status: DC

## 2018-11-02 MED ORDER — ALPRAZOLAM 0.25 MG PO TABS: 0.2500 mg | ORAL_TABLET | Freq: Every day | ORAL | 2 refills | Status: DC | PRN

## 2018-11-02 MED ORDER — METOPROLOL TARTRATE 50 MG PO TABS: 50.0000 mg | ORAL_TABLET | Freq: Two times a day (BID) | ORAL | 1 refills | Status: DC

## 2018-11-02 MED ORDER — SIMVASTATIN 40 MG PO TABS: ORAL_TABLET | ORAL | 1 refills | Status: DC

## 2018-11-02 MED ORDER — QUINAPRIL HCL 40 MG PO TABS: 40.0000 mg | ORAL_TABLET | Freq: Every day | ORAL | 1 refills | Status: DC

## 2018-11-02 NOTE — Progress Notes (Addendum)
Subjective:    Patient ID: Ashley [name], female    DOB: 1950/03/18, 68 y.o.   MRN: [id number]   Chief Complaint: Medical Management of Chronic Issues    HPI:  1. Essential hypertension No c/o chest pain, sob or headache. Does not check blood pressure at home BP Readings from Last 3 Encounters:  11/02/18 120/73  10/25/18 (!) 157/65  06/27/18 126/74     2. Type 2 diabetes mellitus without complication, without long-term current use of insulin (HCC) Her blood sugars run below 140 consistently. No hypoglycemia. Added glipizide at last visit Lab Results  Component Value Date   HGBA1C 8.2 (H) 06/27/2018     3. Hyperlipidemia, unspecified hyperlipidemia type Does not watch diet and does very little exercise. Lab Results  Component Value Date   CHOL 123 06/27/2018   HDL 37 (L) 06/27/2018   LDLCALC 46 06/27/2018   TRIG 200 (H) 06/27/2018   CHOLHDL 3.3 06/27/2018     4. Peripheral edema Always has edema in left ankle secondary to old fracture  5. Persistent atrial fibrillation Is on eliquis and denies any palpitations  6. Severe obesity (BMI >= 40) (HCC) No recent weight changes Wt Readings from Last 3 Encounters:  11/02/18 260 lb (117.9 kg)  10/25/18 245 lb (111.1 kg)  06/27/18 263 lb (119.3 kg)   BMI Readings from Last 3 Encounters:  11/02/18 47.55 kg/m  10/25/18 44.10 kg/m  06/27/18 48.10 kg/m       Outpatient Encounter Medications as of 11/02/2018  Medication Sig  . ACCU-CHEK SOFTCLIX LANCETS lancets Test BS BID  . acetaminophen (TYLENOL) 500 MG tablet Take 1,000 mg by mouth every 6 (six) hours as needed (for pain/headaches.).  Marland Kitchen Alcohol Swabs (B-D SINGLE USE SWABS REGULAR) PADS Use to check BS BID  . ALPRAZolam (XANAX) 0.25 MG tablet Take 1 tablet (0.25 mg total) by mouth daily as needed for anxiety.  Marland Kitchen amLODipine (NORVASC) 5 MG tablet Take 1 tablet (5 mg total) by mouth daily.  . Blood Glucose Monitoring Suppl (ACCU-CHEK AVIVA PLUS) w/Device KIT USE  AS DIRECTED  . Blood Glucose Monitoring Suppl (ACCU-CHEK GUIDE ME) w/Device KIT 1 kit by Does not apply route 2 (two) times daily.  Marland Kitchen CINNAMON PO Take 1 capsule by mouth daily.   . cloNIDine (CATAPRES) 0.2 MG tablet Take 1 tablet (0.2 mg total) by mouth 2 (two) times daily.  . clotrimazole-betamethasone (LOTRISONE) cream Apply 1 application topically 2 (two) times daily.  Marland Kitchen ELIQUIS 5 MG TABS tablet TAKE 1 TABLET TWICE DAILY  . furosemide (LASIX) 20 MG tablet Take 1 tablet (20 mg total) by mouth daily.  . Ginger, Zingiber officinalis, (GINGER ROOT PO) Take 1 capsule by mouth daily.   Marland Kitchen glipiZIDE (GLUCOTROL) 5 MG tablet Take 1 tablet (5 mg total) by mouth 2 (two) times daily.  Marland Kitchen glucose blood (ACCU-CHEK AVIVA) test strip Use to check BS BID  . metFORMIN (GLUCOPHAGE) 1000 MG tablet TAKE 1 TABLET TWICE DAILY WITH MEALS  . metoprolol tartrate (LOPRESSOR) 50 MG tablet Take 1 tablet (50 mg total) by mouth 2 (two) times daily.  Marland Kitchen nystatin cream (MYCOSTATIN) Apply 1 application topically 2 (two) times daily.  . quinapril (ACCUPRIL) 40 MG tablet Take 1 tablet (40 mg total) by mouth daily.  . simvastatin (ZOCOR) 40 MG tablet TAKE 1 TABLET EVERY DAY IN THE EVENING  . Turmeric Curcumin 500 MG CAPS Take 500 mg by mouth daily.      Past Surgical History:  Procedure Laterality  Date  . CYST EXCISION Left 03/15/2017   from buttocks  . ORIF ANKLE FRACTURE Left 04/20/2017   Procedure: OPEN REDUCTION INTERNAL FIXATION (ORIF) LEFT ANKLE FRACTURE;  Surgeon: Netta Cedars, MD;  Location: [facility];  Service: Orthopedics;  Laterality: Left;    Family History  Problem Relation Age of Onset  . Diabetes Mother   . CAD Mother        CABG at age 14  . Hypertension Mother   . Diabetes Father   . Heart disease Father        CHF  . Diabetes Sister   . CAD Sister   . Hypertension Sister   . Diabetes Brother   . Hypertension Brother   . Healthy Sister     New complaints: Skin lesion right shoulder- has gotten  bigger- does not itch or drain  Social history: Lives by herself. Is a school bus driver  Controlled substance contract: 11/02/18    Review of Systems  Constitutional: Negative for activity change and appetite change.  HENT: Negative.   Eyes: Negative for pain.  Respiratory: Negative for shortness of breath.   Cardiovascular: Negative for chest pain, palpitations and leg swelling.  Gastrointestinal: Negative for abdominal pain.  Endocrine: Negative for polydipsia.  Genitourinary: Negative.   Skin: Negative for rash.  Neurological: Negative for dizziness, weakness and headaches.  Hematological: Does not bruise/bleed easily.  Psychiatric/Behavioral: Negative.   All other systems reviewed and are negative.      Objective:   Physical Exam Vitals signs and nursing note reviewed.  Constitutional:      General: She is not in acute distress.    Appearance: Normal appearance. She is well-developed.  HENT:     Head: Normocephalic.     Nose: Nose normal.  Eyes:     Pupils: Pupils are equal, round, and reactive to light.  Neck:     Musculoskeletal: Normal range of motion and neck supple.     Vascular: No carotid bruit or JVD.  Cardiovascular:     Rate and Rhythm: Normal rate and regular rhythm.     Heart sounds: Normal heart sounds.  Pulmonary:     Effort: Pulmonary effort is normal. No respiratory distress.     Breath sounds: Normal breath sounds. No wheezing or rales.  Chest:     Chest wall: No tenderness.  Abdominal:     General: Bowel sounds are normal. There is no distension or abdominal bruit.     Palpations: Abdomen is soft. There is no hepatomegaly, splenomegaly, mass or pulsatile mass.     Tenderness: There is no abdominal tenderness.  Musculoskeletal: Normal range of motion.  Lymphadenopathy:     Cervical: No cervical adenopathy.  Skin:    General: Skin is warm and dry.     Comments: Annular macular skin lesion-purplish brown in color on right shoulder   Neurological:     Mental Status: She is alert and oriented to person, place, and time.     Deep Tendon Reflexes: Reflexes are normal and symmetric.  Psychiatric:        Behavior: Behavior normal.        Thought Content: Thought content normal.        Judgment: Judgment normal.    BP 120/73   Pulse 75   Temp 97.7 F (36.5 C) (Oral)   Resp 20   Ht _0  (1.575 m)   Wt 260 lb (117.9 kg)   SpO2 98%   BMI 47.55 kg/m  Hgba1c 7.0       Assessment & Plan:  Eudell Julian comes in today with chief complaint of Medical Management of Chronic Issues   Diagnosis and orders addressed:  1. Essential hypertension Low sodium diet - CMP14+EGFR - amLODipine (NORVASC) 5 MG tablet; Take 1 tablet (5 mg total) by mouth daily.  Dispense: 90 tablet; Refill: 1 - quinapril (ACCUPRIL) 40 MG tablet; Take 1 tablet (40 mg total) by mouth daily.  Dispense: 90 tablet; Refill: 1 - cloNIDine (CATAPRES) 0.2 MG tablet; Take 1 tablet (0.2 mg total) by mouth 2 (two) times daily.  Dispense: 180 tablet; Refill: 1  2. Type 2 diabetes mellitus without complication, without long-term current use of insulin (HCC) continue to watch carbs in diet - Bayer DCA Hb A1c Waived - metFORMIN (GLUCOPHAGE) 1000 MG tablet; Take 1 tablet (1,000 mg total) by mouth 2 (two) times daily with a meal.  Dispense: 180 tablet; Refill: 1 - glipiZIDE (GLUCOTROL) 5 MG tablet; Take 1 tablet (5 mg total) by mouth 2 (two) times daily.  Dispense: 180 tablet; Refill: 1  3. Hyperlipidemia, unspecified hyperlipidemia type Low fat diet - Lipid panel  4. Peripheral edema Elevate legs when sitting - furosemide (LASIX) 20 MG tablet; Take 1 tablet (20 mg total) by mouth daily.  Dispense: 90 tablet; Refill: 1  5. Persistent atrial fibrillation Avoid caffeine - metoprolol tartrate (LOPRESSOR) 50 MG tablet; Take 1 tablet (50 mg total) by mouth 2 (two) times daily.  Dispense: 180 tablet; Refill: 1  6. Severe obesity (BMI >= 40) (HCC) Discussed  diet and exercise for person with BMI >25 Will recheck weight in 3-6 months  7. Atrial fibrillation, unspecified type (HCC) - apixaban (ELIQUIS) 5 MG TABS tablet; Take 1 tablet (5 mg total) by mouth 2 (two) times daily.  Dispense: 180 tablet; Refill: 1  8. GAD (generalized anxiety disorder) - ALPRAZolam (XANAX) 0.25 MG tablet; Take 1 tablet (0.25 mg total) by mouth daily as needed for anxiety.  Dispense: 30 tablet; Refill: 2  9. Pure hypercholesterolemia - simvastatin (ZOCOR) 40 MG tablet; TAKE 1 TABLET EVERY DAY IN THE EVENING  Dispense: 90 tablet; Refill: 1  10. Skin lesion right upper arm Ref to derm  Labs pending Health Maintenance reviewed Diet and exercise encouraged  Follow up plan: 3 months   [place], FNP

## 2018-11-02 NOTE — Addendum Note (Signed)
Addended by: Chevis Pretty on: 11/02/2018 11:26 AM   Modules accepted: Orders

## 2018-11-05 NOTE — Addendum Note (Signed)
Addended by: Rolena Infante on: 11/05/2018 10:19 AM   Modules accepted: Orders

## 2018-11-06 ENCOUNTER — Telehealth: Payer: Self-pay | Admitting: Nurse Practitioner

## 2018-11-08 ENCOUNTER — Other Ambulatory Visit: Payer: Self-pay | Admitting: Nurse Practitioner

## 2018-11-08 MED ORDER — PREDNISONE 20 MG PO TABS: ORAL_TABLET | ORAL | 0 refills | Status: DC

## 2018-11-08 NOTE — Progress Notes (Signed)
Due to rash will start prednisone 20mg  2 po daily for 5 days- watch carbs in diet strictly because blood sugars will go up with prednisone.

## 2018-11-21 DIAGNOSIS — L570 Actinic keratosis: Secondary | ICD-10-CM | POA: Diagnosis not present

## 2018-11-21 DIAGNOSIS — L814 Other melanin hyperpigmentation: Secondary | ICD-10-CM | POA: Diagnosis not present

## 2018-11-21 DIAGNOSIS — D485 Neoplasm of uncertain behavior of skin: Secondary | ICD-10-CM | POA: Diagnosis not present

## 2018-11-21 DIAGNOSIS — L819 Disorder of pigmentation, unspecified: Secondary | ICD-10-CM | POA: Diagnosis not present

## 2018-11-21 DIAGNOSIS — L821 Other seborrheic keratosis: Secondary | ICD-10-CM | POA: Diagnosis not present

## 2018-12-22 ENCOUNTER — Other Ambulatory Visit: Payer: Self-pay | Admitting: Nurse Practitioner

## 2018-12-25 ENCOUNTER — Other Ambulatory Visit: Payer: Self-pay

## 2018-12-25 DIAGNOSIS — L209 Atopic dermatitis, unspecified: Secondary | ICD-10-CM

## 2018-12-25 MED ORDER — CLOTRIMAZOLE-BETAMETHASONE 1-0.05 % EX CREA: 1.0000 "application " | TOPICAL_CREAM | Freq: Two times a day (BID) | CUTANEOUS | 1 refills | Status: DC

## 2018-12-26 ENCOUNTER — Other Ambulatory Visit: Payer: Self-pay | Admitting: Nurse Practitioner

## 2019-01-16 ENCOUNTER — Other Ambulatory Visit: Payer: Self-pay | Admitting: *Deleted

## 2019-01-16 DIAGNOSIS — R609 Edema, unspecified: Secondary | ICD-10-CM

## 2019-01-16 MED ORDER — FUROSEMIDE 20 MG PO TABS: 20.0000 mg | ORAL_TABLET | Freq: Every day | ORAL | 0 refills | Status: DC

## 2019-01-28 ENCOUNTER — Other Ambulatory Visit: Payer: Self-pay | Admitting: *Deleted

## 2019-01-28 DIAGNOSIS — R609 Edema, unspecified: Secondary | ICD-10-CM

## 2019-01-28 MED ORDER — FUROSEMIDE 20 MG PO TABS: 20.0000 mg | ORAL_TABLET | Freq: Every day | ORAL | 0 refills | Status: DC

## 2019-02-05 ENCOUNTER — Other Ambulatory Visit: Payer: Self-pay

## 2019-02-06 ENCOUNTER — Ambulatory Visit (INDEPENDENT_AMBULATORY_CARE_PROVIDER_SITE_OTHER): Payer: Self-pay | Admitting: Nurse Practitioner

## 2019-02-06 DIAGNOSIS — Z024 Encounter for examination for driving license: Secondary | ICD-10-CM

## 2019-02-06 LAB — URINALYSIS
Bilirubin, UA: NEGATIVE
Glucose, UA: NEGATIVE
Nitrite, UA: NEGATIVE
Protein,UA: NEGATIVE
Specific Gravity, UA: 1.025 (ref 1.005–1.030)
Urobilinogen, Ur: 0.2 mg/dL (ref 0.2–1.0)
pH, UA: 5 (ref 5.0–7.5)

## 2019-02-06 NOTE — Progress Notes (Signed)
Patient ID: Ashley [name], female   DOB: Aug 06, 1950, 68 y.o.   MRN: [id number]  [employer]- see scanned in document

## 2019-02-15 ENCOUNTER — Encounter: Payer: Self-pay | Admitting: Nurse Practitioner

## 2019-02-15 ENCOUNTER — Ambulatory Visit (INDEPENDENT_AMBULATORY_CARE_PROVIDER_SITE_OTHER): Payer: Medicare HMO | Admitting: Nurse Practitioner

## 2019-02-15 DIAGNOSIS — E7800 Pure hypercholesterolemia, unspecified: Secondary | ICD-10-CM | POA: Diagnosis not present

## 2019-02-15 DIAGNOSIS — E119 Type 2 diabetes mellitus without complications: Secondary | ICD-10-CM

## 2019-02-15 DIAGNOSIS — I10 Essential (primary) hypertension: Secondary | ICD-10-CM | POA: Diagnosis not present

## 2019-02-15 DIAGNOSIS — I4819 Other persistent atrial fibrillation: Secondary | ICD-10-CM

## 2019-02-15 DIAGNOSIS — F411 Generalized anxiety disorder: Secondary | ICD-10-CM

## 2019-02-15 DIAGNOSIS — R609 Edema, unspecified: Secondary | ICD-10-CM

## 2019-02-15 MED ORDER — FUROSEMIDE 20 MG PO TABS: 20.0000 mg | ORAL_TABLET | Freq: Every day | ORAL | 1 refills | Status: DC

## 2019-02-15 MED ORDER — AMLODIPINE BESYLATE 5 MG PO TABS: 5.0000 mg | ORAL_TABLET | Freq: Every day | ORAL | 1 refills | Status: DC

## 2019-02-15 MED ORDER — METFORMIN HCL 1000 MG PO TABS: 1000.0000 mg | ORAL_TABLET | Freq: Two times a day (BID) | ORAL | 1 refills | Status: DC

## 2019-02-15 MED ORDER — METOPROLOL TARTRATE 50 MG PO TABS: 50.0000 mg | ORAL_TABLET | Freq: Two times a day (BID) | ORAL | 1 refills | Status: DC

## 2019-02-15 MED ORDER — ALPRAZOLAM 0.25 MG PO TABS: 0.2500 mg | ORAL_TABLET | Freq: Every day | ORAL | 2 refills | Status: DC | PRN

## 2019-02-15 MED ORDER — APIXABAN 5 MG PO TABS: 5.0000 mg | ORAL_TABLET | Freq: Two times a day (BID) | ORAL | 1 refills | Status: DC

## 2019-02-15 MED ORDER — GLIPIZIDE 5 MG PO TABS: 5.0000 mg | ORAL_TABLET | Freq: Two times a day (BID) | ORAL | 1 refills | Status: DC

## 2019-02-15 MED ORDER — QUINAPRIL HCL 40 MG PO TABS: 40.0000 mg | ORAL_TABLET | Freq: Every day | ORAL | 1 refills | Status: DC

## 2019-02-15 MED ORDER — SIMVASTATIN 40 MG PO TABS: ORAL_TABLET | ORAL | 1 refills | Status: DC

## 2019-02-15 MED ORDER — CLONIDINE HCL 0.2 MG PO TABS: 0.2000 mg | ORAL_TABLET | Freq: Two times a day (BID) | ORAL | 1 refills | Status: DC

## 2019-02-15 NOTE — Progress Notes (Signed)
Virtual Visit via telephone Note Due to COVID-19 pandemic this visit was conducted virtually. This visit type was conducted due to national recommendations for restrictions regarding the COVID-19 Pandemic (e.g. social distancing, sheltering in place) in an effort to limit this patient's exposure and mitigate transmission in our community. All issues noted in this document were discussed and addressed.  A physical exam was not performed with this format.  I connected with Ashley [name] on 02/15/19 at 10:05 by telephone and verified that I am speaking with the correct person using two identifiers. Ashley [name] is currently located at [facility] and no one is currently with her during visit. The provider, Mary-Margaret Hassell Done, FNP is located in their office at time of visit.  I discussed the limitations, risks, security and privacy concerns of performing an evaluation and management service by telephone and the availability of in person appointments. I also discussed with the patient that there may be a patient responsible charge related to this service. The patient expressed understanding and agreed to proceed.   History and Present Illness:   Chief Complaint: Medical Management of Chronic Issues    HPI:  1. Essential hypertension No c/o chest pain, sob or headache. Does not check blood pressure at home. BP Readings from Last 3 Encounters:  11/02/18 120/73  10/25/18 (!) 157/65  06/27/18 126/74     2. Hyperlipidemia, unspecified hyperlipidemia type Does not really watch diet and does no exxercise. Lab Results  Component Value Date   CHOL 115 11/02/2018   HDL 39 (L) 11/02/2018   LDLCALC 58 11/02/2018   TRIG 90 11/02/2018   CHOLHDL 2.9 11/02/2018     3. Type 2 diabetes mellitus without complication, without long-term current use of insulin (HCC) Fasting blood sugars running around 120-130 most of the time. She denies any symptoms of low blood sugars. Lab Results  Component Value Date    HGBA1C 7.0 (H) 11/02/2018     4. Persistent atrial fibrillation (HCC) Is on eliquis with no bleeding  5. Peripheral edema always has some swelling. Usually resolves some at night.  6. Severe obesity (BMI >= 40) (HCC) No recent weight changes Wt Readings from Last 3 Encounters:  11/02/18 260 lb (117.9 kg)  10/25/18 245 lb (111.1 kg)  06/27/18 263 lb (119.3 kg)   BMI Readings from Last 3 Encounters:  11/02/18 47.55 kg/m  10/25/18 44.10 kg/m  06/27/18 48.10 kg/m       Outpatient Encounter Medications as of 02/15/2019  Medication Sig  . Accu-Chek Softclix Lancets lancets TEST BLOOD SUGAR TWICE DAILY Dx E11.9  . acetaminophen (TYLENOL) 500 MG tablet Take 1,000 mg by mouth every 6 (six) hours as needed (for pain/headaches.).  Marland Kitchen Alcohol Swabs (B-D SINGLE USE SWABS REGULAR) PADS CHECK BLOOD SUGAR TWICE DAILY Dx e11.9  . ALPRAZolam (XANAX) 0.25 MG tablet Take 1 tablet (0.25 mg total) by mouth daily as needed for anxiety.  Marland Kitchen amLODipine (NORVASC) 5 MG tablet Take 1 tablet (5 mg total) by mouth daily.  Marland Kitchen apixaban (ELIQUIS) 5 MG TABS tablet Take 1 tablet (5 mg total) by mouth 2 (two) times daily.  . Blood Glucose Monitoring Suppl (ACCU-CHEK AVIVA PLUS) w/Device KIT USE AS DIRECTED  . Blood Glucose Monitoring Suppl (ACCU-CHEK GUIDE ME) w/Device KIT 1 kit by Does not apply route 2 (two) times daily.  Marland Kitchen CINNAMON PO Take 1 capsule by mouth daily.   . cloNIDine (CATAPRES) 0.2 MG tablet Take 1 tablet (0.2 mg total) by mouth 2 (two) times daily.  Marland Kitchen  clotrimazole-betamethasone (LOTRISONE) cream Apply 1 application topically 2 (two) times daily.  . furosemide (LASIX) 20 MG tablet Take 1 tablet (20 mg total) by mouth daily.  . Ginger, Zingiber officinalis, (GINGER ROOT PO) Take 1 capsule by mouth daily.   Marland Kitchen glipiZIDE (GLUCOTROL) 5 MG tablet Take 1 tablet (5 mg total) by mouth 2 (two) times daily.  Marland Kitchen glucose blood (ACCU-CHEK AVIVA PLUS) test strip Test blood sugar twice daily Dx E11.9  .  metFORMIN (GLUCOPHAGE) 1000 MG tablet Take 1 tablet (1,000 mg total) by mouth 2 (two) times daily with a meal.  . metoprolol tartrate (LOPRESSOR) 50 MG tablet Take 1 tablet (50 mg total) by mouth 2 (two) times daily.  Marland Kitchen nystatin cream (MYCOSTATIN) Apply 1 application topically 2 (two) times daily.  . predniSONE (DELTASONE) 20 MG tablet 2 po at sametime daily for 5 days  . quinapril (ACCUPRIL) 40 MG tablet Take 1 tablet (40 mg total) by mouth daily.  . simvastatin (ZOCOR) 40 MG tablet TAKE 1 TABLET EVERY DAY IN THE EVENING  . Turmeric Curcumin 500 MG CAPS Take 500 mg by mouth daily.      Past Surgical History:  Procedure Laterality Date  . CYST EXCISION Left 03/15/2017   from buttocks  . ORIF ANKLE FRACTURE Left 04/20/2017   Procedure: OPEN REDUCTION INTERNAL FIXATION (ORIF) LEFT ANKLE FRACTURE;  Surgeon: Netta Cedars, MD;  Location: [facility];  Service: Orthopedics;  Laterality: Left;    Family History  Problem Relation Age of Onset  . Diabetes Mother   . CAD Mother        CABG at age 59  . Hypertension Mother   . Diabetes Father   . Heart disease Father        CHF  . Diabetes Sister   . CAD Sister   . Hypertension Sister   . Diabetes Brother   . Hypertension Brother   . Healthy Sister     New complaints: None today  Social history: Lives by herself. Her daughter checks on her daily  Controlled substance contract: 11/28/18   Review of Systems  Constitutional: Negative for diaphoresis and weight loss.  Eyes: Negative for blurred vision, double vision and pain.  Respiratory: Negative for shortness of breath.   Cardiovascular: Negative for chest pain, palpitations, orthopnea and leg swelling.  Gastrointestinal: Negative for abdominal pain.  Skin: Negative for rash.  Neurological: Negative for dizziness, sensory change, loss of consciousness, weakness and headaches.  Endo/Heme/Allergies: Negative for polydipsia. Does not bruise/bleed easily.  Psychiatric/Behavioral:  Negative for memory loss. The patient does not have insomnia.   All other systems reviewed and are negative.    Observations/Objective: Alert and oriented- answers all questions appropriately No distress    Assessment and Plan: Ashley [name] comes in today with chief complaint of Medical Management of Chronic Issues   Diagnosis and orders addressed:  1. Essential hypertension Low sodium diet - amLODipine (NORVASC) 5 MG tablet; Take 1 tablet (5 mg total) by mouth daily.  Dispense: 90 tablet; Refill: 1 - quinapril (ACCUPRIL) 40 MG tablet; Take 1 tablet (40 mg total) by mouth daily.  Dispense: 90 tablet; Refill: 1 - cloNIDine (CATAPRES) 0.2 MG tablet; Take 1 tablet (0.2 mg total) by mouth 2 (two) times daily.  Dispense: 180 tablet; Refill: 1  2. Hyperlipidemia, pure  Low fat diet - simvastatin (ZOCOR) 40 MG tablet; TAKE 1 TABLET EVERY DAY IN THE EVENING  Dispense: 90 tablet; Refill: 1  3. Type 2 diabetes mellitus without complication,  without long-term current use of insulin (HCC) Continue to watch carbs in diet - glipiZIDE (GLUCOTROL) 5 MG tablet; Take 1 tablet (5 mg total) by mouth 2 (two) times daily.  Dispense: 180 tablet; Refill: 1 - metFORMIN (GLUCOPHAGE) 1000 MG tablet; Take 1 tablet (1,000 mg total) by mouth 2 (two) times daily with a meal.  Dispense: 180 tablet; Refill: 1  4. Persistent atrial fibrillation (HCC) Avoid caffeine - metoprolol tartrate (LOPRESSOR) 50 MG tablet; Take 1 tablet (50 mg total) by mouth 2 (two) times daily.  Dispense: 180 tablet; Refill: 1 - apixaban (ELIQUIS) 5 MG TABS tablet; Take 1 tablet (5 mg total) by mouth 2 (two) times daily.  Dispense: 180 tablet; Refill: 15. Peripheral edema Elevate legs when sitting - furosemide (LASIX) 20 MG tablet; Take 1 tablet (20 mg total) by mouth daily.  Dispense: 90 tablet; Refill: 1  6. Severe obesity (BMI >= 40) (HCC) Discussed diet and exercise for person with BMI >25 Will recheck weight in 3-6 months   7.  GAD (generalized anxiety disorder) Stress management - ALPRAZolam (XANAX) 0.25 MG tablet; Take 1 tablet (0.25 mg total) by mouth daily as needed for anxiety.  Dispense: 30 tablet; Refill: 2     Labs pending Health Maintenance reviewed Diet and exercise encouraged  Follow up plan: 3 months      I discussed the assessment and treatment plan with the patient. The patient was provided an opportunity to ask questions and all were answered. The patient agreed with the plan and demonstrated an understanding of the instructions.   The patient was advised to call back or seek an in-person evaluation if the symptoms worsen or if the condition fails to improve as anticipated.  The above assessment and management plan was discussed with the patient. The patient verbalized understanding of and has agreed to the management plan. Patient is aware to call the clinic if symptoms persist or worsen. Patient is aware when to return to the clinic for a follow-up visit. Patient educated on when it is appropriate to go to the emergency department.   Time call ended:  1025  I provided 20 minutes of non-face-to-face time during this encounter.    Mary-Margaret Hassell Done, FNP

## 2019-02-28 ENCOUNTER — Encounter: Payer: Self-pay | Admitting: Nurse Practitioner

## 2019-02-28 ENCOUNTER — Ambulatory Visit (INDEPENDENT_AMBULATORY_CARE_PROVIDER_SITE_OTHER): Payer: Medicare HMO | Admitting: Nurse Practitioner

## 2019-02-28 DIAGNOSIS — J029 Acute pharyngitis, unspecified: Secondary | ICD-10-CM

## 2019-02-28 NOTE — Progress Notes (Signed)
   Virtual Visit via telephone Note Due to COVID-19 pandemic this visit was conducted virtually. This visit type was conducted due to national recommendations for restrictions regarding the COVID-19 Pandemic (e.g. social distancing, sheltering in place) in an effort to limit this patient's exposure and mitigate transmission in our community. All issues noted in this document were discussed and addressed.  A physical exam was not performed with this format.  I connected with Ashley [name] on 02/28/19 at 11:10 by telephone and verified that I am speaking with the correct person using two identifiers. Ashley [name] is currently located at home and no one is currently with her during visit. The provider, Mary-Margaret Hassell Done, FNP is located in their office at time of visit.  I discussed the limitations, risks, security and privacy concerns of performing an evaluation and management service by telephone and the availability of in person appointments. I also discussed with the patient that there may be a patient responsible charge related to this service. The patient expressed understanding and agreed to proceed.   History and Present Illness:   Chief Complaint: Sore Throat   HPI Patient has had sore throat the last 3 mornings.only bothered her in the mornings and late in evenings. She denies fever, or headache. She says when she looks at throat here are white patches back there. Feels better today   Review of Systems  Constitutional: Negative for diaphoresis and weight loss.  HENT: Positive for sore throat.   Eyes: Negative for blurred vision, double vision and pain.  Respiratory: Negative for shortness of breath.   Cardiovascular: Negative for chest pain, palpitations, orthopnea and leg swelling.  Gastrointestinal: Negative for abdominal pain.  Skin: Negative for rash.  Neurological: Negative for dizziness, sensory change, loss of consciousness, weakness and headaches.  Endo/Heme/Allergies:  Negative for polydipsia. Does not bruise/bleed easily.  Psychiatric/Behavioral: Negative for memory loss. The patient does not have insomnia.   All other systems reviewed and are negative.    Observations/Objective: Alert and oriented- answers all questions appropriately No distress    Assessment and Plan: Redonna Schatz in today with chief complaint of Sore Throat   1. Viral pharyngitis Force fluids Motrin or tylenol OTC OTC decongestant Throat lozenges if help New toothbrush in 3 days    Follow Up Instructions: prn    I discussed the assessment and treatment plan with the patient. The patient was provided an opportunity to ask questions and all were answered. The patient agreed with the plan and demonstrated an understanding of the instructions.   The patient was advised to call back or seek an in-person evaluation if the symptoms worsen or if the condition fails to improve as anticipated.  The above assessment and management plan was discussed with the patient. The patient verbalized understanding of and has agreed to the management plan. Patient is aware to call the clinic if symptoms persist or worsen. Patient is aware when to return to the clinic for a follow-up visit. Patient educated on when it is appropriate to go to the emergency department.   Time call ended:  11:20  I provided 10 minutes of non-face-to-face time during this encounter.    Mary-Margaret Hassell Done, FNP

## 2019-04-16 ENCOUNTER — Ambulatory Visit (INDEPENDENT_AMBULATORY_CARE_PROVIDER_SITE_OTHER): Payer: Medicare HMO

## 2019-04-16 DIAGNOSIS — Z0000 Encounter for general adult medical examination without abnormal findings: Secondary | ICD-10-CM | POA: Diagnosis not present

## 2019-04-16 NOTE — Progress Notes (Signed)
points  Total Score 0   (Normal:0-7, Significant for Dysfunction: >8)  Normal Cognitive  Function Screening: Yes   Immunization & Health Maintenance Record Immunization History  Administered Date(s) Administered  . Fluad Quad(high Dose 65+) 11/02/2018  . Influenza, High Dose Seasonal PF 11/07/2017  . Influenza,inj,Quad PF,6+ Mos 10/31/2012, 11/14/2014, 11/03/2016  . Influenza-Unspecified 10/25/2013, 11/03/2016  . Pneumococcal Conjugate-13 11/02/2018  . Pneumococcal Polysaccharide-23 10/31/2012, 05/03/2017  . Tdap 08/18/2017  . Zoster 01/08/2013  . Zoster Recombinat (Shingrix) 08/18/2017, 11/07/2017    Health Maintenance  Topic Date Due  . Hepatitis C Screening  11/02/2019 (Originally 09/30/1950)  . HEMOGLOBIN A1C  05/02/2019  . OPHTHALMOLOGY EXAM  09/14/2019  . FOOT EXAM  11/02/2019  . MAMMOGRAM  12/27/2019  . COLONOSCOPY  03/22/2020  . TETANUS/TDAP  08/19/2027  . INFLUENZA VACCINE  Completed  . DEXA SCAN  Completed  . PNA vac Low Risk Adult  Completed       Assessment  This is a routine wellness examination for Ashley [name].  Health Maintenance: Due or Overdue There are no preventive care reminders to display for this patient.  Ashley [name] does not need a referral for Community Assistance: Care Management:   no Social Work:    no Prescription Assistance:  no Nutrition/Diabetes Education:  no   Plan:  Personalized Goals Goals Addressed            This Visit's Progress   . Patient Stated       04/16/2019 AWV Goal: Exercise for General Health   Patient will verbalize understanding of the benefits of increased physical activity:  Exercising regularly is important. It will improve your overall fitness, flexibility, and endurance.  Regular exercise also will improve your overall health. It can help you control your weight, reduce stress, and improve your bone density.  Over the next year, patient will increase physical activity as tolerated with a goal of at least 150 minutes of moderate physical activity per week.   You can tell that you are  exercising at a moderate intensity if your heart starts beating faster and you start breathing faster but can still hold a conversation.  Moderate-intensity exercise ideas include:  Walking 1 mile (1.6 km) in about 15 minutes  Biking  Hiking  Golfing  Dancing  Water aerobics  Patient will verbalize understanding of everyday activities that increase physical activity by providing examples like the following: ? Yard work, such as: ? Pushing a lawn mower ? Raking and bagging leaves ? Washing your car ? Pushing a stroller ? Shoveling snow ? Gardening ? Washing windows or floors  Patient will be able to explain general safety guidelines for exercising:   Before you start a new exercise program, talk with your health care provider.  Do not exercise so much that you hurt yourself, feel dizzy, or get very short of breath.  Wear comfortable clothes and wear shoes with good support.  Drink plenty of water while you exercise to prevent dehydration or heat stroke.  Work out until your breathing and your heartbeat get faster.       Personalized Health Maintenance & Screening Recommendations  Bone densitometry screening  Lung Cancer Screening Recommended: no (Low Dose CT Chest recommended if Age 55-80 years, 30 pack-year currently smoking OR have quit w/in past 15 years) Hepatitis C Screening recommended: no HIV Screening recommended: no  Advanced Directives: Written information was not prepared per patient's request.  Referrals & Orders No orders of the defined types were placed   points  Total Score 0   (Normal:0-7, Significant for Dysfunction: >8)  Normal Cognitive  Function Screening: Yes   Immunization & Health Maintenance Record Immunization History  Administered Date(s) Administered  . Fluad Quad(high Dose 65+) 11/02/2018  . Influenza, High Dose Seasonal PF 11/07/2017  . Influenza,inj,Quad PF,6+ Mos 10/31/2012, 11/14/2014, 11/03/2016  . Influenza-Unspecified 10/25/2013, 11/03/2016  . Pneumococcal Conjugate-13 11/02/2018  . Pneumococcal Polysaccharide-23 10/31/2012, 05/03/2017  . Tdap 08/18/2017  . Zoster 01/08/2013  . Zoster Recombinat (Shingrix) 08/18/2017, 11/07/2017    Health Maintenance  Topic Date Due  . Hepatitis C Screening  11/02/2019 (Originally 09/30/1950)  . HEMOGLOBIN A1C  05/02/2019  . OPHTHALMOLOGY EXAM  09/14/2019  . FOOT EXAM  11/02/2019  . MAMMOGRAM  12/27/2019  . COLONOSCOPY  03/22/2020  . TETANUS/TDAP  08/19/2027  . INFLUENZA VACCINE  Completed  . DEXA SCAN  Completed  . PNA vac Low Risk Adult  Completed       Assessment  This is a routine wellness examination for Ashley [name].  Health Maintenance: Due or Overdue There are no preventive care reminders to display for this patient.  Ashley [name] does not need a referral for Community Assistance: Care Management:   no Social Work:    no Prescription Assistance:  no Nutrition/Diabetes Education:  no   Plan:  Personalized Goals Goals Addressed            This Visit's Progress   . Patient Stated       04/16/2019 AWV Goal: Exercise for General Health   Patient will verbalize understanding of the benefits of increased physical activity:  Exercising regularly is important. It will improve your overall fitness, flexibility, and endurance.  Regular exercise also will improve your overall health. It can help you control your weight, reduce stress, and improve your bone density.  Over the next year, patient will increase physical activity as tolerated with a goal of at least 150 minutes of moderate physical activity per week.   You can tell that you are  exercising at a moderate intensity if your heart starts beating faster and you start breathing faster but can still hold a conversation.  Moderate-intensity exercise ideas include:  Walking 1 mile (1.6 km) in about 15 minutes  Biking  Hiking  Golfing  Dancing  Water aerobics  Patient will verbalize understanding of everyday activities that increase physical activity by providing examples like the following: ? Yard work, such as: ? Pushing a lawn mower ? Raking and bagging leaves ? Washing your car ? Pushing a stroller ? Shoveling snow ? Gardening ? Washing windows or floors  Patient will be able to explain general safety guidelines for exercising:   Before you start a new exercise program, talk with your health care provider.  Do not exercise so much that you hurt yourself, feel dizzy, or get very short of breath.  Wear comfortable clothes and wear shoes with good support.  Drink plenty of water while you exercise to prevent dehydration or heat stroke.  Work out until your breathing and your heartbeat get faster.       Personalized Health Maintenance & Screening Recommendations  Bone densitometry screening  Lung Cancer Screening Recommended: no (Low Dose CT Chest recommended if Age 55-80 years, 30 pack-year currently smoking OR have quit w/in past 15 years) Hepatitis C Screening recommended: no HIV Screening recommended: no  Advanced Directives: Written information was not prepared per patient's request.  Referrals & Orders No orders of the defined types were placed   points  Total Score 0   (Normal:0-7, Significant for Dysfunction: >8)  Normal Cognitive  Function Screening: Yes   Immunization & Health Maintenance Record Immunization History  Administered Date(s) Administered  . Fluad Quad(high Dose 65+) 11/02/2018  . Influenza, High Dose Seasonal PF 11/07/2017  . Influenza,inj,Quad PF,6+ Mos 10/31/2012, 11/14/2014, 11/03/2016  . Influenza-Unspecified 10/25/2013, 11/03/2016  . Pneumococcal Conjugate-13 11/02/2018  . Pneumococcal Polysaccharide-23 10/31/2012, 05/03/2017  . Tdap 08/18/2017  . Zoster 01/08/2013  . Zoster Recombinat (Shingrix) 08/18/2017, 11/07/2017    Health Maintenance  Topic Date Due  . Hepatitis C Screening  11/02/2019 (Originally 09/30/1950)  . HEMOGLOBIN A1C  05/02/2019  . OPHTHALMOLOGY EXAM  09/14/2019  . FOOT EXAM  11/02/2019  . MAMMOGRAM  12/27/2019  . COLONOSCOPY  03/22/2020  . TETANUS/TDAP  08/19/2027  . INFLUENZA VACCINE  Completed  . DEXA SCAN  Completed  . PNA vac Low Risk Adult  Completed       Assessment  This is a routine wellness examination for Ashley [name].  Health Maintenance: Due or Overdue There are no preventive care reminders to display for this patient.  Ashley [name] does not need a referral for Community Assistance: Care Management:   no Social Work:    no Prescription Assistance:  no Nutrition/Diabetes Education:  no   Plan:  Personalized Goals Goals Addressed            This Visit's Progress   . Patient Stated       04/16/2019 AWV Goal: Exercise for General Health   Patient will verbalize understanding of the benefits of increased physical activity:  Exercising regularly is important. It will improve your overall fitness, flexibility, and endurance.  Regular exercise also will improve your overall health. It can help you control your weight, reduce stress, and improve your bone density.  Over the next year, patient will increase physical activity as tolerated with a goal of at least 150 minutes of moderate physical activity per week.   You can tell that you are  exercising at a moderate intensity if your heart starts beating faster and you start breathing faster but can still hold a conversation.  Moderate-intensity exercise ideas include:  Walking 1 mile (1.6 km) in about 15 minutes  Biking  Hiking  Golfing  Dancing  Water aerobics  Patient will verbalize understanding of everyday activities that increase physical activity by providing examples like the following: ? Yard work, such as: ? Pushing a lawn mower ? Raking and bagging leaves ? Washing your car ? Pushing a stroller ? Shoveling snow ? Gardening ? Washing windows or floors  Patient will be able to explain general safety guidelines for exercising:   Before you start a new exercise program, talk with your health care provider.  Do not exercise so much that you hurt yourself, feel dizzy, or get very short of breath.  Wear comfortable clothes and wear shoes with good support.  Drink plenty of water while you exercise to prevent dehydration or heat stroke.  Work out until your breathing and your heartbeat get faster.       Personalized Health Maintenance & Screening Recommendations  Bone densitometry screening  Lung Cancer Screening Recommended: no (Low Dose CT Chest recommended if Age 55-80 years, 30 pack-year currently smoking OR have quit w/in past 15 years) Hepatitis C Screening recommended: no HIV Screening recommended: no  Advanced Directives: Written information was not prepared per patient's request.  Referrals & Orders No orders of the defined types were placed   points  Total Score 0   (Normal:0-7, Significant for Dysfunction: >8)  Normal Cognitive  Function Screening: Yes   Immunization & Health Maintenance Record Immunization History  Administered Date(s) Administered  . Fluad Quad(high Dose 65+) 11/02/2018  . Influenza, High Dose Seasonal PF 11/07/2017  . Influenza,inj,Quad PF,6+ Mos 10/31/2012, 11/14/2014, 11/03/2016  . Influenza-Unspecified 10/25/2013, 11/03/2016  . Pneumococcal Conjugate-13 11/02/2018  . Pneumococcal Polysaccharide-23 10/31/2012, 05/03/2017  . Tdap 08/18/2017  . Zoster 01/08/2013  . Zoster Recombinat (Shingrix) 08/18/2017, 11/07/2017    Health Maintenance  Topic Date Due  . Hepatitis C Screening  11/02/2019 (Originally 06/03/1950)  . HEMOGLOBIN A1C  05/02/2019  . OPHTHALMOLOGY EXAM  09/14/2019  . FOOT EXAM  11/02/2019  . MAMMOGRAM  12/27/2019  . COLONOSCOPY  03/22/2020  . TETANUS/TDAP  08/19/2027  . INFLUENZA VACCINE  Completed  . DEXA SCAN  Completed  . PNA vac Low Risk Adult  Completed       Assessment  This is a routine wellness examination for Ashley [name].  Health Maintenance: Due or Overdue There are no preventive care reminders to display for this patient.  Ashley [name] does not need a referral for Community Assistance: Care Management:   no Social Work:    no Prescription Assistance:  no Nutrition/Diabetes Education:  no   Plan:  Personalized Goals Goals Addressed            This Visit's Progress   . Patient Stated       04/16/2019 AWV Goal: Exercise for General Health   Patient will verbalize understanding of the benefits of increased physical activity:  Exercising regularly is important. It will improve your overall fitness, flexibility, and endurance.  Regular exercise also will improve your overall health. It can help you control your weight, reduce stress, and improve your bone density.  Over the next year, patient will increase physical activity as tolerated with a goal of at least 150 minutes of moderate physical activity per week.   You can tell that you are  exercising at a moderate intensity if your heart starts beating faster and you start breathing faster but can still hold a conversation.  Moderate-intensity exercise ideas include:  Walking 1 mile (1.6 km) in about 15 minutes  Biking  Hiking  Golfing  Dancing  Water aerobics  Patient will verbalize understanding of everyday activities that increase physical activity by providing examples like the following: ? Yard work, such as: ? Pushing a lawn mower ? Raking and bagging leaves ? Washing your car ? Pushing a stroller ? Shoveling snow ? Gardening ? Washing windows or floors  Patient will be able to explain general safety guidelines for exercising:   Before you start a new exercise program, talk with your health care provider.  Do not exercise so much that you hurt yourself, feel dizzy, or get very short of breath.  Wear comfortable clothes and wear shoes with good support.  Drink plenty of water while you exercise to prevent dehydration or heat stroke.  Work out until your breathing and your heartbeat get faster.       Personalized Health Maintenance & Screening Recommendations  Bone densitometry screening  Lung Cancer Screening Recommended: no (Low Dose CT Chest recommended if Age 55-80 years, 30 pack-year currently smoking OR have quit w/in past 15 years) Hepatitis C Screening recommended: no HIV Screening recommended: no  Advanced Directives: Written information was not prepared per patient's request.  Referrals & Orders No orders of the defined types were placed   points  Total Score 0   (Normal:0-7, Significant for Dysfunction: >8)  Normal Cognitive  Function Screening: Yes   Immunization & Health Maintenance Record Immunization History  Administered Date(s) Administered  . Fluad Quad(high Dose 65+) 11/02/2018  . Influenza, High Dose Seasonal PF 11/07/2017  . Influenza,inj,Quad PF,6+ Mos 10/31/2012, 11/14/2014, 11/03/2016  . Influenza-Unspecified 10/25/2013, 11/03/2016  . Pneumococcal Conjugate-13 11/02/2018  . Pneumococcal Polysaccharide-23 10/31/2012, 05/03/2017  . Tdap 08/18/2017  . Zoster 01/08/2013  . Zoster Recombinat (Shingrix) 08/18/2017, 11/07/2017    Health Maintenance  Topic Date Due  . Hepatitis C Screening  11/02/2019 (Originally 09/30/1950)  . HEMOGLOBIN A1C  05/02/2019  . OPHTHALMOLOGY EXAM  09/14/2019  . FOOT EXAM  11/02/2019  . MAMMOGRAM  12/27/2019  . COLONOSCOPY  03/22/2020  . TETANUS/TDAP  08/19/2027  . INFLUENZA VACCINE  Completed  . DEXA SCAN  Completed  . PNA vac Low Risk Adult  Completed       Assessment  This is a routine wellness examination for Ashley [name].  Health Maintenance: Due or Overdue There are no preventive care reminders to display for this patient.  Ashley [name] does not need a referral for Community Assistance: Care Management:   no Social Work:    no Prescription Assistance:  no Nutrition/Diabetes Education:  no   Plan:  Personalized Goals Goals Addressed            This Visit's Progress   . Patient Stated       04/16/2019 AWV Goal: Exercise for General Health   Patient will verbalize understanding of the benefits of increased physical activity:  Exercising regularly is important. It will improve your overall fitness, flexibility, and endurance.  Regular exercise also will improve your overall health. It can help you control your weight, reduce stress, and improve your bone density.  Over the next year, patient will increase physical activity as tolerated with a goal of at least 150 minutes of moderate physical activity per week.   You can tell that you are  exercising at a moderate intensity if your heart starts beating faster and you start breathing faster but can still hold a conversation.  Moderate-intensity exercise ideas include:  Walking 1 mile (1.6 km) in about 15 minutes  Biking  Hiking  Golfing  Dancing  Water aerobics  Patient will verbalize understanding of everyday activities that increase physical activity by providing examples like the following: ? Yard work, such as: ? Pushing a lawn mower ? Raking and bagging leaves ? Washing your car ? Pushing a stroller ? Shoveling snow ? Gardening ? Washing windows or floors  Patient will be able to explain general safety guidelines for exercising:   Before you start a new exercise program, talk with your health care provider.  Do not exercise so much that you hurt yourself, feel dizzy, or get very short of breath.  Wear comfortable clothes and wear shoes with good support.  Drink plenty of water while you exercise to prevent dehydration or heat stroke.  Work out until your breathing and your heartbeat get faster.       Personalized Health Maintenance & Screening Recommendations  Bone densitometry screening  Lung Cancer Screening Recommended: no (Low Dose CT Chest recommended if Age 55-80 years, 30 pack-year currently smoking OR have quit w/in past 15 years) Hepatitis C Screening recommended: no HIV Screening recommended: no  Advanced Directives: Written information was not prepared per patient's request.  Referrals & Orders No orders of the defined types were placed

## 2019-05-16 ENCOUNTER — Encounter: Payer: Self-pay | Admitting: Nurse Practitioner

## 2019-05-16 ENCOUNTER — Ambulatory Visit (INDEPENDENT_AMBULATORY_CARE_PROVIDER_SITE_OTHER): Payer: Medicare HMO | Admitting: Nurse Practitioner

## 2019-05-16 DIAGNOSIS — F411 Generalized anxiety disorder: Secondary | ICD-10-CM | POA: Diagnosis not present

## 2019-05-16 DIAGNOSIS — R609 Edema, unspecified: Secondary | ICD-10-CM | POA: Diagnosis not present

## 2019-05-16 DIAGNOSIS — E7800 Pure hypercholesterolemia, unspecified: Secondary | ICD-10-CM | POA: Diagnosis not present

## 2019-05-16 DIAGNOSIS — E119 Type 2 diabetes mellitus without complications: Secondary | ICD-10-CM

## 2019-05-16 DIAGNOSIS — I4819 Other persistent atrial fibrillation: Secondary | ICD-10-CM | POA: Diagnosis not present

## 2019-05-16 DIAGNOSIS — R600 Localized edema: Secondary | ICD-10-CM

## 2019-05-16 DIAGNOSIS — I10 Essential (primary) hypertension: Secondary | ICD-10-CM

## 2019-05-16 DIAGNOSIS — E785 Hyperlipidemia, unspecified: Secondary | ICD-10-CM | POA: Diagnosis not present

## 2019-05-16 MED ORDER — FUROSEMIDE 20 MG PO TABS: 20.0000 mg | ORAL_TABLET | Freq: Every day | ORAL | 1 refills | Status: DC

## 2019-05-16 MED ORDER — ALPRAZOLAM 0.25 MG PO TABS: 0.2500 mg | ORAL_TABLET | Freq: Every day | ORAL | 2 refills | Status: DC | PRN

## 2019-05-16 MED ORDER — METFORMIN HCL 1000 MG PO TABS: 1000.0000 mg | ORAL_TABLET | Freq: Two times a day (BID) | ORAL | 1 refills | Status: DC

## 2019-05-16 MED ORDER — SIMVASTATIN 40 MG PO TABS: ORAL_TABLET | ORAL | 1 refills | Status: DC

## 2019-05-16 MED ORDER — CLONIDINE HCL 0.2 MG PO TABS: 0.2000 mg | ORAL_TABLET | Freq: Two times a day (BID) | ORAL | 1 refills | Status: DC

## 2019-05-16 MED ORDER — QUINAPRIL HCL 40 MG PO TABS: 40.0000 mg | ORAL_TABLET | Freq: Every day | ORAL | 1 refills | Status: DC

## 2019-05-16 MED ORDER — GLIPIZIDE 5 MG PO TABS: 5.0000 mg | ORAL_TABLET | Freq: Two times a day (BID) | ORAL | 1 refills | Status: DC

## 2019-05-16 MED ORDER — METOPROLOL TARTRATE 50 MG PO TABS: 50.0000 mg | ORAL_TABLET | Freq: Two times a day (BID) | ORAL | 1 refills | Status: DC

## 2019-05-16 MED ORDER — AMLODIPINE BESYLATE 5 MG PO TABS: 5.0000 mg | ORAL_TABLET | Freq: Every day | ORAL | 1 refills | Status: DC

## 2019-05-16 MED ORDER — APIXABAN 5 MG PO TABS: 5.0000 mg | ORAL_TABLET | Freq: Two times a day (BID) | ORAL | 1 refills | Status: DC

## 2019-05-16 NOTE — Progress Notes (Signed)
Virtual Visit via telephone Note Due to COVID-19 pandemic this visit was conducted virtually. This visit type was conducted due to national recommendations for restrictions regarding the COVID-19 Pandemic (e.g. social distancing, sheltering in place) in an effort to limit this patient's exposure and mitigate transmission in our community. All issues noted in this document were discussed and addressed.  A physical exam was not performed with this format.  I connected with Ashley [name] on 05/16/19 at 10:55 by telephone and verified that I am speaking with the correct person using two identifiers. Ashley [name] is currently located at home and  No one is currently with her during visit. The provider, Mary-Margaret Hassell Done, FNP is located in their office at time of visit.  I discussed the limitations, risks, security and privacy concerns of performing an evaluation and management service by telephone and the availability of in person appointments. I also discussed with the patient that there may be a patient responsible charge related to this service. The patient expressed understanding and agreed to proceed.   History and Present Illness:   Chief Complaint: Medical Management of Chronic Issues    HPI:  1. Essential hypertension No c/o chest pain, sob or headache. Does not check blood pressure at home. BP Readings from Last 3 Encounters:  11/02/18 120/73  10/25/18 (!) 157/65  06/27/18 126/74     2. Hyperlipidemia, unspecified hyperlipidemia type Dos not watch diet and does very little exercise. Lab Results  Component Value Date   CHOL 115 11/02/2018   HDL 39 (L) 11/02/2018   LDLCALC 58 11/02/2018   TRIG 90 11/02/2018   CHOLHDL 2.9 11/02/2018     3. Type 2 diabetes mellitus without complication, without long-term current use of insulin (HCC) She does not check blood sugars very often. She has no symptoms of low blood sugar Lab Results  Component Value Date   HGBA1C 7.0 (H)  11/02/2018     4. Persistent atrial fibrillation (HCC) denies any chest palpitations. No racing heart rate.  5. Peripheral edema Has swelling by end of each day, but resolves at night.  6. Severe obesity (BMI >= 40) (HCC) No recent weight changes Wt Readings from Last 3 Encounters:  11/02/18 260 lb (117.9 kg)  10/25/18 245 lb (111.1 kg)  06/27/18 263 lb (119.3 kg)   BMI Readings from Last 3 Encounters:  11/02/18 47.55 kg/m  10/25/18 44.10 kg/m  06/27/18 48.10 kg/m       Outpatient Encounter Medications as of 05/16/2019  Medication Sig  . Accu-Chek Softclix Lancets lancets TEST BLOOD SUGAR TWICE DAILY Dx E11.9  . acetaminophen (TYLENOL) 500 MG tablet Take 1,000 mg by mouth every 6 (six) hours as needed (for pain/headaches.).  Marland Kitchen Alcohol Swabs (B-D SINGLE USE SWABS REGULAR) PADS CHECK BLOOD SUGAR TWICE DAILY Dx e11.9  . ALPRAZolam (XANAX) 0.25 MG tablet Take 1 tablet (0.25 mg total) by mouth daily as needed for anxiety.  Marland Kitchen amLODipine (NORVASC) 5 MG tablet Take 1 tablet (5 mg total) by mouth daily.  Marland Kitchen apixaban (ELIQUIS) 5 MG TABS tablet Take 1 tablet (5 mg total) by mouth 2 (two) times daily.  . Blood Glucose Monitoring Suppl (ACCU-CHEK AVIVA PLUS) w/Device KIT USE AS DIRECTED  . Blood Glucose Monitoring Suppl (ACCU-CHEK GUIDE ME) w/Device KIT 1 kit by Does not apply route 2 (two) times daily.  Marland Kitchen CINNAMON PO Take 1 capsule by mouth daily.   . cloNIDine (CATAPRES) 0.2 MG tablet Take 1 tablet (0.2 mg total) by mouth 2 (  two) times daily.  . clotrimazole-betamethasone (LOTRISONE) cream Apply 1 application topically 2 (two) times daily.  . furosemide (LASIX) 20 MG tablet Take 1 tablet (20 mg total) by mouth daily.  . Ginger, Zingiber officinalis, (GINGER ROOT PO) Take 1 capsule by mouth daily.   Marland Kitchen glipiZIDE (GLUCOTROL) 5 MG tablet Take 1 tablet (5 mg total) by mouth 2 (two) times daily.  Marland Kitchen glucose blood (ACCU-CHEK AVIVA PLUS) test strip Test blood sugar twice daily Dx E11.9  .  metFORMIN (GLUCOPHAGE) 1000 MG tablet Take 1 tablet (1,000 mg total) by mouth 2 (two) times daily with a meal.  . metoprolol tartrate (LOPRESSOR) 50 MG tablet Take 1 tablet (50 mg total) by mouth 2 (two) times daily.  Marland Kitchen nystatin cream (MYCOSTATIN) Apply 1 application topically 2 (two) times daily.  . quinapril (ACCUPRIL) 40 MG tablet Take 1 tablet (40 mg total) by mouth daily.  . simvastatin (ZOCOR) 40 MG tablet TAKE 1 TABLET EVERY DAY IN THE EVENING  . Turmeric Curcumin 500 MG CAPS Take 500 mg by mouth daily.      Past Surgical History:  Procedure Laterality Date  . CYST EXCISION Left 03/15/2017   from buttocks  . ORIF ANKLE FRACTURE Left 04/20/2017   Procedure: OPEN REDUCTION INTERNAL FIXATION (ORIF) LEFT ANKLE FRACTURE;  Surgeon: Netta Cedars, MD;  Location: [facility];  Service: Orthopedics;  Laterality: Left;    Family History  Problem Relation Age of Onset  . Diabetes Mother   . CAD Mother        CABG at age 16  . Hypertension Mother   . Hyperlipidemia Mother   . Diabetes Father   . Heart disease Father        CHF  . Hypertension Father   . Diabetes Sister   . CAD Sister   . Hypertension Sister   . Diabetes Brother   . Hypertension Brother   . Healthy Sister     New complaints: None today  Social history: Lives by herself. Has a daughter that checks on her daily  Controlled substance contract: n/a    Review of Systems  Constitutional: Negative for diaphoresis and weight loss.  Eyes: Negative for blurred vision, double vision and pain.  Respiratory: Negative for shortness of breath.   Cardiovascular: Negative for chest pain, palpitations, orthopnea and leg swelling.  Gastrointestinal: Negative for abdominal pain.  Skin: Negative for rash.  Neurological: Negative for dizziness, sensory change, loss of consciousness, weakness and headaches.  Endo/Heme/Allergies: Negative for polydipsia. Does not bruise/bleed easily.  Psychiatric/Behavioral: Negative for memory  loss. The patient does not have insomnia.   All other systems reviewed and are negative.    Observations/Objective: Alert and oriented- answers all questions appropriately No distress    Assessment and Plan: Ashley [name] comes in today with chief complaint of Medical Management of Chronic Issues   Diagnosis and orders addressed:  1. Essential hypertension Low sodium diet - amLODipine (NORVASC) 5 MG tablet; Take 1 tablet (5 mg total) by mouth daily.  Dispense: 90 tablet; Refill: 1 - quinapril (ACCUPRIL) 40 MG tablet; Take 1 tablet (40 mg total) by mouth daily.  Dispense: 90 tablet; Refill: 1 - cloNIDine (CATAPRES) 0.2 MG tablet; Take 1 tablet (0.2 mg total) by mouth 2 (two) times daily.  Dispense: 180 tablet; Refill: 1 - CBC with Differential/Platelet - CMP14+EGFR  2. Hyperlipidemia, unspecified hyperlipidemia type Low fat diet - Lipid panel  3. Type 2 diabetes mellitus without complication, without long-term current use of insulin ([place])  contniue to watch carbsin diet - glipiZIDE (GLUCOTROL) 5 MG tablet; Take 1 tablet (5 mg total) by mouth 2 (two) times daily.  Dispense: 180 tablet; Refill: 1 - metFORMIN (GLUCOPHAGE) 1000 MG tablet; Take 1 tablet (1,000 mg total) by mouth 2 (two) times daily with a meal.  Dispense: 180 tablet; Refill: 1 - Bayer DCA Hb A1c Waived  4. Persistent atrial fibrillation (HCC) Avoid over use of caffeine - metoprolol tartrate (LOPRESSOR) 50 MG tablet; Take 1 tablet (50 mg total) by mouth 2 (two) times daily.  Dispense: 180 tablet; Refill: 1 - apixaban (ELIQUIS) 5 MG TABS tablet; Take 1 tablet (5 mg total) by mouth 2 (two) times daily.  Dispense: 180 tablet; Refill: 1  5. Peripheral edema Elevate legs when sitting - furosemide (LASIX) 20 MG tablet; Take 1 tablet (20 mg total) by mouth daily.  Dispense: 90 tablet; Refill: 1  6. Severe obesity (BMI >= 40) (HCC) Discussed diet and exercise for person with BMI >25 Will recheck weight in 3-6 months  7.  GAD (generalized anxiety disorder) Stress management - ALPRAZolam (XANAX) 0.25 MG tablet; Take 1 tablet (0.25 mg total) by mouth daily as needed for anxiety.  Dispense: 30 tablet; Refill: 2  8. Pure hypercholesterolemia Low fat diet - simvastatin (ZOCOR) 40 MG tablet; TAKE 1 TABLET EVERY DAY IN THE EVENING  Dispense: 90 tablet; Refill: 1   Labs pending Health Maintenance reviewed Diet and exercise encouraged  Follow up plan: 3 months     I discussed the assessment and treatment plan with the patient. The patient was provided an opportunity to ask questions and all were answered. The patient agreed with the plan and demonstrated an understanding of the instructions.   The patient was advised to call back or seek an in-person evaluation if the symptoms worsen or if the condition fails to improve as anticipated.  The above assessment and management plan was discussed with the patient. The patient verbalized understanding of and has agreed to the management plan. Patient is aware to call the clinic if symptoms persist or worsen. Patient is aware when to return to the clinic for a follow-up visit. Patient educated on when it is appropriate to go to the emergency department.   Time call ended:  11:10  I provided 15 minutes of non-face-to-face time during this encounter.    Mary-Margaret Hassell Done, FNP

## 2019-05-22 ENCOUNTER — Other Ambulatory Visit: Payer: Medicare HMO

## 2019-05-22 ENCOUNTER — Other Ambulatory Visit: Payer: Self-pay | Admitting: Nurse Practitioner

## 2019-05-22 ENCOUNTER — Other Ambulatory Visit: Payer: Self-pay

## 2019-05-22 DIAGNOSIS — E119 Type 2 diabetes mellitus without complications: Secondary | ICD-10-CM | POA: Diagnosis not present

## 2019-05-22 DIAGNOSIS — L209 Atopic dermatitis, unspecified: Secondary | ICD-10-CM

## 2019-05-22 DIAGNOSIS — I10 Essential (primary) hypertension: Secondary | ICD-10-CM | POA: Diagnosis not present

## 2019-05-22 DIAGNOSIS — E785 Hyperlipidemia, unspecified: Secondary | ICD-10-CM | POA: Diagnosis not present

## 2019-05-22 LAB — BAYER DCA HB A1C WAIVED: HB A1C (BAYER DCA - WAIVED): 8 % — ABNORMAL HIGH (ref <7.0)

## 2019-05-23 LAB — CMP14+EGFR
ALT: 25 IU/L (ref 0–32)
AST: 36 IU/L (ref 0–40)
Albumin/Globulin Ratio: 1.4 (ref 1.2–2.2)
Albumin: 4.2 g/dL (ref 3.8–4.8)
Alkaline Phosphatase: 62 IU/L (ref 39–117)
BUN/Creatinine Ratio: 30 — ABNORMAL HIGH (ref 12–28)
BUN: 17 mg/dL (ref 8–27)
Bilirubin Total: 0.7 mg/dL (ref 0.0–1.2)
CO2: 24 mmol/L (ref 20–29)
Calcium: 9.4 mg/dL (ref 8.7–10.3)
Chloride: 100 mmol/L (ref 96–106)
Creatinine, Ser: 0.57 mg/dL (ref 0.57–1.00)
GFR calc Af Amer: 111 mL/min/{1.73_m2} (ref >59)
GFR calc non Af Amer: 96 mL/min/{1.73_m2} (ref >59)
Globulin, Total: 3 g/dL (ref 1.5–4.5)
Glucose: 165 mg/dL — ABNORMAL HIGH (ref 65–99)
Potassium: 4.1 mmol/L (ref 3.5–5.2)
Sodium: 140 mmol/L (ref 134–144)
Total Protein: 7.2 g/dL (ref 6.0–8.5)

## 2019-05-23 LAB — CBC WITH DIFFERENTIAL/PLATELET
Basophils Absolute: 0.1 10*3/uL (ref 0.0–0.2)
Basos: 1 %
EOS (ABSOLUTE): 0.1 10*3/uL (ref 0.0–0.4)
Eos: 2 %
Hematocrit: 42.5 % (ref 34.0–46.6)
Hemoglobin: 14.1 g/dL (ref 11.1–15.9)
Immature Grans (Abs): 0 10*3/uL (ref 0.0–0.1)
Immature Granulocytes: 0 %
Lymphocytes Absolute: 2.8 10*3/uL (ref 0.7–3.1)
Lymphs: 34 %
MCH: 31 pg (ref 26.6–33.0)
MCHC: 33.2 g/dL (ref 31.5–35.7)
MCV: 93 fL (ref 79–97)
Monocytes Absolute: 0.7 10*3/uL (ref 0.1–0.9)
Monocytes: 8 %
Neutrophils Absolute: 4.7 10*3/uL (ref 1.4–7.0)
Neutrophils: 55 %
Platelets: 248 10*3/uL (ref 150–450)
RBC: 4.55 x10E6/uL (ref 3.77–5.28)
RDW: 13.5 % (ref 11.7–15.4)
WBC: 8.4 10*3/uL (ref 3.4–10.8)

## 2019-05-23 LAB — LIPID PANEL
Chol/HDL Ratio: 2.9 ratio (ref 0.0–4.4)
Cholesterol, Total: 128 mg/dL (ref 100–199)
HDL: 44 mg/dL (ref >39)
LDL Chol Calc (NIH): 63 mg/dL (ref 0–99)
Triglycerides: 118 mg/dL (ref 0–149)
VLDL Cholesterol Cal: 21 mg/dL (ref 5–40)

## 2019-06-14 ENCOUNTER — Ambulatory Visit (INDEPENDENT_AMBULATORY_CARE_PROVIDER_SITE_OTHER): Payer: Medicare HMO | Admitting: Nurse Practitioner

## 2019-06-14 ENCOUNTER — Ambulatory Visit (INDEPENDENT_AMBULATORY_CARE_PROVIDER_SITE_OTHER): Payer: Medicare HMO

## 2019-06-14 ENCOUNTER — Other Ambulatory Visit: Payer: Self-pay

## 2019-06-14 ENCOUNTER — Encounter: Payer: Self-pay | Admitting: Nurse Practitioner

## 2019-06-14 VITALS — BP 135/80 | HR 57 | Temp 97.6°F | Resp 20 | Ht 62.0 in | Wt 271.0 lb

## 2019-06-14 DIAGNOSIS — M25551 Pain in right hip: Secondary | ICD-10-CM

## 2019-06-14 MED ORDER — MELOXICAM 15 MG PO TABS: 15.0000 mg | ORAL_TABLET | Freq: Every day | ORAL | 0 refills | Status: DC

## 2019-06-14 MED ORDER — METHYLPREDNISOLONE ACETATE 80 MG/ML IJ SUSP
80.0000 mg | Freq: Once | INTRAMUSCULAR | Status: AC
  Administered 2019-06-14: 80 mg via INTRAMUSCULAR

## 2019-06-14 NOTE — Patient Instructions (Signed)
Hip Pain The hip is the joint between the upper legs and the lower pelvis. The bones, cartilage, tendons, and muscles of your hip joint support your body and allow you to move around. Hip pain can range from a minor ache to severe pain in one or both of your hips. The pain may be felt on the inside of the hip joint near the groin, or on the outside near the buttocks and upper thigh. You may also have swelling or stiffness in your hip area. Follow these instructions at home: Managing pain, stiffness, and swelling      If directed, put ice on the painful area. To do this: ? Put ice in a plastic bag. ? Place a towel between your skin and the bag. ? Leave the ice on for 20 minutes, 2-3 times a day.  If directed, apply heat to the affected area as often as told by your health care provider. Use the heat source that your health care provider recommends, such as a moist heat pack or a heating pad. ? Place a towel between your skin and the heat source. ? Leave the heat on for 20-30 minutes. ? Remove the heat if your skin turns bright red. This is especially important if you are unable to feel pain, heat, or cold. You may have a greater risk of getting burned. Activity  Do exercises as told by your health care provider.  Avoid activities that cause pain. General instructions   Take over-the-counter and prescription medicines only as told by your health care provider.  Keep a journal of your symptoms. Write down: ? How often you have hip pain. ? The location of your pain. ? What the pain feels like. ? What makes the pain worse.  Sleep with a pillow between your legs on your most comfortable side.  Keep all follow-up visits as told by your health care provider. This is important. Contact a health care provider if:  You cannot put weight on your leg.  Your pain or swelling continues or gets worse after one week.  It gets harder to walk.  You have a fever. Get help right away  if:  You fall.  You have a sudden increase in pain and swelling in your hip.  Your hip is red or swollen or very tender to touch. Summary  Hip pain can range from a minor ache to severe pain in one or both of your hips.  The pain may be felt on the inside of the hip joint near the groin, or on the outside near the buttocks and upper thigh.  Avoid activities that cause pain.  Write down how often you have hip pain, the location of the pain, what makes it worse, and what it feels like. This information is not intended to replace advice given to you by your health care provider. Make sure you discuss any questions you have with your health care provider. Document Revised: 06/11/2018 Document Reviewed: 06/11/2018 Elsevier Patient Education  2020 [employer] -- 

## 2019-06-14 NOTE — Progress Notes (Signed)
   Subjective:    Patient ID: Ashley [name], female    DOB: 01-14-1951, 69 y.o.   MRN: [id number]   Chief Complaint: Right leg and hip pain   HPI Patient comes in c/o right hip pain that radiates down side of leg. 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 It is making her have trouble walking. Rate pain 6-9/10. Resting make pain better. Heating pad also helps some.  Review of Systems  Constitutional: Negative for diaphoresis.  Eyes: Negative for pain.  Respiratory: Negative for shortness of breath.   Cardiovascular: Negative for chest pain, palpitations and leg swelling.  Gastrointestinal: Negative for abdominal pain.  Endocrine: Negative for polydipsia.  Skin: Negative for rash.  Neurological: Negative for dizziness, weakness and headaches.  Hematological: Does not bruise/bleed easily.  All other systems reviewed and are negative.      Objective:   Physical Exam Vitals and nursing note reviewed.  Constitutional:      Appearance: Normal appearance.  Cardiovascular:     Rate and Rhythm: Normal rate and regular rhythm.     Heart sounds: Normal heart sounds.  Pulmonary:     Breath sounds: Normal breath sounds.  Musculoskeletal:     Comments: Rises slowly from sitting to standing pain on palpation of right hip. FROM with pain on extenion  Skin:    General: Skin is warm.  Neurological:     General: No focal deficit present.     Mental Status: She is alert and oriented to person, place, and time.  Psychiatric:        Mood and Affect: Mood normal.        Behavior: Behavior normal.    BP 135/80   Pulse (!) 57   Temp 97.6 F (36.4 C) (Temporal)   Resp 20   Ht 5\' 2"  (1.575 m)   Wt 271 lb (122.9 kg)   SpO2 99%   BMI 49.57 kg/m   Hip xray- osteoarthritis of right hip.-Preliminary reading by Ronnald Collum, FNP  [facility]       Assessment & Plan:  Ashley [name] in today with chief complaint of Right leg and hip pain   1. Right hip pain moist  heat  rest If no better next week will do ortho referral - DG HIP UNILAT W OR W/O PELVIS 2-3 VIEWS RIGHT; Future - meloxicam (MOBIC) 15 MG tablet; Take 1 tablet (15 mg total) by mouth daily.  Dispense: 30 tablet; Refill: 0 - methylPREDNISolone acetate (DEPO-MEDROL) injection 80 mg    The above assessment and management plan was discussed with the patient. The patient verbalized understanding of and has agreed to the management plan. Patient is aware to call the clinic if symptoms persist or worsen. Patient is aware when to return to the clinic for a follow-up visit. Patient educated on when it is appropriate to go to the emergency department.   Mary-Margaret Hassell Done, FNP

## 2019-07-07 ENCOUNTER — Other Ambulatory Visit: Payer: Self-pay | Admitting: Nurse Practitioner

## 2019-07-07 DIAGNOSIS — M25551 Pain in right hip: Secondary | ICD-10-CM

## 2019-07-30 ENCOUNTER — Telehealth: Payer: Self-pay | Admitting: Nurse Practitioner

## 2019-07-30 NOTE — Telephone Encounter (Signed)
LVM to call and schedule diabetic eye exam.  

## 2019-08-15 ENCOUNTER — Ambulatory Visit: Payer: Self-pay | Admitting: Nurse Practitioner

## 2019-09-12 LAB — HM DIABETES EYE EXAM

## 2019-10-07 ENCOUNTER — Other Ambulatory Visit: Payer: Self-pay

## 2019-10-07 ENCOUNTER — Ambulatory Visit (INDEPENDENT_AMBULATORY_CARE_PROVIDER_SITE_OTHER): Payer: Medicare HMO | Admitting: Nurse Practitioner

## 2019-10-07 ENCOUNTER — Encounter: Payer: Self-pay | Admitting: Nurse Practitioner

## 2019-10-07 VITALS — BP 141/77 | HR 81 | Temp 98.2°F | Resp 20 | Ht 62.0 in | Wt 266.0 lb

## 2019-10-07 DIAGNOSIS — I4819 Other persistent atrial fibrillation: Secondary | ICD-10-CM | POA: Diagnosis not present

## 2019-10-07 DIAGNOSIS — E119 Type 2 diabetes mellitus without complications: Secondary | ICD-10-CM | POA: Diagnosis not present

## 2019-10-07 DIAGNOSIS — E782 Mixed hyperlipidemia: Secondary | ICD-10-CM

## 2019-10-07 DIAGNOSIS — I10 Essential (primary) hypertension: Secondary | ICD-10-CM

## 2019-10-07 DIAGNOSIS — R609 Edema, unspecified: Secondary | ICD-10-CM | POA: Diagnosis not present

## 2019-10-07 DIAGNOSIS — E7800 Pure hypercholesterolemia, unspecified: Secondary | ICD-10-CM | POA: Diagnosis not present

## 2019-10-07 DIAGNOSIS — F411 Generalized anxiety disorder: Secondary | ICD-10-CM

## 2019-10-07 LAB — BAYER DCA HB A1C WAIVED: HB A1C (BAYER DCA - WAIVED): 7.1 % — ABNORMAL HIGH (ref <7.0)

## 2019-10-07 MED ORDER — ALPRAZOLAM 0.25 MG PO TABS: 0.2500 mg | ORAL_TABLET | Freq: Every day | ORAL | 2 refills | Status: DC | PRN

## 2019-10-07 MED ORDER — AMLODIPINE BESYLATE 5 MG PO TABS: 5.0000 mg | ORAL_TABLET | Freq: Every day | ORAL | 1 refills | Status: DC

## 2019-10-07 MED ORDER — SIMVASTATIN 40 MG PO TABS: ORAL_TABLET | ORAL | 1 refills | Status: DC

## 2019-10-07 MED ORDER — METFORMIN HCL 1000 MG PO TABS: 1000.0000 mg | ORAL_TABLET | Freq: Two times a day (BID) | ORAL | 1 refills | Status: DC

## 2019-10-07 MED ORDER — QUINAPRIL HCL 40 MG PO TABS: 40.0000 mg | ORAL_TABLET | Freq: Every day | ORAL | 1 refills | Status: DC

## 2019-10-07 MED ORDER — METOPROLOL TARTRATE 50 MG PO TABS: 50.0000 mg | ORAL_TABLET | Freq: Two times a day (BID) | ORAL | 1 refills | Status: DC

## 2019-10-07 MED ORDER — FUROSEMIDE 20 MG PO TABS: 20.0000 mg | ORAL_TABLET | Freq: Every day | ORAL | 1 refills | Status: DC

## 2019-10-07 MED ORDER — APIXABAN 5 MG PO TABS: 5.0000 mg | ORAL_TABLET | Freq: Two times a day (BID) | ORAL | 1 refills | Status: DC

## 2019-10-07 MED ORDER — CLONIDINE HCL 0.2 MG PO TABS: 0.2000 mg | ORAL_TABLET | Freq: Two times a day (BID) | ORAL | 1 refills | Status: DC

## 2019-10-07 MED ORDER — GLIPIZIDE 5 MG PO TABS: 5.0000 mg | ORAL_TABLET | Freq: Two times a day (BID) | ORAL | 1 refills | Status: DC

## 2019-10-07 NOTE — Progress Notes (Signed)
Subjective:    Patient ID: Ashley [name], female    DOB: [birth year], 69 y.o.   MRN: [id number]   Chief Complaint: Medical Management of Chronic Issues    HPI:  1. Essential hypertension No c/o chest pain, sob or headache. Does not check blood pressure at home. BP Readings from Last 3 Encounters:  06/14/19 135/80  11/02/18 120/73  10/25/18 (!) 157/65     2. Mixed hyperlipidemia Does not watch diet and does no exercise. Lab Results  Component Value Date   CHOL 128 05/22/2019   HDL 44 05/22/2019   LDLCALC 63 05/22/2019   TRIG 118 05/22/2019   CHOLHDL 2.9 05/22/2019     3. Peripheral edema Is on lasix daily and it does well to keep edema down.  4. Persistent atrial fibrillation ([place]) Is on eliquis and is dong well. No c/o palpitations or heart racing.  5. Severe obesity (BMI >= 40) (HCC) Weight is up 11lbs from last visit Wt Readings from Last 3 Encounters:  06/14/19 271 lb (122.9 kg)  11/02/18 260 lb (117.9 kg)  10/25/18 245 lb (111.1 kg)   BMI Readings from Last 3 Encounters:  06/14/19 49.57 kg/m  11/02/18 47.55 kg/m  10/25/18 44.10 kg/m       Outpatient Encounter Medications as of 10/07/2019  Medication Sig  . Accu-Chek Softclix Lancets lancets TEST BLOOD SUGAR TWICE DAILY Dx E11.9  . acetaminophen (TYLENOL) 500 MG tablet Take 1,000 mg by mouth every 6 (six) hours as needed (for pain/headaches.).  Marland Kitchen Alcohol Swabs (B-D SINGLE USE SWABS REGULAR) PADS CHECK BLOOD SUGAR TWICE DAILY Dx e11.9  . ALPRAZolam (XANAX) 0.25 MG tablet Take 1 tablet (0.25 mg total) by mouth daily as needed for anxiety.  Marland Kitchen amLODipine (NORVASC) 5 MG tablet Take 1 tablet (5 mg total) by mouth daily.  Marland Kitchen apixaban (ELIQUIS) 5 MG TABS tablet Take 1 tablet (5 mg total) by mouth 2 (two) times daily.  . Blood Glucose Monitoring Suppl (ACCU-CHEK AVIVA PLUS) w/Device KIT USE AS DIRECTED  . Blood Glucose Monitoring Suppl (ACCU-CHEK GUIDE ME) w/Device KIT 1 kit by Does not apply route 2 (two)  times daily.  Marland Kitchen CINNAMON PO Take 1 capsule by mouth daily.   . cloNIDine (CATAPRES) 0.2 MG tablet Take 1 tablet (0.2 mg total) by mouth 2 (two) times daily.  . clotrimazole-betamethasone (LOTRISONE) cream APPLY TO AFFECTED AREA TWICE A DAY  . furosemide (LASIX) 20 MG tablet Take 1 tablet (20 mg total) by mouth daily.  . Ginger, Zingiber officinalis, (GINGER ROOT PO) Take 1 capsule by mouth daily.   Marland Kitchen glipiZIDE (GLUCOTROL) 5 MG tablet Take 1 tablet (5 mg total) by mouth 2 (two) times daily.  Marland Kitchen glucose blood (ACCU-CHEK AVIVA PLUS) test strip Test blood sugar twice daily Dx E11.9  . meloxicam (MOBIC) 15 MG tablet TAKE 1 TABLET BY MOUTH EVERY DAY  . metFORMIN (GLUCOPHAGE) 1000 MG tablet Take 1 tablet (1,000 mg total) by mouth 2 (two) times daily with a meal.  . metoprolol tartrate (LOPRESSOR) 50 MG tablet Take 1 tablet (50 mg total) by mouth 2 (two) times daily.  Marland Kitchen nystatin cream (MYCOSTATIN) Apply 1 application topically 2 (two) times daily.  . quinapril (ACCUPRIL) 40 MG tablet Take 1 tablet (40 mg total) by mouth daily.  . simvastatin (ZOCOR) 40 MG tablet TAKE 1 TABLET EVERY DAY IN THE EVENING  . Turmeric Curcumin 500 MG CAPS Take 500 mg by mouth daily.      Past Surgical History:  Procedure  Laterality Date  . CYST EXCISION Left 03/15/2017   from buttocks  . ORIF ANKLE FRACTURE Left 04/20/2017   Procedure: OPEN REDUCTION INTERNAL FIXATION (ORIF) LEFT ANKLE FRACTURE;  Surgeon: Netta Cedars, MD;  Location: [place];  Service: Orthopedics;  Laterality: Left;    Family History  Problem Relation Age of Onset  . Diabetes Mother   . CAD Mother        CABG at age 68  . Hypertension Mother   . Hyperlipidemia Mother   . Diabetes Father   . Heart disease Father        CHF  . Hypertension Father   . Diabetes Sister   . CAD Sister   . Hypertension Sister   . Diabetes Brother   . Hypertension Brother   . Healthy Sister     New complaints: None today  Social history: Lives by herself.  Her daughter checks on  Controlled substance contract: n/a    Review of Systems  Constitutional: Negative for diaphoresis.  Eyes: Negative for pain.  Respiratory: Negative for shortness of breath.   Cardiovascular: Negative for chest pain, palpitations and leg swelling.  Gastrointestinal: Negative for abdominal pain.  Endocrine: Negative for polydipsia.  Skin: Negative for rash.  Neurological: Negative for dizziness, weakness and headaches.  Hematological: Does not bruise/bleed easily.  All other systems reviewed and are negative.      Objective:   Physical Exam Vitals and nursing note reviewed.  Constitutional:      General: She is not in acute distress.    Appearance: Normal appearance. She is well-developed.  HENT:     Head: Normocephalic.     Nose: Nose normal.  Eyes:     Pupils: Pupils are equal, round, and reactive to light.  Neck:     Vascular: No carotid bruit or JVD.  Cardiovascular:     Rate and Rhythm: Normal rate and regular rhythm.     Heart sounds: Normal heart sounds.  Pulmonary:     Effort: Pulmonary effort is normal. No respiratory distress.     Breath sounds: Normal breath sounds. No wheezing or rales.  Chest:     Chest wall: No tenderness.  Abdominal:     General: Bowel sounds are normal. There is no distension or abdominal bruit.     Palpations: Abdomen is soft. There is no hepatomegaly, splenomegaly, mass or pulsatile mass.     Tenderness: There is no abdominal tenderness.  Musculoskeletal:        General: Normal range of motion.     Cervical back: Normal range of motion and neck supple.  Lymphadenopathy:     Cervical: No cervical adenopathy.  Skin:    General: Skin is warm and dry.  Neurological:     Mental Status: She is alert and oriented to person, place, and time.     Deep Tendon Reflexes: Reflexes are normal and symmetric.  Psychiatric:        Behavior: Behavior normal.        Thought Content: Thought content normal.         Judgment: Judgment normal.    BP (!) 141/77   Pulse 81   Temp 98.2 F (36.8 C) (Temporal)   Resp 20   Ht '5\' 2"'  (1.575 m)   Wt 266 lb (120.7 kg)   SpO2 96%   BMI 48.65 kg/m   hgba1c 7.1%      Assessment & Plan:  Adhya Cocco comes in today with chief complaint of Medical  Management of Chronic Issues   Diagnosis and orders addressed:  1. Essential hypertension Low sodium diet - CBC with Differential/Platelet - CMP14+EGFR - amLODipine (NORVASC) 5 MG tablet; Take 1 tablet (5 mg total) by mouth daily.  Dispense: 90 tablet; Refill: 1 - quinapril (ACCUPRIL) 40 MG tablet; Take 1 tablet (40 mg total) by mouth daily.  Dispense: 90 tablet; Refill: 1 - cloNIDine (CATAPRES) 0.2 MG tablet; Take 1 tablet (0.2 mg total) by mouth 2 (two) times daily.  Dispense: 180 tablet; Refill: 1  2. Mixed hyperlipidemia Low fat diet - Lipid panel- simvastatin (ZOCOR) 40 MG tablet; TAKE 1 TABLET EVERY DAY IN THE EVENING  Dispense: 90 tablet; Refill: 1  3. Peripheral edema Continue daily fluid pill Elevate legs when sitting - furosemide (LASIX) 20 MG tablet; Take 1 tablet (20 mg total) by mouth daily.  Dispense: 90 tablet; Refill: 1  4. Persistent atrial fibrillation (HCC) Avoid caffeine - metoprolol tartrate (LOPRESSOR) 50 MG tablet; Take 1 tablet (50 mg total) by mouth 2 (two) times daily.  Dispense: 180 tablet; Refill: 1 - apixaban (ELIQUIS) 5 MG TABS tablet; Take 1 tablet (5 mg total) by mouth 2 (two) times daily.  Dispense: 180 tablet; Refill: 1  5. Severe obesity (BMI >= 40) (HCC) Discussed diet and exercise for person with BMI >25 Will recheck weight in 3-6 months   6. Type 2 diabetes mellitus without complication, without long-term current use of insulin (HCC) Continue low carb diet - Bayer DCA Hb A1c Waived - glipiZIDE (GLUCOTROL) 5 MG tablet; Take 1 tablet (5 mg total) by mouth 2 (two) times daily.  Dispense: 180 tablet; Refill: 1 - metFORMIN (GLUCOPHAGE) 1000 MG tablet; Take 1 tablet  (1,000 mg total) by mouth 2 (two) times daily with a meal.  Dispense: 180 tablet; Refill: 1   7. GAD (generalized anxiety disorder) stress management - ALPRAZolam (XANAX) 0.25 MG tablet; Take 1 tablet (0.25 mg total) by mouth daily as needed for anxiety.  Dispense: 30 tablet; Refill: 2   Labs pending Health Maintenance reviewed Diet and exercise encouraged  Follow up plan: 6 months   Mary-Margaret Hassell Done, FNP

## 2019-10-07 NOTE — Patient Instructions (Signed)
Edema  Edema is when you have too much fluid in your body or under your skin. Edema may make your legs, feet, and ankles swell up. Swelling is also common in looser tissues, like around your eyes. This is a common condition. It gets more common as you get older. There are many possible causes of edema. Eating too much salt (sodium) and being on your feet or sitting for a long time can cause edema in your legs, feet, and ankles. Hot weather may make edema worse. Edema is usually painless. Your skin may look swollen or shiny. Follow these instructions at home:  Keep the swollen body part raised (elevated) above the level of your heart when you are sitting or lying down.  Do not sit still or stand for a long time.  Do not wear tight clothes. Do not wear garters on your upper legs.  Exercise your legs. This can help the swelling go down.  Wear elastic bandages or support stockings as told by your doctor.  Eat a low-salt (low-sodium) diet to reduce fluid as told by your doctor.  Depending on the cause of your swelling, you may need to limit how much fluid you drink (fluid restriction).  Take over-the-counter and prescription medicines only as told by your doctor. Contact a doctor if:  Treatment is not working.  You have heart, liver, or kidney disease and have symptoms of edema.  You have sudden and unexplained weight gain. Get help right away if:  You have shortness of breath or chest pain.  You cannot breathe when you lie down.  You have pain, redness, or warmth in the swollen areas.  You have heart, liver, or kidney disease and get edema all of a sudden.  You have a fever and your symptoms get worse all of a sudden. Summary  Edema is when you have too much fluid in your body or under your skin.  Edema may make your legs, feet, and ankles swell up. Swelling is also common in looser tissues, like around your eyes.  Raise (elevate) the swollen body part above the level of your  heart when you are sitting or lying down.  Follow your doctor's instructions about diet and how much fluid you can drink (fluid restriction). This information is not intended to replace advice given to you by your health care provider. Make sure you discuss any questions you have with your health care provider. Document Revised: 01/27/2017 Document Reviewed: 02/12/2016 Elsevier Patient Education  2020 Elsevier Inc.  

## 2019-10-08 LAB — CBC WITH DIFFERENTIAL/PLATELET
Basophils Absolute: 0.1 10*3/uL (ref 0.0–0.2)
Basos: 1 %
EOS (ABSOLUTE): 0.1 10*3/uL (ref 0.0–0.4)
Eos: 1 %
Hematocrit: 42.1 % (ref 34.0–46.6)
Hemoglobin: 13.9 g/dL (ref 11.1–15.9)
Immature Grans (Abs): 0.1 10*3/uL (ref 0.0–0.1)
Immature Granulocytes: 1 %
Lymphocytes Absolute: 2.3 10*3/uL (ref 0.7–3.1)
Lymphs: 24 %
MCH: 31.2 pg (ref 26.6–33.0)
MCHC: 33 g/dL (ref 31.5–35.7)
MCV: 94 fL (ref 79–97)
Monocytes Absolute: 0.7 10*3/uL (ref 0.1–0.9)
Monocytes: 7 %
Neutrophils Absolute: 6.4 10*3/uL (ref 1.4–7.0)
Neutrophils: 66 %
Platelets: 214 10*3/uL (ref 150–450)
RBC: 4.46 x10E6/uL (ref 3.77–5.28)
RDW: 13.3 % (ref 11.7–15.4)
WBC: 9.7 10*3/uL (ref 3.4–10.8)

## 2019-10-08 LAB — CMP14+EGFR
ALT: 21 IU/L (ref 0–32)
AST: 31 IU/L (ref 0–40)
Albumin/Globulin Ratio: 1.4 (ref 1.2–2.2)
Albumin: 4.4 g/dL (ref 3.8–4.8)
Alkaline Phosphatase: 65 IU/L (ref 48–121)
BUN/Creatinine Ratio: 37 — ABNORMAL HIGH (ref 12–28)
BUN: 21 mg/dL (ref 8–27)
Bilirubin Total: 0.7 mg/dL (ref 0.0–1.2)
CO2: 27 mmol/L (ref 20–29)
Calcium: 8.9 mg/dL (ref 8.7–10.3)
Chloride: 100 mmol/L (ref 96–106)
Creatinine, Ser: 0.57 mg/dL (ref 0.57–1.00)
GFR calc Af Amer: 111 mL/min/{1.73_m2} (ref >59)
GFR calc non Af Amer: 96 mL/min/{1.73_m2} (ref >59)
Globulin, Total: 3.2 g/dL (ref 1.5–4.5)
Glucose: 145 mg/dL — ABNORMAL HIGH (ref 65–99)
Potassium: 3.8 mmol/L (ref 3.5–5.2)
Sodium: 141 mmol/L (ref 134–144)
Total Protein: 7.6 g/dL (ref 6.0–8.5)

## 2019-10-08 LAB — LIPID PANEL
Chol/HDL Ratio: 2.8 ratio (ref 0.0–4.4)
Cholesterol, Total: 130 mg/dL (ref 100–199)
HDL: 47 mg/dL (ref >39)
LDL Chol Calc (NIH): 63 mg/dL (ref 0–99)
Triglycerides: 110 mg/dL (ref 0–149)
VLDL Cholesterol Cal: 20 mg/dL (ref 5–40)

## 2019-10-18 ENCOUNTER — Ambulatory Visit (INDEPENDENT_AMBULATORY_CARE_PROVIDER_SITE_OTHER): Payer: Medicare HMO | Admitting: Nurse Practitioner

## 2019-10-18 ENCOUNTER — Encounter: Payer: Self-pay | Admitting: Nurse Practitioner

## 2019-10-18 ENCOUNTER — Telehealth: Payer: Self-pay | Admitting: Unknown Physician Specialty

## 2019-10-18 DIAGNOSIS — Z20822 Contact with and (suspected) exposure to covid-19: Secondary | ICD-10-CM

## 2019-10-18 MED ORDER — BENZONATATE 100 MG PO CAPS: 100.0000 mg | ORAL_CAPSULE | Freq: Three times a day (TID) | ORAL | 0 refills | Status: DC | PRN

## 2019-10-18 NOTE — Progress Notes (Signed)
   Virtual Visit via telephone Note Due to COVID-19 pandemic this visit was conducted virtually. This visit type was conducted due to national recommendations for restrictions regarding the COVID-19 Pandemic (e.g. social distancing, sheltering in place) in an effort to limit this patient's exposure and mitigate transmission in our community. All issues noted in this document were discussed and addressed.  A physical exam was not performed with this format.  I connected with Ashley [name] on 10/18/19 at 11:45 by telephone and verified that I am speaking with the correct person using two identifiers. Ashley [name] is currently located at home and her son in law is currently with her during visit. The provider, Ashley Hassell Done, FNP is located in their office at time of visit.  I discussed the limitations, risks, security and privacy concerns of performing an evaluation and management service by telephone and the availability of in person appointments. I also discussed with the patient that there may be a patient responsible charge related to this service. The patient expressed understanding and agreed to proceed.   History and Present Illness:   Chief Complaint: Covid Exposure   HPI Patient called in c/o sore throat and fever that started yesterday. She developed body aches and congestion during the night. Had a home test [name] and was positive.    Review of Systems  Constitutional: Positive for chills and fever.  HENT: Positive for congestion and sore throat.   Respiratory: Positive for cough.   Cardiovascular: Negative.   Neurological: Positive for headaches.  Psychiatric/Behavioral: Negative.   All other systems reviewed and are negative.    Observations/Objective: Alert and oriented- answers all questions appropriately No distress Deep cough Voice hoarse   Assessment and Plan: .Ashley [name] in today with chief complaint of Covid Exposure   1. Suspected COVID-19 virus  infection quarantine until results are back Force fluids Rest Referred t infusion clinic - benzonatate (TESSALON PERLES) 100 MG capsule; Take 1 capsule (100 mg total) by mouth 3 (three) times daily as needed for cough.  Dispense: 20 capsule; Refill: 0 - Novel Coronavirus, NAA ([facility])    Follow Up Instructions: prn    I discussed the assessment and treatment plan with the patient. The patient was provided an opportunity to ask questions and all were answered. The patient agreed with the plan and demonstrated an understanding of the instructions.   The patient was advised to call back or seek an in-person evaluation if the symptoms worsen or if the condition fails to improve as anticipated.  The above assessment and management plan was discussed with the patient. The patient verbalized understanding of and has agreed to the management plan. Patient is aware to call the clinic if symptoms persist or worsen. Patient is aware when to return to the clinic for a follow-up visit. Patient educated on when it is appropriate to go to the emergency department.   Time call ended:  12:05  I provided her 20  minutes of non-face-to-face time during this encounter.    Ashley Hassell Done, FNP

## 2019-10-18 NOTE — Telephone Encounter (Signed)
I connected by phone with Ashley [name] on 10/18/2019 at 5:47 PM to discuss the potential use of a new treatment for mild to moderate COVID-19 viral infection in non-hospitalized patients.  This patient is a 69 y.o. female that meets the FDA criteria for Emergency Use Authorization of COVID monoclonal antibody casirivimab/imdevimab.  Has a (+) direct SARS-CoV-2 viral test result  Has mild or moderate COVID-19   Is NOT hospitalized due to COVID-19  Is within 10 days of symptom onset  Has at least one of the high risk factor(s) for progression to severe COVID-19 and/or hospitalization as defined in EUA.  Specific high risk criteria : Older age (>/= 67 yo)   I have spoken and communicated the following to the patient or parent/caregiver regarding COVID monoclonal antibody treatment:  1. FDA has authorized the emergency use for the treatment of mild to moderate COVID-19 in adults and pediatric patients with positive results of direct SARS-CoV-2 viral testing who are 44 years of age and older weighing at least 40 kg, and who are at high risk for progressing to severe COVID-19 and/or hospitalization.  2. The significant known and potential risks and benefits of COVID monoclonal antibody, and the extent to which such potential risks and benefits are unknown.  3. Information on available alternative treatments and the risks and benefits of those alternatives, including clinical trials.  4. Patients treated with COVID monoclonal antibody should continue to self-isolate and use infection control measures (e.g., wear mask, isolate, social distance, avoid sharing personal items, clean and disinfect high touch surfaces, and frequent handwashing) according to CDC guidelines.   5. The patient or parent/caregiver has the option to accept or refuse COVID monoclonal antibody treatment.  After reviewing this information with the patient, The patient has DECLINED offer to receive the infusion. Kathrine Haddock 10/18/2019 5:47 PM  Given the hotline number if changes her mind

## 2019-10-20 ENCOUNTER — Other Ambulatory Visit: Payer: Self-pay | Admitting: Physician Assistant

## 2019-10-20 DIAGNOSIS — U071 COVID-19: Secondary | ICD-10-CM

## 2019-10-20 DIAGNOSIS — E119 Type 2 diabetes mellitus without complications: Secondary | ICD-10-CM

## 2019-10-20 DIAGNOSIS — I10 Essential (primary) hypertension: Secondary | ICD-10-CM

## 2019-10-20 NOTE — Progress Notes (Signed)
I connected by phone with Ashley [name] on 10/20/2019 at 1:11 PM to discuss the potential use of a new treatment for mild to moderate COVID-19 viral infection in non-hospitalized patients.  This patient is a 69 y.o. female that meets the FDA criteria for Emergency Use Authorization of COVID monoclonal antibody casirivimab/imdevimab.  Has a (+) direct SARS-CoV-2 viral test result  Has mild or moderate COVID-19   Is NOT hospitalized due to COVID-19  Is within 10 days of symptom onset  Has at least one of the high risk factor(s) for progression to severe COVID-19 and/or hospitalization as defined in EUA.  Specific high risk criteria : Older age (>/= 67 yo), BMI > 25, Diabetes and Cardiovascular disease or hypertension   I have spoken and communicated the following to the patient or parent/caregiver regarding COVID monoclonal antibody treatment:  1. FDA has authorized the emergency use for the treatment of mild to moderate COVID-19 in adults and pediatric patients with positive results of direct SARS-CoV-2 viral testing who are 30 years of age and older weighing at least 40 kg, and who are at high risk for progressing to severe COVID-19 and/or hospitalization.  2. The significant known and potential risks and benefits of COVID monoclonal antibody, and the extent to which such potential risks and benefits are unknown.  3. Information on available alternative treatments and the risks and benefits of those alternatives, including clinical trials.  4. Patients treated with COVID monoclonal antibody should continue to self-isolate and use infection control measures (e.g., wear mask, isolate, social distance, avoid sharing personal items, clean and disinfect "high touch" surfaces, and frequent handwashing) according to CDC guidelines.   5. The patient or parent/caregiver has the option to accept or refuse COVID monoclonal antibody treatment.  Patient initially declined, but now wants to proceed with  infusion.  After reviewing this information with the patient, The patient agreed to proceed with receiving casirivimab\imdevimab infusion and will be provided a copy of the Fact sheet prior to receiving the infusion. [name] 10/20/2019 1:11 PM

## 2019-10-21 ENCOUNTER — Ambulatory Visit (HOSPITAL_COMMUNITY)
Admission: RE | Admit: 2019-10-21 | Discharge: 2019-10-21 | Disposition: A | Discharge status: Home / Self Care | Payer: Medicare Other | Source: Ambulatory Visit | Attending: Pulmonary Disease | Admitting: Pulmonary Disease

## 2019-10-21 ENCOUNTER — Other Ambulatory Visit (HOSPITAL_COMMUNITY): Payer: Self-pay

## 2019-10-21 DIAGNOSIS — U071 COVID-19: Secondary | ICD-10-CM | POA: Insufficient documentation

## 2019-10-21 DIAGNOSIS — Z23 Encounter for immunization: Secondary | ICD-10-CM | POA: Diagnosis not present

## 2019-10-21 LAB — NOVEL CORONAVIRUS, NAA: SARS-CoV-2, NAA: DETECTED — AB

## 2019-10-21 MED ORDER — EPINEPHRINE 0.3 MG/0.3ML IJ SOAJ: 0.3000 mg | Freq: Once | INTRAMUSCULAR | Status: DC | PRN

## 2019-10-21 MED ORDER — METHYLPREDNISOLONE SODIUM SUCC 125 MG IJ SOLR: 125.0000 mg | Freq: Once | INTRAMUSCULAR | Status: DC | PRN

## 2019-10-21 MED ORDER — ALBUTEROL SULFATE HFA 108 (90 BASE) MCG/ACT IN AERS: 2.0000 | INHALATION_SPRAY | Freq: Once | RESPIRATORY_TRACT | Status: DC | PRN

## 2019-10-21 MED ORDER — FAMOTIDINE IN NACL 20-0.9 MG/50ML-% IV SOLN: 20.0000 mg | Freq: Once | INTRAVENOUS | Status: DC | PRN

## 2019-10-21 MED ORDER — SODIUM CHLORIDE 0.9 % IV SOLN: INTRAVENOUS | Status: DC | PRN

## 2019-10-21 MED ORDER — DIPHENHYDRAMINE HCL 50 MG/ML IJ SOLN: 50.0000 mg | Freq: Once | INTRAMUSCULAR | Status: DC | PRN

## 2019-10-21 MED ORDER — SODIUM CHLORIDE 0.9 % IV SOLN
1200.0000 mg | Freq: Once | INTRAVENOUS | Status: AC
  Administered 2019-10-21: 1200 mg via INTRAVENOUS
  Filled 2019-10-21: qty 10

## 2019-10-21 NOTE — Progress Notes (Signed)
  Diagnosis: COVID-19  Physician:Dr Joya Gaskins   Procedure: Cov[facility] Infusion Clinic Med: casirivimab\imdevimab infusion - Provided patient with casirivimab\imdevimab fact sheet for patients, parents and caregivers prior to infusion.  Complications: No immediate complications noted.  Discharge: Discharged home   Ashley [name] [facility] 10/21/2019

## 2019-10-21 NOTE — Discharge Instructions (Signed)
10 Things You Can Do to Manage Your COVID-19 Symptoms at Home If you have possible or confirmed COVID-19: 1. Stay home from work and school. And stay away from other public places. If you must go out, avoid using any kind of public transportation, ridesharing, or taxis. 2. Monitor your symptoms carefully. If your symptoms get worse, call your healthcare provider immediately. 3. Get rest and stay hydrated. 4. If you have a medical appointment, call the healthcare provider ahead of time and tell them that you have or may have COVID-19. 5. For medical emergencies, call 911 and notify the dispatch personnel that you have or may have COVID-19. 6. Cover your cough and sneezes with a tissue or use the inside of your elbow. 7. Wash your hands often with soap and water for at least 20 seconds or clean your hands with an alcohol-based hand sanitizer that contains at least 60% alcohol. 8. As much as possible, stay in a specific room and away from other people in your home. Also, you should use a separate bathroom, if available. If you need to be around other people in or outside of the home, wear a mask. 9. Avoid sharing personal items with other people in your household, like dishes, towels, and bedding. 10. Clean all surfaces that are touched often, like counters, tabletops, and doorknobs. Use household cleaning sprays or wipes according to the label instructions. [email or web address] 08/08/2018 This information is not intended to replace advice given to you by your health care provider. Make sure you discuss any questions you have with your health care provider. Document Revised: 01/10/2019 Document Reviewed: 01/10/2019 Elsevier Patient Education  [place].  COVID-19: How to Protect Yourself and Others Know how it spreads  There is currently no vaccine to prevent coronavirus disease 2019 (COVID-19).  The best way to prevent illness is to avoid being exposed to this virus.  The virus is  thought to spread mainly from person-to-person. ? Between people who are in close contact with one another (within about 6 feet). ? Through respiratory droplets produced when an infected person coughs, sneezes or talks. ? These droplets can land in the mouths or noses of people who are nearby or possibly be inhaled into the lungs. ? COVID-19 may be spread by people who are not showing symptoms. Everyone should Clean your hands often  Wash your hands often with soap and water for at least 20 seconds especially after you have been in a public place, or after blowing your nose, coughing, or sneezing.  If soap and water are not readily available, use a hand sanitizer that contains at least 60% alcohol. Cover all surfaces of your hands and rub them together until they feel dry.  Avoid touching your eyes, nose, and mouth with unwashed hands. Avoid close contact  Limit contact with others as much as possible.  Avoid close contact with people who are sick.  Put distance between yourself and other people. ? Remember that some people without symptoms may be able to spread virus.[place] ?  ? This is especially important for people who are at higher risk of getting very [email or web address] Cover your mouth and nose with a mask when around others  You could spread COVID-19 to others even if you do not feel sick.  Everyone should wear a mask in public settings and when around people not living in their household, especially when social distancing is difficult to maintain. ? Masks should not be placed on young children under age 58,  anyone who has trouble breathing, or is unconscious, incapacitated or otherwise unable to remove the mask without assistance.  The mask is meant to protect other people in case you are infected.  Do NOT use a facemask meant for a Dietitian.  Continue to keep about 6 feet between yourself and others.  The mask is not a substitute for social distancing. Cover coughs and sneezes  Always cover your mouth and nose with a tissue when you cough or sneeze or use the inside of your elbow.  Throw used tissues in the trash.  Immediately wash your hands with soap and water for at least 20 seconds. If soap and water are not readily available, clean your hands with a hand sanitizer that contains at least 60% alcohol. Clean and disinfect  Clean AND disinfect frequently touched surfaces daily. This includes tables, doorknobs, light switches, countertops, handles, desks, phones, keyboards, toilets, faucets, and sinks. [email or web address]  If surfaces are dirty, clean them: Use detergent or soap and water prior to disinfection.  Then, use a household disinfectant. You can see a list of EPA-registered household disinfectants here. [email or web address] What types of side effects do monoclonal antibody drugs cause?  Common side effects  In general, the more common side effects caused by monoclonal antibody drugs include: . Allergic reactions, such as hives or itching . Flu-like signs and symptoms, including chills, fatigue, fever, and muscle aches and pains . Nausea, vomiting . Diarrhea . Skin rashes . Low blood pressure   The CDC is recommending patients who receive monoclonal antibody treatments wait at least 90 days before being vaccinated.  Currently, there are no data on the safety and efficacy of mRNA COVID-19 vaccines in persons who received monoclonal antibodies or convalescent plasma as part of COVID-19 treatment. Based on the estimated half-life of such therapies as well as evidence suggesting that reinfection is uncommon in the 90 days after initial infection, vaccination should be deferred for at least 90 days, as a precautionary measure until additional information becomes available, to avoid interference of the antibody  treatment with vaccine-induced immune responses.  10/10/2018 This information is not intended to replace advice given to you by your health care provider. Make sure you discuss any questions you have with your health care provider. Document Revised: 10/18/2018 Document Reviewed: 08/16/2018 Elsevier Patient Education  [place].  COVID-19: How to Protect Yourself and Others Know how it spreads  There is currently no vaccine to prevent coronavirus disease 2019 (COVID-19).  The best way to prevent illness is to avoid being exposed to this virus.  The virus is thought to spread mainly from person-to-person. ? Between people who are in close contact with one another (within about 6 feet). ? Through respiratory droplets produced when an infected person coughs, sneezes or talks. ? These droplets can land in the mouths or noses of people who are nearby or possibly be inhaled into the lungs. ? COVID-19 may be spread by people who are not showing symptoms. Everyone should Clean your hands often  Wash your hands often with soap and water for at least 20 seconds especially after you have been in a public place, or after blowing your nose, coughing, or sneezing.  If soap and water are not readily available, use a hand sanitizer that contains at least 60% alcohol. Cover all surfaces of your hands and rub them together until they feel dry.  Avoid touching your eyes, nose, and mouth with unwashed hands. Avoid close contact  Limit contact with  others as much as possible.  Avoid close contact with people who are sick.  Put distance between yourself and other people. ? Remember that some people without symptoms may be able to spread virus. ? This is especially important for people who are at higher risk of getting very [email or web address] Cover your mouth and nose with a mask when around others  You could spread COVID-19  to others even if you do not feel sick.  Everyone should wear a mask in public settings and when around people not living in their household, especially when social distancing is difficult to maintain. ? Masks should not be placed on young children under age 49, anyone who has trouble breathing, or is unconscious, incapacitated or otherwise unable to remove the mask without assistance.  The mask is meant to protect other people in case you are infected.  Do NOT use a facemask meant for a Dietitian.  Continue to keep about 6 feet between yourself and others. The mask is not a substitute for social distancing. Cover coughs and sneezes  Always cover your mouth and nose with a tissue when you cough or sneeze or use the inside of your elbow.  Throw used tissues in the trash.  Immediately wash your hands with soap and water for at least 20 seconds. If soap and water are not readily available, clean your hands with a hand sanitizer that contains at least 60% alcohol. Clean and disinfect  Clean AND disinfect frequently touched surfaces daily. This includes tables, doorknobs, light switches, countertops, handles, desks, phones, keyboards, toilets, faucets, and sinks. [email or web address]  If surfaces are dirty, clean them: Use detergent or soap and water prior to disinfection.  Then, use a household disinfectant. You can see a list of EPA-registered household disinfectants here. [email or web address] 10/10/2018 .[place]   This information is not intended to replace advice given to you by your health care provider. Make sure you discuss any questions you have with your health care provider. Document Revised: 10/18/2018 Document Reviewed: 08/16/2018 Elsevier Patient Education  [place] Can Do to Manage Your COVID-19 Symptoms at Home   If you have possible or confirmed COVID-19: 11. Stay home from work  and school. And stay away from other public places. If you must go out, avoid using any kind of public transportation, ridesharing, or taxis. 12. Monitor your symptoms carefully. If your symptoms get worse, call your healthcare provider immediately. 13. Get rest and stay hydrated. 14. If you have a medical appointment, call the healthcare provider ahead of time and tell them that you have or may have COVID-19. 15. For medical emergencies, call 911 and notify the dispatch personnel that you have or may have COVID-19. 16. Cover your cough and sneezes with a tissue or use the inside of your elbow. 61. Wash your hands often with soap and water for at least 20 seconds or clean your hands with an alcohol-based hand sanitizer that contains at least 60% alcohol. 18. As much as possible, stay in a specific room and away from other people in your home. Also, you should use a separate bathroom, if available. If you need to be around other people in or outside of the home, wear a mask. 19. Avoid sharing personal items with other people in your household, like dishes, towels, and bedding. 20. Clean all surfaces that are touched often, like counters, tabletops, and doorknobs. Use household cleaning sprays or wipes according to the label instructions. [email or web address] 08/08/2018 This information is  not intended to replace advice given to you by your health care provider. Make sure you discuss any questions you have with your health care provider. Document Revised: 01/10/2019 Document Reviewed: 01/10/2019 Elsevier Patient Education  [place].

## 2019-10-24 ENCOUNTER — Other Ambulatory Visit: Payer: Self-pay

## 2019-10-24 MED ORDER — ALBUTEROL SULFATE HFA 108 (90 BASE) MCG/ACT IN AERS: 2.0000 | INHALATION_SPRAY | Freq: Four times a day (QID) | RESPIRATORY_TRACT | 1 refills | Status: DC | PRN

## 2019-10-25 ENCOUNTER — Other Ambulatory Visit (HOSPITAL_COMMUNITY): Payer: Self-pay

## 2019-12-04 ENCOUNTER — Other Ambulatory Visit: Payer: Self-pay | Admitting: Nurse Practitioner

## 2019-12-04 DIAGNOSIS — I10 Essential (primary) hypertension: Secondary | ICD-10-CM

## 2019-12-18 ENCOUNTER — Other Ambulatory Visit: Payer: Self-pay | Admitting: Nurse Practitioner

## 2019-12-18 DIAGNOSIS — R609 Edema, unspecified: Secondary | ICD-10-CM

## 2019-12-26 ENCOUNTER — Encounter: Payer: Self-pay | Admitting: Nurse Practitioner

## 2019-12-26 ENCOUNTER — Ambulatory Visit (INDEPENDENT_AMBULATORY_CARE_PROVIDER_SITE_OTHER): Payer: Medicare HMO | Admitting: Nurse Practitioner

## 2019-12-26 DIAGNOSIS — L03115 Cellulitis of right lower limb: Secondary | ICD-10-CM | POA: Diagnosis not present

## 2019-12-26 MED ORDER — SULFAMETHOXAZOLE-TRIMETHOPRIM 800-160 MG PO TABS: 1.0000 | ORAL_TABLET | Freq: Two times a day (BID) | ORAL | 0 refills | Status: DC

## 2019-12-26 NOTE — Progress Notes (Signed)
   Virtual Visit via telephone Note Due to COVID-19 pandemic this visit was conducted virtually. This visit type was conducted due to national recommendations for restrictions regarding the COVID-19 Pandemic (e.g. social distancing, sheltering in place) in an effort to limit this patient's exposure and mitigate transmission in our community. All issues noted in this document were discussed and addressed.  A physical exam was not performed with this format.  I connected with Ashley [name] on 12/26/19 at 9:20 by telephone and verified that I am speaking with the correct person using two identifiers. Ashley [name] is currently located at home and no one is currently with  her during visit. The provider, Mary-Margaret Hassell Done, FNP is located in their office at time of visit.  I discussed the limitations, risks, security and privacy concerns of performing an evaluation and management service by telephone and the availability of in person appointments. I also discussed with the patient that there may be a patient responsible charge related to this service. The patient expressed understanding and agreed to proceed.   History and Present Illness:   Chief Complaint: Rash   HPI Has a rash on bil lower ext that burns and itches. Cool to touch. Has used lotrisone and it is not helping. She has this frequently and last time had to have antibiotic to clear it up   Review of Systems  Constitutional: Negative for diaphoresis and weight loss.  Eyes: Negative for blurred vision, double vision and pain.  Respiratory: Negative for shortness of breath.   Cardiovascular: Negative for chest pain, palpitations, orthopnea and leg swelling.  Gastrointestinal: Negative for abdominal pain.  Skin: Negative for rash.  Neurological: Negative for dizziness, sensory change, loss of consciousness, weakness and headaches.  Endo/Heme/Allergies: Negative for polydipsia. Does not bruise/bleed easily.  Psychiatric/Behavioral:  Negative for memory loss. The patient does not have insomnia.   All other systems reviewed and are negative.    Observations/Objective: Alert and oriented- answers all questions appropriately No distress   Describes rash as slightly raised erythematous patcy areas on front of both legs- right worse then left  Assessment and Plan: [name] in today with chief complaint of Rash   1. Cellulitis of right lower extremity Cool compresses Avoid scratching Continue lotrisone cream as prescribed  In past Will recheck at follow up appointment in 2 weeks     Follow Up Instructions: Has appointment in 2 weeks    I discussed the assessment and treatment plan with the patient. The patient was provided an opportunity to ask questions and all were answered. The patient agreed with the plan and demonstrated an understanding of the instructions.   The patient was advised to call back or seek an in-person evaluation if the symptoms worsen or if the condition fails to improve as anticipated.  The above assessment and management plan was discussed with the patient. The patient verbalized understanding of and has agreed to the management plan. Patient is aware to call the clinic if symptoms persist or worsen. Patient is aware when to return to the clinic for a follow-up visit. Patient educated on when it is appropriate to go to the emergency department.   Time call ended:  9:36  I provided 16 minutes of non-face-to-face time during this encounter.    Mary-Margaret Hassell Done, FNP

## 2020-01-08 ENCOUNTER — Other Ambulatory Visit: Payer: Self-pay

## 2020-01-08 ENCOUNTER — Encounter: Payer: Self-pay | Admitting: Nurse Practitioner

## 2020-01-08 ENCOUNTER — Ambulatory Visit (INDEPENDENT_AMBULATORY_CARE_PROVIDER_SITE_OTHER): Payer: Self-pay | Admitting: Nurse Practitioner

## 2020-01-08 DIAGNOSIS — Z024 Encounter for examination for driving license: Secondary | ICD-10-CM | POA: Diagnosis not present

## 2020-01-08 LAB — URINALYSIS
Bilirubin, UA: NEGATIVE
Glucose, UA: NEGATIVE
Ketones, UA: NEGATIVE
Leukocytes,UA: NEGATIVE
Nitrite, UA: NEGATIVE
Protein,UA: NEGATIVE
RBC, UA: NEGATIVE
Specific Gravity, UA: 1.01 (ref 1.005–1.030)
Urobilinogen, Ur: 0.2 mg/dL (ref 0.2–1.0)
pH, UA: 5 (ref 5.0–7.5)

## 2020-01-08 NOTE — Progress Notes (Signed)
Patient ID: Ashley [name], female   DOB: 17-May-1950, 69 y.o.   MRN: [id number]   See scanned in documantation

## 2020-01-16 DIAGNOSIS — Z1231 Encounter for screening mammogram for malignant neoplasm of breast: Secondary | ICD-10-CM | POA: Diagnosis not present

## 2020-01-27 DIAGNOSIS — H5213 Myopia, bilateral: Secondary | ICD-10-CM | POA: Diagnosis not present

## 2020-01-27 DIAGNOSIS — H40033 Anatomical narrow angle, bilateral: Secondary | ICD-10-CM | POA: Diagnosis not present

## 2020-01-29 ENCOUNTER — Other Ambulatory Visit: Payer: Self-pay | Admitting: Nurse Practitioner

## 2020-01-29 DIAGNOSIS — I10 Essential (primary) hypertension: Secondary | ICD-10-CM

## 2020-02-14 ENCOUNTER — Other Ambulatory Visit: Payer: Self-pay | Admitting: Nurse Practitioner

## 2020-02-14 DIAGNOSIS — R609 Edema, unspecified: Secondary | ICD-10-CM

## 2020-04-10 ENCOUNTER — Ambulatory Visit: Payer: Medicare HMO | Admitting: Nurse Practitioner

## 2020-04-29 ENCOUNTER — Other Ambulatory Visit: Payer: Self-pay | Admitting: Nurse Practitioner

## 2020-04-29 DIAGNOSIS — R609 Edema, unspecified: Secondary | ICD-10-CM

## 2020-05-12 ENCOUNTER — Ambulatory Visit: Payer: Medicare HMO | Admitting: Nurse Practitioner

## 2020-05-26 ENCOUNTER — Encounter: Payer: Self-pay | Admitting: Nurse Practitioner

## 2020-05-26 ENCOUNTER — Other Ambulatory Visit: Payer: Self-pay

## 2020-05-26 ENCOUNTER — Ambulatory Visit (INDEPENDENT_AMBULATORY_CARE_PROVIDER_SITE_OTHER): Payer: Medicare HMO | Admitting: Nurse Practitioner

## 2020-05-26 VITALS — BP 132/77 | HR 64 | Temp 98.1°F | Resp 20 | Ht 62.0 in | Wt 265.0 lb

## 2020-05-26 DIAGNOSIS — E7800 Pure hypercholesterolemia, unspecified: Secondary | ICD-10-CM

## 2020-05-26 DIAGNOSIS — F411 Generalized anxiety disorder: Secondary | ICD-10-CM | POA: Diagnosis not present

## 2020-05-26 DIAGNOSIS — I10 Essential (primary) hypertension: Secondary | ICD-10-CM

## 2020-05-26 DIAGNOSIS — E119 Type 2 diabetes mellitus without complications: Secondary | ICD-10-CM

## 2020-05-26 DIAGNOSIS — E1159 Type 2 diabetes mellitus with other circulatory complications: Secondary | ICD-10-CM | POA: Diagnosis not present

## 2020-05-26 DIAGNOSIS — I152 Hypertension secondary to endocrine disorders: Secondary | ICD-10-CM | POA: Diagnosis not present

## 2020-05-26 DIAGNOSIS — Z6841 Body Mass Index (BMI) 40.0 and over, adult: Secondary | ICD-10-CM | POA: Diagnosis not present

## 2020-05-26 DIAGNOSIS — I4819 Other persistent atrial fibrillation: Secondary | ICD-10-CM

## 2020-05-26 DIAGNOSIS — R609 Edema, unspecified: Secondary | ICD-10-CM | POA: Diagnosis not present

## 2020-05-26 DIAGNOSIS — E782 Mixed hyperlipidemia: Secondary | ICD-10-CM | POA: Diagnosis not present

## 2020-05-26 LAB — BAYER DCA HB A1C WAIVED: HB A1C (BAYER DCA - WAIVED): 7.1 % — ABNORMAL HIGH (ref <7.0)

## 2020-05-26 MED ORDER — AMLODIPINE BESYLATE 5 MG PO TABS: 1.0000 | ORAL_TABLET | Freq: Every day | ORAL | 1 refills | Status: DC

## 2020-05-26 MED ORDER — ALPRAZOLAM 0.25 MG PO TABS: 0.2500 mg | ORAL_TABLET | Freq: Every day | ORAL | 2 refills | Status: DC | PRN

## 2020-05-26 MED ORDER — METOPROLOL TARTRATE 50 MG PO TABS: 50.0000 mg | ORAL_TABLET | Freq: Two times a day (BID) | ORAL | 1 refills | Status: DC

## 2020-05-26 MED ORDER — QUINAPRIL HCL 40 MG PO TABS: 40.0000 mg | ORAL_TABLET | Freq: Every day | ORAL | 1 refills | Status: DC

## 2020-05-26 MED ORDER — GLIPIZIDE 5 MG PO TABS: 5.0000 mg | ORAL_TABLET | Freq: Two times a day (BID) | ORAL | 1 refills | Status: DC

## 2020-05-26 MED ORDER — SIMVASTATIN 40 MG PO TABS: ORAL_TABLET | ORAL | 1 refills | Status: DC

## 2020-05-26 MED ORDER — FUROSEMIDE 20 MG PO TABS: 20.0000 mg | ORAL_TABLET | Freq: Every day | ORAL | 0 refills | Status: DC

## 2020-05-26 MED ORDER — METFORMIN HCL 1000 MG PO TABS: 1000.0000 mg | ORAL_TABLET | Freq: Two times a day (BID) | ORAL | 1 refills | Status: DC

## 2020-05-26 MED ORDER — CLONIDINE HCL 0.2 MG PO TABS: 0.2000 mg | ORAL_TABLET | Freq: Two times a day (BID) | ORAL | 1 refills | Status: DC

## 2020-05-26 MED ORDER — ELIQUIS 5 MG PO TABS: 5.0000 mg | ORAL_TABLET | Freq: Two times a day (BID) | ORAL | 1 refills | Status: DC

## 2020-05-26 NOTE — Patient Instructions (Signed)
Diabetes Mellitus and Foot Care Foot care is an important part of your health, especially when you have diabetes. Diabetes may cause you to have problems because of poor blood flow (circulation) to your feet and legs, which can cause your skin to:  Become thinner and drier.  Break more easily.  Heal more slowly.  Peel and crack. You may also have nerve damage (neuropathy) in your legs and feet, causing decreased feeling in them. This means that you may not notice minor injuries to your feet that could lead to more serious problems. Noticing and addressing any potential problems early is the best way to prevent future foot problems. How to care for your feet Foot hygiene  Wash your feet daily with warm water and mild soap. Do not use hot water. Then, pat your feet and the areas between your toes until they are completely dry. Do not soak your feet as this can dry your skin.  Trim your toenails straight across. Do not dig under them or around the cuticle. File the edges of your nails with an emery board or nail file.  Apply a moisturizing lotion or petroleum jelly to the skin on your feet and to dry, brittle toenails. Use lotion that does not contain alcohol and is unscented. Do not apply lotion between your toes.   Shoes and socks  Wear clean socks or stockings every day. Make sure they are not too tight. Do not wear knee-high stockings since they may decrease blood flow to your legs.  Wear shoes that fit properly and have enough cushioning. Always look in your shoes before you put them on to be sure there are no objects inside.  To break in new shoes, wear them for just a few hours a day. This prevents injuries on your feet. Wounds, scrapes, corns, and calluses  Check your feet daily for blisters, cuts, bruises, sores, and redness. If you cannot see the bottom of your feet, use a mirror or ask someone for help.  Do not cut corns or calluses or try to remove them with medicine.  If you  find a minor scrape, cut, or break in the skin on your feet, keep it and the skin around it clean and dry. You may clean these areas with mild soap and water. Do not clean the area with peroxide, alcohol, or iodine.  If you have a wound, scrape, corn, or callus on your foot, look at it several times a day to make sure it is healing and not infected. Check for: ? Redness, swelling, or pain. ? Fluid or blood. ? Warmth. ? Pus or a bad smell.   General tips  Do not cross your legs. This may decrease blood flow to your feet.  Do not use heating pads or hot water bottles on your feet. They may burn your skin. If you have lost feeling in your feet or legs, you may not know this is happening until it is too late.  Protect your feet from hot and cold by wearing shoes, such as at the beach or on hot pavement.  Schedule a complete foot exam at least once a year (annually) or more often if you have foot problems. Report any cuts, sores, or bruises to your health care provider immediately. Where to find more information  American Diabetes Association: www.diabetes.org  Association of Diabetes Care & Education Specialists: www.diabeteseducator.org Contact a health care provider if:  You have a medical condition that increases your risk of infection and   you have any cuts, sores, or bruises on your feet.  You have an injury that is not healing.  You have redness on your legs or feet.  You feel burning or tingling in your legs or feet.  You have pain or cramps in your legs and feet.  Your legs or feet are numb.  Your feet always feel cold.  You have pain around any toenails. Get help right away if:  You have a wound, scrape, corn, or callus on your foot and: ? You have pain, swelling, or redness that gets worse. ? You have fluid or blood coming from the wound, scrape, corn, or callus. ? Your wound, scrape, corn, or callus feels warm to the touch. ? You have pus or a bad smell coming from  the wound, scrape, corn, or callus. ? You have a fever. ? You have a red line going up your leg. Summary  Check your feet every day for blisters, cuts, bruises, sores, and redness.  Apply a moisturizing lotion or petroleum jelly to the skin on your feet and to dry, brittle toenails.  Wear shoes that fit properly and have enough cushioning.  If you have foot problems, report any cuts, sores, or bruises to your health care provider immediately.  Schedule a complete foot exam at least once a year (annually) or more often if you have foot problems. This information is not intended to replace advice given to you by your health care provider. Make sure you discuss any questions you have with your health care provider. Document Revised: 08/15/2019 Document Reviewed: 08/15/2019 Elsevier Patient Education  2021 Elsevier Inc.  

## 2020-05-26 NOTE — Progress Notes (Signed)
Subjective:    Patient ID: Ashley [name], female    DOB: [birth year], 70 y.o.   MRN: [id number]   Chief Complaint: Medical Management of Chronic Issues    HPI:  1. Hypertension associated with diabetes ([place]) Does not watch diet and does very little exerise. BP Readings from Last 3 Encounters:  05/26/20 132/77  10/21/19 132/67  10/07/19 (!) 141/77     2. Mixed hyperlipidemia Doe snot wtahc diet and does little to no exercise. Lab Results  Component Value Date   CHOL 130 10/07/2019   HDL 47 10/07/2019   LDLCALC 63 10/07/2019   TRIG 110 10/07/2019   CHOLHDL 2.8 10/07/2019     3. Type 2 diabetes mellitus without complication, without long-term current use of insulin ([place]) She very seldom checks her blood sugars. She tries to avoid sweets. Lab Results  Component Value Date   HGBA1C 7.1 (H) 10/07/2019     4. Persistent atrial fibrillation (H[facility]) Denies any palpitations or heart racing.  5. Peripheral edema Has daily swelling by the end of the day.  6. Severe obesity (BMI >= 40) (HCC) No recent weight changes Wt Readings from Last 3 Encounters:  05/26/20 265 lb (120.2 kg)  10/07/19 266 lb (120.7 kg)  06/14/19 271 lb (122.9 kg)   BMI Readings from Last 3 Encounters:  05/26/20 48.47 kg/m  10/07/19 48.65 kg/m  06/14/19 49.57 kg/m      Outpatient Encounter Medications as of 05/26/2020  Medication Sig  . Accu-Chek Softclix Lancets lancets TEST BLOOD SUGAR TWICE DAILY Dx E11.9  . acetaminophen (TYLENOL) 500 MG tablet Take 1,000 mg by mouth every 6 (six) hours as needed (for pain/headaches.).  Marland Kitchen albuterol (VENTOLIN HFA) 108 (90 Base) MCG/ACT inhaler INHALE 2 PUFFS INTO THE LUNGS EVERY 6 HOURS AS NEEDED FOR WHEEZE OR SHORTNESS OF BREATH  . Alcohol Swabs (B-D SINGLE USE SWABS REGULAR) PADS CHECK BLOOD SUGAR TWICE DAILY Dx E11.9  . ALPRAZolam (XANAX) 0.25 MG tablet Take 1 tablet (0.25 mg total) by mouth daily as needed for anxiety.  Marland Kitchen amLODipine (NORVASC) 5 MG tablet  TAKE 1 TABLET EVERY DAY  . apixaban (ELIQUIS) 5 MG TABS tablet Take 1 tablet (5 mg total) by mouth 2 (two) times daily.  . Blood Glucose Monitoring Suppl (ACCU-CHEK AVIVA PLUS) w/Device KIT USE AS DIRECTED  . Blood Glucose Monitoring Suppl (ACCU-CHEK GUIDE ME) w/Device KIT 1 kit by Does not apply route 2 (two) times daily.  Marland Kitchen CINNAMON PO Take 1 capsule by mouth daily.   . cloNIDine (CATAPRES) 0.2 MG tablet Take 1 tablet (0.2 mg total) by mouth 2 (two) times daily.  . clotrimazole-betamethasone (LOTRISONE) cream APPLY TO AFFECTED AREA TWICE A DAY  . furosemide (LASIX) 20 MG tablet TAKE 1 TABLET (20 MG TOTAL) BY MOUTH DAILY.  . Ginger, Zingiber officinalis, (GINGER ROOT PO) Take 1 capsule by mouth daily.   Marland Kitchen glucose blood (ACCU-CHEK AVIVA PLUS) test strip TEST BLOOD SUGAR TWICE DAILY Dx E11.9  . meloxicam (MOBIC) 15 MG tablet TAKE 1 TABLET BY MOUTH EVERY DAY  . metFORMIN (GLUCOPHAGE) 1000 MG tablet Take 1 tablet (1,000 mg total) by mouth 2 (two) times daily with a meal.  . metoprolol tartrate (LOPRESSOR) 50 MG tablet Take 1 tablet (50 mg total) by mouth 2 (two) times daily.  Marland Kitchen nystatin cream (MYCOSTATIN) Apply 1 application topically 2 (two) times daily.  . quinapril (ACCUPRIL) 40 MG tablet TAKE 1 TABLET EVERY DAY  . simvastatin (ZOCOR) 40 MG tablet TAKE 1 TABLET  EVERY DAY IN THE EVENING  . Turmeric Curcumin 500 MG CAPS Take 500 mg by mouth daily.   Marland Kitchen glipiZIDE (GLUCOTROL) 5 MG tablet Take 1 tablet (5 mg total) by mouth 2 (two) times daily.  . [DISCONTINUED] benzonatate (TESSALON PERLES) 100 MG capsule Take 1 capsule (100 mg total) by mouth 3 (three) times daily as needed for cough.  . [DISCONTINUED] sulfamethoxazole-trimethoprim (BACTRIM DS) 800-160 MG tablet Take 1 tablet by mouth 2 (two) times daily.   No facility-administered encounter medications on file as of 05/26/2020.    Past Surgical History:  Procedure Laterality Date  . CYST EXCISION Left 03/15/2017   from buttocks  . ORIF ANKLE  FRACTURE Left 04/20/2017   Procedure: OPEN REDUCTION INTERNAL FIXATION (ORIF) LEFT ANKLE FRACTURE;  Surgeon: Netta Cedars, MD;  Location: [facility];  Service: Orthopedics;  Laterality: Left;    Family History  Problem Relation Age of Onset  . Diabetes Mother   . CAD Mother        CABG at age 53  . Hypertension Mother   . Hyperlipidemia Mother   . Diabetes Father   . Heart disease Father        CHF  . Hypertension Father   . Diabetes Sister   . CAD Sister   . Hypertension Sister   . Diabetes Brother   . Hypertension Brother   . Healthy Sister     New complaints: None today  Social history: Lives by herself. Hr daughter checks on her daily  Controlled substance contract: 05/26/20    Review of Systems  Constitutional: Negative for diaphoresis.  Eyes: Negative for pain.  Respiratory: Negative for shortness of breath.   Cardiovascular: Negative for chest pain, palpitations and leg swelling.  Gastrointestinal: Negative for abdominal pain.  Endocrine: Negative for polydipsia.  Skin: Negative for rash.  Neurological: Negative for dizziness, weakness and headaches.  Hematological: Does not bruise/bleed easily.  All other systems reviewed and are negative.      Objective:   Physical Exam Vitals and nursing note reviewed.  Constitutional:      General: She is not in acute distress.    Appearance: Normal appearance. She is well-developed.  HENT:     Head: Normocephalic.     Nose: Nose normal.  Eyes:     Pupils: Pupils are equal, round, and reactive to light.  Neck:     Vascular: No carotid bruit or JVD.  Cardiovascular:     Rate and Rhythm: Normal rate and regular rhythm.     Heart sounds: Murmur (2/6 systolic) heard.    Pulmonary:     Effort: Pulmonary effort is normal. No respiratory distress.     Breath sounds: Normal breath sounds. No wheezing or rales.  Chest:     Chest wall: No tenderness.  Abdominal:     General: Bowel sounds are normal. There is no  distension or abdominal bruit.     Palpations: Abdomen is soft. There is no hepatomegaly, splenomegaly, mass or pulsatile mass.     Tenderness: There is no abdominal tenderness.  Musculoskeletal:        General: Normal range of motion.     Cervical back: Normal range of motion and neck supple.  Lymphadenopathy:     Cervical: No cervical adenopathy.  Skin:    General: Skin is warm and dry.  Neurological:     Mental Status: She is alert and oriented to person, place, and time.     Deep Tendon Reflexes: Reflexes are  normal and symmetric.  Psychiatric:        Behavior: Behavior normal.        Thought Content: Thought content normal.        Judgment: Judgment normal.    BP 132/77   Pulse 64   Temp 98.1 F (36.7 C) (Temporal)   Resp 20   Ht _0  (1.575 m)   Wt 265 lb (120.2 kg)   SpO2 98%   BMI 48.47 kg/m   HGBA1c 7.1%      Assessment & Plan:  Folashade Gamboa comes in today with chief complaint of Medical Management of Chronic Issues   Diagnosis and orders addressed:  1. Hypertension associated with diabetes ([place]) Low sodium diet - CBC with Differential/Platelet - CMP14+EGFR - quinapril (ACCUPRIL) 40 MG tablet; Take 1 tablet (40 mg total) by mouth daily.  Dispense: 90 tablet; Refill: 1 - amLODipine (NORVASC) 5 MG tablet; Take 1 tablet (5 mg total) by mouth daily.  Dispense: 90 tablet; Refill: 1 - cloNIDine (CATAPRES) 0.2 MG tablet; Take 1 tablet (0.2 mg total) by mouth 2 (two) times daily.  Dispense: 180 tablet; Refill: 1  2. Mixed hyperlipidemia Low fat diet - Lipid panel - simvastatin (ZOCOR) 40 MG tablet; TAKE 1 TABLET EVERY DAY IN THE EVENING  Dispense: 90 tablet; Refill: 1  3. Type 2 diabetes mellitus without complication, without long-term current use of insulin (HCC) Continue to watch carbs in diet - Bayer DCA Hb A1c Waived - Microalbumin / creatinine urine ratio - glipiZIDE (GLUCOTROL) 5 MG tablet; Take 1 tablet (5 mg total) by mouth 2 (two) times daily.   Dispense: 180 tablet; Refill: 1 - metFORMIN (GLUCOPHAGE) 1000 MG tablet; Take 1 tablet (1,000 mg total) by mouth 2 (two) times daily with a meal.  Dispense: 180 tablet; Refill: 1  4. Persistent atrial fibrillation (HCC) Avoid caffeine - metoprolol tartrate (LOPRESSOR) 50 MG tablet; Take 1 tablet (50 mg total) by mouth 2 (two) times daily.  Dispense: 180 tablet; Refill: 1 - apixaban (ELIQUIS) 5 MG TABS tablet; Take 1 tablet (5 mg total) by mouth 2 (two) times daily.  Dispense: 180 tablet; Refill: 1  5. Peripheral edema Elevate legs when sitting - furosemide (LASIX) 20 MG tablet; Take 1 tablet (20 mg total) by mouth daily.  Dispense: 90 tablet; Refill: 0  6. Severe obesity (BMI >= 40) (HCC) Discussed diet and exercise for person with BMI >25 Will recheck weight in 3-6 months  7. GAD (generalized anxiety disorder) Stress management - ALPRAZolam (XANAX) 0.25 MG tablet; Take 1 tablet (0.25 mg total) by mouth daily as needed for anxiety.  Dispense: 30 tablet; Refill: 2    Labs pending Health Maintenance reviewed Diet and exercise encouraged  Follow up plan: 3 months   Mary-Margaret Hassell Done, FNP

## 2020-05-27 LAB — CMP14+EGFR
ALT: 21 IU/L (ref 0–32)
AST: 32 IU/L (ref 0–40)
Albumin/Globulin Ratio: 1.3 (ref 1.2–2.2)
Albumin: 4.1 g/dL (ref 3.8–4.8)
Alkaline Phosphatase: 63 IU/L (ref 44–121)
BUN/Creatinine Ratio: 28 (ref 12–28)
BUN: 19 mg/dL (ref 8–27)
Bilirubin Total: 0.5 mg/dL (ref 0.0–1.2)
CO2: 22 mmol/L (ref 20–29)
Calcium: 9.3 mg/dL (ref 8.7–10.3)
Chloride: 99 mmol/L (ref 96–106)
Creatinine, Ser: 0.68 mg/dL (ref 0.57–1.00)
Globulin, Total: 3.2 g/dL (ref 1.5–4.5)
Glucose: 277 mg/dL — ABNORMAL HIGH (ref 65–99)
Potassium: 4.3 mmol/L (ref 3.5–5.2)
Sodium: 140 mmol/L (ref 134–144)
Total Protein: 7.3 g/dL (ref 6.0–8.5)
eGFR: 95 mL/min/{1.73_m2} (ref >59)

## 2020-05-27 LAB — CBC WITH DIFFERENTIAL/PLATELET
Basophils Absolute: 0.1 10*3/uL (ref 0.0–0.2)
Basos: 1 %
EOS (ABSOLUTE): 0.1 10*3/uL (ref 0.0–0.4)
Eos: 2 %
Hematocrit: 41.9 % (ref 34.0–46.6)
Hemoglobin: 13.9 g/dL (ref 11.1–15.9)
Immature Grans (Abs): 0 10*3/uL (ref 0.0–0.1)
Immature Granulocytes: 0 %
Lymphocytes Absolute: 1.9 10*3/uL (ref 0.7–3.1)
Lymphs: 22 %
MCH: 31.2 pg (ref 26.6–33.0)
MCHC: 33.2 g/dL (ref 31.5–35.7)
MCV: 94 fL (ref 79–97)
Monocytes Absolute: 0.5 10*3/uL (ref 0.1–0.9)
Monocytes: 6 %
Neutrophils Absolute: 5.7 10*3/uL (ref 1.4–7.0)
Neutrophils: 69 %
Platelets: 242 10*3/uL (ref 150–450)
RBC: 4.46 x10E6/uL (ref 3.77–5.28)
RDW: 12.7 % (ref 11.7–15.4)
WBC: 8.3 10*3/uL (ref 3.4–10.8)

## 2020-05-27 LAB — LIPID PANEL
Chol/HDL Ratio: 3.1 ratio (ref 0.0–4.4)
Cholesterol, Total: 133 mg/dL (ref 100–199)
HDL: 43 mg/dL (ref >39)
LDL Chol Calc (NIH): 66 mg/dL (ref 0–99)
Triglycerides: 139 mg/dL (ref 0–149)
VLDL Cholesterol Cal: 24 mg/dL (ref 5–40)

## 2020-07-14 ENCOUNTER — Ambulatory Visit (INDEPENDENT_AMBULATORY_CARE_PROVIDER_SITE_OTHER): Payer: Medicare HMO

## 2020-07-14 VITALS — Wt 265.0 lb

## 2020-07-14 DIAGNOSIS — Z0000 Encounter for general adult medical examination without abnormal findings: Secondary | ICD-10-CM

## 2020-07-14 NOTE — Patient Instructions (Signed)
Ashley [name] , Thank you for taking time to come for your Medicare Wellness Visit. I appreciate your ongoing commitment to your health goals. Please review the following plan we discussed and let me know if I can assist you in the future.   Screening recommendations/referrals: Colonoscopy: Declined - she last did FOBT from [facility] 07/08/20 and will repeat these annually Mammogram: Done 01/16/2020 - Repeat annually Bone Density: Due every 2 years (let us know if you want one) Recommended yearly ophthalmology/optometry visit for glaucoma screening and checkup Recommended yearly dental visit for hygiene and checkup  Vaccinations: Influenza vaccine: Due every fall Pneumococcal vaccine: Done 05/03/2017 & 11/02/2018 Tdap vaccine: Done 08/18/2017 - Repeat in 10 years Shingles vaccine: Done 08/18/2017 & 11/07/2017   Covid-19: Done 03/13/2019 & 04/10/2019 - due for booster  Conditions/risks identified: Aim for 30 minutes of exercise or brisk walking each day, drink 6-8 glasses of water and eat lots of fruits and vegetables.  Next appointment: Follow up in one year for your annual wellness visit    Preventive Care 65 Years and Older, Female Preventive care refers to lifestyle choices and visits with your health care provider that can promote health and wellness. What does preventive care include?  A yearly physical exam. This is also called an annual well check.  Dental exams once or twice a year.  Routine eye exams. Ask your health care provider how often you should have your eyes checked.  Personal lifestyle choices, including:  Daily care of your teeth and gums.  Regular physical activity.  Eating a healthy diet.  Avoiding tobacco and drug use.  Limiting alcohol use.  Practicing safe sex.  Taking low-dose aspirin every day.  Taking vitamin and mineral supplements as recommended by your health care provider. What happens during an annual well check? The services and screenings done by your  health care provider during your annual well check will depend on your age, overall health, lifestyle risk factors, and family history of disease. Counseling  Your health care provider may ask you questions about your:  Alcohol use.  Tobacco use.  Drug use.  Emotional well-being.  Home and relationship well-being.  Sexual activity.  Eating habits.  History of falls.  Memory and ability to understand (cognition).  Work and work Statistician.  Reproductive health. Screening  You may have the following tests or measurements:  Height, weight, and BMI.  Blood pressure.  Lipid and cholesterol levels. These may be checked every 5 years, or more frequently if you are over 9 years old.  Skin check.  Lung cancer screening. You may have this screening every year starting at age 15 if you have a 30-pack-year history of smoking and currently smoke or have quit within the past 15 years.  Fecal occult blood test (FOBT) of the stool. You may have this test every year starting at age 23.  Flexible sigmoidoscopy or colonoscopy. You may have a sigmoidoscopy every 5 years or a colonoscopy every 10 years starting at age 24.  Hepatitis C blood test.  Hepatitis B blood test.  Sexually transmitted disease (STD) testing.  Diabetes screening. This is done by checking your blood sugar (glucose) after you have not eaten for a while (fasting). You may have this done every 1-3 years.  Bone density scan. This is done to screen for osteoporosis. You may have this done starting at age 70.  Mammogram. This may be done every 1-2 years. Talk to your health care provider about how often you should  have regular mammograms. Talk with your health care provider about your test results, treatment options, and if necessary, the need for more tests. Vaccines  Your health care provider may recommend certain vaccines, such as:  Influenza vaccine. This is recommended every year.  Tetanus, diphtheria, and  acellular pertussis (Tdap, Td) vaccine. You may need a Td booster every 10 years.  Zoster vaccine. You may need this after age 77.  Pneumococcal 13-valent conjugate (PCV13) vaccine. One dose is recommended after age 25.  Pneumococcal polysaccharide (PPSV23) vaccine. One dose is recommended after age 42. Talk to your health care provider about which screenings and vaccines you need and how often you need them. This information is not intended to replace advice given to you by your health care provider. Make sure you discuss any questions you have with your health care provider. Document Released: 02/20/2015 Document Revised: 10/14/2015 Document Reviewed: 11/25/2014 Elsevier Interactive Patient Education  2017 [employer] Prevention in the Home Falls can cause injuries. They can happen to people of all ages. There are many things you can do to make your home safe and to help prevent falls. What can I do on the outside of my home?  Regularly fix the edges of walkways and driveways and fix any cracks.  Remove anything that might make you trip as you walk through a door, such as a raised step or threshold.  Trim any bushes or trees on the path to your home.  Use bright outdoor lighting.  Clear any walking paths of anything that might make someone trip, such as rocks or tools.  Regularly check to see if handrails are loose or broken. Make sure that both sides of any steps have handrails.  Any raised decks and porches should have guardrails on the edges.  Have any leaves, snow, or ice cleared regularly.  Use sand or salt on walking paths during winter.  Clean up any spills in your garage right away. This includes oil or grease spills. What can I do in the bathroom?  Use night lights.  Install grab bars by the toilet and in the tub and shower. Do not use towel bars as grab bars.  Use non-skid mats or decals in the tub or shower.  If you need to sit down in the shower, use  a plastic, non-slip stool.  Keep the floor dry. Clean up any water that spills on the floor as soon as it happens.  Remove soap buildup in the tub or shower regularly.  Attach bath mats securely with double-sided non-slip rug tape.  Do not have throw rugs and other things on the floor that can make you trip. What can I do in the bedroom?  Use night lights.  Make sure that you have a light by your bed that is easy to reach.  Do not use any sheets or blankets that are too big for your bed. They should not hang down onto the floor.  Have a firm chair that has side arms. You can use this for support while you get dressed.  Do not have throw rugs and other things on the floor that can make you trip. What can I do in the kitchen?  Clean up any spills right away.  Avoid walking on wet floors.  Keep items that you use a lot in easy-to-reach places.  If you need to reach something above you, use a strong step stool that has a grab bar.  Keep electrical cords out of  the way.  Do not use floor polish or wax that makes floors slippery. If you must use wax, use non-skid floor wax.  Do not have throw rugs and other things on the floor that can make you trip. What can I do with my stairs?  Do not leave any items on the stairs.  Make sure that there are handrails on both sides of the stairs and use them. Fix handrails that are broken or loose. Make sure that handrails are as long as the stairways.  Check any carpeting to make sure that it is firmly attached to the stairs. Fix any carpet that is loose or worn.  Avoid having throw rugs at the top or bottom of the stairs. If you do have throw rugs, attach them to the floor with carpet tape.  Make sure that you have a light switch at the top of the stairs and the bottom of the stairs. If you do not have them, ask someone to add them for you. What else can I do to help prevent falls?  Wear shoes that:  Do not have high heels.  Have  rubber bottoms.  Are comfortable and fit you well.  Are closed at the toe. Do not wear sandals.  If you use a stepladder:  Make sure that it is fully opened. Do not climb a closed stepladder.  Make sure that both sides of the stepladder are locked into place.  Ask someone to hold it for you, if possible.  Clearly mark and make sure that you can see:  Any grab bars or handrails.  First and last steps.  Where the edge of each step is.  Use tools that help you move around (mobility aids) if they are needed. These include:  Canes.  Walkers.  Scooters.  Crutches.  Turn on the lights when you go into a dark area. Replace any light bulbs as soon as they burn out.  Set up your furniture so you have a clear path. Avoid moving your furniture around.  If any of your floors are uneven, fix them.  If there are any pets around you, be aware of where they are.  Review your medicines with your doctor. Some medicines can make you feel dizzy. This can increase your chance of falling. Ask your doctor what other things that you can do to help prevent falls. This information is not intended to replace advice given to you by your health care provider. Make sure you discuss any questions you have with your health care provider. Document Released: 11/20/2008 Document Revised: 07/02/2015 Document Reviewed: 02/28/2014 Elsevier Interactive Patient Education  2017 [employer].

## 2020-07-14 NOTE — Progress Notes (Signed)
Subjective:   Ashley [name] is a 70 y.o. female who presents for Medicare Annual (Subsequent) preventive examination.  Virtual Visit via Telephone Note  I connected with  Ashley [name] on 07/14/20 at 10:30 AM EDT by telephone and verified that I am speaking with the correct person using two identifiers.  Location: Patient: Home Provider: [facility] Persons participating in the virtual visit: patient/Nurse Health Advisor   I discussed the limitations, risks, security and privacy concerns of performing an evaluation and management service by telephone and the availability of in person appointments. The patient expressed understanding and agreed to proceed.  Interactive audio and video telecommunications were attempted between this nurse and patient, however failed, due to patient having technical difficulties OR patient did not have access to video capability.  We continued and completed visit with audio only.  Some vital signs may be absent or patient reported.   Saim Almanza E Brittish Bolinger, LPN   Review of Systems     Cardiac Risk Factors include: advanced age (>65mn, >>64women);diabetes mellitus;dyslipidemia;hypertension;obesity (BMI >30kg/m2);sedentary lifestyle     Objective:    Today's Vitals   07/14/20 1037  Weight: 265 lb (120.2 kg)   Body mass index is 48.47 kg/m.  Advanced Directives 04/16/2019 04/13/2018 04/20/2017  Does Patient Have a Medical Advance Directive? Yes Yes No  Type of AParamedicof ALos ArcosLiving will H[place]Living will -  Does patient want to make changes to medical advance directive? No - Patient declined No - Patient declined -  Copy of [place] Chart? Yes - validated most recent copy scanned in chart (See row information) No - copy requested -  Would patient like information on creating a medical advance directive? - - No - Patient declined    Current Medications (verified) Outpatient Encounter  Medications as of 07/14/2020  Medication Sig  . Accu-Chek Softclix Lancets lancets TEST BLOOD SUGAR TWICE DAILY Dx E11.9  . acetaminophen (TYLENOL) 500 MG tablet Take 1,000 mg by mouth every 6 (six) hours as needed (for pain/headaches.).  .Marland Kitchenalbuterol (VENTOLIN HFA) 108 (90 Base) MCG/ACT inhaler INHALE 2 PUFFS INTO THE LUNGS EVERY 6 HOURS AS NEEDED FOR WHEEZE OR SHORTNESS OF BREATH  . Alcohol Swabs (B-D SINGLE USE SWABS REGULAR) PADS CHECK BLOOD SUGAR TWICE DAILY Dx E11.9  . ALPRAZolam (XANAX) 0.25 MG tablet Take 1 tablet (0.25 mg total) by mouth daily as needed for anxiety.  .Marland KitchenamLODipine (NORVASC) 5 MG tablet Take 1 tablet (5 mg total) by mouth daily.  .Marland Kitchenapixaban (ELIQUIS) 5 MG TABS tablet Take 1 tablet (5 mg total) by mouth 2 (two) times daily.  . Blood Glucose Monitoring Suppl (ACCU-CHEK AVIVA PLUS) w/Device KIT USE AS DIRECTED  . Blood Glucose Monitoring Suppl (ACCU-CHEK GUIDE ME) w/Device KIT 1 kit by Does not apply route 2 (two) times daily.  .Marland KitchenCINNAMON PO Take 1 capsule by mouth daily.   . cloNIDine (CATAPRES) 0.2 MG tablet Take 1 tablet (0.2 mg total) by mouth 2 (two) times daily.  . clotrimazole-betamethasone (LOTRISONE) cream APPLY TO AFFECTED AREA TWICE A DAY  . furosemide (LASIX) 20 MG tablet Take 1 tablet (20 mg total) by mouth daily.  . Ginger, Zingiber officinalis, (GINGER ROOT PO) Take 1 capsule by mouth daily.   .Marland KitchenglipiZIDE (GLUCOTROL) 5 MG tablet Take 1 tablet (5 mg total) by mouth 2 (two) times daily.  .Marland Kitchenglucose blood (ACCU-CHEK AVIVA PLUS) test strip TEST BLOOD SUGAR TWICE DAILY Dx E11.9  . meloxicam (  MOBIC) 15 MG tablet TAKE 1 TABLET BY MOUTH EVERY DAY  . metFORMIN (GLUCOPHAGE) 1000 MG tablet Take 1 tablet (1,000 mg total) by mouth 2 (two) times daily with a meal.  . metoprolol tartrate (LOPRESSOR) 50 MG tablet Take 1 tablet (50 mg total) by mouth 2 (two) times daily.  Marland Kitchen nystatin cream (MYCOSTATIN) Apply 1 application topically 2 (two) times daily.  . quinapril (ACCUPRIL) 40  MG tablet Take 1 tablet (40 mg total) by mouth daily.  . simvastatin (ZOCOR) 40 MG tablet TAKE 1 TABLET EVERY DAY IN THE EVENING  . Turmeric Curcumin 500 MG CAPS Take 500 mg by mouth daily.    No facility-administered encounter medications on file as of 07/14/2020.    Allergies (verified) Ciprofloxacin hcl   History: Past Medical History:  Diagnosis Date  . Depression   . Diabetes mellitus without complication ([place])   . Hyperlipidemia   . Hypertension    Past Surgical History:  Procedure Laterality Date  . CYST EXCISION Left 03/15/2017   from buttocks  . ORIF ANKLE FRACTURE Left 04/20/2017   Procedure: OPEN REDUCTION INTERNAL FIXATION (ORIF) LEFT ANKLE FRACTURE;  Surgeon: Netta Cedars, MD;  Location: [facility];  Service: Orthopedics;  Laterality: Left;   Family History  Problem Relation Age of Onset  . Diabetes Mother   . CAD Mother        CABG at age 76  . Hypertension Mother   . Hyperlipidemia Mother   . Diabetes Father   . Heart disease Father        CHF  . Hypertension Father   . Diabetes Sister   . CAD Sister   . Hypertension Sister   . Diabetes Brother   . Hypertension Brother   . Healthy Sister    Social History   Socioeconomic History  . Marital status: Widowed    Spouse name: Not on file  . Number of children: 2  . Years of education: 50  . Highest education level: High school graduate  Occupational History  . Occupation: School bus Education administrator: Ambulance person  Tobacco Use  . Smoking status: Never Smoker  . Smokeless tobacco: Never Used  Vaping Use  . Vaping Use: Never used  Substance and Sexual Activity  . Alcohol use: No  . Drug use: No  . Sexual activity: Not Currently  Other Topics Concern  . Not on file  Social History Narrative   Lives alone. Driving bus part time. Daughter lives across the street.   Enjoys gardening and quilting. Physical activity limited due to ankle pain since fx in 2019   Social Determinants of Health    Financial Resource Strain: Low Risk   . Difficulty of Paying Living Expenses: Not hard at all  Food Insecurity: No Food Insecurity  . Worried About Charity fundraiser in the Last Year: Never true  . Ran Out of Food in the Last Year: Never true  Transportation Needs: No Transportation Needs  . Lack of Transportation (Medical): No  . Lack of Transportation (Non-Medical): No  Physical Activity: Insufficiently Active  . Days of Exercise per Week: 7 days  . Minutes of Exercise per Session: 10 min  Stress: No Stress Concern Present  . Feeling of Stress : Not at all  Social Connections: Moderately Integrated  . Frequency of Communication with Friends and Family: More than three times a week  . Frequency of Social Gatherings with Friends and Family: More than three times a  week  . Attends Religious Services: More than 4 times per year  . Active Member of Clubs or Organizations: Yes  . Attends Archivist Meetings: More than 4 times per year  . Marital Status: Widowed    Tobacco Counseling Counseling given: Not Answered   Clinical Intake:  Pre-visit preparation completed: Yes  Pain : No/denies pain     BMI - recorded: 48.47 Nutritional Status: BMI > 30  Obese Nutritional Risks: None Diabetes: Yes CBG done?: No Did pt. bring in CBG monitor from home?: No  How often do you need to have someone help you when you read instructions, pamphlets, or other written materials from your doctor or pharmacy?: 1 - Never  Nutrition Risk Assessment:  Has the patient had any N/V/D within the last 2 months?  No  Does the patient have any non-healing wounds?  No  Has the patient had any unintentional weight loss or weight gain?  No   Diabetes:  Is the patient diabetic?  Yes  If diabetic, was a CBG obtained today?  No  Did the patient bring in their glucometer from home?  No  How often do you monitor your CBG's? Maybe once a week.   Financial Strains and Diabetes  Management:  Are you having any financial strains with the device, your supplies or your medication? No .  Does the patient want to be seen by Chronic Care Management for management of their diabetes?  No  Would the patient like to be referred to a Nutritionist or for Diabetic Management?  No   Diabetic Exams:  Diabetic Eye Exam: Completed 09/22/2019   Diabetic Foot Exam: Completed 05/26/2020. Pt has been advised about the importance in completing this exam. Pt is scheduled for diabetic foot exam on next year.    Interpreter Needed?: No  Information entered by :: Jaise Moser, LPN   Activities of Daily Living In your present state of health, do you have any difficulty performing the following activities: 07/14/2020  Hearing? N  Vision? N  Difficulty concentrating or making decisions? Y  Walking or climbing stairs? N  Dressing or bathing? N  Doing errands, shopping? N  Preparing Food and eating ? N  Using the Toilet? N  In the past six months, have you accidently leaked urine? Y  Do you have problems with loss of bowel control? Y  Managing your Medications? N  Managing your Finances? N  Housekeeping or managing your Housekeeping? N  Some recent data might be hidden    Patient Care Team: Chevis Pretty, FNP as PCP - General (Nurse Practitioner) Arnoldo Lenis, MD as PCP - Cardiology (Cardiology) Netta Cedars, MD as Consulting Physician (Orthopedic Surgery) Harlen Labs, MD as Referring Physician (Optometry) Sandford Craze, MD as Referring Physician (Dermatology)  Indicate any recent Medical Services you may have received from other than [facility] providers in the past year (date may be approximate).     Assessment:   This is a routine wellness examination for [place].  Hearing/Vision screen  Hearing Screening   '125Hz'  '250Hz'  '500Hz'  '1000Hz'  '2000Hz'  '3000Hz'  '4000Hz'  '6000Hz'  '8000Hz'   Right ear:           Left ear:           Comments: Denies hearing difficulties    Vision Screening Comments: Wears otc reading glasses prn - up to date with annual eye exam with Dr Marin Comment in [place]   Dietary issues and exercise activities discussed: Current Exercise Habits: Home exercise  routine, Type of exercise: walking, Time (Minutes): 10, Frequency (Times/Week): 7, Weekly Exercise (Minutes/Week): 70, Intensity: Mild, Exercise limited by: orthopedic condition(s)  Goals Addressed            This Visit's Progress   . Exercise 3 times per week for 30 minutes    Not on track    Walking and riding stationary bicycle are great options.      Depression Screen PHQ 2/9 Scores 07/14/2020 05/26/2020 06/14/2019 04/16/2019 02/15/2019 11/02/2018 08/02/2018  PHQ - 2 Score 0 0 0 0 0 0 1  PHQ- 9 Score - - - - - - -  Exception Documentation - - - - - - -  Not completed - - - - - - -    Fall Risk Fall Risk  07/14/2020 05/26/2020 06/14/2019 04/16/2019 02/15/2019  Falls in the past year? 0 0 0 0 0  Number falls in past yr: 0 - - - -  Injury with Fall? 0 - - - -  Comment - - - - -  Risk for fall due to : Orthopedic patient - - - -  Follow up Falls prevention discussed - - - -    FALL [place]:  Any stairs in or around the home? Yes  If so, are there any without handrails? Yes  Home free of loose throw rugs in walkways, pet beds, electrical cords, etc? No  Adequate lighting in your home to reduce risk of falls? No   ASSISTIVE DEVICES UTILIZED TO PREVENT FALLS:  Life alert? No  Use of a cane, walker or w/c? No  Grab bars in the bathroom? No  Shower chair or bench in shower? Yes  Elevated toilet seat or a handicapped toilet? No   TIMED UP AND GO:  Was the test performed? No . Telephonic visit  Cognitive Function: Normal cognitive status assessed by direct observation by this Nurse Health Advisor. No abnormalities found.   MMSE - Mini Mental State Exam 04/13/2018 04/13/2018  Orientation to time 5 5  Orientation to Place 5 5  Registration 3 -  Attention/  Calculation 4 -  Recall 1 -  Language- name 2 objects 2 -  Language- repeat 1 -  Language- follow 3 step command 3 -  Language- read & follow direction 1 -  Write a sentence 1 -  Copy design 1 -  Total score 27 -     6CIT Screen 04/16/2019  What Year? 0 points  What month? 0 points  What time? 0 points  Count back from 20 0 points  Months in reverse 0 points  Repeat phrase 0 points  Total Score 0    Immunizations Immunization History  Administered Date(s) Administered  . Fluad Quad(high Dose 65+) 11/02/2018  . Influenza, High Dose Seasonal PF 11/07/2017  . Influenza,inj,Quad PF,6+ Mos 10/31/2012, 11/14/2014, 11/03/2016  . Influenza-Unspecified 10/25/2013, 11/03/2016  . Moderna Sars-Covid-2 Vaccination 03/13/2019, 04/10/2019  . Pneumococcal Conjugate-13 11/02/2018  . Pneumococcal Polysaccharide-23 10/31/2012, 05/03/2017  . Tdap 08/18/2017  . Zoster Recombinat (Shingrix) 08/18/2017, 11/07/2017  . Zoster, Live 01/08/2013    TDAP status: Up to date  Flu Vaccine status: Up to date  Pneumococcal vaccine status: Up to date  Covid-19 vaccine status: Completed vaccines  Qualifies for Shingles Vaccine? Yes   Zostavax completed Yes   Shingrix Completed?: Yes  Screening Tests Health Maintenance  Topic Date Due  . DEXA SCAN  06/06/2011  . Pneumococcal Vaccine 44-104 Years old (1 of 2 -  PPSV23) Never done  . COVID-19 Vaccine (3 - Booster for Moderna series) 09/10/2019  . COLONOSCOPY (Pts 45-72yr Insurance coverage will need to be confirmed)  05/26/2021 (Originally 03/22/2020)  . Hepatitis C Screening  05/26/2021 (Originally 04/06/1968)  . INFLUENZA VACCINE  09/07/2020  . OPHTHALMOLOGY EXAM  09/11/2020  . HEMOGLOBIN A1C  11/25/2020  . MAMMOGRAM  01/15/2021  . FOOT EXAM  05/26/2021  . TETANUS/TDAP  08/19/2027  . PNA vac Low Risk Adult  Completed  . Zoster Vaccines- Shingrix  Completed  . HPV VACCINES  Aged Out    Health Maintenance  Health Maintenance Due  Topic Date  Due  . DEXA SCAN  06/06/2011  . Pneumococcal Vaccine 03658Years old (1 of 2 - PPSV23) Never done  . COVID-19 Vaccine (3 - Booster for Moderna series) 09/10/2019    Colorectal cancer screening: Type of screening: FOBT/FIT. Completed 07/08/20. Repeat every 1 years  Mammogram status: Completed 02/14/2020. Repeat every year  Bone Density status: Completed 2011. Results reflect: Bone density results: OSTEOPOROSIS. Repeat every 2 years. She doesn't want to do this - doesn't feel it necessary  Lung Cancer Screening: (Low Dose CT Chest recommended if Age 68-80years, 30 pack-year currently smoking OR have quit w/in 15years.) does not qualify.   Additional Screening:  Hepatitis C Screening: does qualify; Due at next appt  Vision Screening: Recommended annual ophthalmology exams for early detection of glaucoma and other disorders of the eye. Is the patient up to date with their annual eye exam?  Yes  Who is the provider or what is the name of the office in which the patient attends annual eye exams? [place] pt is not established with a provider, would they like to be referred to a provider to establish care? No .   Dental Screening: Recommended annual dental exams for proper oral hygiene  Community Resource Referral / Chronic Care Management: CRR required this visit?  No   CCM required this visit?  No      Plan:     I have personally reviewed and noted the following in the patient's chart:   . Medical and social history . Use of alcohol, tobacco or illicit drugs  . Current medications and supplements including opioid prescriptions.  . Functional ability and status . Nutritional status . Physical activity . Advanced directives . List of other physicians . Hospitalizations, surgeries, and ER visits in previous 12 months . Vitals . Screenings to include cognitive, depression, and falls . Referrals and appointments  In addition, I have reviewed and discussed with patient  certain preventive protocols, quality metrics, and best practice recommendations. A written personalized care plan for preventive services as well as general preventive health recommendations were provided to patient.     ASandrea Hammond LPN   [id number]  Nurse Notes: None

## 2020-08-25 ENCOUNTER — Ambulatory Visit (INDEPENDENT_AMBULATORY_CARE_PROVIDER_SITE_OTHER): Payer: Medicare HMO | Admitting: Nurse Practitioner

## 2020-08-25 ENCOUNTER — Encounter: Payer: Self-pay | Admitting: Nurse Practitioner

## 2020-08-25 ENCOUNTER — Other Ambulatory Visit: Payer: Self-pay

## 2020-08-25 VITALS — BP 137/72 | HR 75 | Temp 98.2°F | Resp 20 | Ht 62.0 in | Wt 263.0 lb

## 2020-08-25 DIAGNOSIS — E1159 Type 2 diabetes mellitus with other circulatory complications: Secondary | ICD-10-CM

## 2020-08-25 DIAGNOSIS — R609 Edema, unspecified: Secondary | ICD-10-CM | POA: Diagnosis not present

## 2020-08-25 DIAGNOSIS — E782 Mixed hyperlipidemia: Secondary | ICD-10-CM

## 2020-08-25 DIAGNOSIS — I4819 Other persistent atrial fibrillation: Secondary | ICD-10-CM

## 2020-08-25 DIAGNOSIS — E119 Type 2 diabetes mellitus without complications: Secondary | ICD-10-CM

## 2020-08-25 DIAGNOSIS — I152 Hypertension secondary to endocrine disorders: Secondary | ICD-10-CM | POA: Diagnosis not present

## 2020-08-25 DIAGNOSIS — R600 Localized edema: Secondary | ICD-10-CM

## 2020-08-25 LAB — BAYER DCA HB A1C WAIVED: HB A1C (BAYER DCA - WAIVED): 7.2 % — ABNORMAL HIGH (ref <7.0)

## 2020-08-25 MED ORDER — METOPROLOL TARTRATE 50 MG PO TABS: 50.0000 mg | ORAL_TABLET | Freq: Two times a day (BID) | ORAL | 1 refills | Status: DC

## 2020-08-25 MED ORDER — SIMVASTATIN 40 MG PO TABS: ORAL_TABLET | ORAL | 1 refills | Status: DC

## 2020-08-25 MED ORDER — APIXABAN 5 MG PO TABS: 5.0000 mg | ORAL_TABLET | Freq: Two times a day (BID) | ORAL | 1 refills | Status: DC

## 2020-08-25 MED ORDER — GLIPIZIDE 5 MG PO TABS: 5.0000 mg | ORAL_TABLET | Freq: Two times a day (BID) | ORAL | 1 refills | Status: DC

## 2020-08-25 MED ORDER — METFORMIN HCL 1000 MG PO TABS: 1000.0000 mg | ORAL_TABLET | Freq: Two times a day (BID) | ORAL | 1 refills | Status: DC

## 2020-08-25 NOTE — Progress Notes (Signed)
Subjective:    Patient ID: Ashley [name], female    DOB: 1950/12/14, 70 y.o.   MRN: [id number]   Chief Complaint: Medical Management of Chronic Issues    HPI:  1. Hypertension associated with diabetes ([place]) No c/o chest pain, sob or headache. Does not check blood pressure at home. BP Readings from Last 3 Encounters:  08/25/20 137/72  05/26/20 132/77  10/21/19 132/67     2. Mixed hyperlipidemia Does not watch diet and does no dedicated exercise. Lab Results  Component Value Date   CHOL 133 05/26/2020   HDL 43 05/26/2020   LDLCALC 66 05/26/2020   TRIG 139 05/26/2020   CHOLHDL 3.1 05/26/2020     3. Type 2 diabetes mellitus without complication, without long-term current use of insulin (HCC) She does not check her blood sugars very often at home. She is asking about ozempic for weight loss. Lab Results  Component Value Date   HGBA1C 7.1 (H) 05/26/2020     4. Persistent atrial fibrillation (HCC) Denies palpitations or heart racing  5. Peripheral edema Has occasionally but not daily.  6. Severe obesity (BMI >= 40) (HCC) No recent weight changes Wt Readings from Last 3 Encounters:  08/25/20 263 lb (119.3 kg)  07/14/20 265 lb (120.2 kg)  05/26/20 265 lb (120.2 kg)   BMI Readings from Last 3 Encounters:  08/25/20 48.10 kg/m  07/14/20 48.47 kg/m  05/26/20 48.47 kg/m       Outpatient Encounter Medications as of 08/25/2020  Medication Sig   Accu-Chek Softclix Lancets lancets TEST BLOOD SUGAR TWICE DAILY Dx E11.9   acetaminophen (TYLENOL) 500 MG tablet Take 1,000 mg by mouth every 6 (six) hours as needed (for pain/headaches.).   albuterol (VENTOLIN HFA) 108 (90 Base) MCG/ACT inhaler INHALE 2 PUFFS INTO THE LUNGS EVERY 6 HOURS AS NEEDED FOR WHEEZE OR SHORTNESS OF BREATH   Alcohol Swabs (B-D SINGLE USE SWABS REGULAR) PADS CHECK BLOOD SUGAR TWICE DAILY Dx E11.9   ALPRAZolam (XANAX) 0.25 MG tablet Take 1 tablet (0.25 mg total) by mouth daily as needed for anxiety.    amLODipine (NORVASC) 5 MG tablet Take 1 tablet (5 mg total) by mouth daily.   apixaban (ELIQUIS) 5 MG TABS tablet Take 1 tablet (5 mg total) by mouth 2 (two) times daily.   Blood Glucose Monitoring Suppl (ACCU-CHEK AVIVA PLUS) w/Device KIT USE AS DIRECTED   Blood Glucose Monitoring Suppl (ACCU-CHEK GUIDE ME) w/Device KIT 1 kit by Does not apply route 2 (two) times daily.   CINNAMON PO Take 1 capsule by mouth daily.    cloNIDine (CATAPRES) 0.2 MG tablet Take 1 tablet (0.2 mg total) by mouth 2 (two) times daily.   clotrimazole-betamethasone (LOTRISONE) cream APPLY TO AFFECTED AREA TWICE A DAY   furosemide (LASIX) 20 MG tablet Take 1 tablet (20 mg total) by mouth daily.   Ginger, Zingiber officinalis, (GINGER ROOT PO) Take 1 capsule by mouth daily.    glipiZIDE (GLUCOTROL) 5 MG tablet Take 1 tablet (5 mg total) by mouth 2 (two) times daily.   glucose blood (ACCU-CHEK AVIVA PLUS) test strip TEST BLOOD SUGAR TWICE DAILY Dx E11.9   meloxicam (MOBIC) 15 MG tablet TAKE 1 TABLET BY MOUTH EVERY DAY   metFORMIN (GLUCOPHAGE) 1000 MG tablet Take 1 tablet (1,000 mg total) by mouth 2 (two) times daily with a meal.   metoprolol tartrate (LOPRESSOR) 50 MG tablet Take 1 tablet (50 mg total) by mouth 2 (two) times daily.   nystatin cream (MYCOSTATIN) Apply  1 application topically 2 (two) times daily.   quinapril (ACCUPRIL) 40 MG tablet Take 1 tablet (40 mg total) by mouth daily.   simvastatin (ZOCOR) 40 MG tablet TAKE 1 TABLET EVERY DAY IN THE EVENING   Turmeric Curcumin 500 MG CAPS Take 500 mg by mouth daily.    No facility-administered encounter medications on file as of 08/25/2020.    Past Surgical History:  Procedure Laterality Date   CYST EXCISION Left 03/15/2017   from buttocks   ORIF ANKLE FRACTURE Left 04/20/2017   Procedure: OPEN REDUCTION INTERNAL FIXATION (ORIF) LEFT ANKLE FRACTURE;  Surgeon: Netta Cedars, MD;  Location: [facility];  Service: Orthopedics;  Laterality: Left;    Family History   Problem Relation Age of Onset   Diabetes Mother    CAD Mother        CABG at age 60   Hypertension Mother    Hyperlipidemia Mother    Diabetes Father    Heart disease Father        CHF   Hypertension Father    Diabetes Sister    CAD Sister    Hypertension Sister    Diabetes Brother    Hypertension Brother    Healthy Sister     New complaints: None today  Social history: Lives by herself. Is a bus driver  Controlled substance contract: n/a     Review of Systems  Constitutional:  Negative for diaphoresis.  Eyes:  Negative for pain.  Respiratory:  Negative for shortness of breath.   Cardiovascular:  Negative for chest pain, palpitations and leg swelling.  Gastrointestinal:  Negative for abdominal pain.  Endocrine: Negative for polydipsia.  Skin:  Negative for rash.  Neurological:  Negative for dizziness, weakness and headaches.  Hematological:  Does not bruise/bleed easily.  All other systems reviewed and are negative.     Objective:   Physical Exam Vitals and nursing note reviewed.  Constitutional:      General: She is not in acute distress.    Appearance: Normal appearance. She is well-developed.  HENT:     Head: Normocephalic.     Right Ear: Tympanic membrane normal.     Left Ear: Tympanic membrane normal.     Nose: Nose normal.     Mouth/Throat:     Mouth: Mucous membranes are moist.  Eyes:     Pupils: Pupils are equal, round, and reactive to light.  Neck:     Vascular: No carotid bruit or JVD.  Cardiovascular:     Rate and Rhythm: Normal rate and regular rhythm.     Heart sounds: Normal heart sounds.  Pulmonary:     Effort: Pulmonary effort is normal. No respiratory distress.     Breath sounds: Normal breath sounds. No wheezing or rales.  Chest:     Chest wall: No tenderness.  Abdominal:     General: Bowel sounds are normal. There is no distension or abdominal bruit.     Palpations: Abdomen is soft. There is no hepatomegaly, splenomegaly, mass  or pulsatile mass.     Tenderness: There is no abdominal tenderness.     Comments: penndulus abdomen  Musculoskeletal:        General: Normal range of motion.     Cervical back: Normal range of motion and neck supple.     Right lower leg: Edema (1+) present.     Left lower leg: Edema (1+) present.  Lymphadenopathy:     Cervical: No cervical adenopathy.  Skin:  General: Skin is warm and dry.  Neurological:     Mental Status: She is alert and oriented to person, place, and time.     Deep Tendon Reflexes: Reflexes are normal and symmetric.  Psychiatric:        Behavior: Behavior normal.        Thought Content: Thought content normal.        Judgment: Judgment normal.   BP 137/72   Pulse 75   Temp 98.2 F (36.8 C) (Temporal)   Resp 20   Ht _0  (1.575 m)   Wt 263 lb (119.3 kg)   SpO2 98%   BMI 48.10 kg/m   HGBA1c 7.2%       Assessment & Plan:  Esmee Fallaw comes in today with chief complaint of Medical Management of Chronic Issues   Diagnosis and orders addressed:  1. Hypertension associated with diabetes ([place]) Low sodium diet - CBC with Differential/Platelet - CMP14+EGFR  2. Mixed hyperlipidemia Low fat diet - Lipid panel - simvastatin (ZOCOR) 40 MG tablet; TAKE 1 TABLET EVERY DAY IN THE EVENING  Dispense: 90 tablet; Refill: 1  3. Type 2 diabetes mellitus without complication, without long-term current use of insulin (HCC) Continue to watch carbs in diet Patient will think about starting ozempic. She is afraid of givieng herself a shot. - Bayer DCA Hb A1c Waived - Microalbumin / creatinine urine ratio - glipiZIDE (GLUCOTROL) 5 MG tablet; Take 1 tablet (5 mg total) by mouth 2 (two) times daily.  Dispense: 180 tablet; Refill: 1 - metFORMIN (GLUCOPHAGE) 1000 MG tablet; Take 1 tablet (1,000 mg total) by mouth 2 (two) times daily with a meal.  Dispense: 180 tablet; Refill: 1  4. Persistent atrial fibrillation (HCC) Avoid caffeine - metoprolol tartrate  (LOPRESSOR) 50 MG tablet; Take 1 tablet (50 mg total) by mouth 2 (two) times daily.  Dispense: 180 tablet; Refill: 1 - apixaban (ELIQUIS) 5 MG TABS tablet; Take 1 tablet (5 mg total) by mouth 2 (two) times daily.  Dispense: 180 tablet; Refill: 1   5. Peripheral edema Elevate legs when sitting  6. Severe obesity (BMI >= 40) (HCC) Discussed diet and exercise for person with BMI >25 Will recheck weight in 3-6 months   Labs pending Health Maintenance reviewed Diet and exercise encouraged  Follow up plan: 3 months   Mary-Margaret Hassell Done, FNP

## 2020-08-25 NOTE — Patient Instructions (Signed)

## 2020-08-26 LAB — CBC WITH DIFFERENTIAL/PLATELET
Basophils Absolute: 0.1 10*3/uL (ref 0.0–0.2)
Basos: 1 %
EOS (ABSOLUTE): 0.2 10*3/uL (ref 0.0–0.4)
Eos: 2 %
Hematocrit: 42.8 % (ref 34.0–46.6)
Hemoglobin: 13.8 g/dL (ref 11.1–15.9)
Immature Grans (Abs): 0 10*3/uL (ref 0.0–0.1)
Immature Granulocytes: 0 %
Lymphocytes Absolute: 2.3 10*3/uL (ref 0.7–3.1)
Lymphs: 29 %
MCH: 30.1 pg (ref 26.6–33.0)
MCHC: 32.2 g/dL (ref 31.5–35.7)
MCV: 93 fL (ref 79–97)
Monocytes Absolute: 0.6 10*3/uL (ref 0.1–0.9)
Monocytes: 8 %
Neutrophils Absolute: 4.7 10*3/uL (ref 1.4–7.0)
Neutrophils: 60 %
Platelets: 230 10*3/uL (ref 150–450)
RBC: 4.59 x10E6/uL (ref 3.77–5.28)
RDW: 13.6 % (ref 11.7–15.4)
WBC: 7.9 10*3/uL (ref 3.4–10.8)

## 2020-08-26 LAB — CMP14+EGFR
ALT: 21 IU/L (ref 0–32)
AST: 33 IU/L (ref 0–40)
Albumin/Globulin Ratio: 1.6 (ref 1.2–2.2)
Albumin: 4.2 g/dL (ref 3.8–4.8)
Alkaline Phosphatase: 57 IU/L (ref 44–121)
BUN/Creatinine Ratio: 28 (ref 12–28)
BUN: 18 mg/dL (ref 8–27)
Bilirubin Total: 0.7 mg/dL (ref 0.0–1.2)
CO2: 23 mmol/L (ref 20–29)
Calcium: 9.8 mg/dL (ref 8.7–10.3)
Chloride: 102 mmol/L (ref 96–106)
Creatinine, Ser: 0.64 mg/dL (ref 0.57–1.00)
Globulin, Total: 2.6 g/dL (ref 1.5–4.5)
Glucose: 174 mg/dL — ABNORMAL HIGH (ref 65–99)
Potassium: 4.1 mmol/L (ref 3.5–5.2)
Sodium: 141 mmol/L (ref 134–144)
Total Protein: 6.8 g/dL (ref 6.0–8.5)
eGFR: 96 mL/min/{1.73_m2} (ref >59)

## 2020-08-26 LAB — LIPID PANEL
Chol/HDL Ratio: 3 ratio (ref 0.0–4.4)
Cholesterol, Total: 118 mg/dL (ref 100–199)
HDL: 39 mg/dL — ABNORMAL LOW (ref >39)
LDL Chol Calc (NIH): 55 mg/dL (ref 0–99)
Triglycerides: 136 mg/dL (ref 0–149)
VLDL Cholesterol Cal: 24 mg/dL (ref 5–40)

## 2020-08-26 LAB — MICROALBUMIN / CREATININE URINE RATIO
Creatinine, Urine: 149.7 mg/dL
Microalb/Creat Ratio: 13 mg/g creat (ref 0–29)
Microalbumin, Urine: 20.2 ug/mL

## 2020-09-14 DIAGNOSIS — E119 Type 2 diabetes mellitus without complications: Secondary | ICD-10-CM | POA: Diagnosis not present

## 2020-09-14 DIAGNOSIS — H40033 Anatomical narrow angle, bilateral: Secondary | ICD-10-CM | POA: Diagnosis not present

## 2020-10-14 ENCOUNTER — Other Ambulatory Visit: Payer: Self-pay | Admitting: Nurse Practitioner

## 2020-10-14 DIAGNOSIS — R609 Edema, unspecified: Secondary | ICD-10-CM

## 2020-10-26 ENCOUNTER — Other Ambulatory Visit: Payer: Self-pay | Admitting: Nurse Practitioner

## 2020-10-26 DIAGNOSIS — I10 Essential (primary) hypertension: Secondary | ICD-10-CM

## 2020-11-11 ENCOUNTER — Other Ambulatory Visit: Payer: Self-pay | Admitting: Nurse Practitioner

## 2020-11-11 DIAGNOSIS — E782 Mixed hyperlipidemia: Secondary | ICD-10-CM

## 2020-11-24 ENCOUNTER — Other Ambulatory Visit: Payer: Self-pay

## 2020-11-24 ENCOUNTER — Encounter: Payer: Self-pay | Admitting: Nurse Practitioner

## 2020-11-24 ENCOUNTER — Ambulatory Visit (INDEPENDENT_AMBULATORY_CARE_PROVIDER_SITE_OTHER): Payer: Medicare HMO | Admitting: Nurse Practitioner

## 2020-11-24 VITALS — BP 150/84 | HR 69 | Temp 97.2°F | Resp 20 | Ht 62.0 in | Wt 261.0 lb

## 2020-11-24 DIAGNOSIS — M25551 Pain in right hip: Secondary | ICD-10-CM | POA: Diagnosis not present

## 2020-11-24 DIAGNOSIS — Z23 Encounter for immunization: Secondary | ICD-10-CM | POA: Diagnosis not present

## 2020-11-24 MED ORDER — METHYLPREDNISOLONE ACETATE 80 MG/ML IJ SUSP
80.0000 mg | Freq: Once | INTRAMUSCULAR | Status: AC
  Administered 2020-11-24: 80 mg via INTRAMUSCULAR

## 2020-11-24 NOTE — Patient Instructions (Signed)
Hip Pain The hip is the joint between the upper legs and the lower pelvis. The bones, cartilage, tendons, and muscles of your hip joint support your body and allow you to move around. Hip pain can range from a minor ache to severe pain in one or both of your hips. The pain may be felt on the inside of the hip joint near the groin, or on the outside near the buttocks and upper thigh. You may also have swelling or stiffness in your hip area. Follow these instructions at home: Managing pain, stiffness, and swelling   If directed, put ice on the painful area. To do this: Put ice in a plastic bag. Place a towel between your skin and the bag. Leave the ice on for 20 minutes, 2-3 times a day. If directed, apply heat to the affected area as often as told by your health care provider. Use the heat source that your health care provider recommends, such as a moist heat pack or a heating pad. Place a towel between your skin and the heat source. Leave the heat on for 20-30 minutes. Remove the heat if your skin turns bright red. This is especially important if you are unable to feel pain, heat, or cold. You may have a greater risk of getting burned. Activity Do exercises as told by your health care provider. Avoid activities that cause pain. General instructions  Take over-the-counter and prescription medicines only as told by your health care provider. Keep a journal of your symptoms. Write down: How often you have hip pain. The location of your pain. What the pain feels like. What makes the pain worse. Sleep with a pillow between your legs on your most comfortable side. Keep all follow-up visits as told by your health care provider. This is important. Contact a health care provider if: You cannot put weight on your leg. Your pain or swelling continues or gets worse after one week. It gets harder to walk. You have a fever. Get help right away if: You fall. You have a sudden increase in pain and  swelling in your hip. Your hip is red or swollen or very tender to touch. Summary Hip pain can range from a minor ache to severe pain in one or both of your hips. The pain may be felt on the inside of the hip joint near the groin, or on the outside near the buttocks and upper thigh. Avoid activities that cause pain. Write down how often you have hip pain, the location of the pain, what makes it worse, and what it feels like. This information is not intended to replace advice given to you by your health care provider. Make sure you discuss any questions you have with your health care provider. Document Revised: 06/11/2018 Document Reviewed: 06/11/2018 Elsevier Patient Education  2022 [employer] Inc.  

## 2020-11-24 NOTE — Progress Notes (Signed)
   Subjective:    Patient ID: Ashley [name], female    DOB: March 19, 1950, 70 y.o.   MRN: [id number]  Chief Complaint: Hip Pain (Right/)   HPI Patient comes in today c/o right hip pain. She has had this on the past and steroid injection helped. Pain started about 3 weeks ago and has gotten worse. She has used horse liniment and ice. No better. Rates pain 7-8/10. Standing and walking increases pain. Sitting helps with pain.  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   Review of Systems  Constitutional:  Negative for diaphoresis.  Eyes:  Negative for pain.  Respiratory:  Negative for shortness of breath.   Cardiovascular:  Negative for chest pain, palpitations and leg swelling.  Gastrointestinal:  Negative for abdominal pain.  Endocrine: Negative for polydipsia.  Musculoskeletal:  Positive for arthralgias (right hip).  Skin:  Negative for rash.  Neurological:  Negative for dizziness, weakness and headaches.  Hematological:  Does not bruise/bleed easily.  All other systems reviewed and are negative.     Objective:   Physical Exam Vitals reviewed.  Constitutional:      Appearance: Normal appearance.  Cardiovascular:     Rate and Rhythm: Normal rate.     Heart sounds: Normal heart sounds.  Pulmonary:     Breath sounds: Normal breath sounds.  Musculoskeletal:     Comments: FROM of right hip with pain on abduction and internal rotation.  Skin:    General: Skin is warm.  Neurological:     General: No focal deficit present.     Mental Status: She is alert.  Psychiatric:        Mood and Affect: Mood normal.    BP (!) 150/84   Pulse 69   Temp (!) 97.2 F (36.2 C) (Temporal)   Resp 20   Ht 5\' 2"  (1.575 m)   Wt 261 lb (118.4 kg)   SpO2 95%   BMI 47.74 kg/m        Assessment & Plan:   Ashley [name] in today with chief complaint of Hip Pain (Right/)   1. Acute right hip pain Voltaren gel will be fine Ice Rest  RTO prn - methylPREDNISolone acetate  (DEPO-MEDROL) injection 80 mg    The above assessment and management plan was discussed with the patient. The patient verbalized understanding of and has agreed to the management plan. Patient is aware to call the clinic if symptoms persist or worsen. Patient is aware when to return to the clinic for a follow-up visit. Patient educated on when it is appropriate to go to the emergency department.   Mary-Margaret Hassell Done, FNP

## 2020-11-30 ENCOUNTER — Encounter: Payer: Self-pay | Admitting: Nurse Practitioner

## 2020-11-30 ENCOUNTER — Other Ambulatory Visit: Payer: Self-pay

## 2020-11-30 ENCOUNTER — Ambulatory Visit (INDEPENDENT_AMBULATORY_CARE_PROVIDER_SITE_OTHER): Payer: Medicare HMO | Admitting: Nurse Practitioner

## 2020-11-30 ENCOUNTER — Ambulatory Visit (INDEPENDENT_AMBULATORY_CARE_PROVIDER_SITE_OTHER): Payer: Medicare HMO

## 2020-11-30 VITALS — BP 133/73 | HR 69 | Temp 98.3°F | Resp 20 | Ht 62.0 in | Wt 262.0 lb

## 2020-11-30 DIAGNOSIS — Z780 Asymptomatic menopausal state: Secondary | ICD-10-CM | POA: Diagnosis not present

## 2020-11-30 DIAGNOSIS — I4819 Other persistent atrial fibrillation: Secondary | ICD-10-CM | POA: Diagnosis not present

## 2020-11-30 DIAGNOSIS — E1159 Type 2 diabetes mellitus with other circulatory complications: Secondary | ICD-10-CM

## 2020-11-30 DIAGNOSIS — R609 Edema, unspecified: Secondary | ICD-10-CM | POA: Diagnosis not present

## 2020-11-30 DIAGNOSIS — Z13820 Encounter for screening for osteoporosis: Secondary | ICD-10-CM

## 2020-11-30 DIAGNOSIS — E119 Type 2 diabetes mellitus without complications: Secondary | ICD-10-CM | POA: Diagnosis not present

## 2020-11-30 DIAGNOSIS — E782 Mixed hyperlipidemia: Secondary | ICD-10-CM

## 2020-11-30 DIAGNOSIS — I152 Hypertension secondary to endocrine disorders: Secondary | ICD-10-CM | POA: Diagnosis not present

## 2020-11-30 LAB — LIPID PANEL
Chol/HDL Ratio: 2.5 ratio (ref 0.0–4.4)
Cholesterol, Total: 134 mg/dL (ref 100–199)
HDL: 54 mg/dL (ref >39)
LDL Chol Calc (NIH): 56 mg/dL (ref 0–99)
Triglycerides: 138 mg/dL (ref 0–149)
VLDL Cholesterol Cal: 24 mg/dL (ref 5–40)

## 2020-11-30 LAB — CBC WITH DIFFERENTIAL/PLATELET
Basophils Absolute: 0.1 10*3/uL (ref 0.0–0.2)
Basos: 1 %
EOS (ABSOLUTE): 0.1 10*3/uL (ref 0.0–0.4)
Eos: 1 %
Hematocrit: 39.6 % (ref 34.0–46.6)
Hemoglobin: 13.3 g/dL (ref 11.1–15.9)
Immature Grans (Abs): 0 10*3/uL (ref 0.0–0.1)
Immature Granulocytes: 1 %
Lymphocytes Absolute: 2.1 10*3/uL (ref 0.7–3.1)
Lymphs: 27 %
MCH: 31 pg (ref 26.6–33.0)
MCHC: 33.6 g/dL (ref 31.5–35.7)
MCV: 92 fL (ref 79–97)
Monocytes Absolute: 0.5 10*3/uL (ref 0.1–0.9)
Monocytes: 6 %
Neutrophils Absolute: 4.9 10*3/uL (ref 1.4–7.0)
Neutrophils: 64 %
Platelets: 206 10*3/uL (ref 150–450)
RBC: 4.29 x10E6/uL (ref 3.77–5.28)
RDW: 12.7 % (ref 11.7–15.4)
WBC: 7.7 10*3/uL (ref 3.4–10.8)

## 2020-11-30 LAB — CMP14+EGFR
ALT: 15 IU/L (ref 0–32)
AST: 26 IU/L (ref 0–40)
Albumin/Globulin Ratio: 1.3 (ref 1.2–2.2)
Albumin: 4.1 g/dL (ref 3.8–4.8)
Alkaline Phosphatase: 63 IU/L (ref 44–121)
BUN/Creatinine Ratio: 24 (ref 12–28)
BUN: 14 mg/dL (ref 8–27)
Bilirubin Total: 0.5 mg/dL (ref 0.0–1.2)
CO2: 25 mmol/L (ref 20–29)
Calcium: 9.2 mg/dL (ref 8.7–10.3)
Chloride: 103 mmol/L (ref 96–106)
Creatinine, Ser: 0.59 mg/dL (ref 0.57–1.00)
Globulin, Total: 3.2 g/dL (ref 1.5–4.5)
Glucose: 235 mg/dL — ABNORMAL HIGH (ref 70–99)
Potassium: 4.7 mmol/L (ref 3.5–5.2)
Sodium: 141 mmol/L (ref 134–144)
Total Protein: 7.3 g/dL (ref 6.0–8.5)
eGFR: 98 mL/min/{1.73_m2} (ref >59)

## 2020-11-30 LAB — BAYER DCA HB A1C WAIVED: HB A1C (BAYER DCA - WAIVED): 6.9 % — ABNORMAL HIGH (ref 4.8–5.6)

## 2020-11-30 MED ORDER — GLIPIZIDE 5 MG PO TABS: 5.0000 mg | ORAL_TABLET | Freq: Two times a day (BID) | ORAL | 1 refills | Status: DC

## 2020-11-30 MED ORDER — APIXABAN 5 MG PO TABS: 5.0000 mg | ORAL_TABLET | Freq: Two times a day (BID) | ORAL | 1 refills | Status: DC

## 2020-11-30 MED ORDER — AMLODIPINE BESYLATE 5 MG PO TABS: 5.0000 mg | ORAL_TABLET | Freq: Every day | ORAL | 1 refills | Status: DC

## 2020-11-30 MED ORDER — METFORMIN HCL 1000 MG PO TABS: 1000.0000 mg | ORAL_TABLET | Freq: Two times a day (BID) | ORAL | 1 refills | Status: DC

## 2020-11-30 MED ORDER — SIMVASTATIN 40 MG PO TABS: ORAL_TABLET | ORAL | 1 refills | Status: DC

## 2020-11-30 MED ORDER — CLONIDINE HCL 0.2 MG PO TABS: 0.2000 mg | ORAL_TABLET | Freq: Two times a day (BID) | ORAL | 1 refills | Status: DC

## 2020-11-30 MED ORDER — QUINAPRIL HCL 40 MG PO TABS: 40.0000 mg | ORAL_TABLET | Freq: Every day | ORAL | 1 refills | Status: DC

## 2020-11-30 MED ORDER — METOPROLOL TARTRATE 50 MG PO TABS: 50.0000 mg | ORAL_TABLET | Freq: Two times a day (BID) | ORAL | 1 refills | Status: DC

## 2020-11-30 MED ORDER — FUROSEMIDE 20 MG PO TABS: 20.0000 mg | ORAL_TABLET | Freq: Every day | ORAL | 1 refills | Status: DC

## 2020-11-30 NOTE — Patient Instructions (Signed)
Atrial Fibrillation Atrial fibrillation is a type of heartbeat that is irregular or fast. If you have this condition, your heart beats without any order. This makes it hard for your heart to pump blood in a normal way. Atrial fibrillation may come and go, or it may become a long-lasting problem. If this condition is not treated, it can put you at higher risk for stroke, heart failure, and other heart problems. What are the causes? This condition may be caused by diseases that damage the heart. They include: High blood pressure. Heart failure. Heart valve disease. Heart surgery. Other causes include: Diabetes. Thyroid disease. Being overweight. Kidney disease. Sometimes the cause is not known. What increases the risk? You are more likely to develop this condition if: You are older. You smoke. You exercise often and very hard. You have a family history of this condition. You are a man. You use drugs. You drink a lot of alcohol. You have lung conditions, such as emphysema, pneumonia, or COPD. You have sleep apnea. What are the signs or symptoms? Common symptoms of this condition include: A feeling that your heart is beating very fast. Chest pain or discomfort. Feeling short of breath. Suddenly feeling light-headed or weak. Getting tired easily during activity. Fainting. Sweating. In some cases, there are no symptoms. How is this treated? Treatment for this condition depends on underlying conditions and how you feel when you have atrial fibrillation. They include: Medicines to: Prevent blood clots. Treat heart rate or heart rhythm problems. Using devices, such as a pacemaker, to correct heart rhythm problems. Doing surgery to remove the part of the heart that sends bad signals. Closing an area where clots can form in the heart (left atrial appendage). In some cases, your doctor will treat other underlying conditions. Follow these instructions at home: Medicines Take  over-the-counter and prescription medicines only as told by your doctor. Do not take any new medicines without first talking to your doctor. If you are taking blood thinners: Talk with your doctor before you take any medicines that have aspirin or NSAIDs, such as ibuprofen, in them. Take your medicine exactly as told by your doctor. Take it at the same time each day. Avoid activities that could hurt or bruise you. Follow instructions about how to prevent falls. Wear a bracelet that says you are taking blood thinners. Or, carry a card that lists what medicines you take. Lifestyle   Do not use any products that have nicotine or tobacco in them. These include cigarettes, e-cigarettes, and chewing tobacco. If you need help quitting, ask your doctor. Eat heart-healthy foods. Talk with your doctor about the right eating plan for you. Exercise regularly as told by your doctor. Do not drink alcohol. Lose weight if you are overweight. Do not use drugs, including cannabis. General instructions If you have a condition that causes breathing to stop for a short period of time (apnea), treat it as told by your doctor. Keep a healthy weight. Do not use diet pills unless your doctor says they are safe for you. Diet pills may make heart problems worse. Keep all follow-up visits as told by your doctor. This is important. Contact a doctor if: You notice a change in the speed, rhythm, or strength of your heartbeat. You are taking a blood-thinning medicine and you get more bruising. You get tired more easily when you move or exercise. You have a sudden change in weight. Get help right away if:  You have pain in your chest or  your belly (abdomen). You have trouble breathing. You have side effects of blood thinners, such as blood in your vomit, poop (stool), or pee (urine), or bleeding that cannot stop. You have any signs of a stroke. "BE FAST" is an easy way to remember the main warning signs: B - Balance.  Signs are dizziness, sudden trouble walking, or loss of balance. E - Eyes. Signs are trouble seeing or a change in how you see. F - Face. Signs are sudden weakness or loss of feeling in the face, or the face or eyelid drooping on one side. A - Arms. Signs are weakness or loss of feeling in an arm. This happens suddenly and usually on one side of the body. S - Speech. Signs are sudden trouble speaking, slurred speech, or trouble understanding what people say. T - Time. Time to call emergency services. Write down what time symptoms started. You have other signs of a stroke, such as: A sudden, very bad headache with no known cause. Feeling like you may vomit (nausea). Vomiting. A seizure. These symptoms may be an emergency. Do not wait to see if the symptoms will go away. Get medical help right away. Call your local emergency services (911 in the [place] Do not drive yourself to the hospital. Summary Atrial fibrillation is a type of heartbeat that is irregular or fast. You are at higher risk of this condition if you smoke, are older, have diabetes, or are overweight. Follow your doctor's instructions about medicines, diet, exercise, and follow-up visits. Get help right away if you have signs or symptoms of a stroke. Get help right away if you cannot catch your breath, or you have chest pain or discomfort. This information is not intended to replace advice given to you by your health care provider. Make sure you discuss any questions you have with your health care provider. Document Revised: 07/18/2018 Document Reviewed: 07/18/2018 Elsevier Patient Education  [place].

## 2020-11-30 NOTE — Progress Notes (Addendum)
Subjective:    Patient ID: Ashley [name], female    DOB: 1950/06/02, 70 y.o.   MRN: [id number]  Chief Complaint: Medical Management of Chronic Issues    HPI:  1. Hypertension associated with diabetes ([place]) No c/o chest pain, sob or headache. Does not check blood pressure at home. BP Readings from Last 3 Encounters:  11/30/20 133/73  11/24/20 (!) 150/84  08/25/20 137/72     2. Mixed hyperlipidemia Does not watch diet and does little to no exercise. Lab Results  Component Value Date   CHOL 118 08/25/2020   HDL 39 (L) 08/25/2020   LDLCALC 55 08/25/2020   TRIG 136 08/25/2020   CHOLHDL 3.0 08/25/2020     3. Type 2 diabetes mellitus without complication, without long-term current use of insulin (HCC) Does not check her blood sugars very often at home. Really does not watch diet either. Lab Results  Component Value Date   HGBA1C 7.2 (H) 08/25/2020      4. Persistent atrial fibrillation ([place]) Denies heart racing or palpitations. Is on eliquis without any bleeding problems  5. Peripheral edema Has daily swelling  6. Severe obesity (BMI >= 40) (HCC) No recent weight changes Wt Readings from Last 3 Encounters:  11/30/20 262 lb (118.8 kg)  11/24/20 261 lb (118.4 kg)  08/25/20 263 lb (119.3 kg)   BMI Readings from Last 3 Encounters:  11/30/20 47.92 kg/m  11/24/20 47.74 kg/m  08/25/20 48.10 kg/m       Outpatient Encounter Medications as of 11/30/2020  Medication Sig   Accu-Chek Softclix Lancets lancets TEST BLOOD SUGAR TWICE DAILY Dx E11.9   acetaminophen (TYLENOL) 500 MG tablet Take 1,000 mg by mouth every 6 (six) hours as needed (for pain/headaches.).   albuterol (VENTOLIN HFA) 108 (90 Base) MCG/ACT inhaler INHALE 2 PUFFS INTO THE LUNGS EVERY 6 HOURS AS NEEDED FOR WHEEZE OR SHORTNESS OF BREATH   Alcohol Swabs (B-D SINGLE USE SWABS REGULAR) PADS CHECK BLOOD SUGAR TWICE DAILY Dx E11.9   ALPRAZolam (XANAX) 0.25 MG tablet Take 1 tablet (0.25 mg total) by mouth  daily as needed for anxiety.   amLODipine (NORVASC) 5 MG tablet TAKE 1 TABLET EVERY DAY   apixaban (ELIQUIS) 5 MG TABS tablet Take 1 tablet (5 mg total) by mouth 2 (two) times daily.   Blood Glucose Monitoring Suppl (ACCU-CHEK AVIVA PLUS) w/Device KIT USE AS DIRECTED   Blood Glucose Monitoring Suppl (ACCU-CHEK GUIDE ME) w/Device KIT 1 kit by Does not apply route 2 (two) times daily.   CINNAMON PO Take 1 capsule by mouth daily.    cloNIDine (CATAPRES) 0.2 MG tablet Take 1 tablet (0.2 mg total) by mouth 2 (two) times daily.   clotrimazole-betamethasone (LOTRISONE) cream APPLY TO AFFECTED AREA TWICE A DAY   furosemide (LASIX) 20 MG tablet TAKE 1 TABLET EVERY DAY   Ginger, Zingiber officinalis, (GINGER ROOT PO) Take 1 capsule by mouth daily.    glipiZIDE (GLUCOTROL) 5 MG tablet Take 1 tablet (5 mg total) by mouth 2 (two) times daily.   glucose blood (ACCU-CHEK AVIVA PLUS) test strip TEST BLOOD SUGAR TWICE DAILY Dx E11.9   metFORMIN (GLUCOPHAGE) 1000 MG tablet Take 1 tablet (1,000 mg total) by mouth 2 (two) times daily with a meal.   metoprolol tartrate (LOPRESSOR) 50 MG tablet Take 1 tablet (50 mg total) by mouth 2 (two) times daily.   nystatin cream (MYCOSTATIN) Apply 1 application topically 2 (two) times daily.   quinapril (ACCUPRIL) 40 MG tablet Take 1 tablet (  40 mg total) by mouth daily.   simvastatin (ZOCOR) 40 MG tablet TAKE 1 TABLET EVERY EVENING   Turmeric Curcumin 500 MG CAPS Take 500 mg by mouth daily.    [DISCONTINUED] meloxicam (MOBIC) 15 MG tablet TAKE 1 TABLET BY MOUTH EVERY DAY   No facility-administered encounter medications on file as of 11/30/2020.    Past Surgical History:  Procedure Laterality Date   CYST EXCISION Left 03/15/2017   from buttocks   ORIF ANKLE FRACTURE Left 04/20/2017   Procedure: OPEN REDUCTION INTERNAL FIXATION (ORIF) LEFT ANKLE FRACTURE;  Surgeon: Netta Cedars, MD;  Location: [facility];  Service: Orthopedics;  Laterality: Left;    Family History  Problem  Relation Age of Onset   Diabetes Mother    CAD Mother        CABG at age 14   Hypertension Mother    Hyperlipidemia Mother    Diabetes Father    Heart disease Father        CHF   Hypertension Father    Diabetes Sister    CAD Sister    Hypertension Sister    Diabetes Brother    Hypertension Brother    Healthy Sister     New complaints: None today  Social history: Lives by herself. Her daughter checks on her frequently  Controlled substance contract: 05/28/20     Review of Systems  Constitutional:  Negative for diaphoresis.  Eyes:  Negative for pain.  Respiratory:  Negative for shortness of breath.   Cardiovascular:  Negative for chest pain, palpitations and leg swelling.  Gastrointestinal:  Negative for abdominal pain.  Endocrine: Negative for polydipsia.  Skin:  Negative for rash.  Neurological:  Negative for dizziness, weakness and headaches.  Hematological:  Does not bruise/bleed easily.  All other systems reviewed and are negative.     Objective:   Physical Exam Vitals and nursing note reviewed.  Constitutional:      General: She is not in acute distress.    Appearance: Normal appearance. She is well-developed.  HENT:     Head: Normocephalic.     Right Ear: Tympanic membrane normal.     Left Ear: Tympanic membrane normal.     Nose: Nose normal.     Mouth/Throat:     Mouth: Mucous membranes are moist.  Eyes:     Pupils: Pupils are equal, round, and reactive to light.  Neck:     Vascular: No carotid bruit or JVD.  Cardiovascular:     Rate and Rhythm: Normal rate and regular rhythm.     Heart sounds: Normal heart sounds.  Pulmonary:     Effort: Pulmonary effort is normal. No respiratory distress.     Breath sounds: Normal breath sounds. No wheezing or rales.  Chest:     Chest wall: No tenderness.  Abdominal:     General: Bowel sounds are normal. There is no distension or abdominal bruit.     Palpations: Abdomen is soft. There is no hepatomegaly,  splenomegaly, mass or pulsatile mass.     Tenderness: There is no abdominal tenderness.     Comments: pendulous abdomen  Musculoskeletal:        General: Normal range of motion.     Cervical back: Normal range of motion and neck supple.  Lymphadenopathy:     Cervical: No cervical adenopathy.  Skin:    General: Skin is warm and dry.  Neurological:     Mental Status: She is alert and oriented to person, place, and  time.     Deep Tendon Reflexes: Reflexes are normal and symmetric.  Psychiatric:        Behavior: Behavior normal.        Thought Content: Thought content normal.        Judgment: Judgment normal.   BP 133/73   Pulse 69   Temp 98.3 F (36.8 C) (Temporal)   Resp 20   Ht '5\' 2"'  (1.575 m)   Wt 262 lb (118.8 kg)   SpO2 97%   BMI 47.92 kg/m    Hgba1c 6.9%     Assessment & Plan:   Ashley [name] comes in today with chief complaint of Medical Management of Chronic Issues   Diagnosis and orders addressed:  1. Hypertension associated with diabetes ([place]) Low sodium diet - CBC with Differential/Platelet - CMP14+EGFR - amLODipine (NORVASC) 5 MG tablet; Take 1 tablet (5 mg total) by mouth daily.  Dispense: 90 tablet; Refill: 1 - quinapril (ACCUPRIL) 40 MG tablet; Take 1 tablet (40 mg total) by mouth daily.  Dispense: 90 tablet; Refill: 1 - cloNIDine (CATAPRES) 0.2 MG tablet; Take 1 tablet (0.2 mg total) by mouth 2 (two) times daily.  Dispense: 180 tablet; Refill: 1  2. Mixed hyperlipidemia Ow fat diet - Lipid panel - simvastatin (ZOCOR) 40 MG tablet; TAKE 1 TABLET EVERY EVENING  Dispense: 90 tablet; Refill: 1  3. Type 2 diabetes mellitus without complication, without long-term current use of insulin (HCC) Continue towatch carbs in diet - Bayer DCA Hb A1c Waived - glipiZIDE (GLUCOTROL) 5 MG tablet; Take 1 tablet (5 mg total) by mouth 2 (two) times daily.  Dispense: 180 tablet; Refill: 1 - metFORMIN (GLUCOPHAGE) 1000 MG tablet; Take 1 tablet (1,000 mg total) by mouth 2  (two) times daily with a meal.  Dispense: 180 tablet; Refill: 1  4. Persistent atrial fibrillation (HCC) Avoid caffeine - metoprolol tartrate (LOPRESSOR) 50 MG tablet; Take 1 tablet (50 mg total) by mouth 2 (two) times daily.  Dispense: 180 tablet; Refill: 1 - apixaban (ELIQUIS) 5 MG TABS tablet; Take 1 tablet (5 mg total) by mouth 2 (two) times daily.  Dispense: 180 tablet; Refill: 1  5. Peripheral edema Elevate legs when sitting - furosemide (LASIX) 20 MG tablet; Take 1 tablet (20 mg total) by mouth daily.  Dispense: 90 tablet; Refill: 1  6. Severe obesity (BMI >= 40) (HCC) Discussed diet and exercise for person with BMI >25 Will recheck weight in 3-6 months    Labs pending Health Maintenance reviewed Diet and exercise encouraged  Follow up plan: 3 months   Mary-Margaret Hassell Done, FNP

## 2020-11-30 NOTE — Addendum Note (Signed)
Addended by: Chevis Pretty on: 11/30/2020 12:04 PM   Modules accepted: Orders

## 2021-01-14 ENCOUNTER — Other Ambulatory Visit: Payer: Self-pay | Admitting: Nurse Practitioner

## 2021-02-02 ENCOUNTER — Ambulatory Visit (INDEPENDENT_AMBULATORY_CARE_PROVIDER_SITE_OTHER): Payer: Medicare HMO | Admitting: Nurse Practitioner

## 2021-02-02 ENCOUNTER — Encounter: Payer: Self-pay | Admitting: Nurse Practitioner

## 2021-02-02 DIAGNOSIS — J029 Acute pharyngitis, unspecified: Secondary | ICD-10-CM

## 2021-02-02 DIAGNOSIS — J069 Acute upper respiratory infection, unspecified: Secondary | ICD-10-CM | POA: Diagnosis not present

## 2021-02-02 MED ORDER — AMOXICILLIN 875 MG PO TABS: 875.0000 mg | ORAL_TABLET | Freq: Two times a day (BID) | ORAL | 0 refills | Status: DC

## 2021-02-02 NOTE — Progress Notes (Signed)
Virtual Visit  Note Due to COVID-19 pandemic this visit was conducted virtually. This visit type was conducted due to national recommendations for restrictions regarding the COVID-19 Pandemic (e.g. social distancing, sheltering in place) in an effort to limit this patient's exposure and mitigate transmission in our community. All issues noted in this document were discussed and addressed.  A physical exam was not performed with this format.  I connected with Ashley [name] on 02/02/21 at 10:15 by telephone and verified that I am speaking with the correct person using two identifiers. Ashley [name] is currently located at home and no one is currently with her during visit. The provider, Mary-Margaret Hassell Done, FNP is located in their office at time of visit.  I discussed the limitations, risks, security and privacy concerns of performing an evaluation and management service by telephone and the availability of in person appointments. I also discussed with the patient that there may be a patient responsible charge related to this service. The patient expressed understanding and agreed to proceed.   History and Present Illness:  Patient c/o sore throat with white patches on right side. This started christmas eve. Has had some conestion and cough. No fever that she is aware of .      Review of Systems  Constitutional:  Positive for malaise/fatigue. Negative for chills.  HENT:  Positive for congestion and sore throat.   Respiratory:  Positive for cough and sputum production (at times).   Musculoskeletal:  Negative for myalgias.  Neurological:  Negative for headaches.    Observations/Objective: Alert and oriented- answers all questions appropriately No distress Ras[place] voice Deep wet cough   Assessment and Plan: Ashley [name] in today with chief complaint of No chief complaint on file.   1. URI with cough and congestion  2. Pharyngitis, unspecified etiology 1. Take meds as prescribed 2. Use  a cool mist humidifier especially during the winter months and when heat has been humid. 3. Use saline nose sprays frequently 4. Saline irrigations of the nose can be very helpful if done frequently.  * 4X daily for 1 week*  * Use of a nettie pot can be helpful with this. Follow directions with this* 5. Drink plenty of fluids 6. Keep thermostat turn down low 7.For any cough or congestion- mucinex OTC 8. For fever or aces or pains- take tylenol or ibuprofen appropriate for age and weight.  * for fevers greater than 101 orally you may alternate ibuprofen and tylenol every  3 hours.    - amoxicillin (AMOXIL) 875 MG tablet; Take 1 tablet (875 mg total) by mouth 2 (two) times daily. 1 po BID  Dispense: 20 tablet; Refill: 0     Follow Up Instructions: pn    I discussed the assessment and treatment plan with the patient. The patient was provided an opportunity to ask questions and all were answered. The patient agreed with the plan and demonstrated an understanding of the instructions.   The patient was advised to call back or seek an in-person evaluation if the symptoms worsen or if the condition fails to improve as anticipated.  The above assessment and management plan was discussed with the patient. The patient verbalized understanding of and has agreed to the management plan. Patient is aware to call the clinic if symptoms persist or worsen. Patient is aware when to return to the clinic for a follow-up visit. Patient educated on when it is appropriate to go to the emergency department.   Time call ended:  10:28  I provided 13 minutes of  non face-to-face time during this encounter.    Mary-Margaret Hassell Done, FNP

## 2021-02-02 NOTE — Patient Instructions (Signed)

## 2021-02-04 DIAGNOSIS — Z1231 Encounter for screening mammogram for malignant neoplasm of breast: Secondary | ICD-10-CM | POA: Diagnosis not present

## 2021-02-12 ENCOUNTER — Telehealth: Payer: Self-pay | Admitting: Nurse Practitioner

## 2021-02-12 NOTE — Telephone Encounter (Signed)
lmtcb

## 2021-02-12 NOTE — Telephone Encounter (Signed)
Pt aware to try delsym

## 2021-03-03 ENCOUNTER — Encounter: Payer: Self-pay | Admitting: Nurse Practitioner

## 2021-03-03 ENCOUNTER — Ambulatory Visit (INDEPENDENT_AMBULATORY_CARE_PROVIDER_SITE_OTHER): Payer: Medicare HMO | Admitting: Nurse Practitioner

## 2021-03-03 VITALS — BP 139/65 | HR 54 | Temp 98.5°F | Resp 20 | Ht 62.0 in | Wt 261.0 lb

## 2021-03-03 DIAGNOSIS — E119 Type 2 diabetes mellitus without complications: Secondary | ICD-10-CM

## 2021-03-03 DIAGNOSIS — E782 Mixed hyperlipidemia: Secondary | ICD-10-CM | POA: Diagnosis not present

## 2021-03-03 DIAGNOSIS — I152 Hypertension secondary to endocrine disorders: Secondary | ICD-10-CM | POA: Diagnosis not present

## 2021-03-03 DIAGNOSIS — F411 Generalized anxiety disorder: Secondary | ICD-10-CM

## 2021-03-03 DIAGNOSIS — I4819 Other persistent atrial fibrillation: Secondary | ICD-10-CM

## 2021-03-03 DIAGNOSIS — E1159 Type 2 diabetes mellitus with other circulatory complications: Secondary | ICD-10-CM

## 2021-03-03 DIAGNOSIS — R609 Edema, unspecified: Secondary | ICD-10-CM | POA: Diagnosis not present

## 2021-03-03 LAB — BAYER DCA HB A1C WAIVED: HB A1C (BAYER DCA - WAIVED): 6.6 % — ABNORMAL HIGH (ref 4.8–5.6)

## 2021-03-03 MED ORDER — CLONIDINE HCL 0.2 MG PO TABS: 0.2000 mg | ORAL_TABLET | Freq: Two times a day (BID) | ORAL | 1 refills | Status: DC

## 2021-03-03 MED ORDER — METFORMIN HCL 1000 MG PO TABS: 1000.0000 mg | ORAL_TABLET | Freq: Two times a day (BID) | ORAL | 1 refills | Status: DC

## 2021-03-03 MED ORDER — SIMVASTATIN 40 MG PO TABS: ORAL_TABLET | ORAL | 1 refills | Status: DC

## 2021-03-03 MED ORDER — METOPROLOL TARTRATE 50 MG PO TABS: 50.0000 mg | ORAL_TABLET | Freq: Two times a day (BID) | ORAL | 1 refills | Status: DC

## 2021-03-03 MED ORDER — QUINAPRIL HCL 40 MG PO TABS: 40.0000 mg | ORAL_TABLET | Freq: Every day | ORAL | 1 refills | Status: DC

## 2021-03-03 MED ORDER — FUROSEMIDE 20 MG PO TABS: 20.0000 mg | ORAL_TABLET | Freq: Every day | ORAL | 1 refills | Status: DC

## 2021-03-03 MED ORDER — APIXABAN 5 MG PO TABS: 5.0000 mg | ORAL_TABLET | Freq: Two times a day (BID) | ORAL | 1 refills | Status: DC

## 2021-03-03 MED ORDER — ALPRAZOLAM 0.25 MG PO TABS: 0.2500 mg | ORAL_TABLET | Freq: Every day | ORAL | 2 refills | Status: DC | PRN

## 2021-03-03 MED ORDER — AMLODIPINE BESYLATE 5 MG PO TABS: 5.0000 mg | ORAL_TABLET | Freq: Every day | ORAL | 1 refills | Status: DC

## 2021-03-03 MED ORDER — GLIPIZIDE 5 MG PO TABS: 5.0000 mg | ORAL_TABLET | Freq: Two times a day (BID) | ORAL | 1 refills | Status: DC

## 2021-03-03 NOTE — Patient Instructions (Signed)

## 2021-03-03 NOTE — Progress Notes (Signed)
Subjective:    Patient ID: Ashley [name], female    DOB: 1950/12/04, 71 y.o.   MRN: [id number]  Chief Complaint: Medical Management of Chronic Issues    HPI:  Ashley [name] is a 71 y.o. who identifies as a female who was assigned female at birth.   Social history: Lives with: by herself- her daughter checks on her daily Work history: drives school bus   Comes in today for follow up of the following chronic medical issues:  1. Mixed hyperlipidemia Does not watch diet and does little to no exercise. Lab Results  Component Value Date   CHOL 134 11/30/2020   HDL 54 11/30/2020   LDLCALC 56 11/30/2020   TRIG 138 11/30/2020   CHOLHDL 2.5 11/30/2020   The 10-year ASCVD risk score (Arnett DK, et al., 2019) is: 19%   2. Type 2 diabetes mellitus without complication, without long-term current use of insulin (HCC) She does not check her blood sugars at home. No symptoms of low blood sugar. Lab Results  Component Value Date   HGBA1C 6.9 (H) 11/30/2020     3. Hypertension associated with diabetes ([place]) No c/o chest pain, sob or headache does not check blood pressure at home. BP Readings from Last 3 Encounters:  03/03/21 139/65  11/30/20 133/73  11/24/20 (!) 150/84     4. Persistent atrial fibrillation (HCC) No c/o palpitations or heart racing. She is on eliquis with no issues.  5. Peripheral edema Has some swelling by the end of the day. Resolves some at night  6. Severe obesity (BMI >= 40) (HCC) No recent weight changes Wt Readings from Last 3 Encounters:  03/03/21 261 lb (118.4 kg)  11/30/20 262 lb (118.8 kg)  11/24/20 261 lb (118.4 kg)   BMI Readings from Last 3 Encounters:  03/03/21 47.74 kg/m  11/30/20 47.92 kg/m  11/24/20 47.74 kg/m   7. anxiety GAD 7 : Generalized Anxiety Score 03/03/2021 11/30/2020 08/25/2020 11/02/2018  Nervous, Anxious, on Edge 2 1 0 0  Control/stop worrying 2 0 0 1  Worry too much - different things 2 1 0 0  Trouble relaxing 0 1 0 0   Restless 0 0 0 0  Easily annoyed or irritable 0 0 0 0  Afraid - awful might happen 2 0 0 0  Total GAD 7 Score 8 3 0 1  Anxiety Difficulty Not difficult at all Not difficult at all Not difficult at all -       New complaints: None today  Allergies  Allergen Reactions   Ciprofloxacin Hcl Hives   Outpatient Encounter Medications as of 03/03/2021  Medication Sig   Accu-Chek Softclix Lancets lancets TEST BLOOD SUGAR TWICE DAILY Dx E11.9   acetaminophen (TYLENOL) 500 MG tablet Take 1,000 mg by mouth every 6 (six) hours as needed (for pain/headaches.).   albuterol (VENTOLIN HFA) 108 (90 Base) MCG/ACT inhaler INHALE 2 PUFFS INTO THE LUNGS EVERY 6 HOURS AS NEEDED FOR WHEEZE OR SHORTNESS OF BREATH   Alcohol Swabs (DROPSAFE ALCOHOL PREP) 70 % PADS CHECK BLOOD SUGAR TWICE DAILY   ALPRAZolam (XANAX) 0.25 MG tablet Take 1 tablet (0.25 mg total) by mouth daily as needed for anxiety.   amLODipine (NORVASC) 5 MG tablet Take 1 tablet (5 mg total) by mouth daily.   apixaban (ELIQUIS) 5 MG TABS tablet Take 1 tablet (5 mg total) by mouth 2 (two) times daily.   Blood Glucose Monitoring Suppl (ACCU-CHEK AVIVA PLUS) w/Device KIT USE AS DIRECTED   Blood Glucose  Monitoring Suppl (ACCU-CHEK GUIDE ME) w/Device KIT 1 kit by Does not apply route 2 (two) times daily.   CINNAMON PO Take 1 capsule by mouth daily.    cloNIDine (CATAPRES) 0.2 MG tablet Take 1 tablet (0.2 mg total) by mouth 2 (two) times daily.   clotrimazole-betamethasone (LOTRISONE) cream APPLY TO AFFECTED AREA TWICE A DAY   furosemide (LASIX) 20 MG tablet Take 1 tablet (20 mg total) by mouth daily.   Ginger, Zingiber officinalis, (GINGER ROOT PO) Take 1 capsule by mouth daily.    glipiZIDE (GLUCOTROL) 5 MG tablet Take 1 tablet (5 mg total) by mouth 2 (two) times daily.   glucose blood (ACCU-CHEK AVIVA PLUS) test strip TEST BLOOD SUGAR TWICE DAILY Dx E11.9   metFORMIN (GLUCOPHAGE) 1000 MG tablet Take 1 tablet (1,000 mg total) by mouth 2 (two)  times daily with a meal.   metoprolol tartrate (LOPRESSOR) 50 MG tablet Take 1 tablet (50 mg total) by mouth 2 (two) times daily.   nystatin cream (MYCOSTATIN) Apply 1 application topically 2 (two) times daily.   quinapril (ACCUPRIL) 40 MG tablet Take 1 tablet (40 mg total) by mouth daily.   simvastatin (ZOCOR) 40 MG tablet TAKE 1 TABLET EVERY EVENING   Turmeric Curcumin 500 MG CAPS Take 500 mg by mouth daily.    [DISCONTINUED] amoxicillin (AMOXIL) 875 MG tablet Take 1 tablet (875 mg total) by mouth 2 (two) times daily. 1 po BID   No facility-administered encounter medications on file as of 03/03/2021.    Past Surgical History:  Procedure Laterality Date   CYST EXCISION Left 03/15/2017   from buttocks   ORIF ANKLE FRACTURE Left 04/20/2017   Procedure: OPEN REDUCTION INTERNAL FIXATION (ORIF) LEFT ANKLE FRACTURE;  Surgeon: Netta Cedars, MD;  Location: [facility];  Service: Orthopedics;  Laterality: Left;    Family History  Problem Relation Age of Onset   Diabetes Mother    CAD Mother        CABG at age 73   Hypertension Mother    Hyperlipidemia Mother    Diabetes Father    Heart disease Father        CHF   Hypertension Father    Diabetes Sister    CAD Sister    Hypertension Sister    Diabetes Brother    Hypertension Brother    Healthy Sister       Controlled substance contract: n/a     Review of Systems  Constitutional:  Negative for diaphoresis.  Eyes:  Negative for pain.  Respiratory:  Negative for shortness of breath.   Cardiovascular:  Negative for chest pain, palpitations and leg swelling.  Gastrointestinal:  Negative for abdominal pain.  Endocrine: Negative for polydipsia.  Skin:  Negative for rash.  Neurological:  Negative for dizziness, weakness and headaches.  Hematological:  Does not bruise/bleed easily.  All other systems reviewed and are negative.     Objective:   Physical Exam Vitals and nursing note reviewed.  Constitutional:      General: She is  not in acute distress.    Appearance: Normal appearance. She is well-developed.  HENT:     Head: Normocephalic.     Right Ear: Tympanic membrane normal.     Left Ear: Tympanic membrane normal.     Nose: Nose normal.     Mouth/Throat:     Mouth: Mucous membranes are moist.  Eyes:     Pupils: Pupils are equal, round, and reactive to light.  Neck:  Vascular: No carotid bruit or JVD.  Cardiovascular:     Rate and Rhythm: Normal rate and regular rhythm.     Heart sounds: Normal heart sounds.  Pulmonary:     Effort: Pulmonary effort is normal. No respiratory distress.     Breath sounds: Normal breath sounds. No wheezing or rales.  Chest:     Chest wall: No tenderness.  Abdominal:     General: Bowel sounds are normal. There is no distension or abdominal bruit.     Palpations: Abdomen is soft. There is no hepatomegaly, splenomegaly, mass or pulsatile mass.     Tenderness: There is no abdominal tenderness.     Comments: large pendulous abdomen  Musculoskeletal:        General: Normal range of motion.     Cervical back: Normal range of motion and neck supple.  Lymphadenopathy:     Cervical: No cervical adenopathy.  Skin:    General: Skin is warm and dry.     Comments: 3cm irregular shaped flesh colored scaley lesion on right mid back  Neurological:     Mental Status: She is alert and oriented to person, place, and time.     Deep Tendon Reflexes: Reflexes are normal and symmetric.  Psychiatric:        Behavior: Behavior normal.        Thought Content: Thought content normal.        Judgment: Judgment normal.    BP 139/65    Pulse (!) 54    Temp 98.5 F (36.9 C) (Temporal)    Resp 20    Ht _0  (1.575 m)    Wt 261 lb (118.4 kg)    SpO2 98%    BMI 47.74 kg/m   HGBA1c 6.6%     Assessment & Plan:   Ashley [name] comes in today with chief complaint of Medical Management of Chronic Issues   Diagnosis and orders addressed:  1. Mixed hyperlipidemia Low fat diet - Lipid  panel - simvastatin (ZOCOR) 40 MG tablet; TAKE 1 TABLET EVERY EVENING  Dispense: 90 tablet; Refill: 1  2. Type 2 diabetes mellitus without complication, without long-term current use of insulin (HCC) Continue to watch carbs in diet - Bayer DCA Hb A1c Waived - glipiZIDE (GLUCOTROL) 5 MG tablet; Take 1 tablet (5 mg total) by mouth 2 (two) times daily.  Dispense: 180 tablet; Refill: 1 - metFORMIN (GLUCOPHAGE) 1000 MG tablet; Take 1 tablet (1,000 mg total) by mouth 2 (two) times daily with a meal.  Dispense: 180 tablet; Refill: 1  3. Hypertension associated with diabetes ([place]) Low sodium diet - CBC with Differential/Platelet - CMP14+EGFR - amLODipine (NORVASC) 5 MG tablet; Take 1 tablet (5 mg total) by mouth daily.  Dispense: 90 tablet; Refill: 1 - quinapril (ACCUPRIL) 40 MG tablet; Take 1 tablet (40 mg total) by mouth daily.  Dispense: 90 tablet; Refill: 1 - cloNIDine (CATAPRES) 0.2 MG tablet; Take 1 tablet (0.2 mg total) by mouth 2 (two) times daily.  Dispense: 180 tablet; Refill: 1  4. Persistent atrial fibrillation (HCC) Avoid caffeine - metoprolol tartrate (LOPRESSOR) 50 MG tablet; Take 1 tablet (50 mg total) by mouth 2 (two) times daily.  Dispense: 180 tablet; Refill: 1 - apixaban (ELIQUIS) 5 MG TABS tablet; Take 1 tablet (5 mg total) by mouth 2 (two) times daily.  Dispense: 180 tablet; Refill: 1  5. Peripheral edema Elevate  legs when sitting - furosemide (LASIX) 20 MG tablet; Take 1 tablet (20 mg total) by  mouth daily.  Dispense: 90 tablet; Refill: 1  6. Severe obesity (BMI >= 40) (HCC) Discussed diet and exercise for person with BMI >25 Will recheck weight in 3-6 months   7. GAD (generalized anxiety disorder) Stress management - ALPRAZolam (XANAX) 0.25 MG tablet; Take 1 tablet (0.25 mg total) by mouth daily as needed for anxiety.  Dispense: 30 tablet; Refill: 2   Labs pending Health Maintenance reviewed Diet and exercise encouraged  Follow up plan: 6  months   Mary-Margaret Hassell Done, FNP

## 2021-03-04 LAB — CMP14+EGFR
ALT: 17 IU/L (ref 0–32)
AST: 26 IU/L (ref 0–40)
Albumin/Globulin Ratio: 1.5 (ref 1.2–2.2)
Albumin: 4.4 g/dL (ref 3.8–4.8)
Alkaline Phosphatase: 60 IU/L (ref 44–121)
BUN/Creatinine Ratio: 26 (ref 12–28)
BUN: 16 mg/dL (ref 8–27)
Bilirubin Total: 0.7 mg/dL (ref 0.0–1.2)
CO2: 25 mmol/L (ref 20–29)
Calcium: 9.8 mg/dL (ref 8.7–10.3)
Chloride: 102 mmol/L (ref 96–106)
Creatinine, Ser: 0.62 mg/dL (ref 0.57–1.00)
Globulin, Total: 2.9 g/dL (ref 1.5–4.5)
Glucose: 132 mg/dL — ABNORMAL HIGH (ref 70–99)
Potassium: 4.6 mmol/L (ref 3.5–5.2)
Sodium: 144 mmol/L (ref 134–144)
Total Protein: 7.3 g/dL (ref 6.0–8.5)
eGFR: 97 mL/min/{1.73_m2} (ref >59)

## 2021-03-04 LAB — LIPID PANEL
Chol/HDL Ratio: 2.7 ratio (ref 0.0–4.4)
Cholesterol, Total: 135 mg/dL (ref 100–199)
HDL: 50 mg/dL (ref >39)
LDL Chol Calc (NIH): 67 mg/dL (ref 0–99)
Triglycerides: 95 mg/dL (ref 0–149)
VLDL Cholesterol Cal: 18 mg/dL (ref 5–40)

## 2021-03-04 LAB — CBC WITH DIFFERENTIAL/PLATELET
Basophils Absolute: 0.1 10*3/uL (ref 0.0–0.2)
Basos: 1 %
EOS (ABSOLUTE): 0.1 10*3/uL (ref 0.0–0.4)
Eos: 2 %
Hematocrit: 41.5 % (ref 34.0–46.6)
Hemoglobin: 13.8 g/dL (ref 11.1–15.9)
Immature Grans (Abs): 0 10*3/uL (ref 0.0–0.1)
Immature Granulocytes: 1 %
Lymphocytes Absolute: 1.5 10*3/uL (ref 0.7–3.1)
Lymphs: 24 %
MCH: 31.2 pg (ref 26.6–33.0)
MCHC: 33.3 g/dL (ref 31.5–35.7)
MCV: 94 fL (ref 79–97)
Monocytes Absolute: 0.6 10*3/uL (ref 0.1–0.9)
Monocytes: 9 %
Neutrophils Absolute: 4.1 10*3/uL (ref 1.4–7.0)
Neutrophils: 63 %
Platelets: 226 10*3/uL (ref 150–450)
RBC: 4.43 x10E6/uL (ref 3.77–5.28)
RDW: 13.1 % (ref 11.7–15.4)
WBC: 6.5 10*3/uL (ref 3.4–10.8)

## 2021-05-18 ENCOUNTER — Encounter: Payer: Self-pay | Admitting: Nurse Practitioner

## 2021-05-18 ENCOUNTER — Ambulatory Visit (INDEPENDENT_AMBULATORY_CARE_PROVIDER_SITE_OTHER): Payer: Medicare HMO | Admitting: Nurse Practitioner

## 2021-05-18 ENCOUNTER — Other Ambulatory Visit: Payer: Self-pay | Admitting: Nurse Practitioner

## 2021-05-18 VITALS — BP 158/77 | HR 64 | Temp 98.2°F | Resp 20 | Ht 62.0 in | Wt 263.0 lb

## 2021-05-18 DIAGNOSIS — M542 Cervicalgia: Secondary | ICD-10-CM

## 2021-05-18 DIAGNOSIS — M549 Dorsalgia, unspecified: Secondary | ICD-10-CM

## 2021-05-18 MED ORDER — DICLOFENAC SODIUM 1 % EX GEL: 4.0000 g | Freq: Four times a day (QID) | CUTANEOUS | 0 refills | Status: DC

## 2021-05-18 NOTE — Progress Notes (Signed)
? ?  Subjective:  ? ? Patient ID: Ashley [name], female    DOB: 26-Dec-1950, 71 y.o.   MRN: [id number] ? ? ?Chief Complaint: left flank pain ? ? ?HPI ?Patient comes in today c/o left flank pain. Pain started yesterday and the pain is also in her left side of neck but does not radiate down her arm. Rates pain 8/10  at times. Nothing make sit better or worse. Pain is intermittent.  She denies chest pain. Denies having any  chest congestion and has been coughing. Has indigestion mainly at night. ? ? ? ?Review of Systems  ?Constitutional:  Negative for diaphoresis.  ?Eyes:  Negative for pain.  ?Respiratory:  Positive for cough and chest tightness (kinda). Negative for choking, shortness of breath, wheezing and stridor.   ?Cardiovascular:  Negative for chest pain, palpitations and leg swelling.  ?Gastrointestinal:  Negative for abdominal pain.  ?Endocrine: Negative for polydipsia.  ?Skin:  Negative for rash.  ?Neurological:  Negative for dizziness, weakness and headaches.  ?Hematological:  Does not bruise/bleed easily.  ?All other systems reviewed and are negative. ? ?   ?Objective:  ? Physical Exam ?Vitals reviewed.  ?Constitutional:   ?   Appearance: Normal appearance. She is obese.  ?Cardiovascular:  ?   Rate and Rhythm: Normal rate and regular rhythm.  ?   Heart sounds: Normal heart sounds.  ?Pulmonary:  ?   Effort: Pulmonary effort is normal.  ?   Breath sounds: Normal breath sounds.  ?Abdominal:  ?   Tenderness: There is no right CVA tenderness or left CVA tenderness.  ?Musculoskeletal:     ?   General: Normal range of motion.  ?   Comments: S pain on left side of neck when she leans head to the right.  ?Skin: ?   General: Skin is warm.  ?Neurological:  ?   General: No focal deficit present.  ?   Mental Status: She is alert and oriented to person, place, and time.  ?Psychiatric:     ?   Mood and Affect: Mood normal.     ?   Behavior: Behavior normal.  ? ? ?BP (!) 158/77   Pulse 64   Temp 98.2 ?F (36.8 ?C) (Temporal)    Resp 20   Ht '5\' 2"'$  (1.575 m)   Wt 263 lb (119.3 kg)   SpO2 97%   BMI 48.10 kg/m?  ? ?Ekg- atrial fib with rate  Ashley Harper, FNP ?  ?  ? ? ?   ?Assessment & Plan:  ?Ashley [name] in today with chief complaint of left flank pain ? ? ?1. Other acute back pain ?- EKG 12-Lead ?- EKG 12-Lead ? ?2. Neck pain ?Moist heat ?Rest ?Voltaren gel topical ?RTO prn ? ? ? ?The above assessment and management plan was discussed with the patient. The patient verbalized understanding of and has agreed to the management plan. Patient is aware to call the clinic if symptoms persist or worsen. Patient is aware when to return to the clinic for a follow-up visit. Patient educated on when it is appropriate to go to the emergency department.  ? ?Mary-Margaret Hassell Done, FNP ? ? ? ?

## 2021-06-03 ENCOUNTER — Ambulatory Visit (INDEPENDENT_AMBULATORY_CARE_PROVIDER_SITE_OTHER): Payer: Medicare HMO | Admitting: Nurse Practitioner

## 2021-06-03 ENCOUNTER — Encounter: Payer: Self-pay | Admitting: Nurse Practitioner

## 2021-06-03 VITALS — BP 144/82 | HR 86 | Temp 98.3°F | Resp 20 | Ht 62.0 in | Wt 266.0 lb

## 2021-06-03 DIAGNOSIS — M7022 Olecranon bursitis, left elbow: Secondary | ICD-10-CM | POA: Diagnosis not present

## 2021-06-03 MED ORDER — PREDNISONE 20 MG PO TABS: 40.0000 mg | ORAL_TABLET | Freq: Every day | ORAL | 0 refills | Status: DC

## 2021-06-03 NOTE — Patient Instructions (Signed)
Elbow Bursitis  Elbow bursitis is the swelling of the fluid-filled sac (bursa) at the tip of the elbow. A bursa is like a cushion that protects the joint. If the bursa becomes irritated, it can fill with extra fluid and become swollen. What are the causes? Injury to the elbow. Leaning the elbow on a hard surface for a long time. Infection. Bone spurs. Certain conditions that cause swelling. Sometimes, the cause is not known. What are the signs or symptoms? The first sign of this condition is often swelling at the tip of the elbow. The swelling can grow to the size of a golf ball. Other symptoms include: Pain when bending or leaning on the elbow. Stiffness of the elbow. If the cause is infection, you may have: Redness, warmth, and tenderness. Pus coming from a cut near the elbow. How is this treated? Treatment depends on the cause. It may include: Medicines. Draining fluid from the bursa. Placing a bandage or pressure (compression) sleeve around the elbow. Wearing elbow pads. Surgery, if other treatments do not help. Follow these instructions at home: Medicines Take over-the-counter and prescription medicines only as told by your doctor. If you were prescribed an antibiotic medicine, take it as told by your doctor. Do not stop taking it even if you start to feel better. Managing pain, stiffness, and swelling     If told, put ice on the elbow. To do this: Put ice in a plastic bag. Place a towel between your skin and the bag. Leave the ice on for 20 minutes, 2-3 times a day. Take off the ice if your skin turns bright red. This is very important. If you cannot feel pain, heat, or cold, you have a greater risk of damage to the area. If told, put heat on the affected area. Do this as often as told by your doctor. Use the heat source that your doctor recommends, such as a moist heat pack or a heating pad. Place a towel between your skin and the heat source. Leave the heat on for 20-30  minutes. Take off the heat if your skin turns bright red. This is very important. If you cannot feel pain, heat, or cold, you have a greater risk of getting burned. If your bursitis is caused by an injury, follow instructions from your doctor about: Resting your elbow. Wearing a bandage or sleeve. Wear elbow pads or elbow wraps as needed. These help cushion your elbow. General instructions Avoid any activities that cause elbow pain. Ask your doctor what activities are safe for you. Keep all follow-up visits. Contact a doctor if: You have a fever. You have problems that do not get better with treatment. You have pain or swelling that: Gets worse. Goes away and then comes back. You have pus draining from your elbow. You have redness around the elbow area. Your elbow feels warm to the touch. Get help right away if: You have trouble moving your arm, hand, or fingers. Summary Elbow bursitis is the swelling of the fluid-filled sac (bursa) at the tip of the elbow. You may need to take medicine or put ice on your elbow. Contact your doctor if your problems do not get better with treatment. Also, contact your doctor if your problems go away and then come back. This information is not intended to replace advice given to you by your health care provider. Make sure you discuss any questions you have with your health care provider. Document Revised: 01/19/2021 Document Reviewed: 01/19/2021 Elsevier Patient Education    [place]. ? ?

## 2021-06-03 NOTE — Progress Notes (Addendum)
? ?  Subjective:  ? ? Patient ID: Sonakshi Rolland, female    DOB: [birth year], 71 y.o.   MRN: [id number] ? ? ?Chief Complaint: Left elbow pain ? ? 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 She said her elbow was hurting and when she looked at it it was swollen. Is much better today. ? ? ? ?Review of Systems  ?Constitutional:  Negative for diaphoresis.  ?Eyes:  Negative for pain.  ?Respiratory:  Negative for shortness of breath.   ?Cardiovascular:  Negative for chest pain, palpitations and leg swelling.  ?Gastrointestinal:  Negative for abdominal pain.  ?Endocrine: Negative for polydipsia.  ?Skin:  Negative for rash.  ?Neurological:  Negative for dizziness, weakness and headaches.  ?Hematological:  Does not bruise/bleed easily.  ?All other systems reviewed and are negative. ? ?   ?Objective:  ? Physical Exam ?Vitals reviewed.  ?Constitutional:   ?   Appearance: Normal appearance.  ?Cardiovascular:  ?   Rate and Rhythm: Normal rate and regular rhythm.  ?   Heart sounds: Normal heart sounds.  ?Pulmonary:  ?   Effort: Pulmonary effort is normal.  ?   Breath sounds: Normal breath sounds.  ?Musculoskeletal:  ?   Comments: Left elbow edematous and tender to touch  ?Skin: ?   General: Skin is warm.  ?Neurological:  ?   General: No focal deficit present.  ?   Mental Status: She is alert and oriented to person, place, and time.  ?Psychiatric:     ?   Mood and Affect: Mood normal.     ?   Behavior: Behavior normal.  ? ? ?BP (!) 144/82   Pulse 86   Temp 98.3 ?F (36.8 ?C) (Temporal)   Resp 20   Ht '5\' 2"'$  (1.575 m)   Wt 266 lb (120.7 kg)   SpO2 97%   BMI 48.65 kg/m?  ? ? ? ?   ?Assessment & Plan:  ? ?[name] in today with chief complaint of Left elbow pain ? ? ?1. Olecranon bursitis of left elbow ?Ice ?Rest ?If not improving let me know ?- predniSONE (DELTASONE) 20 MG tablet; Take 2 tablets (40 mg total) by mouth daily with breakfast for 5 days. 2 po daily for 5 days  Dispense: 10 tablet; Refill:  0 ? ? ? ?The above assessment and management plan was discussed with the patient. The patient verbalized understanding of and has agreed to the management plan. Patient is aware to call the clinic if symptoms persist or worsen. Patient is aware when to return to the clinic for a follow-up visit. Patient educated on when it is appropriate to go to the emergency department.  ? ?Mary-Margaret Hassell Done, FNP ? ? ?

## 2021-06-07 IMAGING — DX DG HIP (WITH OR WITHOUT PELVIS) 2-3V*R*
2 series · 2 of 2 positions shown · non-contrast
Comparison: None.

CLINICAL DATA: RIGHT hip pain

EXAM:
DG HIP (WITH OR WITHOUT PELVIS) 2-3V RIGHT

[pelvis ap]
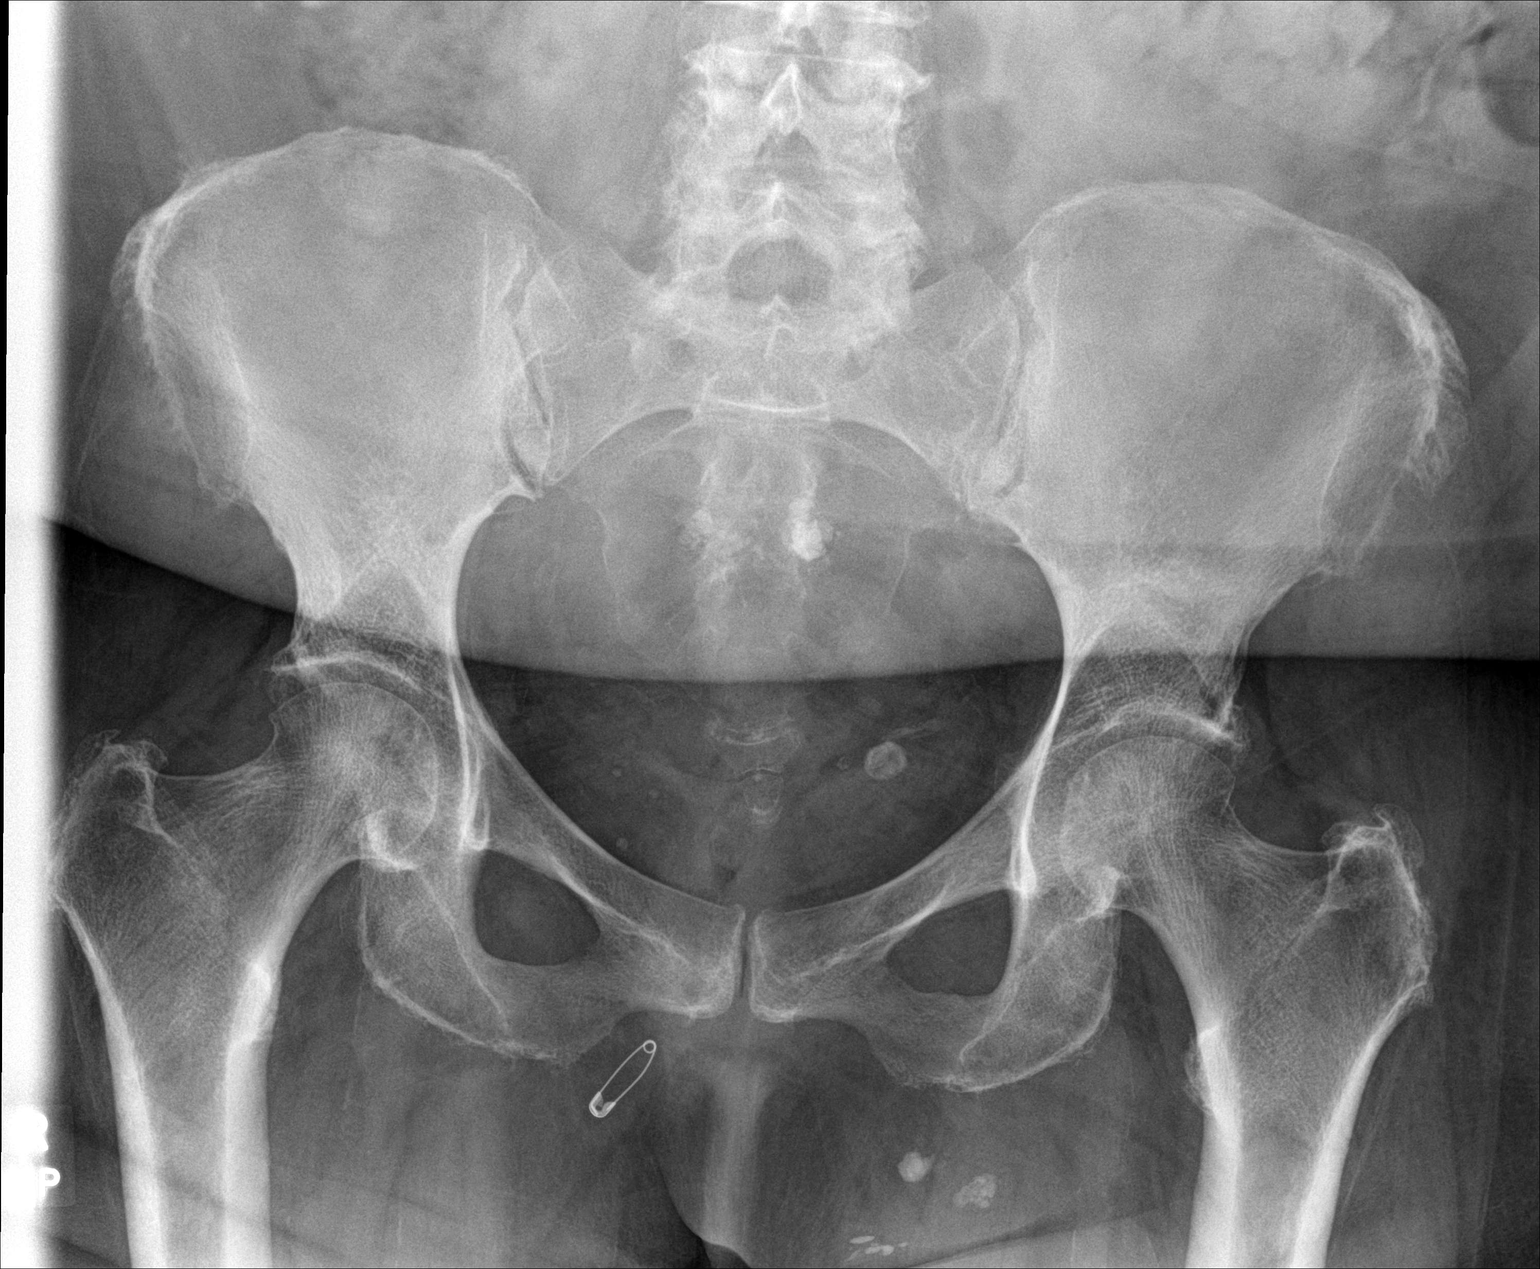

[hip lat]
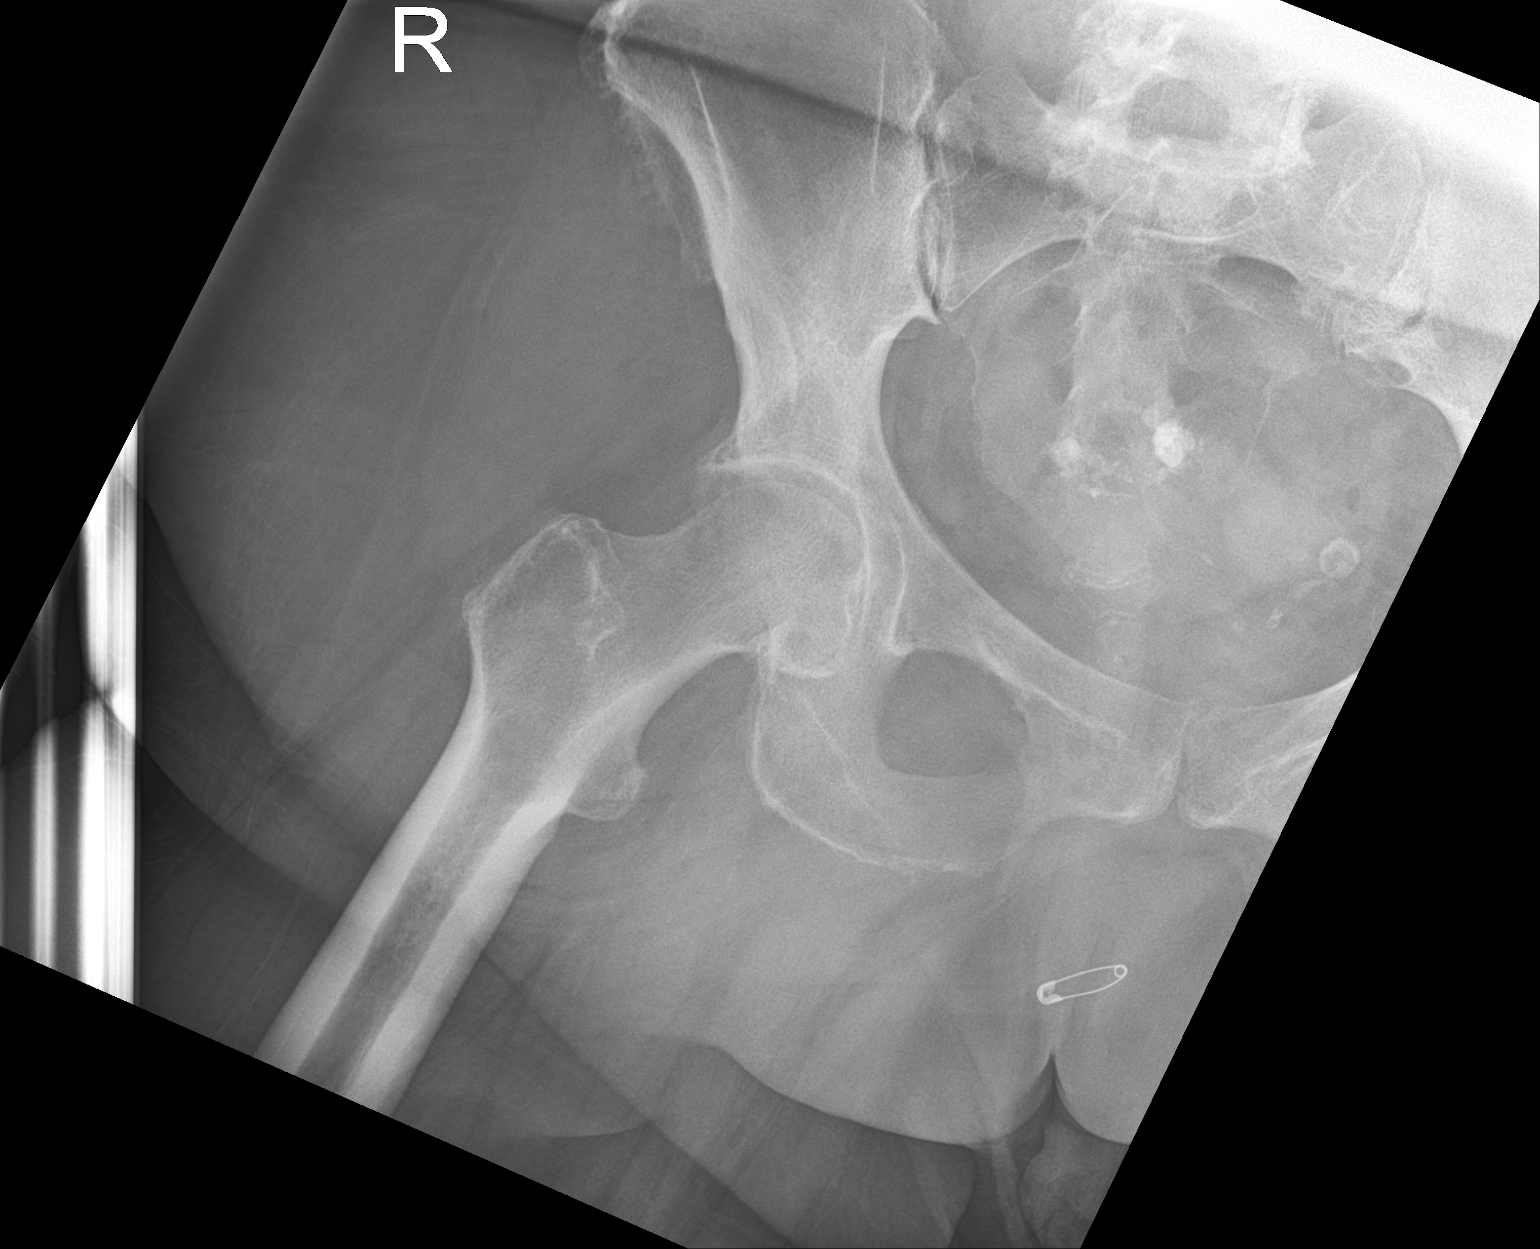

[2 of 2 positions shown; findings below may reference images not displayed]

FINDINGS: Hips are located. No evidence of pelvic fracture or sacral fracture.
Dedicated view of the RIGHT hip demonstrates no femoral neck
fracture. Degenerate sclerosis of the acetabuli and femoral heads.
IMPRESSION: No acute findings of the pelvis or RIGHT hip.  Mild arthropathy.

## 2021-06-08 ENCOUNTER — Ambulatory Visit (INDEPENDENT_AMBULATORY_CARE_PROVIDER_SITE_OTHER): Payer: Medicare HMO | Admitting: Nurse Practitioner

## 2021-06-08 ENCOUNTER — Encounter: Payer: Self-pay | Admitting: Nurse Practitioner

## 2021-06-08 VITALS — BP 167/71 | HR 59 | Temp 98.0°F | Resp 20 | Ht 62.0 in | Wt 268.0 lb

## 2021-06-08 DIAGNOSIS — M7022 Olecranon bursitis, left elbow: Secondary | ICD-10-CM

## 2021-06-08 MED ORDER — SULFAMETHOXAZOLE-TRIMETHOPRIM 800-160 MG PO TABS: 1.0000 | ORAL_TABLET | Freq: Two times a day (BID) | ORAL | 0 refills | Status: DC

## 2021-06-08 MED ORDER — PREDNISONE 20 MG PO TABS: 40.0000 mg | ORAL_TABLET | Freq: Every day | ORAL | 0 refills | Status: AC

## 2021-06-08 NOTE — Progress Notes (Signed)
? ?  Subjective:  ? ? Patient ID: Ashley [name], female    DOB: 17-Jan-1951, 71 y.o.   MRN: [id number] ? ? ?Chief Complaint: Joint Swelling (Left elbow swelling/) ? ? ?HPI ?Patient come sin c/o left elbow swollen. She was seen last week with same issue and was given prednisone. Got some better then started swelling again with bruising down arm.she dneis any injury ? ? ? ?Review of Systems  ?Musculoskeletal:  Positive for arthralgias (left elbow pain).  ? ?   ?Objective:  ? Physical Exam ?Vitals reviewed.  ?Constitutional:   ?   Appearance: Normal appearance.  ?Cardiovascular:  ?   Rate and Rhythm: Normal rate. Rhythm irregular.  ?Pulmonary:  ?   Effort: Pulmonary effort is normal.  ?   Breath sounds: Normal breath sounds.  ?Musculoskeletal:  ?   Comments: Mild left elbow effusion with tenderness ?Contusion purple and red down forearm which is warm to the touch. ?  ?Neurological:  ?   General: No focal deficit present.  ?   Mental Status: She is alert and oriented to person, place, and time.  ?Psychiatric:     ?   Mood and Affect: Mood normal.     ?   Behavior: Behavior normal.  ? ? ?BP (!) 167/71   Pulse (!) 59   Temp 98 ?F (36.7 ?C) (Temporal)   Resp 20   Ht '5\' 2"'$  (1.575 m)   Wt 268 lb (121.6 kg)   SpO2 96%   BMI 49.02 kg/m?  ? ? ? ?   ?Assessment & Plan:  ? ?Ashley [name] in today with chief complaint of Joint Swelling (Left elbow swelling/) ? ? ?1. Olecranon bursitis of left elbow ?Ice ?elevate ?If not improving RTO ? ?Meds ordered this encounter  ?Medications  ? sulfamethoxazole-trimethoprim (BACTRIM DS) 800-160 MG tablet  ?  Sig: Take 1 tablet by mouth 2 (two) times daily.  ?  Dispense:  20 tablet  ?  Refill:  0  ?  Order Specific Question:   Supervising Provider  ?  Answer:   Caryl Pina A [[id number]]  ? predniSONE (DELTASONE) 20 MG tablet  ?  Sig: Take 2 tablets (40 mg total) by mouth daily with breakfast for 5 days. 2 po daily for 5 days  ?  Dispense:  10 tablet  ?  Refill:  0  ?  Order Specific Question:    Supervising Provider  ?  Answer:   Caryl Pina A [[id number]]  ? ? ? ? ?The above assessment and management plan was discussed with the patient. The patient verbalized understanding of and has agreed to the management plan. Patient is aware to call the clinic if symptoms persist or worsen. Patient is aware when to return to the clinic for a follow-up visit. Patient educated on when it is appropriate to go to the emergency department.  ? ?Mary-Margaret Hassell Done, FNP ? ? ?

## 2021-06-10 ENCOUNTER — Ambulatory Visit (INDEPENDENT_AMBULATORY_CARE_PROVIDER_SITE_OTHER): Payer: Medicare HMO

## 2021-06-10 ENCOUNTER — Telehealth: Payer: Self-pay | Admitting: Nurse Practitioner

## 2021-06-10 ENCOUNTER — Other Ambulatory Visit: Payer: Self-pay

## 2021-06-10 ENCOUNTER — Other Ambulatory Visit: Payer: Medicare HMO

## 2021-06-10 DIAGNOSIS — M25522 Pain in left elbow: Secondary | ICD-10-CM

## 2021-06-10 DIAGNOSIS — M7989 Other specified soft tissue disorders: Secondary | ICD-10-CM | POA: Diagnosis not present

## 2021-06-10 NOTE — Telephone Encounter (Signed)
Spoke with patient and she continues to have pain and bruising with arm and elbow. Advised that we should get an xray. Patient agreed and will come to the office today to have that done. Orders placed ?

## 2021-06-10 NOTE — Telephone Encounter (Signed)
Pt called requesting to speak with nurse Leafy Ro about her arm. Not any better. Needs advise. ?

## 2021-06-11 ENCOUNTER — Telehealth: Payer: Self-pay | Admitting: Nurse Practitioner

## 2021-06-11 NOTE — Telephone Encounter (Signed)
Patient notified. Awaiting to hear if patient needs a work note.  ?

## 2021-06-11 NOTE — Telephone Encounter (Signed)
Please advise on xray's that were done yesterday? ?

## 2021-06-11 NOTE — Telephone Encounter (Signed)
No fractures

## 2021-06-16 ENCOUNTER — Ambulatory Visit (INDEPENDENT_AMBULATORY_CARE_PROVIDER_SITE_OTHER): Payer: Medicare HMO | Admitting: Family Medicine

## 2021-06-16 ENCOUNTER — Encounter: Payer: Self-pay | Admitting: Family Medicine

## 2021-06-16 VITALS — BP 165/80 | HR 69 | Temp 98.7°F | Ht 62.0 in | Wt 262.0 lb

## 2021-06-16 DIAGNOSIS — L03311 Cellulitis of abdominal wall: Secondary | ICD-10-CM | POA: Diagnosis not present

## 2021-06-16 DIAGNOSIS — L0103 Bullous impetigo: Secondary | ICD-10-CM

## 2021-06-16 MED ORDER — DOXYCYCLINE HYCLATE 100 MG PO TABS: 100.0000 mg | ORAL_TABLET | Freq: Two times a day (BID) | ORAL | 0 refills | Status: AC

## 2021-06-16 NOTE — Progress Notes (Signed)
?  ? ?Subjective:  ?Patient ID: Ashley [name], female    DOB: 1950/06/19, 71 y.o.   MRN: [id number] ? ?Patient Care Team: ?Chevis Pretty, FNP as PCP - General (Nurse Practitioner) ?Arnoldo Lenis, MD as PCP - Cardiology (Cardiology) ?Netta Cedars, MD as Consulting Physician (Orthopedic Surgery) ?Harlen Labs, MD as Referring Physician (Optometry) ?Sandford Craze, MD as Referring Physician (Dermatology)  ? ?Chief Complaint:  Blister ? ? ?HPI: ?Ashley [name] is a 71 y.o. female presenting on 06/16/2021 for Blister ? ? ?Pt presents today for evaluation of a wound on her right lower abdomen. She states a few days ago she developed a large blister. She called and was told it was ok to pop blister and to keep it clean with soap and water. She has been applying polysporin to the area but reports it is not getting better. She now has surrounding redness with tenderness. No fever, chills, weakness, or confusion. Picture of blister shown to provider consistent with bullous impetigo.  ? ? ? ?Relevant past medical, surgical, family, and social history reviewed and updated as indicated.  ?Allergies and medications reviewed and updated. Data reviewed: Chart in Epic. ? ? ?Past Medical History:  ?Diagnosis Date  ? Depression   ? Diabetes mellitus without complication ([place])   ? Hyperlipidemia   ? Hypertension   ? ? ?Past Surgical History:  ?Procedure Laterality Date  ? CYST EXCISION Left 03/15/2017  ? from buttocks  ? ORIF ANKLE FRACTURE Left 04/20/2017  ? Procedure: OPEN REDUCTION INTERNAL FIXATION (ORIF) LEFT ANKLE FRACTURE;  Surgeon: Netta Cedars, MD;  Location: [facility];  Service: Orthopedics;  Laterality: Left;  ? ? ?Social History  ? ?Socioeconomic History  ? Marital status: Widowed  ?  Spouse name: Not on file  ? Number of children: 2  ? Years of education: 68  ? Highest education level: High school graduate  ?Occupational History  ? Occupation: School bus driver  ?  Employer: [employer]  ?Tobacco Use  ?  Smoking status: Never  ? Smokeless tobacco: Never  ?Vaping Use  ? Vaping Use: Never used  ?Substance and Sexual Activity  ? Alcohol use: No  ? Drug use: No  ? Sexual activity: Not Currently  ?Other Topics Concern  ? Not on file  ?Social History Narrative  ? Lives alone. Driving bus part time. Daughter lives across the street.  ? Enjoys gardening and quilting. Physical activity limited due to ankle pain since fx in 2019  ? ?Social Determinants of Health  ? ?Financial Resource Strain: Low Risk   ? Difficulty of Paying Living Expenses: Not hard at all  ?Food Insecurity: No Food Insecurity  ? Worried About Charity fundraiser in the Last Year: Never true  ? Ran Out of Food in the Last Year: Never true  ?Transportation Needs: No Transportation Needs  ? Lack of Transportation (Medical): No  ? Lack of Transportation (Non-Medical): No  ?Physical Activity: Insufficiently Active  ? Days of Exercise per Week: 7 days  ? Minutes of Exercise per Session: 10 min  ?Stress: No Stress Concern Present  ? Feeling of Stress : Not at all  ?Social Connections: Moderately Integrated  ? Frequency of Communication with Friends and Family: More than three times a week  ? Frequency of Social Gatherings with Friends and Family: More than three times a week  ? Attends Religious Services: More than 4 times per year  ? Active Member of Clubs or Organizations: Yes  ?  Attends Archivist Meetings: More than 4 times per year  ? Marital Status: Widowed  ?Intimate Partner Violence: Not At Risk  ? Fear of Current or Ex-Partner: No  ? Emotionally Abused: No  ? Physically Abused: No  ? Sexually Abused: No  ? ? ?Outpatient Encounter Medications as of 06/16/2021  ?Medication Sig  ? Accu-Chek Softclix Lancets lancets TEST BLOOD SUGAR TWICE DAILY Dx E11.9  ? acetaminophen (TYLENOL) 500 MG tablet Take 1,000 mg by mouth every 6 (six) hours as needed (for pain/headaches.).  ? albuterol (VENTOLIN HFA) 108 (90 Base) MCG/ACT inhaler INHALE 2 PUFFS INTO THE  LUNGS EVERY 6 HOURS AS NEEDED FOR WHEEZE OR SHORTNESS OF BREATH  ? Alcohol Swabs (DROPSAFE ALCOHOL PREP) 70 % PADS CHECK BLOOD SUGAR TWICE DAILY  ? ALPRAZolam (XANAX) 0.25 MG tablet Take 1 tablet (0.25 mg total) by mouth daily as needed for anxiety.  ? amLODipine (NORVASC) 5 MG tablet Take 1 tablet (5 mg total) by mouth daily.  ? apixaban (ELIQUIS) 5 MG TABS tablet Take 1 tablet (5 mg total) by mouth 2 (two) times daily.  ? Blood Glucose Monitoring Suppl (ACCU-CHEK AVIVA PLUS) w/Device KIT USE AS DIRECTED  ? Blood Glucose Monitoring Suppl (ACCU-CHEK GUIDE ME) w/Device KIT 1 kit by Does not apply route 2 (two) times daily.  ? CINNAMON PO Take 1 capsule by mouth daily.   ? cloNIDine (CATAPRES) 0.2 MG tablet Take 1 tablet (0.2 mg total) by mouth 2 (two) times daily.  ? clotrimazole-betamethasone (LOTRISONE) cream APPLY TO AFFECTED AREA TWICE A DAY  ? diclofenac Sodium (VOLTAREN) 1 % GEL Apply 4 g topically 4 (four) times daily.  ? doxycycline (VIBRA-TABS) 100 MG tablet Take 1 tablet (100 mg total) by mouth 2 (two) times daily for 10 days. 1 po bid  ? furosemide (LASIX) 20 MG tablet Take 1 tablet (20 mg total) by mouth daily.  ? Ginger, Zingiber officinalis, (GINGER ROOT PO) Take 1 capsule by mouth daily.   ? glipiZIDE (GLUCOTROL) 5 MG tablet Take 1 tablet (5 mg total) by mouth 2 (two) times daily.  ? glucose blood (ACCU-CHEK AVIVA PLUS) test strip TEST BLOOD SUGAR TWICE DAILY Dx E11.9  ? metFORMIN (GLUCOPHAGE) 1000 MG tablet Take 1 tablet (1,000 mg total) by mouth 2 (two) times daily with a meal.  ? metoprolol tartrate (LOPRESSOR) 50 MG tablet Take 1 tablet (50 mg total) by mouth 2 (two) times daily.  ? nystatin cream (MYCOSTATIN) Apply 1 application topically 2 (two) times daily.  ? quinapril (ACCUPRIL) 40 MG tablet Take 1 tablet (40 mg total) by mouth daily.  ? simvastatin (ZOCOR) 40 MG tablet TAKE 1 TABLET EVERY EVENING  ? Turmeric Curcumin 500 MG CAPS Take 500 mg by mouth daily.   ? [DISCONTINUED]  sulfamethoxazole-trimethoprim (BACTRIM DS) 800-160 MG tablet Take 1 tablet by mouth 2 (two) times daily.  ? ?No facility-administered encounter medications on file as of 06/16/2021.  ? ? ?Allergies  ?Allergen Reactions  ? Ciprofloxacin Hcl Hives  ? ? ?Review of Systems  ?Constitutional:  Negative for activity change, appetite change, chills, diaphoresis, fatigue, fever and unexpected weight change.  ?Respiratory:  Negative for cough and shortness of breath.   ?Cardiovascular:  Negative for chest pain, palpitations and leg swelling.  ?Genitourinary:  Negative for decreased urine volume and difficulty urinating.  ?Skin:  Positive for color change and wound.  ?Neurological:  Negative for weakness and headaches.  ?Psychiatric/Behavioral:  Negative for confusion.   ?All other systems reviewed and  are negative. ? ?   ? ?Objective:  ?BP (!) 165/80   Pulse 69   Temp 98.7 ?F (37.1 ?C)   Ht '5\' 2"'  (1.575 m)   Wt 262 lb (118.8 kg)   SpO2 96%   BMI 47.92 kg/m?   ? ?Wt Readings from Last 3 Encounters:  ?06/16/21 262 lb (118.8 kg)  ?06/08/21 268 lb (121.6 kg)  ?06/03/21 266 lb (120.7 kg)  ? ? ?Physical Exam ?Vitals and nursing note reviewed.  ?Constitutional:   ?   General: She is not in acute distress. ?   Appearance: She is obese. She is not ill-appearing, toxic-appearing or diaphoretic.  ?HENT:  ?   Head: Normocephalic and atraumatic.  ?Eyes:  ?   Pupils: Pupils are equal, round, and reactive to light.  ?Cardiovascular:  ?   Rate and Rhythm: Normal rate. Rhythm irregularly irregular.  ?Abdominal:  ?   General: Bowel sounds are normal.  ?   Palpations: Abdomen is soft.  ?Skin: ?   General: Skin is warm and dry.  ?   Capillary Refill: Capillary refill takes less than 2 seconds.  ?   Findings: Erythema and wound present.  ? ?    ?Neurological:  ?   General: No focal deficit present.  ?   Mental Status: She is alert and oriented to person, place, and time.  ?Psychiatric:     ?   Mood and Affect: Mood normal.     ?   Behavior:  Behavior normal.     ?   Thought Content: Thought content normal.     ?   Judgment: Judgment normal.  ? ? ? ?Results for orders placed or performed in visit on 03/03/21  ?Bayer DCA Hb A1c Waived  ?Result Value Ref Range  ?

## 2021-06-25 ENCOUNTER — Other Ambulatory Visit: Payer: Self-pay

## 2021-06-25 MED ORDER — SULFAMETHOXAZOLE-TRIMETHOPRIM 800-160 MG PO TABS: 1.0000 | ORAL_TABLET | Freq: Two times a day (BID) | ORAL | 0 refills | Status: DC

## 2021-06-25 NOTE — Progress Notes (Signed)
Patient contacted office stating that her wound to her stomach looked a little worse and she has some more places breaking out around the original area. Finishes antibiotic today. Sent in Bactrim BID for 7 days. If no improvement patient needs to see wound care asap. Patient verbalized understanding

## 2021-07-08 ENCOUNTER — Other Ambulatory Visit: Payer: Self-pay

## 2021-07-08 DIAGNOSIS — L03311 Cellulitis of abdominal wall: Secondary | ICD-10-CM

## 2021-07-15 ENCOUNTER — Ambulatory Visit: Payer: Medicare HMO

## 2021-07-16 ENCOUNTER — Other Ambulatory Visit: Payer: Self-pay | Admitting: Nurse Practitioner

## 2021-07-16 MED ORDER — DOXYCYCLINE HYCLATE 100 MG PO TABS: 100.0000 mg | ORAL_TABLET | Freq: Two times a day (BID) | ORAL | 0 refills | Status: DC

## 2021-07-16 NOTE — Progress Notes (Signed)
Cellulitis on abdominal wall no better. Cannot see wound care until July 5th. Need to be on antibiotic.  Meds ordered this encounter  Medications   doxycycline (VIBRA-TABS) 100 MG tablet    Sig: Take 1 tablet (100 mg total) by mouth 2 (two) times daily. 1 po bid    Dispense:  20 tablet    Refill:  0    Order Specific Question:   Supervising Provider    Answer:   Worthy Rancher [[id number]]   [place], FNP

## 2021-07-19 ENCOUNTER — Ambulatory Visit (INDEPENDENT_AMBULATORY_CARE_PROVIDER_SITE_OTHER): Payer: Medicare HMO

## 2021-07-19 ENCOUNTER — Telehealth: Payer: Self-pay

## 2021-07-19 VITALS — Ht 62.0 in | Wt 262.0 lb

## 2021-07-19 DIAGNOSIS — Z0000 Encounter for general adult medical examination without abnormal findings: Secondary | ICD-10-CM | POA: Diagnosis not present

## 2021-07-19 DIAGNOSIS — Z599 Problem related to housing and economic circumstances, unspecified: Secondary | ICD-10-CM

## 2021-07-19 DIAGNOSIS — I4819 Other persistent atrial fibrillation: Secondary | ICD-10-CM

## 2021-07-19 NOTE — Patient Instructions (Signed)
Ms. Ashley [name] , Thank you for taking time to come for your Medicare Wellness Visit. I appreciate your ongoing commitment to your health goals. Please review the following plan we discussed and let me know if I can assist you in the future.   Screening recommendations/referrals: Colonoscopy: FOB done 06/03/2021.  Mammogram: Done 02/04/2021 Repeat annually  Bone Density: Done 11/30/2020 Repeat every 2 years  Recommended yearly ophthalmology/optometry visit for glaucoma screening and checkup Recommended yearly dental visit for hygiene and checkup  Vaccinations: Influenza vaccine: Done 11/24/2020 Repeat annually  Pneumococcal vaccine: Done 11/02/2018 and 05/03/2017. Tdap vaccine: Done 08/18/2017 Repeat in 10 years  Shingles vaccine: Done 11/07/2017, 08/18/2017 and 01/08/2013   Covid-19:done 03/13/2019 and 04/10/2019.  Advanced directives: Copy in chart.  Conditions/risks identified: Aim for 30 minutes of exercise or brisk walking, 6-8 glasses of water, and 5 servings of fruits and vegetables each day.   Next appointment: Follow up in one year for your annual wellness visit 2024.   Preventive Care 44 Years and Older, Female Preventive care refers to lifestyle choices and visits with your health care provider that can promote health and wellness. What does preventive care include? A yearly physical exam. This is also called an annual well check. Dental exams once or twice a year. Routine eye exams. Ask your health care provider how often you should have your eyes checked. Personal lifestyle choices, including: Daily care of your teeth and gums. Regular physical activity. Eating a healthy diet. Avoiding tobacco and drug use. Limiting alcohol use. Practicing safe sex. Taking low-dose aspirin every day. Taking vitamin and mineral supplements as recommended by your health care provider. What happens during an annual well check? The services and screenings done by your health care provider during  your annual well check will depend on your age, overall health, lifestyle risk factors, and family history of disease. Counseling  Your health care provider may ask you questions about your: Alcohol use. Tobacco use. Drug use. Emotional well-being. Home and relationship well-being. Sexual activity. Eating habits. History of falls. Memory and ability to understand (cognition). Work and work Statistician. Reproductive health. Screening  You may have the following tests or measurements: Height, weight, and BMI. Blood pressure. Lipid and cholesterol levels. These may be checked every 5 years, or more frequently if you are over 48 years old. Skin check. Lung cancer screening. You may have this screening every year starting at age 75 if you have a 30-pack-year history of smoking and currently smoke or have quit within the past 15 years. Fecal occult blood test (FOBT) of the stool. You may have this test every year starting at age 5. Flexible sigmoidoscopy or colonoscopy. You may have a sigmoidoscopy every 5 years or a colonoscopy every 10 years starting at age 90. Hepatitis C blood test. Hepatitis B blood test. Sexually transmitted disease (STD) testing. Diabetes screening. This is done by checking your blood sugar (glucose) after you have not eaten for a while (fasting). You may have this done every 1-3 years. Bone density scan. This is done to screen for osteoporosis. You may have this done starting at age 12. Mammogram. This may be done every 1-2 years. Talk to your health care provider about how often you should have regular mammograms. Talk with your health care provider about your test results, treatment options, and if necessary, the need for more tests. Vaccines  Your health care provider may recommend certain vaccines, such as: Influenza vaccine. This is recommended every year. Tetanus, diphtheria, and acellular  pertussis (Tdap, Td) vaccine. You may need a Td booster every 10  years. Zoster vaccine. You may need this after age 87. Pneumococcal 13-valent conjugate (PCV13) vaccine. One dose is recommended after age 51. Pneumococcal polysaccharide (PPSV23) vaccine. One dose is recommended after age 65. Talk to your health care provider about which screenings and vaccines you need and how often you need them. This information is not intended to replace advice given to you by your health care provider. Make sure you discuss any questions you have with your health care provider. Document Released: 02/20/2015 Document Revised: 10/14/2015 Document Reviewed: 11/25/2014 Elsevier Interactive Patient Education  2017 [employer] Prevention in the Home Falls can cause injuries. They can happen to people of all ages. There are many things you can do to make your home safe and to help prevent falls. What can I do on the outside of my home? Regularly fix the edges of walkways and driveways and fix any cracks. Remove anything that might make you trip as you walk through a door, such as a raised step or threshold. Trim any bushes or trees on the path to your home. Use bright outdoor lighting. Clear any walking paths of anything that might make someone trip, such as rocks or tools. Regularly check to see if handrails are loose or broken. Make sure that both sides of any steps have handrails. Any raised decks and porches should have guardrails on the edges. Have any leaves, snow, or ice cleared regularly. Use sand or salt on walking paths during winter. Clean up any spills in your garage right away. This includes oil or grease spills. What can I do in the bathroom? Use night lights. Install grab bars by the toilet and in the tub and shower. Do not use towel bars as grab bars. Use non-skid mats or decals in the tub or shower. If you need to sit down in the shower, use a plastic, non-slip stool. Keep the floor dry. Clean up any water that spills on the floor as soon as it  happens. Remove soap buildup in the tub or shower regularly. Attach bath mats securely with double-sided non-slip rug tape. Do not have throw rugs and other things on the floor that can make you trip. What can I do in the bedroom? Use night lights. Make sure that you have a light by your bed that is easy to reach. Do not use any sheets or blankets that are too big for your bed. They should not hang down onto the floor. Have a firm chair that has side arms. You can use this for support while you get dressed. Do not have throw rugs and other things on the floor that can make you trip. What can I do in the kitchen? Clean up any spills right away. Avoid walking on wet floors. Keep items that you use a lot in easy-to-reach places. If you need to reach something above you, use a strong step stool that has a grab bar. Keep electrical cords out of the way. Do not use floor polish or wax that makes floors slippery. If you must use wax, use non-skid floor wax. Do not have throw rugs and other things on the floor that can make you trip. What can I do with my stairs? Do not leave any items on the stairs. Make sure that there are handrails on both sides of the stairs and use them. Fix handrails that are broken or loose. Make sure that handrails  are as long as the stairways. Check any carpeting to make sure that it is firmly attached to the stairs. Fix any carpet that is loose or worn. Avoid having throw rugs at the top or bottom of the stairs. If you do have throw rugs, attach them to the floor with carpet tape. Make sure that you have a light switch at the top of the stairs and the bottom of the stairs. If you do not have them, ask someone to add them for you. What else can I do to help prevent falls? Wear shoes that: Do not have high heels. Have rubber bottoms. Are comfortable and fit you well. Are closed at the toe. Do not wear sandals. If you use a stepladder: Make sure that it is fully opened.  Do not climb a closed stepladder. Make sure that both sides of the stepladder are locked into place. Ask someone to hold it for you, if possible. Clearly mark and make sure that you can see: Any grab bars or handrails. First and last steps. Where the edge of each step is. Use tools that help you move around (mobility aids) if they are needed. These include: Canes. Walkers. Scooters. Crutches. Turn on the lights when you go into a dark area. Replace any light bulbs as soon as they burn out. Set up your furniture so you have a clear path. Avoid moving your furniture around. If any of your floors are uneven, fix them. If there are any pets around you, be aware of where they are. Review your medicines with your doctor. Some medicines can make you feel dizzy. This can increase your chance of falling. Ask your doctor what other things that you can do to help prevent falls. This information is not intended to replace advice given to you by your health care provider. Make sure you discuss any questions you have with your health care provider. Document Released: 11/20/2008 Document Revised: 07/02/2015 Document Reviewed: 02/28/2014 Elsevier Interactive Patient Education  2017 [employer].

## 2021-07-19 NOTE — Telephone Encounter (Signed)
Yes please do referral thank you

## 2021-07-19 NOTE — Telephone Encounter (Signed)
Pt c/o issues with being able to afford Elquis. Discussed CCM referral. Pt is agreeable. Are you okay with me doing this referral? Thank you!

## 2021-07-19 NOTE — Progress Notes (Signed)
Subjective:   Ashley [name] is a 71 y.o. female who presents for Medicare Annual (Subsequent) preventive examination. Virtual Visit via Telephone Note  I connected with  Kleigh Hoelzer on 07/19/21 at 12:00 PM EDT by telephone and verified that I am speaking with the correct person using two identifiers.  Location: Patient: HOME Provider: [facility] Persons participating in the virtual visit: patient/Nurse Health Advisor   I discussed the limitations, risks, security and privacy concerns of performing an evaluation and management service by telephone and the availability of in person appointments. The patient expressed understanding and agreed to proceed.  Interactive audio and video telecommunications were attempted between this nurse and patient, however failed, due to patient having technical difficulties OR patient did not have access to video capability.  We continued and completed visit with audio only.  Some vital signs may be absent or patient reported.   Chriss Driver, LPN  Review of Systems     Cardiac Risk Factors include: advanced age (>52mn, >>25women);diabetes mellitus;hypertension;dyslipidemia;sedentary lifestyle;obesity (BMI >30kg/m2)     Objective:    Today's Vitals   07/19/21 1158  Weight: 262 lb (118.8 kg)  Height: '5\' 2"'  (1.575 m)   Body mass index is 47.92 kg/m.     07/19/2021   12:12 PM 04/16/2019   10:28 AM 04/13/2018   11:45 AM 04/20/2017    9:53 AM  Advanced Directives  Does Patient Have a Medical Advance Directive? Yes Yes Yes No  Type of [place] will [place] will [place] will   Does patient want to make changes to medical advance directive?  No - Patient declined No - Patient declined   Copy of [place] Chart? Yes - validated most recent copy scanned in chart (See row information) Yes - validated most recent copy scanned in chart  (See row information) No - copy requested   Would patient like information on creating a medical advance directive?    No - Patient declined    Current Medications (verified) Outpatient Encounter Medications as of 07/19/2021  Medication Sig   Accu-Chek Softclix Lancets lancets TEST BLOOD SUGAR TWICE DAILY Dx E11.9   acetaminophen (TYLENOL) 500 MG tablet Take 1,000 mg by mouth every 6 (six) hours as needed (for pain/headaches.).   albuterol (VENTOLIN HFA) 108 (90 Base) MCG/ACT inhaler INHALE 2 PUFFS INTO THE LUNGS EVERY 6 HOURS AS NEEDED FOR WHEEZE OR SHORTNESS OF BREATH   Alcohol Swabs (DROPSAFE ALCOHOL PREP) 70 % PADS CHECK BLOOD SUGAR TWICE DAILY   ALPRAZolam (XANAX) 0.25 MG tablet Take 1 tablet (0.25 mg total) by mouth daily as needed for anxiety.   amLODipine (NORVASC) 5 MG tablet Take 1 tablet (5 mg total) by mouth daily.   apixaban (ELIQUIS) 5 MG TABS tablet Take 1 tablet (5 mg total) by mouth 2 (two) times daily.   Blood Glucose Monitoring Suppl (ACCU-CHEK AVIVA PLUS) w/Device KIT USE AS DIRECTED   Blood Glucose Monitoring Suppl (ACCU-CHEK GUIDE ME) w/Device KIT 1 kit by Does not apply route 2 (two) times daily.   CINNAMON PO Take 1 capsule by mouth daily.    cloNIDine (CATAPRES) 0.2 MG tablet Take 1 tablet (0.2 mg total) by mouth 2 (two) times daily.   clotrimazole-betamethasone (LOTRISONE) cream APPLY TO AFFECTED AREA TWICE A DAY   diclofenac Sodium (VOLTAREN) 1 % GEL Apply 4 g topically 4 (four) times daily.   doxycycline (VIBRA-TABS) 100 MG tablet Take  1 tablet (100 mg total) by mouth 2 (two) times daily. 1 po bid   furosemide (LASIX) 20 MG tablet Take 1 tablet (20 mg total) by mouth daily.   Ginger, Zingiber officinalis, (GINGER ROOT PO) Take 1 capsule by mouth daily.    glipiZIDE (GLUCOTROL) 5 MG tablet Take 1 tablet (5 mg total) by mouth 2 (two) times daily.   glucose blood (ACCU-CHEK AVIVA PLUS) test strip TEST BLOOD SUGAR TWICE DAILY Dx E11.9   metFORMIN (GLUCOPHAGE) 1000 MG  tablet Take 1 tablet (1,000 mg total) by mouth 2 (two) times daily with a meal.   metoprolol tartrate (LOPRESSOR) 50 MG tablet Take 1 tablet (50 mg total) by mouth 2 (two) times daily.   nystatin cream (MYCOSTATIN) Apply 1 application topically 2 (two) times daily.   quinapril (ACCUPRIL) 40 MG tablet Take 1 tablet (40 mg total) by mouth daily.   simvastatin (ZOCOR) 40 MG tablet TAKE 1 TABLET EVERY EVENING   sulfamethoxazole-trimethoprim (BACTRIM DS) 800-160 MG tablet Take 1 tablet by mouth 2 (two) times daily.   Turmeric Curcumin 500 MG CAPS Take 500 mg by mouth daily.    No facility-administered encounter medications on file as of 07/19/2021.    Allergies (verified) Ciprofloxacin hcl   History: Past Medical History:  Diagnosis Date   Depression    Diabetes mellitus without complication ([place])    Hyperlipidemia    Hypertension    Past Surgical History:  Procedure Laterality Date   CYST EXCISION Left 03/15/2017   from buttocks   ORIF ANKLE FRACTURE Left 04/20/2017   Procedure: OPEN REDUCTION INTERNAL FIXATION (ORIF) LEFT ANKLE FRACTURE;  Surgeon: Netta Cedars, MD;  Location: [facility] [place];  Service: Orthopedics;  Laterality: Left;   Family History  Problem Relation Age of Onset   Diabetes Mother    CAD Mother        CABG at age 54   Hypertension Mother    Hyperlipidemia Mother    Diabetes Father    Heart disease Father        CHF   Hypertension Father    Diabetes Sister    CAD Sister    Hypertension Sister    Diabetes Brother    Hypertension Brother    Healthy Sister    Social History   Socioeconomic History   Marital status: Widowed    Spouse name: Not on file   Number of children: 2   Years of education: 12   Highest education level: High school graduate  Occupational History   Occupation: School bus Education administrator: [employer]  Tobacco Use   Smoking status: Never   Smokeless tobacco: Never  Vaping Use   Vaping Use: Never used  Substance and Sexual  Activity   Alcohol use: No   Drug use: No   Sexual activity: Not Currently  Other Topics Concern   Not on file  Social History Narrative   Lives alone. Driving bus part time. Daughter lives across the street.   Enjoys gardening and quilting. Physical activity limited due to ankle pain since fx in 2019   Social Determinants of Health   Financial Resource Strain: Medium Risk (07/19/2021)   Overall Financial Resource Strain (CARDIA)    Difficulty of Paying Living Expenses: Somewhat hard  Food Insecurity: No Food Insecurity (07/19/2021)   Hunger Vital Sign    Worried About Running Out of Food in the Last Year: Never true    Ran Out of Food in the Last Year:  Never true  Transportation Needs: No Transportation Needs (07/19/2021)   PRAPARE - Hydrologist (Medical): No    Lack of Transportation (Non-Medical): No  Physical Activity: Insufficiently Active (07/19/2021)   Exercise Vital Sign    Days of Exercise per Week: 3 days    Minutes of Exercise per Session: 30 min  Stress: No Stress Concern Present (07/19/2021)   [place]    Feeling of Stress : Not at all  Social Connections: [place] (07/19/2021)   Social Connection and Isolation Panel [NHANES]    Frequency of Communication with Friends and Family: More than three times a week    Frequency of Social Gatherings with Friends and Family: More than three times a week    Attends Religious Services: More than 4 times per year    Active Member of [employer] or Organizations: Yes    Attends Music therapist: More than 4 times per year    Marital Status: Married    Tobacco Counseling Counseling given: Not Answered   Clinical Intake:  Pre-visit preparation completed: Yes  Pain : No/denies pain     BMI - recorded: 47.92 Nutritional Status: BMI > 30  Obese Nutritional Risks: None Diabetes: Yes  How often do you need to  have someone help you when you read instructions, pamphlets, or other written materials from your doctor or pharmacy?: 1 - Never  Diabetic?Nutrition Risk Assessment:  Has the patient had any N/V/D within the last 2 months?  No  Does the patient have any non-healing wounds?  No  Has the patient had any unintentional weight loss or weight gain?  No   Diabetes:  Is the patient diabetic?  Yes  If diabetic, was a CBG obtained today?  No  Did the patient bring in their glucometer from home?  No  How often do you monitor your CBG's? 2-3x/wk.   Financial Strains and Diabetes Management:  Are you having any financial strains with the device, your supplies or your medication? No .  Does the patient want to be seen by Chronic Care Management for management of their diabetes?  No  Would the patient like to be referred to a Nutritionist or for Diabetic Management?  No   Diabetic Exams:  Diabetic Eye Exam: Completed 08/21/2020. Pt has been advised about the importance in completing this exam.  Diabetic Foot Exam: Completed 05/26/2020. Pt has been advised about the importance in completing this exam.   Interpreter Needed?: No  Information entered by :: mj Shaqueena Mauceri, lpn   Activities of Daily Living    07/19/2021   12:14 PM  In your present state of health, do you have any difficulty performing the following activities:  Hearing? 0  Vision? 0  Difficulty concentrating or making decisions? 0  Walking or climbing stairs? 0  Dressing or bathing? 0  Doing errands, shopping? 0  Preparing Food and eating ? N  Using the Toilet? N  In the past six months, have you accidently leaked urine? Y  Comment At times.  Do you have problems with loss of bowel control? Y  Comment At times due to Metformin.  Managing your Medications? N  Managing your Finances? N  Housekeeping or managing your Housekeeping? N    Patient Care Team: Chevis Pretty, FNP as PCP - General (Nurse Practitioner) Arnoldo Lenis, MD as PCP - Cardiology (Cardiology) Netta Cedars, MD as Consulting Physician (Orthopedic Surgery) [place] Comment,  Allison Quarry, MD as Referring Physician (Optometry) Sandford Craze, MD as Referring Physician (Dermatology)  Indicate any recent Medical Services you may have received from other than [facility] providers in the past year (date may be approximate).     Assessment:   This is a routine wellness examination for Ashley [name].  Hearing/Vision screen Hearing Screening - Comments:: No hearing issues.  Vision Screening - Comments:: Readers. [facility] 08/2020.  Dietary issues and exercise activities discussed: Current Exercise Habits: The patient does not participate in regular exercise at present, Exercise limited by: cardiac condition(s);orthopedic condition(s)   Goals Addressed             This Visit's Progress    Exercise 3 times per week for 30 minutes    Not on track    Walking and riding stationary bicycle are great options.       Depression Screen    07/19/2021   12:06 PM 06/16/2021    2:49 PM 06/08/2021   12:23 PM 06/03/2021   11:01 AM 05/18/2021    2:04 PM 03/03/2021   10:13 AM 11/30/2020   11:44 AM  PHQ 2/9 Scores  PHQ - 2 Score 1 0 0 0 0 1 0  PHQ- 9 Score '1 2    3 2    ' Fall Risk    07/19/2021   12:12 PM 06/16/2021    2:50 PM 06/08/2021   12:23 PM 06/03/2021   11:01 AM 05/18/2021    2:04 PM  [place] in the past year? 0 0 0 0 0  Number falls in past yr: 0      Injury with Fall? 0      Risk for fall due to : No Fall Risks      Follow up Falls prevention discussed        FALL RISK PREVENTION PERTAINING TO THE HOME:  Any stairs in or around the home? Yes  If so, are there any without handrails? No  Home free of loose throw rugs in walkways, pet beds, electrical cords, etc? Yes  Adequate lighting in your home to reduce risk of falls? Yes   ASSISTIVE DEVICES UTILIZED TO PREVENT FALLS:  Life alert? No  Use of a cane, walker or w/c? No  Grab bars  in the bathroom? No  Shower chair or bench in shower? Yes  Elevated toilet seat or a handicapped toilet? Yes   TIMED UP AND GO:  Was the test performed? No .  PHONE VISIT.  Cognitive Function:    04/13/2018    1:53 PM 04/13/2018    1:33 PM  MMSE - Mini Mental State Exam  Orientation to time 5 5  Orientation to Place 5 5  Registration 3   Attention/ Calculation 4   Recall 1   Language- name 2 objects 2   Language- repeat 1   Language- follow 3 step command 3   Language- read & follow direction 1   Write a sentence 1   Copy design 1   Total score 27         07/19/2021   12:15 PM 04/16/2019   10:30 AM  6CIT Screen  What Year? 0 points 0 points  What month? 0 points 0 points  What time? 0 points 0 points  Count back from 20 0 points 0 points  Months in reverse 2 points 0 points  Repeat phrase 0 points 0 points  Total Score 2 points 0 points  Immunizations Immunization History  Administered Date(s) Administered   Fluad Quad(high Dose 65+) 11/02/2018, 11/24/2020   Influenza, High Dose Seasonal PF 11/07/2017   Influenza,inj,Quad PF,6+ Mos 10/31/2012, 11/14/2014, 11/03/2016   Influenza-Unspecified 10/25/2013, 11/03/2016   Moderna Sars-Covid-2 Vaccination 03/13/2019, 04/10/2019   Pneumococcal Conjugate-13 11/02/2018   Pneumococcal Polysaccharide-23 10/31/2012, 05/03/2017   Tdap 08/18/2017   Zoster Recombinat (Shingrix) 08/18/2017, 11/07/2017   Zoster, Live 01/08/2013    TDAP status: Up to date  Flu Vaccine status: Up to date  Pneumococcal vaccine status: Up to date  Covid-19 vaccine status: Completed vaccines  Qualifies for Shingles Vaccine? Yes   Zostavax completed Yes   Shingrix Completed?: Yes  Screening Tests Health Maintenance  Topic Date Due   COVID-19 Vaccine (3 - Moderna series) 08/04/2021 (Originally 06/05/2019)   FOOT EXAM  08/06/2021 (Originally 05/26/2021)   Hepatitis C Screening  08/06/2021 (Originally 04/06/1968)   COLONOSCOPY (Pts 45-17yr  Insurance coverage will need to be confirmed)  07/20/2022 (Originally 03/22/2020)   HEMOGLOBIN A1C  08/31/2021   INFLUENZA VACCINE  09/07/2021   OPHTHALMOLOGY EXAM  09/14/2021   MAMMOGRAM  02/04/2022   DEXA SCAN  12/01/2022   TETANUS/TDAP  08/19/2027   Pneumonia Vaccine 71 Years old  Completed   Zoster Vaccines- Shingrix  Completed   HPV VACCINES  Aged Out    Health Maintenance  There are no preventive care reminders to display for this patient.   Colorectal cancer screening: Type of screening: FOBT/FIT. Completed 06/03/2021. Repeat every 1 years  Mammogram status: Completed 02/04/2021. Repeat every year  Bone Density status: Completed 11/30/20. Results reflect: Bone density results: NORMAL. Repeat every 2 years.  Lung Cancer Screening: (Low Dose CT Chest recommended if Age 69-80years, 30 pack-year currently smoking OR have quit w/in 15years.) does not qualify.   Additional Screening:  Hepatitis C Screening: does qualify; Completed DUE  Vision Screening: Recommended annual ophthalmology exams for early detection of glaucoma and other disorders of the eye. Is the patient up to date with their annual eye exam?  Yes  Who is the provider or what is the name of the office in which the patient attends annual eye exams? [facility] If pt is not established with a provider, would they like to be referred to a provider to establish care? No .   Dental Screening: Recommended annual dental exams for proper oral hygiene  Community Resource Referral / Chronic Care Management: CRR required this visit?  No   CCM required this visit?  No      Plan:     I have personally reviewed and noted the following in the patient's chart:   Medical and social history Use of alcohol, tobacco or illicit drugs  Current medications and supplements including opioid prescriptions.  Functional ability and status Nutritional status Physical activity Advanced directives List of other  physicians Hospitalizations, surgeries, and ER visits in previous 12 months Vitals Screenings to include cognitive, depression, and falls Referrals and appointments  In addition, I have reviewed and discussed with patient certain preventive protocols, quality metrics, and best practice recommendations. A written personalized care plan for preventive services as well as general preventive health recommendations were provided to patient.     MChriss Driver LPN   [id number]  Nurse Notes: Pt is up to date on vaccines and health maintenance.

## 2021-07-19 NOTE — Addendum Note (Signed)
Addended by: Randal Buba K on: 07/19/2021 03:15 PM   Modules accepted: Orders

## 2021-07-20 ENCOUNTER — Telehealth: Payer: Self-pay

## 2021-07-20 NOTE — Chronic Care Management (AMB) (Signed)
Chronic Care Management   Note  07/20/2021 Name: Ashley [name] MRN: [id number] DOB: Nov 23, 1950  Ashley [name] is a 72 y.o. year old female who is a primary care patient of Bennie Pierini, FNP. I reached out to [place] by phone today in response to a referral sent by Ms. Lupita Leash Delacruz's PCP.  Ms. Biles was given information about Chronic Care Management services today including:  CCM service includes personalized support from designated clinical staff supervised by her physician, including individualized plan of care and coordination with other care providers 24/7 contact phone numbers for assistance for urgent and routine care needs. Service will only be billed when office clinical staff spend 20 minutes or more in a month to coordinate care. Only one practitioner may furnish and bill the service in a calendar month. The patient may stop CCM services at any time (effective at the end of the month) by phone call to the office staff. The patient is responsible for co-pay (up to 20% after annual deductible is met) if co-pay is required by the individual health plan.   Patient did not agree to enrollment in care management services and does not wish to consider at this time.  Follow up plan: Patient declines engagement by the care management team. Appropriate care team members and provider have been notified via electronic communication.   Penne Lash, RMA Care Guide, Embedded Care Coordination [facility]  [place], [place] [address] Direct Dial: [phone] [email or web address]: [email or web address]

## 2021-08-04 ENCOUNTER — Other Ambulatory Visit: Payer: Self-pay | Admitting: Nurse Practitioner

## 2021-08-04 DIAGNOSIS — R609 Edema, unspecified: Secondary | ICD-10-CM

## 2021-08-04 DIAGNOSIS — I4819 Other persistent atrial fibrillation: Secondary | ICD-10-CM

## 2021-08-04 DIAGNOSIS — E1159 Type 2 diabetes mellitus with other circulatory complications: Secondary | ICD-10-CM

## 2021-08-17 ENCOUNTER — Other Ambulatory Visit: Payer: Self-pay | Admitting: Nurse Practitioner

## 2021-08-17 DIAGNOSIS — I4819 Other persistent atrial fibrillation: Secondary | ICD-10-CM

## 2021-08-17 DIAGNOSIS — E1159 Type 2 diabetes mellitus with other circulatory complications: Secondary | ICD-10-CM

## 2021-08-30 ENCOUNTER — Other Ambulatory Visit: Payer: Self-pay | Admitting: Nurse Practitioner

## 2021-08-30 DIAGNOSIS — F411 Generalized anxiety disorder: Secondary | ICD-10-CM

## 2021-08-30 DIAGNOSIS — E782 Mixed hyperlipidemia: Secondary | ICD-10-CM

## 2021-08-31 ENCOUNTER — Encounter: Payer: Self-pay | Admitting: Nurse Practitioner

## 2021-08-31 ENCOUNTER — Ambulatory Visit (INDEPENDENT_AMBULATORY_CARE_PROVIDER_SITE_OTHER): Payer: Medicare HMO | Admitting: Nurse Practitioner

## 2021-08-31 VITALS — BP 135/74 | HR 60 | Temp 98.1°F | Resp 20 | Ht 62.0 in | Wt 271.0 lb

## 2021-08-31 DIAGNOSIS — R609 Edema, unspecified: Secondary | ICD-10-CM

## 2021-08-31 DIAGNOSIS — F411 Generalized anxiety disorder: Secondary | ICD-10-CM

## 2021-08-31 DIAGNOSIS — E782 Mixed hyperlipidemia: Secondary | ICD-10-CM | POA: Diagnosis not present

## 2021-08-31 DIAGNOSIS — I152 Hypertension secondary to endocrine disorders: Secondary | ICD-10-CM

## 2021-08-31 DIAGNOSIS — E119 Type 2 diabetes mellitus without complications: Secondary | ICD-10-CM | POA: Diagnosis not present

## 2021-08-31 DIAGNOSIS — I4819 Other persistent atrial fibrillation: Secondary | ICD-10-CM

## 2021-08-31 DIAGNOSIS — E1159 Type 2 diabetes mellitus with other circulatory complications: Secondary | ICD-10-CM | POA: Diagnosis not present

## 2021-08-31 LAB — BAYER DCA HB A1C WAIVED: HB A1C (BAYER DCA - WAIVED): 7.4 % — ABNORMAL HIGH (ref 4.8–5.6)

## 2021-08-31 MED ORDER — APIXABAN 5 MG PO TABS: 5.0000 mg | ORAL_TABLET | Freq: Two times a day (BID) | ORAL | 1 refills | Status: DC

## 2021-08-31 MED ORDER — CLONIDINE HCL 0.2 MG PO TABS: 0.2000 mg | ORAL_TABLET | Freq: Two times a day (BID) | ORAL | 1 refills | Status: DC

## 2021-08-31 MED ORDER — AMLODIPINE BESYLATE 5 MG PO TABS: 5.0000 mg | ORAL_TABLET | Freq: Every day | ORAL | 1 refills | Status: DC

## 2021-08-31 MED ORDER — METFORMIN HCL 1000 MG PO TABS: 1000.0000 mg | ORAL_TABLET | Freq: Two times a day (BID) | ORAL | 1 refills | Status: DC

## 2021-08-31 MED ORDER — SIMVASTATIN 40 MG PO TABS: 40.0000 mg | ORAL_TABLET | Freq: Every evening | ORAL | 1 refills | Status: DC

## 2021-08-31 MED ORDER — ALPRAZOLAM 0.25 MG PO TABS: 0.2500 mg | ORAL_TABLET | Freq: Every day | ORAL | 2 refills | Status: DC | PRN

## 2021-08-31 MED ORDER — GLIPIZIDE 5 MG PO TABS: 5.0000 mg | ORAL_TABLET | Freq: Two times a day (BID) | ORAL | 1 refills | Status: DC

## 2021-08-31 MED ORDER — QUINAPRIL HCL 40 MG PO TABS: 40.0000 mg | ORAL_TABLET | Freq: Every day | ORAL | 1 refills | Status: DC

## 2021-08-31 MED ORDER — FUROSEMIDE 20 MG PO TABS: 20.0000 mg | ORAL_TABLET | Freq: Every day | ORAL | 1 refills | Status: DC

## 2021-08-31 MED ORDER — METOPROLOL TARTRATE 50 MG PO TABS: 50.0000 mg | ORAL_TABLET | Freq: Two times a day (BID) | ORAL | 1 refills | Status: DC

## 2021-08-31 NOTE — Progress Notes (Addendum)
Subjective:    Patient ID: Ashley [name], female    DOB: December 16, 1950, 71 y.o.   MRN: [id number]   Chief Complaint: Medical Management of Chronic Issues (Stepped on glass a few days ago and foot in swollen and red)    HPI:  Ashley [name] is a 71 y.o. who identifies as a female who was assigned female at birth.   Social history: Lives with: by herself Work history: drives school bus   Comes in today for follow up of the following chronic medical issues:  1. Mixed hyperlipidemia Does not watch diet and does no dedicated exercise. Lab Results  Component Value Date   CHOL 135 03/03/2021   HDL 50 03/03/2021   LDLCALC 67 03/03/2021   TRIG 95 03/03/2021   CHOLHDL 2.7 03/03/2021     2. Type 2 diabetes mellitus without complication, without long-term current use of insulin (HCC) She very seldom checks her blood sugars Lab Results  Component Value Date   HGBA1C 6.6 (H) 03/03/2021    3. Hypertension associated with diabetes ([place]) No c/o chest pain, sob or headache. Does not check blood pressure at home. BP Readings from Last 3 Encounters:  08/31/21 135/74  06/16/21 (!) 165/80  06/08/21 (!) 167/71     4. GAD (generalized anxiety disorder) Is on xanax bid for anxiety has been on this for many years    08/31/2021   10:52 AM 06/16/2021    2:49 PM 03/03/2021   10:13 AM 11/30/2020   11:44 AM  GAD 7 : Generalized Anxiety Score  Nervous, Anxious, on Edge 0 '2 2 1  ' Control/stop worrying 0 2 2 0  Worry too much - different things 0 '2 2 1  ' Trouble relaxing 0 0 0 1  Restless 0 0 0 0  Easily annoyed or irritable 0 0 0 0  Afraid - awful might happen 0 2 2 0  Total GAD 7 Score 0 '8 8 3  ' Anxiety Difficulty Not difficult at all Not difficult at all Not difficult at all Not difficult at all      5. Persistent atrial fibrillation (HCC) No c/o palpitations or heart racing  6. Peripheral edema Has occasionally if she sits alot  7. Severe obesity (BMI >= 40) (HCC) Weight is up 9  lbs Wt Readings from Last 3 Encounters:  08/31/21 271 lb (122.9 kg)  07/19/21 262 lb (118.8 kg)  06/16/21 262 lb (118.8 kg)   BMI Readings from Last 3 Encounters:  08/31/21 49.57 kg/m  07/19/21 47.92 kg/m  06/16/21 47.92 kg/m      New complaints: Cut her right foot on piece of glass . Thinks she got glass out.   Allergies  Allergen Reactions   Ciprofloxacin Hcl Hives   Outpatient Encounter Medications as of 08/31/2021  Medication Sig   Accu-Chek Softclix Lancets lancets TEST BLOOD SUGAR TWICE DAILY Dx E11.9   acetaminophen (TYLENOL) 500 MG tablet Take 1,000 mg by mouth every 6 (six) hours as needed (for pain/headaches.).   albuterol (VENTOLIN HFA) 108 (90 Base) MCG/ACT inhaler INHALE 2 PUFFS INTO THE LUNGS EVERY 6 HOURS AS NEEDED FOR WHEEZE OR SHORTNESS OF BREATH   Alcohol Swabs (DROPSAFE ALCOHOL PREP) 70 % PADS CHECK BLOOD SUGAR TWICE DAILY   ALPRAZolam (XANAX) 0.25 MG tablet Take 1 tablet (0.25 mg total) by mouth daily as needed for anxiety.   amLODipine (NORVASC) 5 MG tablet TAKE 1 TABLET EVERY DAY   Blood Glucose Monitoring Suppl (ACCU-CHEK AVIVA PLUS) w/Device KIT USE AS  DIRECTED   Blood Glucose Monitoring Suppl (ACCU-CHEK GUIDE ME) w/Device KIT 1 kit by Does not apply route 2 (two) times daily.   CINNAMON PO Take 1 capsule by mouth daily.    cloNIDine (CATAPRES) 0.2 MG tablet TAKE 1 TABLET TWICE DAILY   clotrimazole-betamethasone (LOTRISONE) cream APPLY TO AFFECTED AREA TWICE A DAY   diclofenac Sodium (VOLTAREN) 1 % GEL Apply 4 g topically 4 (four) times daily.   ELIQUIS 5 MG TABS tablet TAKE 1 TABLET TWICE DAILY   furosemide (LASIX) 20 MG tablet TAKE 1 TABLET EVERY DAY   Ginger, Zingiber officinalis, (GINGER ROOT PO) Take 1 capsule by mouth daily.    glucose blood (ACCU-CHEK AVIVA PLUS) test strip TEST BLOOD SUGAR TWICE DAILY Dx E11.9   metFORMIN (GLUCOPHAGE) 1000 MG tablet Take 1 tablet (1,000 mg total) by mouth 2 (two) times daily with a meal.   metoprolol tartrate  (LOPRESSOR) 50 MG tablet TAKE 1 TABLET TWICE DAILY   nystatin cream (MYCOSTATIN) Apply 1 application topically 2 (two) times daily.   quinapril (ACCUPRIL) 40 MG tablet Take 1 tablet (40 mg total) by mouth daily.   simvastatin (ZOCOR) 40 MG tablet TAKE 1 TABLET EVERY EVENING   Turmeric Curcumin 500 MG CAPS Take 500 mg by mouth daily.    [DISCONTINUED] doxycycline (VIBRA-TABS) 100 MG tablet Take 1 tablet (100 mg total) by mouth 2 (two) times daily. 1 po bid   [DISCONTINUED] sulfamethoxazole-trimethoprim (BACTRIM DS) 800-160 MG tablet Take 1 tablet by mouth 2 (two) times daily.   glipiZIDE (GLUCOTROL) 5 MG tablet Take 1 tablet (5 mg total) by mouth 2 (two) times daily.   No facility-administered encounter medications on file as of 08/31/2021.    Past Surgical History:  Procedure Laterality Date   CYST EXCISION Left 03/15/2017   from buttocks   ORIF ANKLE FRACTURE Left 04/20/2017   Procedure: OPEN REDUCTION INTERNAL FIXATION (ORIF) LEFT ANKLE FRACTURE;  Surgeon: Netta Cedars, MD;  Location: [facility];  Service: Orthopedics;  Laterality: Left;    Family History  Problem Relation Age of Onset   Diabetes Mother    CAD Mother        CABG at age 47   Hypertension Mother    Hyperlipidemia Mother    Diabetes Father    Heart disease Father        CHF   Hypertension Father    Diabetes Sister    CAD Sister    Hypertension Sister    Diabetes Brother    Hypertension Brother    Healthy Sister       Controlled substance contract: 08/31/21 drug screen order today     Review of Systems  Constitutional:  Negative for diaphoresis.  Eyes:  Negative for pain.  Respiratory:  Negative for shortness of breath.   Cardiovascular:  Negative for chest pain, palpitations and leg swelling.  Gastrointestinal:  Negative for abdominal pain.  Endocrine: Negative for polydipsia.  Skin:  Negative for rash.  Neurological:  Negative for dizziness, weakness and headaches.  Hematological:  Does not  bruise/bleed easily.  All other systems reviewed and are negative.      Objective:   Physical Exam Vitals and nursing note reviewed.  Constitutional:      General: She is not in acute distress.    Appearance: Normal appearance. She is well-developed.  HENT:     Head: Normocephalic.     Right Ear: Tympanic membrane normal.     Left Ear: Tympanic membrane normal.  Nose: Nose normal.     Mouth/Throat:     Mouth: Mucous membranes are moist.  Eyes:     Pupils: Pupils are equal, round, and reactive to light.  Neck:     Vascular: No carotid bruit or JVD.  Cardiovascular:     Rate and Rhythm: Normal rate. Rhythm irregular.     Heart sounds: Murmur (3/6) heard.  Pulmonary:     Effort: Pulmonary effort is normal. No respiratory distress.     Breath sounds: Normal breath sounds. No wheezing or rales.  Chest:     Chest wall: No tenderness.  Abdominal:     General: Bowel sounds are normal. There is no distension or abdominal bruit.     Palpations: Abdomen is soft. There is no hepatomegaly, splenomegaly, mass or pulsatile mass.     Tenderness: There is no abdominal tenderness.  Musculoskeletal:        General: Normal range of motion.     Cervical back: Normal range of motion and neck supple.     Right lower leg: Edema (2+) present.     Left lower leg: Edema (2+) present.  Lymphadenopathy:     Cervical: No cervical adenopathy.  Skin:    General: Skin is warm and dry.     Comments: no erythema or reddness on bottom of right foot.  Neurological:     Mental Status: She is alert and oriented to person, place, and time.     Deep Tendon Reflexes: Reflexes are normal and symmetric.  Psychiatric:        Behavior: Behavior normal.        Thought Content: Thought content normal.        Judgment: Judgment normal.    BP 135/74   Pulse 60   Temp 98.1 F (36.7 C) (Temporal)   Resp 20   Ht '5\' 2"'  (1.575 m)   Wt 271 lb (122.9 kg)   SpO2 98%   BMI 49.57 kg/m   Hgba1c 7.4%       Assessment & Plan:   Ashley [name] comes in today with chief complaint of Medical Management of Chronic Issues (Stepped on glass a few days ago and foot in swollen and red)   Diagnosis and orders addressed:  1. Mixed hyperlipidemia Low fat diet - Lipid panel - simvastatin (ZOCOR) 40 MG tablet; Take 1 tablet (40 mg total) by mouth every evening.  Dispense: 90 tablet; Refill: 1  2. Type 2 diabetes mellitus without complication, without long-term current use of insulin (HCC) Continue to watch carbsi ndiet - Bayer DCA Hb A1c Waived - Microalbumin / creatinine urine ratio - glipiZIDE (GLUCOTROL) 5 MG tablet; Take 1 tablet (5 mg total) by mouth 2 (two) times daily.  Dispense: 180 tablet; Refill: 1 - metFORMIN (GLUCOPHAGE) 1000 MG tablet; Take 1 tablet (1,000 mg total) by mouth 2 (two) times daily with a meal.  Dispense: 180 tablet; Refill: 1  3. Hypertension associated with diabetes ([place]) Low sodium diet - CBC with Differential/Platelet - CMP14+EGFR - amLODipine (NORVASC) 5 MG tablet; Take 1 tablet (5 mg total) by mouth daily.  Dispense: 90 tablet; Refill: 1 - cloNIDine (CATAPRES) 0.2 MG tablet; Take 1 tablet (0.2 mg total) by mouth 2 (two) times daily.  Dispense: 180 tablet; Refill: 1 - quinapril (ACCUPRIL) 40 MG tablet; Take 1 tablet (40 mg total) by mouth daily.  Dispense: 90 tablet; Refill: 1  4. GAD (generalized anxiety disorder) Stress manageemnt - ToxASSURE Select 13 (MW), Urine - ALPRAZolam Duanne Moron)  0.25 MG tablet; Take 1 tablet (0.25 mg total) by mouth daily as needed for anxiety.  Dispense: 30 tablet; Refill: 2  5. Persistent atrial fibrillation (HCC) Avoid caffeine - metoprolol tartrate (LOPRESSOR) 50 MG tablet; Take 1 tablet (50 mg total) by mouth 2 (two) times daily.  Dispense: 180 tablet; Refill: 1 - apixaban (ELIQUIS) 5 MG TABS tablet; Take 1 tablet (5 mg total) by mouth 2 (two) times daily.  Dispense: 180 tablet; Refill: 1  6. Peripheral edema Elevate legs when  sitting - furosemide (LASIX) 20 MG tablet; Take 1 tablet (20 mg total) by mouth daily.  Dispense: 90 tablet; Refill: 1  7. Severe obesity (BMI >= 40) (HCC) Discussed diet and exercise for person with BMI >25 Will recheck weight in 3-6 months    Labs pending Health Maintenance reviewed Diet and exercise encouraged  Follow up plan: 6 months   [place], FNP

## 2021-08-31 NOTE — Patient Instructions (Signed)
Diabetes Mellitus and Foot Care Foot care is an important part of your health, especially when you have diabetes. Diabetes may cause you to have problems because of poor blood flow (circulation) to your feet and legs, which can cause your skin to: Become thinner and drier. Break more easily. Heal more slowly. Peel and crack. You may also have nerve damage (neuropathy) in your legs and feet, causing decreased feeling in them. This means that you may not notice minor injuries to your feet that could lead to more serious problems. Noticing and addressing any potential problems early is the best way to prevent future foot problems. How to care for your feet Foot hygiene  Wash your feet daily with warm water and mild soap. Do not use hot water. Then, pat your feet and the areas between your toes until they are completely dry. Do not soak your feet as this can dry your skin. Trim your toenails straight across. Do not dig under them or around the cuticle. File the edges of your nails with an emery board or nail file. Apply a moisturizing lotion or petroleum jelly to the skin on your feet and to dry, brittle toenails. Use lotion that does not contain alcohol and is unscented. Do not apply lotion between your toes. Shoes and socks Wear clean socks or stockings every day. Make sure they are not too tight. Do not wear knee-high stockings since they may decrease blood flow to your legs. Wear shoes that fit properly and have enough cushioning. Always look in your shoes before you put them on to be sure there are no objects inside. To break in new shoes, wear them for just a few hours a day. This prevents injuries on your feet. Wounds, scrapes, corns, and calluses  Check your feet daily for blisters, cuts, bruises, sores, and redness. If you cannot see the bottom of your feet, use a mirror or ask someone for help. Do not cut corns or calluses or try to remove them with medicine. If you find a minor scrape,  cut, or break in the skin on your feet, keep it and the skin around it clean and dry. You may clean these areas with mild soap and water. Do not clean the area with peroxide, alcohol, or iodine. If you have a wound, scrape, corn, or callus on your foot, look at it several times a day to make sure it is healing and not infected. Check for: Redness, swelling, or pain. Fluid or blood. Warmth. Pus or a bad smell. General tips Do not cross your legs. This may decrease blood flow to your feet. Do not use heating pads or hot water bottles on your feet. They may burn your skin. If you have lost feeling in your feet or legs, you may not know this is happening until it is too late. Protect your feet from hot and cold by wearing shoes, such as at the beach or on hot pavement. Schedule a complete foot exam at least once a year (annually) or more often if you have foot problems. Report any cuts, sores, or bruises to your health care provider immediately. Where to find more information American Diabetes Association: www.diabetes.org Association of Diabetes Care & Education Specialists: www.diabeteseducator.org Contact a health care provider if: You have a medical condition that increases your risk of infection and you have any cuts, sores, or bruises on your feet. You have an injury that is not healing. You have redness on your legs or feet. You   feel burning or tingling in your legs or feet. You have pain or cramps in your legs and feet. Your legs or feet are numb. Your feet always feel cold. You have pain around any toenails. Get help right away if: You have a wound, scrape, corn, or callus on your foot and: You have pain, swelling, or redness that gets worse. You have fluid or blood coming from the wound, scrape, corn, or callus. Your wound, scrape, corn, or callus feels warm to the touch. You have pus or a bad smell coming from the wound, scrape, corn, or callus. You have a fever. You have a red  line going up your leg. Summary Check your feet every day for blisters, cuts, bruises, sores, and redness. Apply a moisturizing lotion or petroleum jelly to the skin on your feet and to dry, brittle toenails. Wear shoes that fit properly and have enough cushioning. If you have foot problems, report any cuts, sores, or bruises to your health care provider immediately. Schedule a complete foot exam at least once a year (annually) or more often if you have foot problems. This information is not intended to replace advice given to you by your health care provider. Make sure you discuss any questions you have with your health care provider. Document Revised: 08/15/2019 Document Reviewed: 08/15/2019 Elsevier Patient Education  2023 Elsevier Inc.  

## 2021-09-01 LAB — CMP14+EGFR
ALT: 14 IU/L (ref 0–32)
AST: 27 IU/L (ref 0–40)
Albumin/Globulin Ratio: 1.5 (ref 1.2–2.2)
Albumin: 4 g/dL (ref 3.9–4.9)
Alkaline Phosphatase: 52 IU/L (ref 44–121)
BUN/Creatinine Ratio: 23 (ref 12–28)
BUN: 12 mg/dL (ref 8–27)
Bilirubin Total: 0.8 mg/dL (ref 0.0–1.2)
CO2: 21 mmol/L (ref 20–29)
Calcium: 9.2 mg/dL (ref 8.7–10.3)
Chloride: 103 mmol/L (ref 96–106)
Creatinine, Ser: 0.53 mg/dL — ABNORMAL LOW (ref 0.57–1.00)
Globulin, Total: 2.6 g/dL (ref 1.5–4.5)
Glucose: 159 mg/dL — ABNORMAL HIGH (ref 70–99)
Potassium: 4.1 mmol/L (ref 3.5–5.2)
Sodium: 141 mmol/L (ref 134–144)
Total Protein: 6.6 g/dL (ref 6.0–8.5)
eGFR: 100 mL/min/{1.73_m2} (ref >59)

## 2021-09-01 LAB — CBC WITH DIFFERENTIAL/PLATELET
Basophils Absolute: 0 10*3/uL (ref 0.0–0.2)
Basos: 1 %
EOS (ABSOLUTE): 0.1 10*3/uL (ref 0.0–0.4)
Eos: 2 %
Hematocrit: 37.8 % (ref 34.0–46.6)
Hemoglobin: 12.7 g/dL (ref 11.1–15.9)
Immature Grans (Abs): 0 10*3/uL (ref 0.0–0.1)
Immature Granulocytes: 0 %
Lymphocytes Absolute: 1.6 10*3/uL (ref 0.7–3.1)
Lymphs: 25 %
MCH: 31.3 pg (ref 26.6–33.0)
MCHC: 33.6 g/dL (ref 31.5–35.7)
MCV: 93 fL (ref 79–97)
Monocytes Absolute: 0.5 10*3/uL (ref 0.1–0.9)
Monocytes: 8 %
Neutrophils Absolute: 4.2 10*3/uL (ref 1.4–7.0)
Neutrophils: 64 %
Platelets: 183 10*3/uL (ref 150–450)
RBC: 4.06 x10E6/uL (ref 3.77–5.28)
RDW: 13.3 % (ref 11.7–15.4)
WBC: 6.5 10*3/uL (ref 3.4–10.8)

## 2021-09-01 LAB — LIPID PANEL
Chol/HDL Ratio: 2.8 ratio (ref 0.0–4.4)
Cholesterol, Total: 108 mg/dL (ref 100–199)
HDL: 38 mg/dL — ABNORMAL LOW (ref >39)
LDL Chol Calc (NIH): 46 mg/dL (ref 0–99)
Triglycerides: 137 mg/dL (ref 0–149)
VLDL Cholesterol Cal: 24 mg/dL (ref 5–40)

## 2021-09-01 LAB — MICROALBUMIN / CREATININE URINE RATIO
Creatinine, Urine: 272 mg/dL
Microalb/Creat Ratio: 7 mg/g creat (ref 0–29)
Microalbumin, Urine: 20.3 ug/mL

## 2021-09-03 ENCOUNTER — Telehealth: Payer: Self-pay

## 2021-09-03 MED ORDER — LISINOPRIL 40 MG PO TABS: 40.0000 mg | ORAL_TABLET | Freq: Every day | ORAL | 1 refills | Status: DC

## 2021-09-03 NOTE — Telephone Encounter (Signed)
Quinapril changed to lisinopril due to insurance

## 2021-09-03 NOTE — Telephone Encounter (Signed)
Patient said her insurance contacted her and said they no longer have quinapril and need it changed to something else. They recommended Lisinopril, Enalapril, or Benazepril. Please review and advise

## 2021-09-03 NOTE — Telephone Encounter (Signed)
Patient notified

## 2021-09-08 ENCOUNTER — Telehealth: Payer: Self-pay | Admitting: Nurse Practitioner

## 2021-09-08 MED ORDER — LISINOPRIL 40 MG PO TABS: 40.0000 mg | ORAL_TABLET | Freq: Every day | ORAL | 1 refills | Status: DC

## 2021-09-08 NOTE — Telephone Encounter (Signed)
  Prescription Request  09/08/2021  Is this a "Controlled Substance" medicine? no  Have you seen your PCP in the last 2 weeks? Yes, 7/25   If YES, route message to pool  -  If NO, patient needs to be scheduled for appointment.  What is the name of the medication or equipment?  lisinopril (ZESTRIL) 40 MG tablet  Have you contacted your pharmacy to request a refill? Yes, sent to wrong pharmacy per pt    Which pharmacy would you like this sent to? [facility], [place]   Patient notified that their request is being sent to the clinical staff for review and that they should receive a response within 2 business days.

## 2021-09-08 NOTE — Telephone Encounter (Signed)
Pt aware medication sent to [facility]

## 2021-09-10 DIAGNOSIS — H5213 Myopia, bilateral: Secondary | ICD-10-CM | POA: Diagnosis not present

## 2021-09-10 LAB — HM DIABETES EYE EXAM

## 2021-09-27 LAB — HM DIABETES EYE EXAM

## 2021-10-04 DIAGNOSIS — D239 Other benign neoplasm of skin, unspecified: Secondary | ICD-10-CM | POA: Diagnosis not present

## 2021-10-04 DIAGNOSIS — Z1283 Encounter for screening for malignant neoplasm of skin: Secondary | ICD-10-CM | POA: Diagnosis not present

## 2021-10-04 DIAGNOSIS — L570 Actinic keratosis: Secondary | ICD-10-CM | POA: Diagnosis not present

## 2021-10-08 ENCOUNTER — Encounter: Payer: Self-pay | Admitting: Family

## 2021-10-08 ENCOUNTER — Ambulatory Visit (INDEPENDENT_AMBULATORY_CARE_PROVIDER_SITE_OTHER): Payer: Medicare HMO | Admitting: Family

## 2021-10-08 DIAGNOSIS — L089 Local infection of the skin and subcutaneous tissue, unspecified: Secondary | ICD-10-CM | POA: Diagnosis not present

## 2021-10-08 DIAGNOSIS — E1169 Type 2 diabetes mellitus with other specified complication: Secondary | ICD-10-CM | POA: Diagnosis not present

## 2021-10-08 DIAGNOSIS — Z794 Long term (current) use of insulin: Secondary | ICD-10-CM

## 2021-10-08 DIAGNOSIS — Z6841 Body Mass Index (BMI) 40.0 and over, adult: Secondary | ICD-10-CM

## 2021-10-08 MED ORDER — DOXYCYCLINE HYCLATE 100 MG PO TABS: 100.0000 mg | ORAL_TABLET | Freq: Two times a day (BID) | ORAL | 0 refills | Status: DC

## 2021-10-08 NOTE — Progress Notes (Signed)
Virtual Visit  Note Due to COVID-19 pandemic this visit was conducted virtually. This visit type was conducted due to national recommendations for restrictions regarding the COVID-19 Pandemic (e.g. social distancing, sheltering in place) in an effort to limit this patient's exposure and mitigate transmission in our community. All issues noted in this document were discussed and addressed.  A physical exam was not performed with this format.  I connected with Dyane Broberg on 10/08/21 at 12:45 pm by telephone and verified that I am speaking with the correct person using two identifiers. Eryca Bolte is currently located at home and no one is currently with her during visit. The provider, Evelina Dun, FNP is located in their office at time of visit.  I discussed the limitations, risks, security and privacy concerns of performing an evaluation and management service by telephone and the availability of in person appointments. I also discussed with the patient that there may be a patient responsible charge related to this service. The patient expressed understanding and agreed to proceed.  Ms. jeanelle, dake are scheduled for a virtual visit with your provider today.    Just as we do with appointments in the office, we must obtain your consent to participate.  Your consent will be active for this visit and any virtual visit you may have with one of our providers in the next 365 days.    If you have a MyChart account, I can also send a copy of this consent to you electronically.  All virtual visits are billed to your insurance company just like a traditional visit in the office.  As this is a virtual visit, video technology does not allow for your provider to perform a traditional examination.  This may limit your provider's ability to fully assess your condition.  If your provider identifies any concerns that need to be evaluated in person or the need to arrange testing such as labs, EKG, etc, we will make  arrangements to do so.    Although advances in technology are sophisticated, we cannot ensure that it will always work on either your end or our end.  If the connection with a video visit is poor, we may have to switch to a telephone visit.  With either a video or telephone visit, we are not always able to ensure that we have a secure connection.   I need to obtain your verbal consent now.   Are you willing to proceed with your visit today?   Donise Woodle has provided verbal consent on 10/08/2021 for a virtual visit (video or telephone).   Evelina Dun,  10/08/2021  12:47 PM    History and Present Illness:  HPI PT calls the office today left lower leg erythemas. States she saw her dermatologists Monday and had a skin lesion "frozen" off. Noticed last night the area was slightly red, however, this morning it is worse. Denies any pain, fever. Reports mild swelling and warmth.   She is a diabetic and her last A1C was 7.4.   Review of Systems  Skin:        wound  All other systems reviewed and are negative.    Observations/Objective: No SOB or distress noted   Assessment and Plan: 1. Infected skin lesion Start doxycyline  Keep clean and dry Report any increased redness, fevers, discharge, or swelling Continue good control of glucose - doxycycline (VIBRA-TABS) 100 MG tablet; Take 1 tablet (100 mg total) by mouth 2 (two) times daily.  Dispense: 20 tablet; Refill:  0  2. Type 2 diabetes mellitus with other specified complication, without long-term current use of insulin ([place])  3. Severe obesity (BMI >= 40) (HCC)    I discussed the assessment and treatment plan with the patient. The patient was provided an opportunity to ask questions and all were answered. The patient agreed with the plan and demonstrated an understanding of the instructions.   The patient was advised to call back or seek an in-person evaluation if the symptoms worsen or if the condition fails to improve as  anticipated.  The above assessment and management plan was discussed with the patient. The patient verbalized understanding of and has agreed to the management plan. Patient is aware to call the clinic if symptoms persist or worsen. Patient is aware when to return to the clinic for a follow-up visit. Patient educated on when it is appropriate to go to the emergency department.   Time call ended:  12:56 pm   I provided 11 minutes of  non face-to-face time during this encounter.    Evelina Dun, FNP

## 2021-10-19 ENCOUNTER — Ambulatory Visit: Payer: Medicare HMO | Admitting: Cardiology

## 2021-10-19 NOTE — Progress Notes (Deleted)
Cardiology Office Note:    Date:  10/19/2021   ID:  Ashley [name], DOB 1951/01/21, MRN [id number]  PCP:  Chevis Pretty, FNP  Cardiologist:  Carlyle Dolly, MD  Electrophysiologist:  None   Referring MD: Hassell Done, Mary-Margaret, *   No chief complaint on file. ***  History of Present Illness:    Ashley [name] is a 71 y.o. female with a hx of atrial fibrillation, hypertension, hyperlipidemia, T2DM who is referred by Chevis Pretty, NP for evaluation of atrial fibrillation.  She previously followed with Dr. Harl Bowie, last seen 10/2018.  Past Medical History:  Diagnosis Date   Depression    Diabetes mellitus without complication ([place])    Hyperlipidemia    Hypertension     Past Surgical History:  Procedure Laterality Date   CYST EXCISION Left 03/15/2017   from buttocks   ORIF ANKLE FRACTURE Left 04/20/2017   Procedure: OPEN REDUCTION INTERNAL FIXATION (ORIF) LEFT ANKLE FRACTURE;  Surgeon: Netta Cedars, MD;  Location: [facility];  Service: Orthopedics;  Laterality: Left;    Current Medications: No outpatient medications have been marked as taking for the 10/19/21 encounter (Appointment) with Donato Heinz, MD.     Allergies:   Ciprofloxacin hcl   Social History   Socioeconomic History   Marital status: Widowed    Spouse name: Not on file   Number of children: 2   Years of education: 12   Highest education level: High school graduate  Occupational History   Occupation: School bus Education administrator: [employer]  Tobacco Use   Smoking status: Never   Smokeless tobacco: Never  Vaping Use   Vaping Use: Never used  Substance and Sexual Activity   Alcohol use: No   Drug use: No   Sexual activity: Not Currently  Other Topics Concern   Not on file  Social History Narrative   Lives alone. Driving bus part time. Daughter lives across the street.   Enjoys gardening and quilting. Physical activity limited due to ankle pain since fx in 2019   Social  Determinants of Health   Financial Resource Strain: Medium Risk (07/19/2021)   Overall Financial Resource Strain (CARDIA)    Difficulty of Paying Living Expenses: Somewhat hard  Food Insecurity: No Food Insecurity (07/19/2021)   Hunger Vital Sign    Worried About Running Out of Food in the Last Year: Never true    Ran Out of Food in the Last Year: Never true  Transportation Needs: No Transportation Needs (07/19/2021)   PRAPARE - Hydrologist (Medical): No    Lack of Transportation (Non-Medical): No  Physical Activity: Insufficiently Active (07/19/2021)   Exercise Vital Sign    Days of Exercise per Week: 3 days    Minutes of Exercise per Session: 30 min  Stress: No Stress Concern Present (07/19/2021)   [place]    Feeling of Stress : Not at all  Social Connections: [place] (07/19/2021)   Social Connection and Isolation Panel [NHANES]    Frequency of Communication with Friends and Family: More than three times a week    Frequency of Social Gatherings with Friends and Family: More than three times a week    Attends Religious Services: More than 4 times per year    Active Member of [employer] or Organizations: Yes    Attends Music therapist: More than 4 times per year    Marital Status: Married  Family History: The patient's ***family history includes CAD in her mother and sister; Diabetes in her brother, father, mother, and sister; Healthy in her sister; Heart disease in her father; Hyperlipidemia in her mother; Hypertension in her brother, father, mother, and sister.  ROS:   Please see the history of present illness.    *** All other systems reviewed and are negative.  EKGs/Labs/Other Studies Reviewed:    The following studies were reviewed today: ***  EKG:  EKG is *** ordered today.  The ekg ordered today demonstrates ***  Recent Labs: 08/31/2021: ALT 14; BUN  12; Creatinine, Ser 0.53; Hemoglobin 12.7; Platelets 183; Potassium 4.1; Sodium 141  Recent Lipid Panel    Component Value Date/Time   CHOL 108 08/31/2021 1036   TRIG 137 08/31/2021 1036   TRIG 118 11/29/2013 0907   HDL 38 (L) 08/31/2021 1036   HDL 45 11/29/2013 0907   CHOLHDL 2.8 08/31/2021 1036   LDLCALC 46 08/31/2021 1036   LDLCALC 66 11/29/2013 0907    Physical Exam:    VS:  There were no vitals taken for this visit.    Wt Readings from Last 3 Encounters:  08/31/21 271 lb (122.9 kg)  07/19/21 262 lb (118.8 kg)  06/16/21 262 lb (118.8 kg)     GEN: *** Well nourished, well developed in no acute distress HEENT: Normal NECK: No JVD; No carotid bruits LYMPHATICS: No lymphadenopathy CARDIAC: ***RRR, no murmurs, rubs, gallops RESPIRATORY:  Clear to auscultation without rales, wheezing or rhonchi  ABDOMEN: Soft, non-tender, non-distended MUSCULOSKELETAL:  No edema; No deformity  SKIN: Warm and dry NEUROLOGIC:  Alert and oriented x 3 PSYCHIATRIC:  Normal affect   ASSESSMENT:    No diagnosis found. PLAN:    Atrial fibrillation: CHA2DS2-VASc score 4 (hypertension, age, T2DM, female).  Echocardiogram 04/20/2017 showed normal biventricular function, no significant valvular disease. -Continue Eliquis 5 mg twice daily  Hypertension: On metoprolol 50 mg twice daily, lisinopril 40 mg daily, Lasix 20 mg daily, amlodipine 5 mg daily  Hyperlipidemia: On simvastatin 40 mg daily  T2DM: On glipizide and metformin  RTC in***   Medication Adjustments/Labs and Tests Ordered: Current medicines are reviewed at length with the patient today.  Concerns regarding medicines are outlined above.  No orders of the defined types were placed in this encounter.  No orders of the defined types were placed in this encounter.   There are no Patient Instructions on file for this visit.   Signed, Donato Heinz, MD  10/19/2021 9:58 AM    [place]

## 2021-11-28 NOTE — Progress Notes (Unsigned)
Cardiology Office Note  Date: 11/29/2021   ID: Ashley [name], DOB Mar 11, 1950, MRN [id number]  PCP:  Bennie Pierini, FNP  Cardiologist:  Marjo Bicker, MD Electrophysiologist:  None   Reason for Office Visit: chest pain request of [name], FNP   History of Present Illness: Ashley [name] is a 71 y.o. female known to have A-fib, HTN, DM 2, HLD was referred to the cardiology clinic at the request of [name], FNP for evaluation of chest pain. Patient reported substernal chest tightness/discomfort occurring with strenuous exertional activities (doing yard work in a hot summer), lasting for a few minutes before it resolved with rest. Frequency is once in 2 to 3 months.  Denied any DOE, dizziness, lightheadedness, syncope, LE swelling, claudication, palpitations.  Denies smoking cigarettes, illicit drug use, alcohol use.  Compliant with medications and no bleeding complications.  Patient stated that she had to pay $150 co-pay every month to purchase Eliquis and she did not want to switch to Coumadin due to frequent INR checks. Currently does not have any risk of falls.  Past Medical History:  Diagnosis Date   Depression    Diabetes mellitus without complication (H[facility])    Hyperlipidemia    Hypertension     Past Surgical History:  Procedure Laterality Date   CYST EXCISION Left 03/15/2017   from buttocks   ORIF ANKLE FRACTURE Left 04/20/2017   Procedure: OPEN REDUCTION INTERNAL FIXATION (ORIF) LEFT ANKLE FRACTURE;  Surgeon: Beverely Low, MD;  Location: [facility] OR;  Service: Orthopedics;  Laterality: Left;    Current Outpatient Medications  Medication Sig Dispense Refill   Accu-Chek Softclix Lancets lancets TEST BLOOD SUGAR TWICE DAILY Dx E11.9 200 each 3   acetaminophen (TYLENOL) 500 MG tablet Take 1,000 mg by mouth every 6 (six) hours as needed (for pain/headaches.).     albuterol (VENTOLIN HFA) 108 (90 Base) MCG/ACT inhaler INHALE 2 PUFFS INTO THE LUNGS EVERY 6 HOURS AS NEEDED FOR  WHEEZE OR SHORTNESS OF BREATH 6.7 each 1   Alcohol Swabs (DROPSAFE ALCOHOL PREP) 70 % PADS CHECK BLOOD SUGAR TWICE DAILY 200 each 3   ALPRAZolam (XANAX) 0.25 MG tablet Take 1 tablet (0.25 mg total) by mouth daily as needed for anxiety. 30 tablet 2   amLODipine (NORVASC) 5 MG tablet Take 1 tablet (5 mg total) by mouth daily. 90 tablet 1   Ascorbic Acid (VITAMIN C) 1000 MG tablet Take 1,000 mg by mouth daily.     Blood Glucose Monitoring Suppl (ACCU-CHEK AVIVA PLUS) w/Device KIT USE AS DIRECTED 1 kit 0   Blood Glucose Monitoring Suppl (ACCU-CHEK GUIDE ME) w/Device KIT 1 kit by Does not apply route 2 (two) times daily. 1 kit 0   Cholecalciferol (VITAMIN D3) 1000 units CAPS Take 1 capsule by mouth daily.     CINNAMON PO Take 1 capsule by mouth daily.      cloNIDine (CATAPRES) 0.2 MG tablet Take 1 tablet (0.2 mg total) by mouth 2 (two) times daily. 180 tablet 1   furosemide (LASIX) 20 MG tablet Take 1 tablet (20 mg total) by mouth daily. 90 tablet 1   Ginger, Zingiber officinalis, (GINGER ROOT PO) Take 1 capsule by mouth daily.      glipiZIDE (GLUCOTROL) 5 MG tablet Take 1 tablet (5 mg total) by mouth 2 (two) times daily. 180 tablet 1   glucose blood (ACCU-CHEK AVIVA PLUS) test strip TEST BLOOD SUGAR TWICE DAILY Dx E11.9 200 strip 3   lisinopril (ZESTRIL) 40 MG tablet Take  1 tablet (40 mg total) by mouth daily. 90 tablet 1   metFORMIN (GLUCOPHAGE) 1000 MG tablet Take 1 tablet (1,000 mg total) by mouth 2 (two) times daily with a meal. 180 tablet 1   metoprolol tartrate (LOPRESSOR) 50 MG tablet Take 1 tablet (50 mg total) by mouth 2 (two) times daily. (Patient taking differently: Take 25 mg by mouth 2 (two) times daily.) 180 tablet 1   Multiple Vitamins-Minerals (ZINC PO) Take 1 tablet by mouth daily.     nitroGLYCERIN (NITROSTAT) 0.4 MG SL tablet Place 1 tablet (0.4 mg total) under the tongue every 5 (five) minutes x 3 doses as needed for chest pain. 90 tablet 3   Omega-3 Fatty Acids (FISH OIL) 1000 MG  CAPS Take 1 capsule by mouth daily.     simvastatin (ZOCOR) 40 MG tablet Take 1 tablet (40 mg total) by mouth every evening. 90 tablet 1   Turmeric Curcumin 500 MG CAPS Take 500 mg by mouth daily.      apixaban (ELIQUIS) 5 MG TABS tablet Take 1 tablet (5 mg total) by mouth 2 (two) times daily. 14 tablet 0   No current facility-administered medications for this visit.   Allergies:  Ciprofloxacin hcl   Social History: The patient  reports that she has never smoked. She has never used smokeless tobacco. She reports that she does not drink alcohol and does not use drugs.   Family History: The patient's family history includes CAD in her mother and sister; Diabetes in her brother, father, mother, and sister; Healthy in her sister; Heart disease in her father; Hyperlipidemia in her mother; Hypertension in her brother, father, mother, and sister.   ROS:  Please see the history of present illness. Otherwise, complete review of systems is positive for none.  All other systems are reviewed and negative.   Physical Exam: VS:  BP 120/68   Pulse 73   Ht 5\' 2"  (1.575 m)   Wt 259 lb 6.4 oz (117.7 kg)   SpO2 97%   BMI 47.44 kg/m , BMI Body mass index is 47.44 kg/m.  Wt Readings from Last 3 Encounters:  11/29/21 259 lb 6.4 oz (117.7 kg)  08/31/21 271 lb (122.9 kg)  07/19/21 262 lb (118.8 kg)    General: Patient appears comfortable at rest. HEENT: Conjunctiva and lids normal, oropharynx clear with moist mucosa. Neck: Supple, no elevated JVP or carotid bruits, no thyromegaly. Lungs: Clear to auscultation, nonlabored breathing at rest. Cardiac: Regular rate and rhythm, no S3 but has significant systolic murmur, no pericardial rub. Abdomen: Soft, nontender, no hepatomegaly, bowel sounds present, no guarding or rebound. Extremities: No pitting edema, distal pulses 2+. Skin: Warm and dry. Musculoskeletal: No kyphosis. Neuropsychiatric: Alert and oriented x3, affect grossly appropriate.  ECG:  An  ECG dated 11/29/21 was personally reviewed today and demonstrated:  AFib  Recent Labwork: 08/31/2021: ALT 14; AST 27; BUN 12; Creatinine, Ser 0.53; Hemoglobin 12.7; Platelets 183; Potassium 4.1; Sodium 141     Component Value Date/Time   CHOL 108 08/31/2021 1036   TRIG 137 08/31/2021 1036   TRIG 118 11/29/2013 0907   HDL 38 (L) 08/31/2021 1036   HDL 45 11/29/2013 0907   CHOLHDL 2.8 08/31/2021 1036   LDLCALC 46 08/31/2021 1036   LDLCALC 66 11/29/2013 0907    Other Studies Reviewed Today: Echo in 2019 LVEF 55 to 60% No valvular abnormalities   NM stress test in 2003 No inducible ischemia.  Normal LVEF  Assessment and Plan: Patient  is a 71 year old F known to have HTN, A-fib, DM 2, HLD presented to the cardiology clinic for new patient visit for evaluation of chest pain at the request of [name], FNP.  #Cardiac chest pain Plan -Patient has been having substernal chest tightness lasting for few minutes, occurring with exertion resolving with rest. Frequency is once in 2 to 3 months. Due to infrequent nature of the chest pains, will prescribe SL NTG 0.4 mg as needed.  ER precautions for chest pain provided. If her chest pain frequency or duration worsens, she will need cardiac noninvasive testing for CAD screening.  #Systolic murmur, Grade 3/6 Plan -Obtain 2D Echo  #Persistent A-fib, asymptomatic Plan -Continue metoprolol tartrate 25 mg twice a day -Continue Eliquis 5 mg twice a day.  No risk of falls.  No history of stroke.  #HLD, currently at goal Plan -Continue simvastatin 40 mg nightly.  LDL in 40s.  No side effects.  #HTN, controlled Plan -Continue amlodipine 5 mg once daily, lisinopril 40 mg once daily, Lasix 20 mg once daily -Clonidine per PCP  I have spent a total of 45 minutes with patient reviewing chart, EKGs, labs and examining patient as well as establishing an assessment and plan that was discussed with the patient.  > 50% of time was spent in direct patient  care.    Medication Adjustments/Labs and Tests Ordered: Current medicines are reviewed at length with the patient today.  Concerns regarding medicines are outlined above.   Tests Ordered: Orders Placed This Encounter  Procedures   EKG 12-Lead   ECHOCARDIOGRAM COMPLETE    Medication Changes: Meds ordered this encounter  Medications   nitroGLYCERIN (NITROSTAT) 0.4 MG SL tablet    Sig: Place 1 tablet (0.4 mg total) under the tongue every 5 (five) minutes x 3 doses as needed for chest pain.    Dispense:  90 tablet    Refill:  3    11/29/21 New Start   apixaban (ELIQUIS) 5 MG TABS tablet    Sig: Take 1 tablet (5 mg total) by mouth 2 (two) times daily.    Dispense:  14 tablet    Refill:  0    Lot # [id number] Exp 05/2023    Disposition:  Follow up  2 months  Signed Collin Rengel Verne Spurr, MD, 11/29/2021 12:48 PM    [facility] [address] [place], [place], [place] [address]

## 2021-11-29 ENCOUNTER — Ambulatory Visit: Payer: Medicare HMO | Attending: Cardiology | Admitting: Internal Medicine

## 2021-11-29 ENCOUNTER — Encounter: Payer: Self-pay | Admitting: Internal Medicine

## 2021-11-29 VITALS — BP 120/68 | HR 73 | Ht 62.0 in | Wt 259.4 lb

## 2021-11-29 DIAGNOSIS — R079 Chest pain, unspecified: Secondary | ICD-10-CM | POA: Diagnosis not present

## 2021-11-29 DIAGNOSIS — R011 Cardiac murmur, unspecified: Secondary | ICD-10-CM | POA: Insufficient documentation

## 2021-11-29 DIAGNOSIS — I4819 Other persistent atrial fibrillation: Secondary | ICD-10-CM | POA: Diagnosis not present

## 2021-11-29 MED ORDER — NITROGLYCERIN 0.4 MG SL SUBL: 0.4000 mg | SUBLINGUAL_TABLET | SUBLINGUAL | 3 refills | Status: DC | PRN

## 2021-11-29 MED ORDER — APIXABAN 5 MG PO TABS: 5.0000 mg | ORAL_TABLET | Freq: Two times a day (BID) | ORAL | 0 refills | Status: DC

## 2021-11-29 NOTE — Patient Instructions (Signed)
Medication Instructions:  Your physician has recommended you make the following change in your medication:  Start nitroglycerin as needed for chest pain Continue all other medications as directed  Labwork: none  Testing/Procedures: Your physician has requested that you have an echocardiogram. Echocardiography is a painless test that uses sound waves to create images of your heart. It provides your doctor with information about the size and shape of your heart and how well your heart's chambers and valves are working. This procedure takes approximately one hour. There are no restrictions for this procedure. Please do NOT wear cologne, perfume, aftershave, or lotions (deodorant is allowed). Please arrive 15 minutes prior to your appointment time.   Follow-Up:  Your physician recommends that you schedule a follow-up appointment in: 3 months  Any Other Special Instructions Will Be Listed Below (If Applicable).  If you need a refill on your cardiac medications before your next appointment, please call your pharmacy.

## 2021-12-01 ENCOUNTER — Other Ambulatory Visit: Payer: Self-pay | Admitting: *Deleted

## 2021-12-01 MED ORDER — ACCU-CHEK GUIDE ME W/DEVICE KIT: PACK | 0 refills | Status: DC

## 2021-12-02 ENCOUNTER — Ambulatory Visit: Payer: Medicare HMO | Admitting: Nurse Practitioner

## 2021-12-06 ENCOUNTER — Encounter: Payer: Self-pay | Admitting: Nurse Practitioner

## 2021-12-06 ENCOUNTER — Ambulatory Visit (INDEPENDENT_AMBULATORY_CARE_PROVIDER_SITE_OTHER): Payer: Medicare HMO | Admitting: Nurse Practitioner

## 2021-12-06 VITALS — BP 117/68 | HR 71 | Temp 97.8°F | Resp 20 | Ht 62.0 in | Wt 256.0 lb

## 2021-12-06 DIAGNOSIS — E1159 Type 2 diabetes mellitus with other circulatory complications: Secondary | ICD-10-CM

## 2021-12-06 DIAGNOSIS — E1169 Type 2 diabetes mellitus with other specified complication: Secondary | ICD-10-CM | POA: Diagnosis not present

## 2021-12-06 DIAGNOSIS — Z1159 Encounter for screening for other viral diseases: Secondary | ICD-10-CM | POA: Diagnosis not present

## 2021-12-06 DIAGNOSIS — E782 Mixed hyperlipidemia: Secondary | ICD-10-CM | POA: Diagnosis not present

## 2021-12-06 DIAGNOSIS — I10 Essential (primary) hypertension: Secondary | ICD-10-CM | POA: Diagnosis not present

## 2021-12-06 DIAGNOSIS — R609 Edema, unspecified: Secondary | ICD-10-CM

## 2021-12-06 DIAGNOSIS — I4819 Other persistent atrial fibrillation: Secondary | ICD-10-CM

## 2021-12-06 DIAGNOSIS — E119 Type 2 diabetes mellitus without complications: Secondary | ICD-10-CM

## 2021-12-06 DIAGNOSIS — F411 Generalized anxiety disorder: Secondary | ICD-10-CM

## 2021-12-06 DIAGNOSIS — I152 Hypertension secondary to endocrine disorders: Secondary | ICD-10-CM

## 2021-12-06 LAB — BAYER DCA HB A1C WAIVED: HB A1C (BAYER DCA - WAIVED): 7 % — ABNORMAL HIGH (ref 4.8–5.6)

## 2021-12-06 MED ORDER — METOPROLOL TARTRATE 50 MG PO TABS: 50.0000 mg | ORAL_TABLET | Freq: Two times a day (BID) | ORAL | 1 refills | Status: DC

## 2021-12-06 MED ORDER — ALPRAZOLAM 0.25 MG PO TABS: 0.2500 mg | ORAL_TABLET | Freq: Every day | ORAL | 2 refills | Status: DC | PRN

## 2021-12-06 MED ORDER — LISINOPRIL 40 MG PO TABS: 40.0000 mg | ORAL_TABLET | Freq: Every day | ORAL | 1 refills | Status: DC

## 2021-12-06 MED ORDER — FUROSEMIDE 20 MG PO TABS: 20.0000 mg | ORAL_TABLET | Freq: Every day | ORAL | 1 refills | Status: DC

## 2021-12-06 MED ORDER — CLONIDINE HCL 0.2 MG PO TABS: 0.2000 mg | ORAL_TABLET | Freq: Two times a day (BID) | ORAL | 1 refills | Status: DC

## 2021-12-06 MED ORDER — AMLODIPINE BESYLATE 5 MG PO TABS: 5.0000 mg | ORAL_TABLET | Freq: Every day | ORAL | 1 refills | Status: DC

## 2021-12-06 MED ORDER — GLIPIZIDE 5 MG PO TABS: 5.0000 mg | ORAL_TABLET | Freq: Two times a day (BID) | ORAL | 1 refills | Status: DC

## 2021-12-06 MED ORDER — SIMVASTATIN 40 MG PO TABS: 40.0000 mg | ORAL_TABLET | Freq: Every evening | ORAL | 1 refills | Status: DC

## 2021-12-06 MED ORDER — METFORMIN HCL 1000 MG PO TABS: 1000.0000 mg | ORAL_TABLET | Freq: Two times a day (BID) | ORAL | 1 refills | Status: DC

## 2021-12-06 MED ORDER — APIXABAN 5 MG PO TABS: 5.0000 mg | ORAL_TABLET | Freq: Two times a day (BID) | ORAL | 5 refills | Status: DC

## 2021-12-06 NOTE — Progress Notes (Signed)
Subjective:    Patient ID: Ashley [name], female    DOB: 03/25/1950, 71 y.o.   MRN: [id number]   Chief Complaint: medical management of chronic issues     HPI:  Ashley [name] is a 71 y.o. who identifies as a female who was assigned female at birth.   Social history: Lives with: by herself- her daughter checks on her daily Work history: drives a school bus   Comes in today for follow up of the following chronic medical issues:  1. Primary hypertension No c/o chest pain, sob or headache. Doe snot check blood pressure at home. BP Readings from Last 3 Encounters:  11/29/21 120/68  08/31/21 135/74  06/16/21 (!) 165/80      2. Mixed hyperlipidemia Does not really watch diet and does little  to no exercise. Lab Results  Component Value Date   CHOL 108 08/31/2021   HDL 38 (L) 08/31/2021   LDLCALC 46 08/31/2021   TRIG 137 08/31/2021   CHOLHDL 2.8 08/31/2021     3. Persistent atrial fibrillation (HCC) Denies palpitation or heart racing. Is on eliquis with no bleeding issues. Saw cardiologist a few weeks ago.  4. Type 2 diabetes mellitus with other specified complication, without long-term current use of insulin (HCC) Fasting blood sugars are running around 110-140. She deneis any lw blood sugars. Is not very compliant with low carb diet. Lab Results  Component Value Date   HGBA1C 7.4 (H) 08/31/2021     5. Peripheral edema Has occasional lower ext edema. Her cardilogist told her she did not need to take fluid pill daily. She has swelling if she does not take daily.  6. Severe obesity (BMI >= 40) (HCC) Weight is down 3lbs Wt Readings from Last 3 Encounters:  12/06/21 256 lb (116.1 kg)  11/29/21 259 lb 6.4 oz (117.7 kg)  08/31/21 271 lb (122.9 kg)   BMI Readings from Last 3 Encounters:  12/06/21 46.82 kg/m  11/29/21 47.44 kg/m  08/31/21 49.57 kg/m     New complaints: None today  Allergies  Allergen Reactions   Ciprofloxacin Hcl Hives   Outpatient  Encounter Medications as of 12/06/2021  Medication Sig   Accu-Chek Softclix Lancets lancets TEST BLOOD SUGAR TWICE DAILY Dx E11.9   acetaminophen (TYLENOL) 500 MG tablet Take 1,000 mg by mouth every 6 (six) hours as needed (for pain/headaches.).   albuterol (VENTOLIN HFA) 108 (90 Base) MCG/ACT inhaler INHALE 2 PUFFS INTO THE LUNGS EVERY 6 HOURS AS NEEDED FOR WHEEZE OR SHORTNESS OF BREATH   Alcohol Swabs (DROPSAFE ALCOHOL PREP) 70 % PADS CHECK BLOOD SUGAR TWICE DAILY   ALPRAZolam (XANAX) 0.25 MG tablet Take 1 tablet (0.25 mg total) by mouth daily as needed for anxiety.   amLODipine (NORVASC) 5 MG tablet Take 1 tablet (5 mg total) by mouth daily.   apixaban (ELIQUIS) 5 MG TABS tablet Take 1 tablet (5 mg total) by mouth 2 (two) times daily.   Ascorbic Acid (VITAMIN C) 1000 MG tablet Take 1,000 mg by mouth daily.   Blood Glucose Monitoring Suppl (ACCU-CHEK GUIDE ME) w/Device KIT TEST BLOOD SUGAR TWICE DAILY Dx E11.9   Cholecalciferol (VITAMIN D3) 1000 units CAPS Take 1 capsule by mouth daily.   CINNAMON PO Take 1 capsule by mouth daily.    cloNIDine (CATAPRES) 0.2 MG tablet Take 1 tablet (0.2 mg total) by mouth 2 (two) times daily.   furosemide (LASIX) 20 MG tablet Take 1 tablet (20 mg total) by mouth daily.   Ginger, Zingiber  officinalis, (GINGER ROOT PO) Take 1 capsule by mouth daily.    glipiZIDE (GLUCOTROL) 5 MG tablet Take 1 tablet (5 mg total) by mouth 2 (two) times daily.   glucose blood (ACCU-CHEK AVIVA PLUS) test strip TEST BLOOD SUGAR TWICE DAILY Dx E11.9   lisinopril (ZESTRIL) 40 MG tablet Take 1 tablet (40 mg total) by mouth daily.   metFORMIN (GLUCOPHAGE) 1000 MG tablet Take 1 tablet (1,000 mg total) by mouth 2 (two) times daily with a meal.   metoprolol tartrate (LOPRESSOR) 50 MG tablet Take 1 tablet (50 mg total) by mouth 2 (two) times daily. (Patient taking differently: Take 25 mg by mouth 2 (two) times daily.)   Multiple Vitamins-Minerals (ZINC PO) Take 1 tablet by mouth daily.    nitroGLYCERIN (NITROSTAT) 0.4 MG SL tablet Place 1 tablet (0.4 mg total) under the tongue every 5 (five) minutes x 3 doses as needed for chest pain.   Omega-3 Fatty Acids (FISH OIL) 1000 MG CAPS Take 1 capsule by mouth daily.   simvastatin (ZOCOR) 40 MG tablet Take 1 tablet (40 mg total) by mouth every evening.   Turmeric Curcumin 500 MG CAPS Take 500 mg by mouth daily.    No facility-administered encounter medications on file as of 12/06/2021.    Past Surgical History:  Procedure Laterality Date   CYST EXCISION Left 03/15/2017   from buttocks   ORIF ANKLE FRACTURE Left 04/20/2017   Procedure: OPEN REDUCTION INTERNAL FIXATION (ORIF) LEFT ANKLE FRACTURE;  Surgeon: Netta Cedars, MD;  Location: [facility];  Service: Orthopedics;  Laterality: Left;    Family History  Problem Relation Age of Onset   Diabetes Mother    CAD Mother        CABG at age 60   Hypertension Mother    Hyperlipidemia Mother    Diabetes Father    Heart disease Father        CHF   Hypertension Father    Diabetes Sister    CAD Sister    Hypertension Sister    Diabetes Brother    Hypertension Brother    Healthy Sister       Controlled substance contract:  09/01/21- only takes xanax as needed- only takes a couple  of times a week.     Review of Systems  Constitutional:  Negative for diaphoresis.  Eyes:  Negative for pain.  Respiratory:  Negative for shortness of breath.   Cardiovascular:  Negative for chest pain, palpitations and leg swelling.  Gastrointestinal:  Negative for abdominal pain.  Endocrine: Negative for polydipsia.  Skin:  Negative for rash.  Neurological:  Negative for dizziness, weakness and headaches.  Hematological:  Does not bruise/bleed easily.  All other systems reviewed and are negative.      Objective:   Physical Exam Vitals and nursing note reviewed.  Constitutional:      General: She is not in acute distress.    Appearance: Normal appearance. She is well-developed.  HENT:      Head: Normocephalic.     Right Ear: Tympanic membrane normal.     Left Ear: Tympanic membrane normal.     Nose: Nose normal.     Mouth/Throat:     Mouth: Mucous membranes are moist.  Eyes:     Pupils: Pupils are equal, round, and reactive to light.  Neck:     Vascular: No carotid bruit or JVD.  Cardiovascular:     Rate and Rhythm: Normal rate and regular rhythm.     Heart  sounds: Normal heart sounds.  Pulmonary:     Effort: Pulmonary effort is normal. No respiratory distress.     Breath sounds: Normal breath sounds. No wheezing or rales.  Chest:     Chest wall: No tenderness.  Abdominal:     General: Bowel sounds are normal. There is no distension or abdominal bruit.     Palpations: Abdomen is soft. There is no hepatomegaly, splenomegaly, mass or pulsatile mass.     Tenderness: There is no abdominal tenderness.  Musculoskeletal:        General: Normal range of motion.     Cervical back: Normal range of motion and neck supple.  Lymphadenopathy:     Cervical: No cervical adenopathy.  Skin:    General: Skin is warm and dry.  Neurological:     Mental Status: She is alert and oriented to person, place, and time.     Deep Tendon Reflexes: Reflexes are normal and symmetric.  Psychiatric:        Behavior: Behavior normal.        Thought Content: Thought content normal.        Judgment: Judgment normal.     BP 117/68   Pulse 71   Temp 97.8 F (36.6 C) (Temporal)   Resp 20   Ht _0  (1.575 m)   Wt 256 lb (116.1 kg)   SpO2 97%   BMI 46.82 kg/m   HGBA1c 7.0%     Assessment & Plan:   Ashley [name] comes in today with chief complaint of Medical Management of Chronic Issues   Diagnosis and orders addressed:  1. Primary hypertension Low sodium diet - CBC with Differential/Platelet - CMP14+EGFR - lisinopril (ZESTRIL) 40 MG tablet; Take 1 tablet (40 mg total) by mouth daily.  Dispense: 90 tablet; Refill: 1  2. Mixed hyperlipidemia Low fat diet - Lipid panel -  simvastatin (ZOCOR) 40 MG tablet; Take 1 tablet (40 mg total) by mouth every evening.  Dispense: 90 tablet; Refill: 1  3. Persistent atrial fibrillation (HCC) Avoid caffeine - apixaban (ELIQUIS) 5 MG TABS tablet; Take 1 tablet (5 mg total) by mouth 2 (two) times daily.  Dispense: 60 tablet; Refill: 5 - metoprolol tartrate (LOPRESSOR) 50 MG tablet; Take 1 tablet (50 mg total) by mouth 2 (two) times daily.  Dispense: 180 tablet; Refill: 1  4. Type 2 diabetes mellitus with other specified complication, without long-term current use of insulin (HCC) Watch carbs in diet - Bayer DCA Hb A1c Waived  5. Peripheral edema Elevate legs when sitting - furosemide (LASIX) 20 MG tablet; Take 1 tablet (20 mg total) by mouth daily.  Dispense: 90 tablet; Refill: 1  6. Severe obesity (BMI >= 40) (HCC) Discussed diet and exercise for person with BMI >25 Will recheck weight in 3-6 months  7. GAD (generalized anxiety disorder) Stress management - ToxASSURE Select 13 (MW), Urine - ALPRAZolam (XANAX) 0.25 MG tablet; Take 1 tablet (0.25 mg total) by mouth daily as needed for anxiety.  Dispense: 30 tablet; Refill: 2  8. Encounter for hepatitis C screening test for low risk patient - Hepatitis C antibody  9. Hypertension associated with diabetes ([place]) Low sodium diet - amLODipine (NORVASC) 5 MG tablet; Take 1 tablet (5 mg total) by mouth daily.  Dispense: 90 tablet; Refill: 1 - cloNIDine (CATAPRES) 0.2 MG tablet; Take 1 tablet (0.2 mg total) by mouth 2 (two) times daily.  Dispense: 180 tablet; Refill: 1  10. Type 2 diabetes mellitus without complication, without long-term  current use of insulin (HCC) - glipiZIDE (GLUCOTROL) 5 MG tablet; Take 1 tablet (5 mg total) by mouth 2 (two) times daily.  Dispense: 180 tablet; Refill: 1 - metFORMIN (GLUCOPHAGE) 1000 MG tablet; Take 1 tablet (1,000 mg total) by mouth 2 (two) times daily with a meal.  Dispense: 180 tablet; Refill: 1   Labs pending Health Maintenance  reviewed Diet and exercise encouraged  Follow up plan: 6 month   [place], FNP

## 2021-12-06 NOTE — Patient Instructions (Signed)

## 2021-12-07 ENCOUNTER — Other Ambulatory Visit: Payer: Medicare HMO

## 2021-12-07 LAB — CBC WITH DIFFERENTIAL/PLATELET
Basophils Absolute: 0.1 10*3/uL (ref 0.0–0.2)
Basos: 1 %
EOS (ABSOLUTE): 0.2 10*3/uL (ref 0.0–0.4)
Eos: 2 %
Hematocrit: 40.7 % (ref 34.0–46.6)
Hemoglobin: 13.6 g/dL (ref 11.1–15.9)
Immature Grans (Abs): 0 10*3/uL (ref 0.0–0.1)
Immature Granulocytes: 0 %
Lymphocytes Absolute: 2 10*3/uL (ref 0.7–3.1)
Lymphs: 27 %
MCH: 31.4 pg (ref 26.6–33.0)
MCHC: 33.4 g/dL (ref 31.5–35.7)
MCV: 94 fL (ref 79–97)
Monocytes Absolute: 0.5 10*3/uL (ref 0.1–0.9)
Monocytes: 7 %
Neutrophils Absolute: 4.5 10*3/uL (ref 1.4–7.0)
Neutrophils: 63 %
Platelets: 222 10*3/uL (ref 150–450)
RBC: 4.33 x10E6/uL (ref 3.77–5.28)
RDW: 13.2 % (ref 11.7–15.4)
WBC: 7.2 10*3/uL (ref 3.4–10.8)

## 2021-12-07 LAB — CMP14+EGFR
ALT: 15 IU/L (ref 0–32)
AST: 31 IU/L (ref 0–40)
Albumin/Globulin Ratio: 1.5 (ref 1.2–2.2)
Albumin: 4.3 g/dL (ref 3.9–4.9)
Alkaline Phosphatase: 60 IU/L (ref 44–121)
BUN/Creatinine Ratio: 22 (ref 12–28)
BUN: 13 mg/dL (ref 8–27)
Bilirubin Total: 0.7 mg/dL (ref 0.0–1.2)
CO2: 20 mmol/L (ref 20–29)
Calcium: 9.6 mg/dL (ref 8.7–10.3)
Chloride: 102 mmol/L (ref 96–106)
Creatinine, Ser: 0.58 mg/dL (ref 0.57–1.00)
Globulin, Total: 2.8 g/dL (ref 1.5–4.5)
Glucose: 202 mg/dL — ABNORMAL HIGH (ref 70–99)
Potassium: 4 mmol/L (ref 3.5–5.2)
Sodium: 142 mmol/L (ref 134–144)
Total Protein: 7.1 g/dL (ref 6.0–8.5)
eGFR: 98 mL/min/{1.73_m2} (ref >59)

## 2021-12-07 LAB — LIPID PANEL
Chol/HDL Ratio: 3.2 ratio (ref 0.0–4.4)
Cholesterol, Total: 134 mg/dL (ref 100–199)
HDL: 42 mg/dL (ref >39)
LDL Chol Calc (NIH): 67 mg/dL (ref 0–99)
Triglycerides: 145 mg/dL (ref 0–149)
VLDL Cholesterol Cal: 25 mg/dL (ref 5–40)

## 2021-12-07 LAB — HEPATITIS C ANTIBODY: Hep C Virus Ab: NONREACTIVE

## 2021-12-10 LAB — TOXASSURE SELECT 13 (MW), URINE

## 2021-12-17 ENCOUNTER — Ambulatory Visit (INDEPENDENT_AMBULATORY_CARE_PROVIDER_SITE_OTHER): Payer: Self-pay | Admitting: Nurse Practitioner

## 2021-12-17 ENCOUNTER — Encounter: Payer: Self-pay | Admitting: Nurse Practitioner

## 2021-12-17 DIAGNOSIS — Z024 Encounter for examination for driving license: Secondary | ICD-10-CM

## 2021-12-17 LAB — URINALYSIS
Bilirubin, UA: NEGATIVE
Glucose, UA: NEGATIVE
Ketones, UA: NEGATIVE
Leukocytes,UA: NEGATIVE
Nitrite, UA: NEGATIVE
Protein,UA: NEGATIVE
RBC, UA: NEGATIVE
Specific Gravity, UA: 1.01 (ref 1.005–1.030)
Urobilinogen, Ur: 0.2 mg/dL (ref 0.2–1.0)
pH, UA: 5 (ref 5.0–7.5)

## 2021-12-17 NOTE — Progress Notes (Signed)
[employer]- see scanned in document

## 2022-01-06 DIAGNOSIS — M722 Plantar fascial fibromatosis: Secondary | ICD-10-CM | POA: Diagnosis not present

## 2022-01-06 DIAGNOSIS — M79671 Pain in right foot: Secondary | ICD-10-CM | POA: Diagnosis not present

## 2022-01-06 DIAGNOSIS — M79673 Pain in unspecified foot: Secondary | ICD-10-CM | POA: Diagnosis not present

## 2022-01-07 ENCOUNTER — Other Ambulatory Visit: Payer: Self-pay | Admitting: *Deleted

## 2022-01-07 DIAGNOSIS — L209 Atopic dermatitis, unspecified: Secondary | ICD-10-CM

## 2022-01-13 ENCOUNTER — Ambulatory Visit: Payer: Medicare HMO | Admitting: Cardiology

## 2022-01-22 DIAGNOSIS — R0602 Shortness of breath: Secondary | ICD-10-CM | POA: Diagnosis not present

## 2022-01-22 DIAGNOSIS — Z1152 Encounter for screening for COVID-19: Secondary | ICD-10-CM | POA: Diagnosis not present

## 2022-01-22 DIAGNOSIS — M791 Myalgia, unspecified site: Secondary | ICD-10-CM | POA: Diagnosis not present

## 2022-01-22 DIAGNOSIS — R197 Diarrhea, unspecified: Secondary | ICD-10-CM | POA: Diagnosis not present

## 2022-01-22 DIAGNOSIS — J181 Lobar pneumonia, unspecified organism: Secondary | ICD-10-CM | POA: Diagnosis not present

## 2022-01-22 DIAGNOSIS — R059 Cough, unspecified: Secondary | ICD-10-CM | POA: Diagnosis not present

## 2022-01-27 ENCOUNTER — Telehealth: Payer: Self-pay | Admitting: Nurse Practitioner

## 2022-01-27 NOTE — Telephone Encounter (Signed)
Pt c/o continued cough and wanted to know what she can take otc to help. Advised she can try Delsym when she needs it for the cough.

## 2022-02-02 ENCOUNTER — Other Ambulatory Visit: Payer: Medicare HMO

## 2022-02-03 ENCOUNTER — Encounter: Payer: Self-pay | Admitting: Nurse Practitioner

## 2022-02-03 ENCOUNTER — Telehealth (INDEPENDENT_AMBULATORY_CARE_PROVIDER_SITE_OTHER): Payer: Medicare HMO | Admitting: Nurse Practitioner

## 2022-02-03 DIAGNOSIS — R051 Acute cough: Secondary | ICD-10-CM

## 2022-02-03 MED ORDER — PREDNISONE 20 MG PO TABS: 40.0000 mg | ORAL_TABLET | Freq: Every day | ORAL | 0 refills | Status: AC

## 2022-02-03 MED ORDER — HYDROCODONE BIT-HOMATROP MBR 5-1.5 MG/5ML PO SOLN: 5.0000 mL | Freq: Three times a day (TID) | ORAL | 0 refills | Status: DC | PRN

## 2022-02-03 NOTE — Patient Instructions (Addendum)
1. Take meds as prescribed 2. Use a cool mist humidifier especially during the winter months and when heat has been humid. 3. Use saline nose sprays frequently 4. Saline irrigations of the nose can be very helpful if done frequently.  * 4X daily for 1 week*  * Use of a nettie pot can be helpful with this. Follow directions with this* 5. Drink plenty of fluids 6. Keep thermostat turn down low 7.For any cough or congestion- tessalon perles during day and hycodan at night 8. For fever or aces or pains- take tylenol or ibuprofen appropriate for age and weight.  * for fevers greater than 101 orally you may alternate ibuprofen and tylenol every  3 hours.

## 2022-02-03 NOTE — Progress Notes (Signed)
 Virtual Visit Consent   Ashley [name], you are scheduled for a virtual visit with Mary-Margaret , FNP, a [facility] provider, today.     Just as with appointments in the office, your consent must be obtained to participate.  Your consent will be active for this visit and any virtual visit you may have with one of our providers in the next 365 days.     If you have a MyChart account, a copy of this consent can be sent to you electronically.  All virtual visits are billed to your insurance company just like a traditional visit in the office.    As this is a virtual visit, video technology does not allow for your provider to perform a traditional examination.  This may limit your provider's ability to fully assess your condition.  If your provider identifies any concerns that need to be evaluated in person or the need to arrange testing (such as labs, EKG, etc.), we will make arrangements to do so.     Although advances in technology are sophisticated, we cannot ensure that it will always work on either your end or our end.  If the connection with a video visit is poor, the visit may have to be switched to a telephone visit.  With either a video or telephone visit, we are not always able to ensure that we have a secure connection.     I need to obtain your verbal consent now.   Are you willing to proceed with your visit today? YES   Ashley [name] has provided verbal consent on 02/03/2022 for a virtual visit (video or telephone).   Mary-Margaret , FNP   Date: 02/03/2022 1:50 PM   Virtual Visit via Video Note   I, Mary-Margaret , connected with Ashley [name] ([id number], 09/19/1950) on 02/03/22 at  2:30 PM EST by a video-enabled telemedicine application and verified that I am speaking with the correct person using two identifiers.  Location: Patient: Virtual Visit Location Patient: Home Provider: Virtual Visit Location Provider: Mobile   I discussed the limitations of  evaluation and management by telemedicine and the availability of in person appointments. The patient expressed understanding and agreed to proceed.    History of Present Illness: Ashley [name] is a 71 y.o. who identifies as a female who was assigned female at birth, and is being seen today for congestion.  HPI: Patient has been on antibiotic for pneumonia. Took last one on 01/29/22. Her cough just wont go away.  Cough This is a new problem. The current episode started 1 to 4 weeks ago. The problem has been gradually improving. The problem occurs hourly. The cough is Non-productive. Associated symptoms include rhinorrhea. Pertinent negatives include no ear congestion, ear pain or fever. Nothing aggravates the symptoms. She has tried OTC cough suppressant for the symptoms. The treatment provided mild relief.    Review of Systems  Constitutional:  Negative for fever.  HENT:  Positive for rhinorrhea. Negative for ear pain.   Respiratory:  Positive for cough.     Problems:  Patient Active Problem List   Diagnosis Date Noted   Systolic murmur 11/29/2021   Persistent atrial fibrillation (HCC)    Severe obesity (BMI >= 40) (HCC) 07/30/2014   Hyperlipidemia 05/31/2010   HTN (hypertension) 05/31/2010   Diabetes (HCC) 05/31/2010   Peripheral edema 05/31/2010    Allergies:  Allergies  Allergen Reactions   Ciprofloxacin Hcl Hives   Medications:  Current Outpatient Medications:    Accu-Chek Softclix    Virtual Visit Consent   Ashley [name], you are scheduled for a virtual visit with Mary-Margaret Martin, FNP, a [facility] provider, today.     Just as with appointments in the office, your consent must be obtained to participate.  Your consent will be active for this visit and any virtual visit you may have with one of our providers in the next 365 days.     If you have a MyChart account, a copy of this consent can be sent to you electronically.  All virtual visits are billed to your insurance company just like a traditional visit in the office.    As this is a virtual visit, video technology does not allow for your provider to perform a traditional examination.  This may limit your provider's ability to fully assess your condition.  If your provider identifies any concerns that need to be evaluated in person or the need to arrange testing (such as labs, EKG, etc.), we will make arrangements to do so.     Although advances in technology are sophisticated, we cannot ensure that it will always work on either your end or our end.  If the connection with a video visit is poor, the visit may have to be switched to a telephone visit.  With either a video or telephone visit, we are not always able to ensure that we have a secure connection.     I need to obtain your verbal consent now.   Are you willing to proceed with your visit today? YES   Ashley [name] has provided verbal consent on 02/03/2022 for a virtual visit (video or telephone).   Mary-Margaret Martin, FNP   Date: 02/03/2022 1:50 PM   Virtual Visit via Video Note   I, Mary-Margaret Martin, connected with Ashley [name] ([id number], 09/19/1950) on 02/03/22 at  2:30 PM EST by a video-enabled telemedicine application and verified that I am speaking with the correct person using two identifiers.  Location: Patient: Virtual Visit Location Patient: Home Provider: Virtual Visit Location Provider: Mobile   I discussed the limitations of  evaluation and management by telemedicine and the availability of in person appointments. The patient expressed understanding and agreed to proceed.    History of Present Illness: Ashley [name] is a 71 y.o. who identifies as a female who was assigned female at birth, and is being seen today for congestion.  HPI: Patient has been on antibiotic for pneumonia. Took last one on 01/29/22. Her cough just wont go away.  Cough This is a new problem. The current episode started 1 to 4 weeks ago. The problem has been gradually improving. The problem occurs hourly. The cough is Non-productive. Associated symptoms include rhinorrhea. Pertinent negatives include no ear congestion, ear pain or fever. Nothing aggravates the symptoms. She has tried OTC cough suppressant for the symptoms. The treatment provided mild relief.    Review of Systems  Constitutional:  Negative for fever.  HENT:  Positive for rhinorrhea. Negative for ear pain.   Respiratory:  Positive for cough.     Problems:  Patient Active Problem List   Diagnosis Date Noted   Systolic murmur 11/29/2021   Persistent atrial fibrillation (HCC)    Severe obesity (BMI >= 40) (HCC) 07/30/2014   Hyperlipidemia 05/31/2010   HTN (hypertension) 05/31/2010   Diabetes (HCC) 05/31/2010   Peripheral edema 05/31/2010    Allergies:  Allergies  Allergen Reactions   Ciprofloxacin Hcl Hives   Medications:  Current Outpatient Medications:    Accu-Chek Softclix    Virtual Visit Consent   Ashley [name], you are scheduled for a virtual visit with Mary-Margaret Martin, FNP, a [facility] provider, today.     Just as with appointments in the office, your consent must be obtained to participate.  Your consent will be active for this visit and any virtual visit you may have with one of our providers in the next 365 days.     If you have a MyChart account, a copy of this consent can be sent to you electronically.  All virtual visits are billed to your insurance company just like a traditional visit in the office.    As this is a virtual visit, video technology does not allow for your provider to perform a traditional examination.  This may limit your provider's ability to fully assess your condition.  If your provider identifies any concerns that need to be evaluated in person or the need to arrange testing (such as labs, EKG, etc.), we will make arrangements to do so.     Although advances in technology are sophisticated, we cannot ensure that it will always work on either your end or our end.  If the connection with a video visit is poor, the visit may have to be switched to a telephone visit.  With either a video or telephone visit, we are not always able to ensure that we have a secure connection.     I need to obtain your verbal consent now.   Are you willing to proceed with your visit today? YES   Ashley [name] has provided verbal consent on 02/03/2022 for a virtual visit (video or telephone).   Mary-Margaret Martin, FNP   Date: 02/03/2022 1:50 PM   Virtual Visit via Video Note   I, Mary-Margaret Martin, connected with Ashley [name] ([id number], 09/19/1950) on 02/03/22 at  2:30 PM EST by a video-enabled telemedicine application and verified that I am speaking with the correct person using two identifiers.  Location: Patient: Virtual Visit Location Patient: Home Provider: Virtual Visit Location Provider: Mobile   I discussed the limitations of  evaluation and management by telemedicine and the availability of in person appointments. The patient expressed understanding and agreed to proceed.    History of Present Illness: Ashley [name] is a 71 y.o. who identifies as a female who was assigned female at birth, and is being seen today for congestion.  HPI: Patient has been on antibiotic for pneumonia. Took last one on 01/29/22. Her cough just wont go away.  Cough This is a new problem. The current episode started 1 to 4 weeks ago. The problem has been gradually improving. The problem occurs hourly. The cough is Non-productive. Associated symptoms include rhinorrhea. Pertinent negatives include no ear congestion, ear pain or fever. Nothing aggravates the symptoms. She has tried OTC cough suppressant for the symptoms. The treatment provided mild relief.    Review of Systems  Constitutional:  Negative for fever.  HENT:  Positive for rhinorrhea. Negative for ear pain.   Respiratory:  Positive for cough.     Problems:  Patient Active Problem List   Diagnosis Date Noted   Systolic murmur 11/29/2021   Persistent atrial fibrillation (HCC)    Severe obesity (BMI >= 40) (HCC) 07/30/2014   Hyperlipidemia 05/31/2010   HTN (hypertension) 05/31/2010   Diabetes (HCC) 05/31/2010   Peripheral edema 05/31/2010    Allergies:  Allergies  Allergen Reactions   Ciprofloxacin Hcl Hives   Medications:  Current Outpatient Medications:    Accu-Chek Softclix

## 2022-02-08 ENCOUNTER — Other Ambulatory Visit: Payer: Self-pay

## 2022-02-08 DIAGNOSIS — R051 Acute cough: Secondary | ICD-10-CM

## 2022-02-08 MED ORDER — HYDROCODONE BIT-HOMATROP MBR 5-1.5 MG/5ML PO SOLN: 5.0000 mL | Freq: Three times a day (TID) | ORAL | 0 refills | Status: DC | PRN

## 2022-02-28 ENCOUNTER — Ambulatory Visit: Payer: Medicare HMO | Attending: Internal Medicine

## 2022-02-28 DIAGNOSIS — R011 Cardiac murmur, unspecified: Secondary | ICD-10-CM | POA: Diagnosis not present

## 2022-02-28 DIAGNOSIS — Z1231 Encounter for screening mammogram for malignant neoplasm of breast: Secondary | ICD-10-CM | POA: Diagnosis not present

## 2022-02-28 LAB — ECHOCARDIOGRAM COMPLETE
AR max vel: 1.15 cm2
AV Area VTI: 1.19 cm2
AV Area mean vel: 1.07 cm2
AV Mean grad: 12.5 mmHg
AV Peak grad: 23.2 mmHg
Ao pk vel: 2.41 m/s
Area-P 1/2: 5.23 cm2
Calc EF: 64.7 %
MV M vel: 3.3 m/s
MV Peak grad: 43.6 mmHg
S' Lateral: 2.4 cm
Single Plane A2C EF: 62.7 %
Single Plane A4C EF: 67.2 %

## 2022-02-28 MED ORDER — PERFLUTREN LIPID MICROSPHERE
1.0000 mL | INTRAVENOUS | Status: AC | PRN
  Administered 2022-02-28: 2 mL via INTRAVENOUS

## 2022-03-03 ENCOUNTER — Encounter: Payer: Self-pay | Admitting: Nurse Practitioner

## 2022-03-04 ENCOUNTER — Ambulatory Visit: Payer: Medicare HMO | Admitting: Internal Medicine

## 2022-03-21 ENCOUNTER — Encounter: Payer: Self-pay | Admitting: Internal Medicine

## 2022-03-21 ENCOUNTER — Ambulatory Visit: Payer: Medicare HMO | Attending: Internal Medicine | Admitting: Internal Medicine

## 2022-03-21 VITALS — BP 151/82 | HR 65 | Ht 63.0 in | Wt 257.4 lb

## 2022-03-21 DIAGNOSIS — I4819 Other persistent atrial fibrillation: Secondary | ICD-10-CM

## 2022-03-21 DIAGNOSIS — I350 Nonrheumatic aortic (valve) stenosis: Secondary | ICD-10-CM | POA: Diagnosis not present

## 2022-03-21 NOTE — Patient Instructions (Addendum)
Medication Instructions:  Your physician has recommended you make the following change in your medication:  Stop nitroglycerin Continue other medications the same  Labwork: none  Testing/Procedures: none  Follow-Up: Your physician recommends that you schedule a follow-up appointment in: 1 year. You will receive a reminder call in the mail in about 10 months reminding you to call and schedule your appointment. If you don't receive this call, please contact our office.  Any Other Special Instructions Will Be Listed Below (If Applicable).  If you need a refill on your cardiac medications before your next appointment, please call your pharmacy.

## 2022-03-21 NOTE — Progress Notes (Unsigned)
Cardiology Office Note  Date: 03/21/2022   ID: Tikeya Vaux, DOB 1950-11-02, MRN [id number]  PCP:  Chevis Pretty, FNP  Cardiologist:  Chalmers Guest, MD Electrophysiologist:  None   Reason for Office Visit: chest pain request of Hassell Done, FNP   History of Present Illness: Ashley [name] is a 72 y.o. female known to have A-fib, HTN, DM 2, HLD was referred to the cardiology clinic at the request of Hassell Done, [place] for evaluation of chest pain. Patient reported substernal chest tightness/discomfort occurring with strenuous exertional activities (doing yard work in a hot summer), lasting for a few minutes before it resolved with rest. Frequency is once in 2 to 3 months.  Denied any DOE, dizziness, lightheadedness, syncope, LE swelling, claudication, palpitations.  Denies smoking cigarettes, illicit drug use, alcohol use.  Compliant with medications and no bleeding complications.  Patient stated that she had to pay $150 co-pay every month to purchase Eliquis and she did not want to switch to Coumadin due to frequent INR checks. Currently does not have any risk of falls.  Past Medical History:  Diagnosis Date   Depression    Diabetes mellitus without complication ([place])    Hyperlipidemia    Hypertension     Past Surgical History:  Procedure Laterality Date   CYST EXCISION Left 03/15/2017   from buttocks   ORIF ANKLE FRACTURE Left 04/20/2017   Procedure: OPEN REDUCTION INTERNAL FIXATION (ORIF) LEFT ANKLE FRACTURE;  Surgeon: Netta Cedars, MD;  Location: [facility];  Service: Orthopedics;  Laterality: Left;    Current Outpatient Medications  Medication Sig Dispense Refill   Accu-Chek Softclix Lancets lancets TEST BLOOD SUGAR TWICE DAILY Dx E11.9 200 each 3   acetaminophen (TYLENOL) 500 MG tablet Take 1,000 mg by mouth every 6 (six) hours as needed (for pain/headaches.).     albuterol (VENTOLIN HFA) 108 (90 Base) MCG/ACT inhaler INHALE 2 PUFFS INTO THE LUNGS EVERY 6 HOURS AS NEEDED FOR  WHEEZE OR SHORTNESS OF BREATH 6.7 each 1   Alcohol Swabs (DROPSAFE ALCOHOL PREP) 70 % PADS CHECK BLOOD SUGAR TWICE DAILY 200 each 3   ALPRAZolam (XANAX) 0.25 MG tablet Take 1 tablet (0.25 mg total) by mouth daily as needed for anxiety. 30 tablet 2   amLODipine (NORVASC) 5 MG tablet Take 1 tablet (5 mg total) by mouth daily. 90 tablet 1   amoxicillin-clavulanate (AUGMENTIN) 875-125 MG tablet Take 1 tablet by mouth 2 (two) times daily.     apixaban (ELIQUIS) 5 MG TABS tablet Take 1 tablet (5 mg total) by mouth 2 (two) times daily. 60 tablet 5   Ascorbic Acid (VITAMIN C) 1000 MG tablet Take 1,000 mg by mouth daily.     azithromycin (ZITHROMAX) 250 MG tablet Take by mouth.     Blood Glucose Monitoring Suppl (ACCU-CHEK GUIDE ME) w/Device KIT TEST BLOOD SUGAR TWICE DAILY Dx E11.9 1 kit 0   Cholecalciferol (VITAMIN D3) 1000 units CAPS Take 1 capsule by mouth daily.     CINNAMON PO Take 1 capsule by mouth daily.      cloNIDine (CATAPRES) 0.2 MG tablet Take 1 tablet (0.2 mg total) by mouth 2 (two) times daily. 180 tablet 1   furosemide (LASIX) 20 MG tablet Take 1 tablet (20 mg total) by mouth daily. 90 tablet 1   Ginger, Zingiber officinalis, (GINGER ROOT PO) Take 1 capsule by mouth daily.      glipiZIDE (GLUCOTROL) 5 MG tablet Take 1 tablet (5 mg total) by mouth 2 (two)  times daily. 180 tablet 1   glucose blood (ACCU-CHEK AVIVA PLUS) test strip TEST BLOOD SUGAR TWICE DAILY Dx E11.9 200 strip 3   HYDROcodone bit-homatropine (HYCODAN) 5-1.5 MG/5ML syrup Take 5 mLs by mouth every 8 (eight) hours as needed for cough. 120 mL 0   lisinopril (ZESTRIL) 40 MG tablet Take 1 tablet (40 mg total) by mouth daily. 90 tablet 1   metFORMIN (GLUCOPHAGE) 1000 MG tablet Take 1 tablet (1,000 mg total) by mouth 2 (two) times daily with a meal. 180 tablet 1   metoprolol tartrate (LOPRESSOR) 50 MG tablet Take 1 tablet (50 mg total) by mouth 2 (two) times daily. 180 tablet 1   Multiple Vitamins-Minerals (ZINC PO) Take 1 tablet  by mouth daily.     Omega-3 Fatty Acids (FISH OIL) 1000 MG CAPS Take 1 capsule by mouth daily.     simvastatin (ZOCOR) 40 MG tablet Take 1 tablet (40 mg total) by mouth every evening. 90 tablet 1   Turmeric Curcumin 500 MG CAPS Take 500 mg by mouth daily.      nitroGLYCERIN (NITROSTAT) 0.4 MG SL tablet Place 1 tablet (0.4 mg total) under the tongue every 5 (five) minutes x 3 doses as needed for chest pain. 90 tablet 3   zolpidem (AMBIEN) 5 MG tablet TAKE 1 TABLET AT BEDTIME FOR SLEEP     No current facility-administered medications for this visit.   Allergies:  Ciprofloxacin hcl   Social History: The patient  reports that she has never smoked. She has never used smokeless tobacco. She reports that she does not drink alcohol and does not use drugs.   Family History: The patient's family history includes CAD in her mother and sister; Diabetes in her brother, father, mother, and sister; Healthy in her sister; Heart disease in her father; Hyperlipidemia in her mother; Hypertension in her brother, father, mother, and sister.   ROS:  Please see the history of present illness. Otherwise, complete review of systems is positive for none.  All other systems are reviewed and negative.   Physical Exam: VS:  BP (!) 151/82   Pulse 65   Ht 5' 3"$  (1.6 m)   Wt 257 lb 6.4 oz (116.8 kg)   SpO2 96%   BMI 45.60 kg/m , BMI Body mass index is 45.6 kg/m.  Wt Readings from Last 3 Encounters:  03/21/22 257 lb 6.4 oz (116.8 kg)  12/06/21 256 lb (116.1 kg)  11/29/21 259 lb 6.4 oz (117.7 kg)    General: Patient appears comfortable at rest. HEENT: Conjunctiva and lids normal, oropharynx clear with moist mucosa. Neck: Supple, no elevated JVP or carotid bruits, no thyromegaly. Lungs: Clear to auscultation, nonlabored breathing at rest. Cardiac: Regular rate and rhythm, no S3 but has significant systolic murmur, no pericardial rub. Abdomen: Soft, nontender, no hepatomegaly, bowel sounds present, no guarding or  rebound. Extremities: No pitting edema, distal pulses 2+. Skin: Warm and dry. Musculoskeletal: No kyphosis. Neuropsychiatric: Alert and oriented x3, affect grossly appropriate.  ECG:  An ECG dated 11/29/21 was personally reviewed today and demonstrated:  AFib  Recent Labwork: 12/06/2021: ALT 15; AST 31; BUN 13; Creatinine, Ser 0.58; Hemoglobin 13.6; Platelets 222; Potassium 4.0; Sodium 142     Component Value Date/Time   CHOL 134 12/06/2021 0915   TRIG 145 12/06/2021 0915   TRIG 118 11/29/2013 0907   HDL 42 12/06/2021 0915   HDL 45 11/29/2013 0907   CHOLHDL 3.2 12/06/2021 0915   LDLCALC 67 12/06/2021 0915  [place] 66 11/29/2013 0907    Other Studies Reviewed Today: Echo in 2019 LVEF 55 to 60% No valvular abnormalities   NM stress test in 2003 No inducible ischemia.  Normal LVEF  Assessment and Plan: Patient is a 72 year old F known to have HTN, A-fib, DM 2, HLD presented to the cardiology clinic for new patient visit for evaluation of chest pain at the request of Hassell Done, [place].  #Cardiac chest pain Plan -Patient has been having substernal chest tightness lasting for few minutes, occurring with exertion resolving with rest. Frequency is once in 2 to 3 months. Due to infrequent nature of the chest pains, will prescribe SL NTG 0.4 mg as needed.  ER precautions for chest pain provided. If her chest pain frequency or duration worsens, she will need cardiac noninvasive testing for CAD screening.  #Systolic murmur, Grade 3/6 Plan -Obtain 2D Echo  #Persistent A-fib, asymptomatic Plan -Continue metoprolol tartrate 25 mg twice a day -Continue Eliquis 5 mg twice a day.  No risk of falls.  No history of stroke.  #HLD, currently at goal Plan -Continue simvastatin 40 mg nightly.  LDL in 40s.  No side effects.  #HTN, controlled Plan -Continue amlodipine 5 mg once daily, lisinopril 40 mg once daily, Lasix 20 mg once daily -Clonidine per PCP  I have spent a total of 45 minutes  with patient reviewing chart, EKGs, labs and examining patient as well as establishing an assessment and plan that was discussed with the patient.  > 50% of time was spent in direct patient care.    Medication Adjustments/Labs and Tests Ordered: Current medicines are reviewed at length with the patient today.  Concerns regarding medicines are outlined above.   Tests Ordered: No orders of the defined types were placed in this encounter.   Medication Changes: No orders of the defined types were placed in this encounter.   Disposition:  Follow up  2 months  Signed Brynnlee Cumpian Fidel Levy, MD, 03/21/2022 3:28 PM    [facility], [place], [place] [address]

## 2022-03-22 DIAGNOSIS — I350 Nonrheumatic aortic (valve) stenosis: Secondary | ICD-10-CM | POA: Insufficient documentation

## 2022-04-01 ENCOUNTER — Other Ambulatory Visit: Payer: Self-pay | Admitting: Nurse Practitioner

## 2022-04-26 ENCOUNTER — Other Ambulatory Visit: Payer: Self-pay | Admitting: *Deleted

## 2022-04-26 DIAGNOSIS — I4819 Other persistent atrial fibrillation: Secondary | ICD-10-CM

## 2022-04-26 MED ORDER — APIXABAN 5 MG PO TABS: 5.0000 mg | ORAL_TABLET | Freq: Two times a day (BID) | ORAL | 3 refills | Status: DC

## 2022-05-12 ENCOUNTER — Encounter: Payer: Self-pay | Admitting: Nurse Practitioner

## 2022-05-12 ENCOUNTER — Ambulatory Visit (INDEPENDENT_AMBULATORY_CARE_PROVIDER_SITE_OTHER): Payer: Medicare HMO | Admitting: Nurse Practitioner

## 2022-05-12 ENCOUNTER — Ambulatory Visit (INDEPENDENT_AMBULATORY_CARE_PROVIDER_SITE_OTHER): Payer: Medicare HMO

## 2022-05-12 VITALS — BP 148/64 | HR 61 | Temp 97.6°F | Resp 20 | Ht 63.0 in | Wt 261.0 lb

## 2022-05-12 DIAGNOSIS — M25552 Pain in left hip: Secondary | ICD-10-CM

## 2022-05-12 MED ORDER — KETOROLAC TROMETHAMINE 60 MG/2ML IM SOLN
60.0000 mg | Freq: Once | INTRAMUSCULAR | Status: AC
  Administered 2022-05-12: 60 mg via INTRAMUSCULAR

## 2022-05-12 MED ORDER — METHYLPREDNISOLONE ACETATE 80 MG/ML IJ SUSP
80.0000 mg | Freq: Once | INTRAMUSCULAR | Status: AC
  Administered 2022-05-12: 80 mg via INTRAMUSCULAR

## 2022-05-12 NOTE — Progress Notes (Signed)
   Subjective:    Patient ID: Ashley [name], female    DOB: 1950-06-10, 72 y.o.   MRN: [id number]   Chief Complaint: hip pain  Hip Pain  There was no injury mechanism. The pain is present in the left hip. The quality of the pain is described as aching. The pain is at a severity of 7/10. The pain is moderate. Pertinent negatives include no inability to bear weight. She reports no foreign bodies present. The symptoms are aggravated by weight bearing and movement. She has tried acetaminophen for the symptoms. The treatment provided mild relief.    Patient Active Problem List   Diagnosis Date Noted   Aortic valve stenosis, mild [id number]   Systolic murmur [id number]   Persistent atrial fibrillation    Severe obesity (BMI >= 40) 07/30/2014   Hyperlipidemia 05/31/2010   HTN (hypertension) 05/31/2010   Diabetes 05/31/2010   Peripheral edema 05/31/2010       Review of Systems  Musculoskeletal:  Positive for arthralgias (left hip).       Objective:   Physical Exam Vitals reviewed.  Constitutional:      Appearance: Normal appearance. She is obese.  Cardiovascular:     Rate and Rhythm: Normal rate and regular rhythm.     Heart sounds: Normal heart sounds.  Pulmonary:     Breath sounds: Normal breath sounds.  Musculoskeletal:     Comments: Rises slowly from sitting to standing Pian with movement in any direction  Skin:    General: Skin is warm.  Neurological:     General: No focal deficit present.     Mental Status: She is alert and oriented to person, place, and time.  Psychiatric:        Mood and Affect: Mood normal.        Behavior: Behavior normal.       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    Assessment & Plan:   Ashley [name] in today with chief complaint of Hip Pain (Left/)   1. Pain of left hip Moist  heat rest - DG HIP UNILAT W OR W/O PELVIS 2-3 VIEWS LEFT - methylPREDNISolone acetate (DEPO-MEDROL) injection 80 mg - ketorolac (TORADOL) injection 60 mg    The above assessment and management plan was discussed with the patient. The patient verbalized understanding of and has agreed to the management plan. Patient is aware to call the clinic if symptoms persist or worsen. Patient is aware when to return to the clinic for a follow-up visit. Patient educated on when it is appropriate to go to the emergency department.   Mary-Margaret Hassell Done, FNP

## 2022-05-19 ENCOUNTER — Telehealth: Payer: Self-pay | Admitting: *Deleted

## 2022-05-19 NOTE — Telephone Encounter (Signed)
Patient informed and advised to bring 2024 prescription out of pocket expense to office so that it can be faxed to [facility]. Verbalized understanding.

## 2022-05-19 NOTE — Telephone Encounter (Signed)
[facility] contacted, spoke with Shanda Bumps to get 3% out of pocket prescription expense amount which is $1,376.00

## 2022-06-03 ENCOUNTER — Other Ambulatory Visit: Payer: Self-pay | Admitting: Nurse Practitioner

## 2022-06-03 DIAGNOSIS — I152 Hypertension secondary to endocrine disorders: Secondary | ICD-10-CM

## 2022-06-03 DIAGNOSIS — R600 Localized edema: Secondary | ICD-10-CM

## 2022-06-06 ENCOUNTER — Other Ambulatory Visit: Payer: Self-pay | Admitting: Nurse Practitioner

## 2022-06-06 ENCOUNTER — Ambulatory Visit: Payer: Medicare HMO | Admitting: Nurse Practitioner

## 2022-06-06 DIAGNOSIS — E119 Type 2 diabetes mellitus without complications: Secondary | ICD-10-CM

## 2022-06-08 ENCOUNTER — Other Ambulatory Visit: Payer: Self-pay | Admitting: Nurse Practitioner

## 2022-06-08 DIAGNOSIS — E119 Type 2 diabetes mellitus without complications: Secondary | ICD-10-CM

## 2022-06-09 ENCOUNTER — Other Ambulatory Visit: Payer: Self-pay

## 2022-06-09 MED ORDER — PREDNISONE 20 MG PO TABS: ORAL_TABLET | ORAL | 0 refills | Status: DC

## 2022-06-17 ENCOUNTER — Encounter: Payer: Self-pay | Admitting: Nurse Practitioner

## 2022-06-17 ENCOUNTER — Telehealth: Payer: Self-pay | Admitting: Nurse Practitioner

## 2022-06-17 ENCOUNTER — Ambulatory Visit (INDEPENDENT_AMBULATORY_CARE_PROVIDER_SITE_OTHER): Payer: Medicare HMO | Admitting: Nurse Practitioner

## 2022-06-17 VITALS — BP 131/67 | HR 54 | Temp 97.4°F | Ht 63.0 in | Wt 254.0 lb

## 2022-06-17 DIAGNOSIS — E1169 Type 2 diabetes mellitus with other specified complication: Secondary | ICD-10-CM

## 2022-06-17 DIAGNOSIS — R011 Cardiac murmur, unspecified: Secondary | ICD-10-CM | POA: Diagnosis not present

## 2022-06-17 DIAGNOSIS — E782 Mixed hyperlipidemia: Secondary | ICD-10-CM

## 2022-06-17 DIAGNOSIS — E1159 Type 2 diabetes mellitus with other circulatory complications: Secondary | ICD-10-CM | POA: Diagnosis not present

## 2022-06-17 DIAGNOSIS — I4819 Other persistent atrial fibrillation: Secondary | ICD-10-CM

## 2022-06-17 DIAGNOSIS — I152 Hypertension secondary to endocrine disorders: Secondary | ICD-10-CM

## 2022-06-17 DIAGNOSIS — Z7984 Long term (current) use of oral hypoglycemic drugs: Secondary | ICD-10-CM

## 2022-06-17 DIAGNOSIS — I10 Essential (primary) hypertension: Secondary | ICD-10-CM | POA: Diagnosis not present

## 2022-06-17 DIAGNOSIS — E119 Type 2 diabetes mellitus without complications: Secondary | ICD-10-CM

## 2022-06-17 DIAGNOSIS — R600 Localized edema: Secondary | ICD-10-CM

## 2022-06-17 DIAGNOSIS — F411 Generalized anxiety disorder: Secondary | ICD-10-CM

## 2022-06-17 DIAGNOSIS — M25552 Pain in left hip: Secondary | ICD-10-CM

## 2022-06-17 LAB — LIPID PANEL

## 2022-06-17 LAB — BAYER DCA HB A1C WAIVED: HB A1C (BAYER DCA - WAIVED): 7.7 % — ABNORMAL HIGH (ref 4.8–5.6)

## 2022-06-17 MED ORDER — APIXABAN 5 MG PO TABS
5.0000 mg | ORAL_TABLET | Freq: Two times a day (BID) | ORAL | 3 refills | Status: DC
Start: 2022-06-17 — End: 2023-03-22

## 2022-06-17 MED ORDER — FUROSEMIDE 20 MG PO TABS
20.0000 mg | ORAL_TABLET | Freq: Every day | ORAL | 1 refills | Status: DC
Start: 2022-06-17 — End: 2023-02-27

## 2022-06-17 MED ORDER — LISINOPRIL 40 MG PO TABS
40.0000 mg | ORAL_TABLET | Freq: Every day | ORAL | 1 refills | Status: DC
Start: 2022-06-17 — End: 2023-02-07

## 2022-06-17 MED ORDER — SIMVASTATIN 40 MG PO TABS
40.0000 mg | ORAL_TABLET | Freq: Every evening | ORAL | 1 refills | Status: DC
Start: 2022-06-17 — End: 2023-01-19

## 2022-06-17 MED ORDER — ALPRAZOLAM 0.25 MG PO TABS
0.2500 mg | ORAL_TABLET | Freq: Every day | ORAL | 5 refills | Status: DC | PRN
Start: 2022-06-17 — End: 2023-01-09

## 2022-06-17 MED ORDER — AMLODIPINE BESYLATE 5 MG PO TABS
5.0000 mg | ORAL_TABLET | Freq: Every day | ORAL | 1 refills | Status: DC
Start: 2022-06-17 — End: 2023-01-26

## 2022-06-17 MED ORDER — CELECOXIB 200 MG PO CAPS
200.0000 mg | ORAL_CAPSULE | Freq: Two times a day (BID) | ORAL | 3 refills | Status: DC
Start: 2022-06-17 — End: 2022-06-17

## 2022-06-17 MED ORDER — GLIPIZIDE 5 MG PO TABS: 5.0000 mg | ORAL_TABLET | Freq: Two times a day (BID) | ORAL | 1 refills | Status: DC

## 2022-06-17 MED ORDER — APIXABAN 5 MG PO TABS: 5.0000 mg | ORAL_TABLET | Freq: Two times a day (BID) | ORAL | 3 refills | Status: DC

## 2022-06-17 MED ORDER — CLONIDINE HCL 0.2 MG PO TABS: 0.2000 mg | ORAL_TABLET | Freq: Two times a day (BID) | ORAL | 1 refills | Status: DC

## 2022-06-17 MED ORDER — CELECOXIB 200 MG PO CAPS
200.0000 mg | ORAL_CAPSULE | Freq: Two times a day (BID) | ORAL | 3 refills | Status: DC
Start: 2022-06-17 — End: 2022-09-22

## 2022-06-17 MED ORDER — METFORMIN HCL 1000 MG PO TABS
1000.0000 mg | ORAL_TABLET | Freq: Two times a day (BID) | ORAL | 1 refills | Status: DC
Start: 2022-06-17 — End: 2023-02-10

## 2022-06-17 MED ORDER — METOPROLOL TARTRATE 50 MG PO TABS: 50.0000 mg | ORAL_TABLET | Freq: Two times a day (BID) | ORAL | 1 refills | Status: DC

## 2022-06-17 NOTE — Patient Instructions (Signed)
Hip Pain The hip is the joint between the upper legs and the lower pelvis. The bones, cartilage, tendons, and muscles of your hip joint support your body and allow you to move around. Hip pain can range from a minor ache to severe pain in one or both of your hips. The pain may be felt on the inside of the hip joint near the groin, or on the outside near the buttocks and upper thigh. You may also have swelling or stiffness in your hip area. Follow these instructions at home: Managing pain, stiffness, and swelling     If told, put ice on the painful area. Put ice in a plastic bag. Place a towel between your skin and the bag. Leave the ice on for 20 minutes, 2-3 times a day. If told, apply heat to the affected area as often as told by your health care provider. Use the heat source that your provider recommends, such as a moist heat pack or a heating pad. Place a towel between your skin and the heat source. Leave the heat on for 20-30 minutes. If your skin turns bright red, remove the ice or heat right away to prevent skin damage. The risk of damage is higher if you cannot feel pain, heat, or cold. Activity Do exercises as told by your provider. Avoid activities that cause pain. General instructions  Take over-the-counter and prescription medicines only as told by your provider. Keep a journal of your symptoms. Write down: How often you have hip pain. The location of your pain. What the pain feels like. What makes the pain worse. Sleep with a pillow between your legs on your most comfortable side. Keep all follow-up visits. Your provider will monitor your pain and activity. Contact a health care provider if: You cannot put weight on your leg. Your pain or swelling gets worse after a week. It gets harder to walk. You have a fever. Get help right away if: You fall. You have a sudden increase in pain and swelling in your hip. Your hip is red or swollen or very tender to touch. This  information is not intended to replace advice given to you by your health care provider. Make sure you discuss any questions you have with your health care provider. Document Revised: 09/28/2021 Document Reviewed: 09/28/2021 Elsevier Patient Education  2023 Elsevier Inc.  

## 2022-06-17 NOTE — Addendum Note (Signed)
Addended by: Bennie Pierini on: 06/17/2022 12:29 PM   Modules accepted: Orders

## 2022-06-17 NOTE — Progress Notes (Signed)
Subjective:    Patient ID: [name], female    DOB: 1950-11-21, 72 y.o.   MRN: [id number]   Chief Complaint: Medical Management of Chronic Issues, Hyperlipidemia, and Hip Pain (Left. Steroids and injection did not help)    HPI:  Ashley [name] is a 72 y.o. who identifies as a female who was assigned female at birth.   Social history: Lives with: her grand daughter lives with her Work history: drives a school bus   Comes in today for follow up of the following chronic medical issues:  1. Primary hypertension No c/o chest pain, sob or headache. Does not check blood pressure at home. BP Readings from Last 3 Encounters:  06/17/22 131/67  05/12/22 (!) 148/64  03/21/22 (!) 151/82     2. Mixed hyperlipidemia Does not watch diet and does no dedicated exercise. Lab Results  Component Value Date   CHOL 134 12/06/2021   HDL 42 12/06/2021   LDLCALC 67 12/06/2021   TRIG 145 12/06/2021   CHOLHDL 3.2 12/06/2021     3. Type 2 diabetes mellitus with other specified complication, without long-term current use of insulin (HCC) Does not check blood sugars at home very often Lab Results  Component Value Date   HGBA1C 7.0 (H) 12/06/2021     4. GAD (generalized anxiety disorder) Is on xanax as needed    06/17/2022   10:53 AM 05/12/2022   10:49 AM 12/06/2021    9:09 AM 08/31/2021   10:52 AM  GAD 7 : Generalized Anxiety Score  Nervous, Anxious, on Edge 3 2 2  0  Control/stop worrying 3 0 1 0  Worry too much - different things 3 0 1 0  Trouble relaxing 0 0 1 0  Restless 0 0 1 0  Easily annoyed or irritable 3 0 2 0  Afraid - awful might happen 0 0 2 0  Total GAD 7 Score 12 2 10  0  Anxiety Difficulty Somewhat difficult Not difficult at all Not difficult at all Not difficult at all      5. Persistent atrial fibrillation (HCC) No palpitations or heart racing  6. Peripheral edema Has occasional peripheral edema  7. Severe obesity (BMI >= 40) (HCC) No recent weight  changes Wt Readings from Last 3 Encounters:  06/17/22 254 lb (115.2 kg)  05/12/22 261 lb (118.4 kg)  03/21/22 257 lb 6.4 oz (116.8 kg)   BMI Readings from Last 3 Encounters:  06/17/22 44.99 kg/m  05/12/22 46.23 kg/m  03/21/22 45.60 kg/m     8. Systolic murmur No issues   New complaints: None today  Allergies  Allergen Reactions   Ciprofloxacin Hcl Hives   Outpatient Encounter Medications as of 06/17/2022  Medication Sig   Accu-Chek Softclix Lancets lancets TEST BLOOD SUGAR TWICE DAILY   acetaminophen (TYLENOL) 500 MG tablet Take 1,000 mg by mouth every 6 (six) hours as needed (for pain/headaches.).   albuterol (VENTOLIN HFA) 108 (90 Base) MCG/ACT inhaler INHALE 2 PUFFS INTO THE LUNGS EVERY 6 HOURS AS NEEDED FOR WHEEZE OR SHORTNESS OF BREATH   Alcohol Swabs (DROPSAFE ALCOHOL PREP) 70 % PADS CHECK BLOOD SUGAR TWICE DAILY   ALPRAZolam (XANAX) 0.25 MG tablet Take 1 tablet (0.25 mg total) by mouth daily as needed for anxiety.   amLODipine (NORVASC) 5 MG tablet Take 1 tablet (5 mg total) by mouth daily.   apixaban (ELIQUIS) 5 MG TABS tablet Take 1 tablet (5 mg total) by mouth 2 (two) times daily.   Ascorbic Acid (VITAMIN  C) 1000 MG tablet Take 1,000 mg by mouth daily.   Blood Glucose Monitoring Suppl (ACCU-CHEK GUIDE ME) w/Device KIT TEST BLOOD SUGAR TWICE DAILY Dx E11.9   Cholecalciferol (VITAMIN D3) 1000 units CAPS Take 1 capsule by mouth daily.   CINNAMON PO Take 1 capsule by mouth daily.    cloNIDine (CATAPRES) 0.2 MG tablet TAKE 1 TABLET TWICE DAILY   furosemide (LASIX) 20 MG tablet TAKE 1 TABLET EVERY DAY   Ginger, Zingiber officinalis, (GINGER ROOT PO) Take 1 capsule by mouth daily.    glipiZIDE (GLUCOTROL) 5 MG tablet TAKE 1 TABLET TWICE DAILY   glucose blood (ACCU-CHEK AVIVA PLUS) test strip TEST BLOOD SUGAR TWICE DAILY   lisinopril (ZESTRIL) 40 MG tablet Take 1 tablet (40 mg total) by mouth daily.   metFORMIN (GLUCOPHAGE) 1000 MG tablet TAKE 1 TABLET TWICE DAILY WITH  MEALS   metoprolol tartrate (LOPRESSOR) 50 MG tablet Take 1 tablet (50 mg total) by mouth 2 (two) times daily.   Multiple Vitamins-Minerals (ZINC PO) Take 1 tablet by mouth daily.   Omega-3 Fatty Acids (FISH OIL) 1000 MG CAPS Take 1 capsule by mouth daily.   simvastatin (ZOCOR) 40 MG tablet Take 1 tablet (40 mg total) by mouth every evening.   Turmeric Curcumin 500 MG CAPS Take 500 mg by mouth daily.    zolpidem (AMBIEN) 5 MG tablet TAKE 1 TABLET AT BEDTIME FOR SLEEP   [DISCONTINUED] predniSONE (DELTASONE) 20 MG tablet Take 2 tablets daily for 5 days   No facility-administered encounter medications on file as of 06/17/2022.    Past Surgical History:  Procedure Laterality Date   CYST EXCISION Left 03/15/2017   from buttocks   ORIF ANKLE FRACTURE Left 04/20/2017   Procedure: OPEN REDUCTION INTERNAL FIXATION (ORIF) LEFT ANKLE FRACTURE;  Surgeon: Beverely Low, MD;  Location: [facility] OR;  Service: Orthopedics;  Laterality: Left;    Family History  Problem Relation Age of Onset   Diabetes Mother    CAD Mother        CABG at age 82   Hypertension Mother    Hyperlipidemia Mother    Diabetes Father    Heart disease Father        CHF   Hypertension Father    Diabetes Sister    CAD Sister    Hypertension Sister    Diabetes Brother    Hypertension Brother    Healthy Sister       Controlled substance contract: n/a     Review of Systems  Constitutional:  Negative for diaphoresis.  Eyes:  Negative for pain.  Respiratory:  Negative for shortness of breath.   Cardiovascular:  Negative for chest pain, palpitations and leg swelling.  Gastrointestinal:  Negative for abdominal pain.  Endocrine: Negative for polydipsia.  Skin:  Negative for rash.  Neurological:  Negative for dizziness, weakness and headaches.  Hematological:  Does not bruise/bleed easily.  All other systems reviewed and are negative.      Objective:   Physical Exam Vitals and nursing note reviewed.  Constitutional:       General: She is not in acute distress.    Appearance: Normal appearance. She is well-developed.  HENT:     Head: Normocephalic.     Right Ear: Tympanic membrane normal.     Left Ear: Tympanic membrane normal.     Nose: Nose normal.     Mouth/Throat:     Mouth: Mucous membranes are moist.  Eyes:     Pupils: Pupils are  equal, round, and reactive to light.  Neck:     Vascular: No carotid bruit or JVD.  Cardiovascular:     Rate and Rhythm: Normal rate and regular rhythm.     Heart sounds: Normal heart sounds.  Pulmonary:     Effort: Pulmonary effort is normal. No respiratory distress.     Breath sounds: Normal breath sounds. No wheezing or rales.  Chest:     Chest wall: No tenderness.  Abdominal:     General: Bowel sounds are normal. There is no distension or abdominal bruit.     Palpations: Abdomen is soft. There is no hepatomegaly, splenomegaly, mass or pulsatile mass.     Tenderness: There is no abdominal tenderness.  Musculoskeletal:        General: Normal range of motion.     Cervical back: Normal range of motion and neck supple.     Comments: Hip pain with weight bearing  Lymphadenopathy:     Cervical: No cervical adenopathy.  Skin:    General: Skin is warm and dry.  Neurological:     General: No focal deficit present.     Mental Status: She is alert and oriented to person, place, and time.     Deep Tendon Reflexes: Reflexes are normal and symmetric.  Psychiatric:        Behavior: Behavior normal.        Thought Content: Thought content normal.        Judgment: Judgment normal.    BP 131/67   Pulse (!) 54   Temp (!) 97.4 F (36.3 C)   Ht 5\' 3"  (1.6 m)   Wt 254 lb (115.2 kg)   SpO2 96%   BMI 44.99 kg/m         Assessment & Plan:  Ashley [name] comes in today with chief complaint of Medical Management of Chronic Issues, Hyperlipidemia, and Hip Pain (Left. Steroids and injection did not help)   Diagnosis and orders addressed:  1. Hypertension  associated with diabetes (HCC) Low sodium diet - CBC with Differential/Platelet - CMP14+EGFR - amLODipine (NORVASC) 5 MG tablet; Take 1 tablet (5 mg total) by mouth daily.  Dispense: 90 tablet; Refill: 1 - cloNIDine (CATAPRES) 0.2 MG tablet; Take 1 tablet (0.2 mg total) by mouth 2 (two) times daily.  Dispense: 180 tablet; Refill: 1 - lisinopril (ZESTRIL) 40 MG tablet; Take 1 tablet (40 mg total) by mouth daily.  Dispense: 90 tablet; Refill: 1  2. Mixed hyperlipidemia Low fat diet - Lipid panel - simvastatin (ZOCOR) 40 MG tablet; Take 1 tablet (40 mg total) by mouth every evening.  Dispense: 90 tablet; Refill: 1  3. Type 2 diabetes mellitus with other specified complication, without long-term current use of insulin (HCC) Continue to watch carbs in diet - Bayer DCA Hb A1c Waived  4. GAD (generalized anxiety disorder) Stress management - ALPRAZolam (XANAX) 0.25 MG tablet; Take 1 tablet (0.25 mg total) by mouth daily as needed for anxiety.  Dispense: 30 tablet; Refill: 5  5. Persistent atrial fibrillation (HCC) Avoid caffeine - metoprolol tartrate (LOPRESSOR) 50 MG tablet; Take 1 tablet (50 mg total) by mouth 2 (two) times daily.  Dispense: 180 tablet; Refill: 1 - apixaban (ELIQUIS) 5 MG TABS tablet; Take 1 tablet (5 mg total) by mouth 2 (two) times daily.  Dispense: 180 tablet; Refill: 3  6. Peripheral edema Elevate legs when sitting - furosemide (LASIX) 20 MG tablet; Take 1 tablet (20 mg total) by mouth daily.  Dispense: 90 tablet;  Refill: 1  7. Severe obesity (BMI >= 40) (HCC) Discussed diet and exercise for person with BMI >25 Will recheck weight in 3-6 months   8. Systolic murmur  9. Type 2 diabetes mellitus without complication, without long-term current use of insulin (HCC) - glipiZIDE (GLUCOTROL) 5 MG tablet; Take 1 tablet (5 mg total) by mouth 2 (two) times daily.  Dispense: 180 tablet; Refill: 1 - metFORMIN (GLUCOPHAGE) 1000 MG tablet; Take 1 tablet (1,000 mg total) by  mouth 2 (two) times daily with a meal.  Dispense: 180 tablet; Refill: 1  10. Pain of left hip Ortho referral - celecoxib (CELEBREX) 200 MG capsule; Take 1 capsule (200 mg total) by mouth 2 (two) times daily.  Dispense: 60 capsule; Refill: 3   Labs pending Health Maintenance reviewed Diet and exercise encouraged  Follow up plan: 3 months   Mary-Margaret [name], FNP

## 2022-06-18 LAB — CBC WITH DIFFERENTIAL/PLATELET
Basophils Absolute: 0 10*3/uL (ref 0.0–0.2)
Basos: 1 %
EOS (ABSOLUTE): 0.1 10*3/uL (ref 0.0–0.4)
Eos: 1 %
Hematocrit: 40.1 % (ref 34.0–46.6)
Hemoglobin: 13.3 g/dL (ref 11.1–15.9)
Immature Grans (Abs): 0.1 10*3/uL (ref 0.0–0.1)
Immature Granulocytes: 1 %
Lymphocytes Absolute: 2 10*3/uL (ref 0.7–3.1)
Lymphs: 27 %
MCH: 31.7 pg (ref 26.6–33.0)
MCHC: 33.2 g/dL (ref 31.5–35.7)
MCV: 96 fL (ref 79–97)
Monocytes Absolute: 0.6 10*3/uL (ref 0.1–0.9)
Monocytes: 8 %
Neutrophils Absolute: 4.7 10*3/uL (ref 1.4–7.0)
Neutrophils: 62 %
Platelets: 192 10*3/uL (ref 150–450)
RBC: 4.19 x10E6/uL (ref 3.77–5.28)
RDW: 13.2 % (ref 11.7–15.4)
WBC: 7.5 10*3/uL (ref 3.4–10.8)

## 2022-06-18 LAB — CMP14+EGFR
ALT: 19 IU/L (ref 0–32)
AST: 24 IU/L (ref 0–40)
Albumin/Globulin Ratio: 1.4 (ref 1.2–2.2)
Albumin: 3.7 g/dL — ABNORMAL LOW (ref 3.9–4.9)
Alkaline Phosphatase: 51 IU/L (ref 44–121)
BUN/Creatinine Ratio: 31 — ABNORMAL HIGH (ref 12–28)
BUN: 21 mg/dL (ref 8–27)
Bilirubin Total: 0.6 mg/dL (ref 0.0–1.2)
CO2: 25 mmol/L (ref 20–29)
Calcium: 9.1 mg/dL (ref 8.7–10.3)
Chloride: 100 mmol/L (ref 96–106)
Creatinine, Ser: 0.68 mg/dL (ref 0.57–1.00)
Globulin, Total: 2.6 g/dL (ref 1.5–4.5)
Glucose: 200 mg/dL — ABNORMAL HIGH (ref 70–99)
Potassium: 4.3 mmol/L (ref 3.5–5.2)
Sodium: 141 mmol/L (ref 134–144)
Total Protein: 6.3 g/dL (ref 6.0–8.5)
eGFR: 94 mL/min/{1.73_m2} (ref >59)

## 2022-06-18 LAB — LIPID PANEL
Chol/HDL Ratio: 2.6 ratio (ref 0.0–4.4)
Cholesterol, Total: 127 mg/dL (ref 100–199)
HDL: 49 mg/dL (ref >39)
LDL Chol Calc (NIH): 54 mg/dL (ref 0–99)
Triglycerides: 138 mg/dL (ref 0–149)

## 2022-07-05 ENCOUNTER — Other Ambulatory Visit: Payer: Self-pay

## 2022-07-05 MED ORDER — ALBUTEROL SULFATE HFA 108 (90 BASE) MCG/ACT IN AERS: INHALATION_SPRAY | RESPIRATORY_TRACT | 1 refills | Status: DC

## 2022-07-18 ENCOUNTER — Ambulatory Visit (INDEPENDENT_AMBULATORY_CARE_PROVIDER_SITE_OTHER): Payer: Medicare HMO | Admitting: Orthopedic Surgery

## 2022-07-18 ENCOUNTER — Other Ambulatory Visit (INDEPENDENT_AMBULATORY_CARE_PROVIDER_SITE_OTHER): Payer: Medicare HMO

## 2022-07-18 ENCOUNTER — Encounter: Payer: Self-pay | Admitting: Orthopedic Surgery

## 2022-07-18 ENCOUNTER — Telehealth: Payer: Self-pay | Admitting: Radiology

## 2022-07-18 VITALS — BP 136/73 | HR 62 | Ht 62.0 in | Wt 257.0 lb

## 2022-07-18 DIAGNOSIS — M545 Low back pain, unspecified: Secondary | ICD-10-CM

## 2022-07-18 DIAGNOSIS — M4316 Spondylolisthesis, lumbar region: Secondary | ICD-10-CM

## 2022-07-18 NOTE — Addendum Note (Signed)
Addended by: Michaele Offer on: 07/18/2022 12:19 PM   Modules accepted: Orders

## 2022-07-18 NOTE — Telephone Encounter (Signed)
Called patient told her to call PT in [place] to schedule [phone] left message

## 2022-07-18 NOTE — Progress Notes (Signed)
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  She was having some left hip and left leg pain but has improved significantly and has regained most of her function but still cannot do most of her outdoor activities    Problem list, medical hx, medications and allergies reviewed    Physical Exam Vitals and nursing note reviewed.  Constitutional:      Appearance: Normal appearance.  HENT:     Head: Normocephalic and atraumatic.  Eyes:     General: No scleral icterus.       Right eye: No discharge.        Left eye: No discharge.     Extraocular Movements: Extraocular movements intact.     Conjunctiva/sclera: Conjunctivae normal.     Pupils: Pupils are equal, round, and reactive to light.  Cardiovascular:     Rate and Rhythm: Normal rate.     Pulses: Normal pulses.  Skin:    General: Skin is warm and dry.     Capillary Refill: Capillary refill takes less than 2 seconds.  Neurological:     General: No focal deficit present.     Mental Status: She is alert and oriented to person, place, and time.  Psychiatric:        Mood and Affect: Mood normal.        Behavior: Behavior normal.        Thought Content: Thought content normal.        Judgment: Judgment normal.    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

## 2022-07-25 ENCOUNTER — Ambulatory Visit (INDEPENDENT_AMBULATORY_CARE_PROVIDER_SITE_OTHER): Payer: Medicare HMO

## 2022-07-25 VITALS — Ht 62.0 in | Wt 256.0 lb

## 2022-07-25 DIAGNOSIS — Z0000 Encounter for general adult medical examination without abnormal findings: Secondary | ICD-10-CM | POA: Diagnosis not present

## 2022-07-25 NOTE — Patient Instructions (Signed)
Ashley [name] , Thank you for taking time to come for your Medicare Wellness Visit. I appreciate your ongoing commitment to your health goals. Please review the following plan we discussed and let me know if I can assist you in the future.   These are the goals we discussed:  Goals      DIET - INCREASE WATER INTAKE     Exercise 3 times per week for 30 minutes      Walking and riding stationary bicycle are great options.     Patient Stated     04/16/2019 AWV Goal: Exercise for General Health  Patient will verbalize understanding of the benefits of increased physical activity: Exercising regularly is important. It will improve your overall fitness, flexibility, and endurance. Regular exercise also will improve your overall health. It can help you control your weight, reduce stress, and improve your bone density. Over the next year, patient will increase physical activity as tolerated with a goal of at least 150 minutes of moderate physical activity per week.  You can tell that you are exercising at a moderate intensity if your heart starts beating faster and you start breathing faster but can still hold a conversation. Moderate-intensity exercise ideas include: Walking 1 mile (1.6 km) in about 15 minutes Biking Hiking Golfing Dancing Water aerobics Patient will verbalize understanding of everyday activities that increase physical activity by providing examples like the following: Yard work, such as: Insurance underwriter Gardening Washing windows or floors Patient will be able to explain general safety guidelines for exercising:  Before you start a new exercise program, talk with your health care provider. Do not exercise so much that you hurt yourself, feel dizzy, or get very short of breath. Wear comfortable clothes and wear shoes with good support. Drink plenty of water while you exercise to prevent  dehydration or heat stroke. Work out until your breathing and your heartbeat get faster.         This is a list of the screening recommended for you and due dates:  Health Maintenance  Topic Date Due   Colon Cancer Screening  03/22/2020   COVID-19 Vaccine (3 - 2023-24 season) 10/08/2021   Yearly kidney health urinalysis for diabetes  09/01/2022   Flu Shot  09/08/2022   Eye exam for diabetics  09/28/2022   DEXA scan (bone density measurement)  12/01/2022   Complete foot exam   12/07/2022   Hemoglobin A1C  12/18/2022   Mammogram  03/01/2023   Yearly kidney function blood test for diabetes  06/17/2023   Medicare Annual Wellness Visit  07/25/2023   DTaP/Tdap/Td vaccine (2 - Td or Tdap) 08/19/2027   Pneumonia Vaccine  Completed   Hepatitis C Screening  Completed   Zoster (Shingles) Vaccine  Completed   HPV Vaccine  Aged Out    Advanced directives: In Chart   Conditions/risks identified: Aim for 30 minutes of exercise or brisk walking, 6-8 glasses of water, and 5 servings of fruits and vegetables each day.   Next appointment: Follow up in one year for your annual wellness visit    Preventive Care 65 Years and Older, Female Preventive care refers to lifestyle choices and visits with your health care provider that can promote health and wellness. What does preventive care include? A yearly physical exam. This is also called an annual well check. Dental exams once or twice a year. Routine eye exams. Ask your health  care provider how often you should have your eyes checked. Personal lifestyle choices, including: Daily care of your teeth and gums. Regular physical activity. Eating a healthy diet. Avoiding tobacco and drug use. Limiting alcohol use. Practicing safe sex. Taking low-dose aspirin every day. Taking vitamin and mineral supplements as recommended by your health care provider. What happens during an annual well check? The services and screenings done by your health  care provider during your annual well check will depend on your age, overall health, lifestyle risk factors, and family history of disease. Counseling  Your health care provider may ask you questions about your: Alcohol use. Tobacco use. Drug use. Emotional well-being. Home and relationship well-being. Sexual activity. Eating habits. History of falls. Memory and ability to understand (cognition). Work and work Astronomer. Reproductive health. Screening  You may have the following tests or measurements: Height, weight, and BMI. Blood pressure. Lipid and cholesterol levels. These may be checked every 5 years, or more frequently if you are over 25 years old. Skin check. Lung cancer screening. You may have this screening every year starting at age 28 if you have a 30-pack-year history of smoking and currently smoke or have quit within the past 15 years. Fecal occult blood test (FOBT) of the stool. You may have this test every year starting at age 83. Flexible sigmoidoscopy or colonoscopy. You may have a sigmoidoscopy every 5 years or a colonoscopy every 10 years starting at age 49. Hepatitis C blood test. Hepatitis B blood test. Sexually transmitted disease (STD) testing. Diabetes screening. This is done by checking your blood sugar (glucose) after you have not eaten for a while (fasting). You may have this done every 1-3 years. Bone density scan. This is done to screen for osteoporosis. You may have this done starting at age 65. Mammogram. This may be done every 1-2 years. Talk to your health care provider about how often you should have regular mammograms. Talk with your health care provider about your test results, treatment options, and if necessary, the need for more tests. Vaccines  Your health care provider may recommend certain vaccines, such as: Influenza vaccine. This is recommended every year. Tetanus, diphtheria, and acellular pertussis (Tdap, Td) vaccine. You may need a Td  booster every 10 years. Zoster vaccine. You may need this after age 4. Pneumococcal 13-valent conjugate (PCV13) vaccine. One dose is recommended after age 55. Pneumococcal polysaccharide (PPSV23) vaccine. One dose is recommended after age 54. Talk to your health care provider about which screenings and vaccines you need and how often you need them. This information is not intended to replace advice given to you by your health care provider. Make sure you discuss any questions you have with your health care provider. Document Released: 02/20/2015 Document Revised: 10/14/2015 Document Reviewed: 11/25/2014 Elsevier Interactive Patient Education  2017 [employer].  Fall Prevention in the Home Falls can cause injuries. They can happen to people of all ages. There are many things you can do to make your home safe and to help prevent falls. What can I do on the outside of my home? Regularly fix the edges of walkways and driveways and fix any cracks. Remove anything that might make you trip as you walk through a door, such as a raised step or threshold. Trim any bushes or trees on the path to your home. Use bright outdoor lighting. Clear any walking paths of anything that might make someone trip, such as rocks or tools. Regularly check to see if handrails  are loose or broken. Make sure that both sides of any steps have handrails. Any raised decks and porches should have guardrails on the edges. Have any leaves, snow, or ice cleared regularly. Use sand or salt on walking paths during winter. Clean up any spills in your garage right away. This includes oil or grease spills. What can I do in the bathroom? Use night lights. Install grab bars by the toilet and in the tub and shower. Do not use towel bars as grab bars. Use non-skid mats or decals in the tub or shower. If you need to sit down in the shower, use a plastic, non-slip stool. Keep the floor dry. Clean up any water that spills on the floor  as soon as it happens. Remove soap buildup in the tub or shower regularly. Attach bath mats securely with double-sided non-slip rug tape. Do not have throw rugs and other things on the floor that can make you trip. What can I do in the bedroom? Use night lights. Make sure that you have a light by your bed that is easy to reach. Do not use any sheets or blankets that are too big for your bed. They should not hang down onto the floor. Have a firm chair that has side arms. You can use this for support while you get dressed. Do not have throw rugs and other things on the floor that can make you trip. What can I do in the kitchen? Clean up any spills right away. Avoid walking on wet floors. Keep items that you use a lot in easy-to-reach places. If you need to reach something above you, use a strong step stool that has a grab bar. Keep electrical cords out of the way. Do not use floor polish or wax that makes floors slippery. If you must use wax, use non-skid floor wax. Do not have throw rugs and other things on the floor that can make you trip. What can I do with my stairs? Do not leave any items on the stairs. Make sure that there are handrails on both sides of the stairs and use them. Fix handrails that are broken or loose. Make sure that handrails are as long as the stairways. Check any carpeting to make sure that it is firmly attached to the stairs. Fix any carpet that is loose or worn. Avoid having throw rugs at the top or bottom of the stairs. If you do have throw rugs, attach them to the floor with carpet tape. Make sure that you have a light switch at the top of the stairs and the bottom of the stairs. If you do not have them, ask someone to add them for you. What else can I do to help prevent falls? Wear shoes that: Do not have high heels. Have rubber bottoms. Are comfortable and fit you well. Are closed at the toe. Do not wear sandals. If you use a stepladder: Make sure that it is  fully opened. Do not climb a closed stepladder. Make sure that both sides of the stepladder are locked into place. Ask someone to hold it for you, if possible. Clearly mark and make sure that you can see: Any grab bars or handrails. First and last steps. Where the edge of each step is. Use tools that help you move around (mobility aids) if they are needed. These include: Canes. Walkers. Scooters. Crutches. Turn on the lights when you go into a dark area. Replace any light bulbs as soon as they  burn out. Set up your furniture so you have a clear path. Avoid moving your furniture around. If any of your floors are uneven, fix them. If there are any pets around you, be aware of where they are. Review your medicines with your doctor. Some medicines can make you feel dizzy. This can increase your chance of falling. Ask your doctor what other things that you can do to help prevent falls. This information is not intended to replace advice given to you by your health care provider. Make sure you discuss any questions you have with your health care provider. Document Released: 11/20/2008 Document Revised: 07/02/2015 Document Reviewed: 02/28/2014 Elsevier Interactive Patient Education  2017 [employer].

## 2022-07-25 NOTE — Progress Notes (Signed)
Subjective:   Ashley [name] is a 72 y.o. female who presents for Medicare Annual (Subsequent) preventive examination. I connected with  Ashley [name] on 07/25/22 by a audio enabled telemedicine application and verified that I am speaking with the correct person using two identifiers.  Patient Location: Home  Provider Location: Home Office  I discussed the limitations of evaluation and management by telemedicine. The patient expressed understanding and agreed to proceed.  Review of Systems     Cardiac Risk Factors include: advanced age (>7men, >71 women);diabetes mellitus;dyslipidemia;hypertension     Objective:    Today's Vitals   07/25/22 1034  Weight: 256 lb (116.1 kg)  Height: 5\' 2"  (1.575 m)   Body mass index is 46.82 kg/m.     07/25/2022   10:38 AM 07/19/2021   12:12 PM 04/16/2019   10:28 AM 04/13/2018   11:45 AM 04/20/2017    9:53 AM  Advanced Directives  Does Patient Have a Medical Advance Directive? Yes Yes Yes Yes No  Type of Estate agent of [place];Living will Healthcare Power of [place];Living will Healthcare Power of [place];Living will Healthcare Power of [place];Living will   Does patient want to make changes to medical advance directive? No - Patient declined  No - Patient declined No - Patient declined   Copy of Healthcare Power of Attorney in Chart? Yes - validated most recent copy scanned in chart (See row information) Yes - validated most recent copy scanned in chart (See row information) Yes - validated most recent copy scanned in chart (See row information) No - copy requested   Would patient like information on creating a medical advance directive?     No - Patient declined    Current Medications (verified) Outpatient Encounter Medications as of 07/25/2022  Medication Sig   Accu-Chek Softclix Lancets lancets TEST BLOOD SUGAR TWICE DAILY   acetaminophen (TYLENOL) 500 MG tablet Take 1,000 mg by mouth every 6 (six) hours as needed (for  pain/headaches.).   albuterol (VENTOLIN HFA) 108 (90 Base) MCG/ACT inhaler INHALE 2 PUFFS INTO THE LUNGS EVERY 6 HOURS AS NEEDED FOR WHEEZE OR SHORTNESS OF BREATH   Alcohol Swabs (DROPSAFE ALCOHOL PREP) 70 % PADS CHECK BLOOD SUGAR TWICE DAILY   ALPRAZolam (XANAX) 0.25 MG tablet Take 1 tablet (0.25 mg total) by mouth daily as needed for anxiety.   amLODipine (NORVASC) 5 MG tablet Take 1 tablet (5 mg total) by mouth daily.   apixaban (ELIQUIS) 5 MG TABS tablet Take 1 tablet (5 mg total) by mouth 2 (two) times daily.   Ascorbic Acid (VITAMIN C) 1000 MG tablet Take 1,000 mg by mouth daily.   Blood Glucose Monitoring Suppl (ACCU-CHEK GUIDE ME) w/Device KIT TEST BLOOD SUGAR TWICE DAILY Dx E11.9   celecoxib (CELEBREX) 200 MG capsule Take 1 capsule (200 mg total) by mouth 2 (two) times daily.   Cholecalciferol (VITAMIN D3) 1000 units CAPS Take 1 capsule by mouth daily.   CINNAMON PO Take 1 capsule by mouth daily.    cloNIDine (CATAPRES) 0.2 MG tablet Take 1 tablet (0.2 mg total) by mouth 2 (two) times daily.   furosemide (LASIX) 20 MG tablet Take 1 tablet (20 mg total) by mouth daily.   Ginger, Zingiber officinalis, (GINGER ROOT PO) Take 1 capsule by mouth daily.    glipiZIDE (GLUCOTROL) 5 MG tablet Take 1 tablet (5 mg total) by mouth 2 (two) times daily.   glucose blood (ACCU-CHEK AVIVA PLUS) test strip TEST BLOOD SUGAR TWICE DAILY   lisinopril (  ZESTRIL) 40 MG tablet Take 1 tablet (40 mg total) by mouth daily.   metFORMIN (GLUCOPHAGE) 1000 MG tablet Take 1 tablet (1,000 mg total) by mouth 2 (two) times daily with a meal.   metoprolol tartrate (LOPRESSOR) 50 MG tablet Take 1 tablet (50 mg total) by mouth 2 (two) times daily.   Multiple Vitamins-Minerals (ZINC PO) Take 1 tablet by mouth daily.   Omega-3 Fatty Acids (FISH OIL) 1000 MG CAPS Take 1 capsule by mouth daily.   simvastatin (ZOCOR) 40 MG tablet Take 1 tablet (40 mg total) by mouth every evening.   Turmeric Curcumin 500 MG CAPS Take 500 mg by  mouth daily.    zolpidem (AMBIEN) 5 MG tablet TAKE 1 TABLET AT BEDTIME FOR SLEEP   No facility-administered encounter medications on file as of 07/25/2022.    Allergies (verified) Ciprofloxacin hcl   History: Past Medical History:  Diagnosis Date   Depression    Diabetes mellitus without complication (HCC)    Heart disease    Hyperlipidemia    Hypertension    Hypertension    Past Surgical History:  Procedure Laterality Date   CYST EXCISION Left 03/15/2017   from buttocks   ORIF ANKLE FRACTURE Left 04/20/2017   Procedure: OPEN REDUCTION INTERNAL FIXATION (ORIF) LEFT ANKLE FRACTURE;  Surgeon: Beverely Low, MD;  Location: [facility] OR;  Service: Orthopedics;  Laterality: Left;   Family History  Problem Relation Age of Onset   Diabetes Mother    CAD Mother        CABG at age 48   Hypertension Mother    Hyperlipidemia Mother    Diabetes Father    Heart disease Father        CHF   Hypertension Father    Diabetes Sister    CAD Sister    Hypertension Sister    Diabetes Brother    Hypertension Brother    Healthy Sister    Social History   Socioeconomic History   Marital status: Widowed    Spouse name: Not on file   Number of children: 2   Years of education: 12   Highest education level: High school graduate  Occupational History   Occupation: School bus Air traffic controller: Therapist, [employer]  Tobacco Use   Smoking status: Never   Smokeless tobacco: Never  Vaping Use   Vaping Use: Never used  Substance and Sexual Activity   Alcohol use: No   Drug use: No   Sexual activity: Not Currently  Other Topics Concern   Not on file  Social History Narrative   Lives alone. Driving bus part time. Daughter lives across the street.   Enjoys gardening and quilting. Physical activity limited due to ankle pain since fx in 2019   Social Determinants of Health   Financial Resource Strain: Low Risk  (07/25/2022)   Overall Financial Resource Strain (CARDIA)    Difficulty of  Paying Living Expenses: Not hard at all  Food Insecurity: No Food Insecurity (07/25/2022)   Hunger Vital Sign    Worried About Running Out of Food in the Last Year: Never true    Ran Out of Food in the Last Year: Never true  Transportation Needs: No Transportation Needs (07/25/2022)   PRAPARE - Administrator, Civil Service (Medical): No    Lack of Transportation (Non-Medical): No  Physical Activity: Insufficiently Active (07/25/2022)   Exercise Vital Sign    Days of Exercise per Week: 3 days  Minutes of Exercise per Session: 30 min  Stress: No Stress Concern Present (07/25/2022)   [employer] of Occupational Health - Occupational Stress Questionnaire    Feeling of Stress : Not at all  Social Connections: Moderately Isolated (07/25/2022)   Social Connection and Isolation Panel [NHANES]    Frequency of Communication with Friends and Family: More than three times a week    Frequency of Social Gatherings with Friends and Family: More than three times a week    Attends Religious Services: More than 4 times per year    Active Member of [employer] or Organizations: No    Attends Banker Meetings: Never    Marital Status: Widowed    Tobacco Counseling Counseling given: Not Answered   Clinical Intake:  Pre-visit preparation completed: Yes  Pain : No/denies pain     Nutritional Risks: None Diabetes: Yes CBG done?: No Did pt. bring in CBG monitor from home?: No  How often do you need to have someone help you when you read instructions, pamphlets, or other written materials from your doctor or pharmacy?: 1 - Never  Diabetic?yes Nutrition Risk Assessment:  Has the patient had any N/V/D within the last 2 months?  No  Does the patient have any non-healing wounds?  No  Has the patient had any unintentional weight loss or weight gain?  No   Diabetes:  Is the patient diabetic?  Yes  If diabetic, was a CBG obtained today?  No  Did the patient bring in their  glucometer from home?  No  How often do you monitor your CBG's? Once a week .   Financial Strains and Diabetes Management:  Are you having any financial strains with the device, your supplies or your medication? No .  Does the patient want to be seen by Chronic Care Management for management of their diabetes?  No  Would the patient like to be referred to a Nutritionist or for Diabetic Management?  No   Diabetic Exams:  Diabetic Eye Exam: Completed 08/2021 Diabetic Foot Exam: Overdue, Pt has been advised about the importance in completing this exam. Pt is scheduled for diabetic foot exam on next office visit .   Interpreter Needed?: No  Information entered by :: Renie Ora, LPN   Activities of Daily Living    07/25/2022   10:38 AM  In your present state of health, do you have any difficulty performing the following activities:  Hearing? 0  Vision? 0  Difficulty concentrating or making decisions? 0  Walking or climbing stairs? 0  Dressing or bathing? 0  Doing errands, shopping? 0  Preparing Food and eating ? N  Using the Toilet? N  In the past six months, have you accidently leaked urine? N  Do you have problems with loss of bowel control? N  Managing your Medications? N  Managing your Finances? N  Housekeeping or managing your Housekeeping? N    Patient Care Team: Bennie Pierini, FNP as PCP - General (Nurse Practitioner) Marjo Bicker, MD as PCP - Cardiology (Cardiology) Beverely Low, MD as Consulting Physician (Orthopedic Surgery) Michaelle Copas, MD as Referring Physician (Optometry) Marcelino Duster, MD as Referring Physician (Dermatology)  Indicate any recent Medical Services you may have received from other than [facility] providers in the past year (date may be approximate).     Assessment:   This is a routine wellness examination for Ashley [name].  Hearing/Vision screen Vision Screening - Comments:: Wears rx glasses - up to date  with routine eye exams  with  Dr.Lee   Dietary issues and exercise activities discussed: Current Exercise Habits: Home exercise routine, Type of exercise: walking, Time (Minutes): 30, Frequency (Times/Week): 3, Weekly Exercise (Minutes/Week): 90, Intensity: Mild, Exercise limited by: orthopedic condition(s)   Goals Addressed             This Visit's Progress    DIET - INCREASE WATER INTAKE         Depression Screen    07/25/2022   10:37 AM 06/17/2022   10:52 AM 05/12/2022   10:48 AM 12/06/2021    9:09 AM 08/31/2021   10:52 AM 07/19/2021   12:06 PM 06/16/2021    2:49 PM  PHQ 2/9 Scores  PHQ - 2 Score 0 4 0 4 0 1 0  PHQ- 9 Score 0 11 2 5  0 1 2    Fall Risk    07/25/2022   10:35 AM 06/17/2022   10:52 AM 05/12/2022   10:48 AM 12/06/2021    9:09 AM 07/19/2021   12:12 PM  Fall Risk   Falls in the past year? 0 0 0 0 0  Number falls in past yr: 0    0  Injury with Fall? 0    0  Risk for fall due to : No Fall Risks    No Fall Risks  Follow up Falls prevention discussed    Falls prevention discussed    FALL RISK PREVENTION PERTAINING TO THE HOME:  Any stairs in or around the home? Yes  If so, are there any without handrails? No  Home free of loose throw rugs in walkways, pet beds, electrical cords, etc? No  Adequate lighting in your home to reduce risk of falls? No   ASSISTIVE DEVICES UTILIZED TO PREVENT FALLS:  Life alert? No  Use of a cane, walker or w/c? Yes  Grab bars in the bathroom? No  Shower chair or bench in shower? No  Elevated toilet seat or a handicapped toilet? No       04/13/2018    1:53 PM 04/13/2018    1:33 PM  MMSE - Mini Mental State Exam  Orientation to time 5 5  Orientation to Place 5 5  Registration 3   Attention/ Calculation 4   Recall 1   Language- name 2 objects 2   Language- repeat 1   Language- follow 3 step command 3   Language- read & follow direction 1   Write a sentence 1   Copy design 1   Total score 27         07/25/2022   10:38 AM 07/19/2021   12:15 PM  04/16/2019   10:30 AM  6CIT Screen  What Year? 0 points 0 points 0 points  What month? 0 points 0 points 0 points  What time? 0 points 0 points 0 points  Count back from 20 0 points 0 points 0 points  Months in reverse 0 points 2 points 0 points  Repeat phrase 0 points 0 points 0 points  Total Score 0 points 2 points 0 points    Immunizations Immunization History  Administered Date(s) Administered   Fluad Quad(high Dose 65+) 11/02/2018, 11/24/2020   Influenza, High Dose Seasonal PF 11/07/2017   Influenza,inj,Quad PF,6+ Mos 10/31/2012, 11/14/2014, 11/03/2016   Influenza-Unspecified 10/25/2013, 11/03/2016   Moderna Sars-Covid-2 Vaccination 03/13/2019, 04/10/2019   Pneumococcal Conjugate-13 11/02/2018   Pneumococcal Polysaccharide-23 10/31/2012, 05/03/2017   Tdap 08/18/2017   Zoster Recombinat (Shingrix) 08/18/2017, 11/07/2017  Zoster, Live 01/08/2013    TDAP status: Up to date  Flu Vaccine status: Up to date  Pneumococcal vaccine status: Up to date  Covid-19 vaccine status: Completed vaccines  Qualifies for Shingles Vaccine? Yes   Zostavax completed Yes   Shingrix Completed?: Yes  Screening Tests Health Maintenance  Topic Date Due   Colonoscopy  03/22/2020   COVID-19 Vaccine (3 - 2023-24 season) 10/08/2021   Diabetic kidney evaluation - Urine ACR  09/01/2022   INFLUENZA VACCINE  09/08/2022   OPHTHALMOLOGY EXAM  09/28/2022   DEXA SCAN  12/01/2022   FOOT EXAM  12/07/2022   HEMOGLOBIN A1C  12/18/2022   MAMMOGRAM  03/01/2023   Diabetic kidney evaluation - eGFR measurement  06/17/2023   Medicare Annual Wellness (AWV)  07/25/2023   DTaP/Tdap/Td (2 - Td or Tdap) 08/19/2027   Pneumonia Vaccine 76+ Years old  Completed   Hepatitis C Screening  Completed   Zoster Vaccines- Shingrix  Completed   HPV VACCINES  Aged Out    Health Maintenance  Health Maintenance Due  Topic Date Due   Colonoscopy  03/22/2020   COVID-19 Vaccine (3 - 2023-24 season) 10/08/2021     Colorectal cancer screening: Referral to GI placed declined . Pt aware the office will call re: appt.  Mammogram status: Completed 02/28/2022. Repeat every year  Bone Density status: Completed 11/30/2020. Results reflect: Bone density results: OSTEOPOROSIS. Repeat every 2 years.  Lung Cancer Screening: (Low Dose CT Chest recommended if Age 61-80 years, 30 pack-year currently smoking OR have quit w/in 15years.) does not qualify.   Lung Cancer Screening Referral: n/a  Additional Screening:  Hepatitis C Screening: does not qualify; Completed 12/06/2021  Vision Screening: Recommended annual ophthalmology exams for early detection of glaucoma and other disorders of the eye. Is the patient up to date with their annual eye exam?  Yes  Who is the provider or what is the name of the office in which the patient attends annual eye exams? Dr.Lee  If pt is not established with a provider, would they like to be referred to a provider to establish care? No .   Dental Screening: Recommended annual dental exams for proper oral hygiene  Community Resource Referral / Chronic Care Management: CRR required this visit?  No   CCM required this visit?  No      Plan:     I have personally reviewed and noted the following in the patient's chart:   Medical and social history Use of alcohol, tobacco or illicit drugs  Current medications and supplements including opioid prescriptions. Patient is not currently taking opioid prescriptions. Functional ability and status Nutritional status Physical activity Advanced directives List of other physicians Hospitalizations, surgeries, and ER visits in previous 12 months Vitals Screenings to include cognitive, depression, and falls Referrals and appointments  In addition, I have reviewed and discussed with patient certain preventive protocols, quality metrics, and best practice recommendations. A written personalized care plan for preventive services as  well as general preventive health recommendations were provided to patient.     Lorrene Reid, LPN   1/61/0960   Nurse Notes: none

## 2022-07-27 ENCOUNTER — Encounter: Payer: Self-pay | Admitting: Physical Therapy

## 2022-07-27 ENCOUNTER — Ambulatory Visit: Payer: Medicare HMO | Attending: Orthopedic Surgery | Admitting: Physical Therapy

## 2022-07-27 ENCOUNTER — Other Ambulatory Visit: Payer: Self-pay

## 2022-07-27 DIAGNOSIS — M545 Low back pain, unspecified: Secondary | ICD-10-CM | POA: Diagnosis not present

## 2022-07-27 DIAGNOSIS — M62830 Muscle spasm of back: Secondary | ICD-10-CM | POA: Diagnosis not present

## 2022-07-27 DIAGNOSIS — M5459 Other low back pain: Secondary | ICD-10-CM | POA: Insufficient documentation

## 2022-07-27 NOTE — Therapy (Signed)
OUTPATIENT PHYSICAL THERAPY THORACOLUMBAR EVALUATION   Patient Name: Ashley [name] MRN: [id number] DOB:17-Jun-1950, 72 y.o., female Today's Date: 07/27/2022  END OF SESSION:  PT End of Session - 07/27/22 1118     Visit Number 1    Number of Visits 10    Date for PT Re-Evaluation 08/31/22    Authorization Type FOTO.    PT Start Time [phone]    PT Stop Time 1027    PT Time Calculation (min) 48 min    Activity Tolerance Patient tolerated treatment well    Behavior During Therapy WFL for tasks assessed/performed             Past Medical History:  Diagnosis Date   Depression    Diabetes mellitus without complication (HCC)    Heart disease    Hyperlipidemia    Hypertension    Hypertension    Past Surgical History:  Procedure Laterality Date   CYST EXCISION Left 03/15/2017   from buttocks   ORIF ANKLE FRACTURE Left 04/20/2017   Procedure: OPEN REDUCTION INTERNAL FIXATION (ORIF) LEFT ANKLE FRACTURE;  Surgeon: Beverely Low, MD;  Location: [facility] OR;  Service: Orthopedics;  Laterality: Left;   Patient Active Problem List   Diagnosis Date Noted   Aortic valve stenosis, mild 03/22/2022   Persistent atrial fibrillation (HCC)    Severe obesity (BMI >= 40) (HCC) 07/30/2014   Hyperlipidemia 05/31/2010   HTN (hypertension) 05/31/2010   Diabetes (HCC) 05/31/2010   Peripheral edema 05/31/2010    REFERRING PROVIDER: Fuller Canada MD  REFERRING DIAG: Lumbar pain.  Rationale for Evaluation and Treatment: Rehabilitation  THERAPY DIAG:  Other low back pain  Muscle spasm of back  ONSET DATE: April 2024.  SUBJECTIVE:                                                                                                                                                                                           SUBJECTIVE STATEMENT: The patient presents to the clinic with c/o left-sided low back pain that came on after getting out of bed in April of this year.  The pain was initially  excruciating such that she had great difficulty walking and moving her left LE.  She is much improved and states that Celebrex has been very helpful.  Her pain does, however, prevent her from working in the yard which she would like to do.  Sitting decreases her pain.  "Up and doing stuff" increases her pain.  PERTINENT HISTORY:  Left ankle ORIF.  PAIN:  Are you having pain? Yes: NPRS scale: 4/10 Pain location: Left low back/hip. Pain description: Ache. Aggravating factors: As above.  Relieving factors: As above.  PRECAUTIONS: None  WEIGHT BEARING RESTRICTIONS: No  FALLS:  Has patient fallen in last 6 months? No  LIVING ENVIRONMENT: Lives with: lives alone (family close by). Lives in: House/apartment Has following equipment at home: None  OCCUPATION: Midwife.  PLOF: Independent with basic ADLs  PATIENT GOALS: Get out in yard.  OBJECTIVE:   DIAGNOSTIC FINDINGS:  IMPRESSION: 1. No acute fracture or dislocation. 2. Moderate left hip osteoarthritis.   Most notable is the increased lordosis in the lumbar spine with the facet arthritis starting second lumbar vertebrae and transitioning down through S1 with L4-5 grade 1 spondylolisthesis.  The disc spaces are preserved   Impression spondylosis lumbar spine with spondylolisthesis  PATIENT SURVEYS:  FOTO    POSTURE: rounded shoulders, forward head, and increased lumbar lordosis  PALPATION: Mild tenderness today over left SIJ region and upper gluteal musculature.  LUMBAR ROM:  Full active lumbar flexion and extension to 15 degrees.  LOWER EXTREMITY ROM:     WFL's.  LOWER EXTREMITY MMT:    Normal LE strength.  LUMBAR SPECIAL TESTS:  Equal leg lengths. (-) SLR and FABER testing.   Normal Patellar reflexes and unable to elicit Achilles reflexes.     GAIT: WNL.  TODAY'S TREATMENT:                                                                                                                              DATE:  HMP and IFC at 80-150 Hz on 40% scan x 20 minutes to left LB/SIJ/upper gluteal region.  Normal modality response following removal of modality.   PATIENT EDUCATION:  Education details: Discussed lumbar exercise progression. Person educated: Patient Education method: Explanation Education comprehension: verbalized understanding  HOME EXERCISE PROGRAM:   ASSESSMENT:  CLINICAL IMPRESSION: The patient presents to OPPT with c/o left-sided low back pain that has improved significantly since April.  She has some remaining left-sided low back and upper gluteal pain that can increase with increased activities and prevents her from working in her yard.  She has normal LE strength.  Special testing is negative.  She exhibited full active lumbar flexion.  Patient will benefit from skilled physical therapy intervention to address pain and deficits.  OBJECTIVE IMPAIRMENTS: decreased activity tolerance, decreased ROM, increased muscle spasms, postural dysfunction, and pain.   ACTIVITY LIMITATIONS: lifting  PARTICIPATION LIMITATIONS: meal prep, cleaning, laundry, and yard work  [facility] POTENTIAL: Excellent  CLINICAL DECISION MAKING: Stable/uncomplicated  EVALUATION COMPLEXITY: Low   GOALS:  SHORT TERM GOALS: Target date: 08/10/22  Ind with an HEP. Goal status: INITIAL    LONG TERM GOALS: Target date: 08/31/22  Perform ADL's with pain not > 2-3/10.  Goal status: INITIAL  2.  Walk a community distance with pain not > 3/10.  Goal status: INITIAL  3.  Perform yardwork with pain not > 3/10.  Goal status: INITIAL  PLAN:  PT FREQUENCY: 2x/week  PT DURATION: other: 5 weeks.  PLANNED INTERVENTIONS: Therapeutic exercises, Therapeutic activity, Patient/Family education, Self Care, Dry Needling, Electrical stimulation, Cryotherapy, Moist heat, Ultrasound, and Manual therapy.  PLAN FOR NEXT SESSION: Combo e'stim/US, STW/M, Nustep, core exercise progression.   Colsen Modi, [place],  PT 07/27/2022, 11:44 AM

## 2022-08-01 ENCOUNTER — Encounter: Payer: Self-pay | Admitting: *Deleted

## 2022-08-02 ENCOUNTER — Ambulatory Visit: Payer: Medicare HMO | Admitting: Physical Therapy

## 2022-08-02 ENCOUNTER — Encounter: Payer: Self-pay | Admitting: Physical Therapy

## 2022-08-02 DIAGNOSIS — M62830 Muscle spasm of back: Secondary | ICD-10-CM

## 2022-08-02 DIAGNOSIS — M545 Low back pain, unspecified: Secondary | ICD-10-CM | POA: Diagnosis not present

## 2022-08-02 DIAGNOSIS — M5459 Other low back pain: Secondary | ICD-10-CM

## 2022-08-02 NOTE — Therapy (Signed)
OUTPATIENT PHYSICAL THERAPY THORACOLUMBAR TREATMENT   Patient Name: Ashley [name] MRN: [id number] DOB:September 02, 1950, 72 y.o., female Today's Date: 08/02/2022  END OF SESSION:  PT End of Session - 08/02/22 0847     Visit Number 2    Number of Visits 10    Date for PT Re-Evaluation 08/31/22    Authorization Type FOTO.    PT Start Time [id number]    PT Stop Time 0933    PT Time Calculation (min) 47 min    Activity Tolerance Patient tolerated treatment well    Behavior During Therapy [facility] for tasks assessed/performed            Past Medical History:  Diagnosis Date   Depression    Diabetes mellitus without complication (HCC)    Heart disease    Hyperlipidemia    Hypertension    Hypertension    Past Surgical History:  Procedure Laterality Date   CYST EXCISION Left 03/15/2017   from buttocks   ORIF ANKLE FRACTURE Left 04/20/2017   Procedure: OPEN REDUCTION INTERNAL FIXATION (ORIF) LEFT ANKLE FRACTURE;  Surgeon: Beverely Low, MD;  Location: [facility] OR;  Service: Orthopedics;  Laterality: Left;   Patient Active Problem List   Diagnosis Date Noted   Aortic valve stenosis, mild 03/22/2022   Persistent atrial fibrillation (HCC)    Severe obesity (BMI >= 40) (HCC) 07/30/2014   Hyperlipidemia 05/31/2010   HTN (hypertension) 05/31/2010   Diabetes (HCC) 05/31/2010   Peripheral edema 05/31/2010   REFERRING PROVIDER: Fuller Canada MD  REFERRING DIAG: Lumbar pain.  Rationale for Evaluation and Treatment: Rehabilitation  THERAPY DIAG:  Other low back pain  Muscle spasm of back  ONSET DATE: April 2024.  SUBJECTIVE:                                                                                                                                                                                           SUBJECTIVE STATEMENT: Reports that she's felt pretty good since evaluation until this weekend in which she had to use her heating pad.  PERTINENT HISTORY:  Left ankle ORIF.  PAIN:   Are you having pain? Yes: NPRS scale: 4/10 Pain location: Left low back/hip. Pain description: Ache. Aggravating factors: As above. Relieving factors: As above.  PRECAUTIONS: None  PATIENT GOALS: Get out in yard.  OBJECTIVE:   DIAGNOSTIC FINDINGS:  IMPRESSION: 1. No acute fracture or dislocation. 2. Moderate left hip osteoarthritis.   Most notable is the increased lordosis in the lumbar spine with the facet arthritis starting second lumbar vertebrae and transitioning down through S1 with L4-5 grade 1 spondylolisthesis.  The disc spaces are preserved  Impression spondylosis lumbar spine with spondylolisthesis  PATIENT SURVEYS:  FOTO 38  POSTURE: rounded shoulders, forward head, and increased lumbar lordosis  PALPATION: Mild tenderness today over left SIJ region and upper gluteal musculature.  LUMBAR ROM:  Full active lumbar flexion and extension to 15 degrees.  LOWER EXTREMITY ROM: WFL's.  LOWER EXTREMITY MMT:   Normal LE strength.  LUMBAR SPECIAL TESTS:  Equal leg lengths. (-) SLR and FABER testing.   Normal Patellar reflexes and unable to elicit Achilles reflexes.    GAIT: WNL.  TODAY'S TREATMENT:                                                                                                                              DATE: 08/02/22 EXERCISE LOG  Exercise Repetitions and Resistance Comments  Nustep L3 x16 min                    Blank cell = exercise not performed today   Modalities  Date: 08/02/22 Unattended Estim: Lumbar, Pre-Mod, 15 mins, Pain Combo: Lumbar, 1.5 w/cm2, 100%, 1 mhz, 10 mins, Pain  PATIENT EDUCATION:  Education details: Discussed lumbar exercise progression. Person educated: Patient Education method: Explanation Education comprehension: verbalized understanding  HOME EXERCISE PROGRAM:  ASSESSMENT:  CLINICAL IMPRESSION: Patient presented in clinic with reports of mild discomfort of L hip since evaluation. Patient states that  the hip pain is limiting her ADLs and recreational activities such as caring for her yard and flowers. Mild tone of the superior glute region noted with palpation and combo [place] session. Normal modalities response noted following removal of the modalities.  OBJECTIVE IMPAIRMENTS: decreased activity tolerance, decreased ROM, increased muscle spasms, postural dysfunction, and pain.   ACTIVITY LIMITATIONS: lifting  PARTICIPATION LIMITATIONS: meal prep, cleaning, laundry, and yard work  [facility] POTENTIAL: Excellent  CLINICAL DECISION MAKING: Stable/uncomplicated  EVALUATION COMPLEXITY: Low  GOALS:  SHORT TERM GOALS: Target date: 08/10/22  Ind with an HEP. Goal status: IN PROGRESS  LONG TERM GOALS: Target date: 08/31/22  Perform ADL's with pain not > 2-3/10.  Goal status: IN PROGRESS  2.  Walk a community distance with pain not > 3/10.  Goal status: IN PROGRESS  3.  Perform yardwork with pain not > 3/10.  Goal status: IN PROGRESS  PLAN:  PT FREQUENCY: 2x/week  PT DURATION: other: 5 weeks.  PLANNED INTERVENTIONS: Therapeutic exercises, Therapeutic activity, Patient/Family education, Self Care, Dry Needling, Electrical stimulation, Cryotherapy, Moist heat, Ultrasound, and Manual therapy.  PLAN FOR NEXT SESSION: Combo e'stim/US, STW/M, Nustep, core exercise progression.  Marvell Fuller, PTA 08/02/2022, 9:58 AM

## 2022-08-05 ENCOUNTER — Ambulatory Visit: Payer: Medicare HMO | Admitting: Physical Therapy

## 2022-08-05 ENCOUNTER — Encounter: Payer: Self-pay | Admitting: Physical Therapy

## 2022-08-05 DIAGNOSIS — M545 Low back pain, unspecified: Secondary | ICD-10-CM | POA: Diagnosis not present

## 2022-08-05 DIAGNOSIS — M62830 Muscle spasm of back: Secondary | ICD-10-CM

## 2022-08-05 DIAGNOSIS — M5459 Other low back pain: Secondary | ICD-10-CM | POA: Diagnosis not present

## 2022-08-05 NOTE — Therapy (Signed)
OUTPATIENT PHYSICAL THERAPY THORACOLUMBAR TREATMENT   Patient Name: Ashley [name] MRN: [id number] DOB:1950-04-22, 72 y.o., female Today's Date: 08/05/2022  END OF SESSION:  PT End of Session - 08/05/22 0848     Visit Number 3    Number of Visits 10    Date for PT Re-Evaluation 08/31/22    Authorization Type FOTO.    PT Start Time 0848    PT Stop Time 0930    PT Time Calculation (min) 42 min    Activity Tolerance Patient tolerated treatment well    Behavior During Therapy WFL for tasks assessed/performed            Past Medical History:  Diagnosis Date   Depression    Diabetes mellitus without complication (HCC)    Heart disease    Hyperlipidemia    Hypertension    Hypertension    Past Surgical History:  Procedure Laterality Date   CYST EXCISION Left 03/15/2017   from buttocks   ORIF ANKLE FRACTURE Left 04/20/2017   Procedure: OPEN REDUCTION INTERNAL FIXATION (ORIF) LEFT ANKLE FRACTURE;  Surgeon: Beverely Low, MD;  Location: [facility] OR;  Service: Orthopedics;  Laterality: Left;   Patient Active Problem List   Diagnosis Date Noted   Aortic valve stenosis, mild 03/22/2022   Persistent atrial fibrillation (HCC)    Severe obesity (BMI >= 40) (HCC) 07/30/2014   Hyperlipidemia 05/31/2010   HTN (hypertension) 05/31/2010   Diabetes (HCC) 05/31/2010   Peripheral edema 05/31/2010   REFERRING PROVIDER: Fuller Canada MD  REFERRING DIAG: Lumbar pain.  Rationale for Evaluation and Treatment: Rehabilitation  THERAPY DIAG:  Other low back pain  Muscle spasm of back  ONSET DATE: April 2024.  SUBJECTIVE:                                                                                                                                                                                           SUBJECTIVE STATEMENT: Had more pain in the last few days and had to use AD for gait.   PERTINENT HISTORY:  Left ankle ORIF.  PAIN:  Are you having pain? Yes: NPRS scale: 6/10 Pain  location: Left low back/hip. Pain description: Ache. Aggravating factors: As above. Relieving factors: As above.  PRECAUTIONS: None  PATIENT GOALS: Get out in yard.  OBJECTIVE:   DIAGNOSTIC FINDINGS:  IMPRESSION: 1. No acute fracture or dislocation. 2. Moderate left hip osteoarthritis.   Most notable is the increased lordosis in the lumbar spine with the facet arthritis starting second lumbar vertebrae and transitioning down through S1 with L4-5 grade 1 spondylolisthesis.  The disc spaces are preserved   Impression spondylosis  lumbar spine with spondylolisthesis  PATIENT SURVEYS:  FOTO 38  POSTURE: rounded shoulders, forward head, and increased lumbar lordosis  PALPATION: Mild tenderness today over left SIJ region and upper gluteal musculature.  LUMBAR ROM:  Full active lumbar flexion and extension to 15 degrees.  LOWER EXTREMITY ROM: WFL's.  LOWER EXTREMITY MMT:   Normal LE strength.  LUMBAR SPECIAL TESTS:  Equal leg lengths. (-) SLR and FABER testing.   Normal Patellar reflexes and unable to elicit Achilles reflexes.    GAIT: WNL.  TODAY'S TREATMENT:                                                                                                                              DATE: 08/05/22   Manual Therapy Soft Tissue Mobilization: L glute med/max, TFL, greater trochanter, to reduce discomfort    Modalities  Date: 08/05/22 Unattended Estim: Lumbar and Hip, Pre-Mod, 15 mins, Pain Combo: Lumbar and Hip, 1.5 w/cm2, 100%, 1 mhz, 10 mins, Pain  PATIENT EDUCATION:  Education details: Discussed lumbar exercise progression. Person educated: Patient Education method: Explanation Education comprehension: verbalized understanding  HOME EXERCISE PROGRAM:  ASSESSMENT:  CLINICAL IMPRESSION: Patient presented in clinic with reports of increased pain since last treatment. Reported using an AD for gait and limiting activity due to pain. Therex was avoided today to narrow  cause of increased pain from last treatment. Patient is very tender over L greater trochanteric region. Light manual therapy completed today in efforts to control pain. Normal modalities response noted following removal of the modalities.   OBJECTIVE IMPAIRMENTS: decreased activity tolerance, decreased ROM, increased muscle spasms, postural dysfunction, and pain.   ACTIVITY LIMITATIONS: lifting  PARTICIPATION LIMITATIONS: meal prep, cleaning, laundry, and yard work  [facility] POTENTIAL: Excellent  CLINICAL DECISION MAKING: Stable/uncomplicated  EVALUATION COMPLEXITY: Low  GOALS:  SHORT TERM GOALS: Target date: 08/10/22  Ind with an HEP. Goal status: IN PROGRESS  LONG TERM GOALS: Target date: 08/31/22  Perform ADL's with pain not > 2-3/10.  Goal status: IN PROGRESS  2.  Walk a community distance with pain not > 3/10.  Goal status: IN PROGRESS  3.  Perform yardwork with pain not > 3/10.  Goal status: IN PROGRESS  PLAN:  PT FREQUENCY: 2x/week  PT DURATION: other: 5 weeks.  PLANNED INTERVENTIONS: Therapeutic exercises, Therapeutic activity, Patient/Family education, Self Care, Dry Needling, Electrical stimulation, Cryotherapy, Moist heat, Ultrasound, and Manual therapy.  PLAN FOR NEXT SESSION: Combo e'stim/US, STW/M, Nustep, core exercise progression.  Marvell Fuller, PTA 08/05/2022, 10:33 AM

## 2022-08-09 ENCOUNTER — Ambulatory Visit: Payer: Medicare HMO | Attending: Orthopedic Surgery | Admitting: Physical Therapy

## 2022-08-09 DIAGNOSIS — M62830 Muscle spasm of back: Secondary | ICD-10-CM | POA: Diagnosis not present

## 2022-08-09 DIAGNOSIS — M5459 Other low back pain: Secondary | ICD-10-CM | POA: Insufficient documentation

## 2022-08-09 NOTE — Therapy (Signed)
OUTPATIENT PHYSICAL THERAPY THORACOLUMBAR TREATMENT   Patient Name: Ashley [name] MRN: [id number] DOB:11/09/1950, 72 y.o., female Today's Date: 08/09/2022  END OF SESSION:  PT End of Session - 08/09/22 1148     Visit Number 4    Number of Visits 10    Date for PT Re-Evaluation 08/31/22    Authorization Type FOTO.    PT Start Time 0847    PT Stop Time 0944    PT Time Calculation (min) 57 min    Activity Tolerance Patient tolerated treatment well    Behavior During Therapy [facility] for tasks assessed/performed            Past Medical History:  Diagnosis Date   Depression    Diabetes mellitus without complication (HCC)    Heart disease    Hyperlipidemia    Hypertension    Hypertension    Past Surgical History:  Procedure Laterality Date   CYST EXCISION Left 03/15/2017   from buttocks   ORIF ANKLE FRACTURE Left 04/20/2017   Procedure: OPEN REDUCTION INTERNAL FIXATION (ORIF) LEFT ANKLE FRACTURE;  Surgeon: Beverely Low, MD;  Location: [facility] OR;  Service: Orthopedics;  Laterality: Left;   Patient Active Problem List   Diagnosis Date Noted   Aortic valve stenosis, mild 03/22/2022   Persistent atrial fibrillation (HCC)    Severe obesity (BMI >= 40) (HCC) 07/30/2014   Hyperlipidemia 05/31/2010   HTN (hypertension) 05/31/2010   Diabetes (HCC) 05/31/2010   Peripheral edema 05/31/2010   REFERRING PROVIDER: Fuller Canada MD  REFERRING DIAG: Lumbar pain.  Rationale for Evaluation and Treatment: Rehabilitation  THERAPY DIAG:  Other low back pain  Muscle spasm of back  ONSET DATE: April 2024.  SUBJECTIVE:                                                                                                                                                                                           SUBJECTIVE STATEMENT: Still hurting.  PERTINENT HISTORY:  Left ankle ORIF.  PAIN:  Are you having pain? Yes: NPRS scale: 6/10 Pain location: Left low back/hip. Pain description:  Ache. Aggravating factors: As above. Relieving factors: As above.  PRECAUTIONS: None  PATIENT GOALS: Get out in yard.  OBJECTIVE:   DIAGNOSTIC FINDINGS:  IMPRESSION: 1. No acute fracture or dislocation. 2. Moderate left hip osteoarthritis.   Most notable is the increased lordosis in the lumbar spine with the facet arthritis starting second lumbar vertebrae and transitioning down through S1 with L4-5 grade 1 spondylolisthesis.  The disc spaces are preserved   Impression spondylosis lumbar spine with spondylolisthesis  PATIENT SURVEYS:  FOTO 38  POSTURE: rounded shoulders,  forward head, and increased lumbar lordosis  PALPATION: Mild tenderness today over left SIJ region and upper gluteal musculature.  LUMBAR ROM:  Full active lumbar flexion and extension to 15 degrees.  LOWER EXTREMITY ROM: WFL's.  LOWER EXTREMITY MMT:   Normal LE strength.  LUMBAR SPECIAL TESTS:  Equal leg lengths. (-) SLR and FABER testing.   Normal Patellar reflexes and unable to elicit Achilles reflexes.    GAIT: WNL.  TODAY'S TREATMENT:                                                                                                                              DATE: 08/09/22:  Patient in right sdly position with pillow between knees for comfort:  Combo e'stim/US at 1.50 W/CM2 x 12 minutes to patient's left lateral hip musculature f/b STW/M x 11 minutes including ischemic release technique over her left TFL f/b HMP and IFC at 80-150 Hz on 40% scan x 20 minutes.  Normal modality response following removal of modality.  PATIENT EDUCATION:  Education details: Discussed lumbar exercise progression. Person educated: Patient Education method: Explanation Education comprehension: verbalized understanding  HOME EXERCISE PROGRAM:  ASSESSMENT:  CLINICAL IMPRESSION: Patient very tender to palpation over left TFL.  She did well with treatment today.  We discussed dry needling and she was provided with a  consent form.  OBJECTIVE IMPAIRMENTS: decreased activity tolerance, decreased ROM, increased muscle spasms, postural dysfunction, and pain.   ACTIVITY LIMITATIONS: lifting  PARTICIPATION LIMITATIONS: meal prep, cleaning, laundry, and yard work  [facility] POTENTIAL: Excellent  CLINICAL DECISION MAKING: Stable/uncomplicated  EVALUATION COMPLEXITY: Low  GOALS:  SHORT TERM GOALS: Target date: 08/10/22  Ind with an HEP. Goal status: IN PROGRESS  LONG TERM GOALS: Target date: 08/31/22  Perform ADL's with pain not > 2-3/10.  Goal status: IN PROGRESS  2.  Walk a community distance with pain not > 3/10.  Goal status: IN PROGRESS  3.  Perform yardwork with pain not > 3/10.  Goal status: IN PROGRESS  PLAN:  PT FREQUENCY: 2x/week  PT DURATION: other: 5 weeks.  PLANNED INTERVENTIONS: Therapeutic exercises, Therapeutic activity, Patient/Family education, Self Care, Dry Needling, Electrical stimulation, Cryotherapy, Moist heat, Ultrasound, and Manual therapy.  PLAN FOR NEXT SESSION: Combo e'stim/US, STW/M, Nustep, core exercise progression.  Woodson Macha, [place], PT 08/09/2022, 11:54 AM

## 2022-08-12 ENCOUNTER — Ambulatory Visit: Payer: Medicare HMO | Admitting: Physical Therapy

## 2022-08-12 DIAGNOSIS — M62830 Muscle spasm of back: Secondary | ICD-10-CM

## 2022-08-12 DIAGNOSIS — M5459 Other low back pain: Secondary | ICD-10-CM

## 2022-08-12 NOTE — Therapy (Signed)
OUTPATIENT PHYSICAL THERAPY THORACOLUMBAR TREATMENT   Patient Name: Ashley [name] MRN: [id number] DOB:07/08/1950, 72 y.o., female Today's Date: 08/12/2022  END OF SESSION:  PT End of Session - 08/12/22 0926     Visit Number 5    Number of Visits 10    Date for PT Re-Evaluation 08/31/22    Authorization Type FOTO.    PT Start Time 0845    PT Stop Time 0941    PT Time Calculation (min) 56 min    Activity Tolerance Patient tolerated treatment well    Behavior During Therapy [facility] for tasks assessed/performed            Past Medical History:  Diagnosis Date   Depression    Diabetes mellitus without complication (HCC)    Heart disease    Hyperlipidemia    Hypertension    Hypertension    Past Surgical History:  Procedure Laterality Date   CYST EXCISION Left 03/15/2017   from buttocks   ORIF ANKLE FRACTURE Left 04/20/2017   Procedure: OPEN REDUCTION INTERNAL FIXATION (ORIF) LEFT ANKLE FRACTURE;  Surgeon: Beverely Low, MD;  Location: [facility] OR;  Service: Orthopedics;  Laterality: Left;   Patient Active Problem List   Diagnosis Date Noted   Aortic valve stenosis, mild 03/22/2022   Persistent atrial fibrillation (HCC)    Severe obesity (BMI >= 40) (HCC) 07/30/2014   Hyperlipidemia 05/31/2010   HTN (hypertension) 05/31/2010   Diabetes (HCC) 05/31/2010   Peripheral edema 05/31/2010   REFERRING PROVIDER: Fuller Canada MD  REFERRING DIAG: Lumbar pain.  Rationale for Evaluation and Treatment: Rehabilitation  THERAPY DIAG:  Other low back pain  Muscle spasm of back  ONSET DATE: April 2024.  SUBJECTIVE:                                                                                                                                                                                           SUBJECTIVE STATEMENT: Walking a little better today. PERTINENT HISTORY:  Left ankle ORIF.  PAIN:  Are you having pain? Yes: NPRS scale: 5/10 Pain location: Left low back/hip. Pain  description: Ache. Aggravating factors: As above. Relieving factors: As above.  PRECAUTIONS: None  PATIENT GOALS: Get out in yard.  OBJECTIVE:   DIAGNOSTIC FINDINGS:  IMPRESSION: 1. No acute fracture or dislocation. 2. Moderate left hip osteoarthritis.   Most notable is the increased lordosis in the lumbar spine with the facet arthritis starting second lumbar vertebrae and transitioning down through S1 with L4-5 grade 1 spondylolisthesis.  The disc spaces are preserved   Impression spondylosis lumbar spine with spondylolisthesis  PATIENT SURVEYS:  FOTO 38  POSTURE:  rounded shoulders, forward head, and increased lumbar lordosis  PALPATION: Mild tenderness today over left SIJ region and upper gluteal musculature.  LUMBAR ROM:  Full active lumbar flexion and extension to 15 degrees.  LOWER EXTREMITY ROM: WFL's.  LOWER EXTREMITY MMT:   Normal LE strength.  LUMBAR SPECIAL TESTS:  Equal leg lengths. (-) SLR and FABER testing.   Normal Patellar reflexes and unable to elicit Achilles reflexes.    GAIT: WNL.  TODAY'S TREATMENT:                                                                                                                              DATE: 08/12/22:  Nustep level 3 x 17 minutes f/b  STW/M x 7 minutes including ischemic release technique over her left TFL f/b HMP and IFC at 80-150 Hz on 40% scan x 20 minutes.  Normal modality response following removal of modality.  PATIENT EDUCATION:  Education details: Discussed lumbar exercise progression. Person educated: Patient Education method: Explanation Education comprehension: verbalized understanding  HOME EXERCISE PROGRAM:  ASSESSMENT:  CLINICAL IMPRESSION: Patient with some decrease in pain today and feels she is walking better.  We discussed dry needling today with plans to proceed with it next visit.  OBJECTIVE IMPAIRMENTS: decreased activity tolerance, decreased ROM, increased muscle spasms, postural  dysfunction, and pain.   ACTIVITY LIMITATIONS: lifting  PARTICIPATION LIMITATIONS: meal prep, cleaning, laundry, and yard work  [facility] POTENTIAL: Excellent  CLINICAL DECISION MAKING: Stable/uncomplicated  EVALUATION COMPLEXITY: Low  GOALS:  SHORT TERM GOALS: Target date: 08/10/22  Ind with an HEP. Goal status: IN PROGRESS  LONG TERM GOALS: Target date: 08/31/22  Perform ADL's with pain not > 2-3/10.  Goal status: IN PROGRESS  2.  Walk a community distance with pain not > 3/10.  Goal status: IN PROGRESS  3.  Perform yardwork with pain not > 3/10.  Goal status: IN PROGRESS  PLAN:  PT FREQUENCY: 2x/week  PT DURATION: other: 5 weeks.  PLANNED INTERVENTIONS: Therapeutic exercises, Therapeutic activity, Patient/Family education, Self Care, Dry Needling, Electrical stimulation, Cryotherapy, Moist heat, Ultrasound, and Manual therapy.  PLAN FOR NEXT SESSION: Combo e'stim/US, STW/M, Nustep, core exercise progression.  Ibraham Levi, [place], PT 08/12/2022, 10:16 AM

## 2022-08-16 ENCOUNTER — Ambulatory Visit: Payer: Medicare HMO | Admitting: Physical Therapy

## 2022-08-16 DIAGNOSIS — M62830 Muscle spasm of back: Secondary | ICD-10-CM

## 2022-08-16 DIAGNOSIS — M5459 Other low back pain: Secondary | ICD-10-CM | POA: Diagnosis not present

## 2022-08-16 NOTE — Therapy (Signed)
OUTPATIENT PHYSICAL THERAPY THORACOLUMBAR TREATMENT   Patient Name: Ashley [name] MRN: [id number] DOB:06-04-1950, 72 y.o., female Today's Date: 08/16/2022  END OF SESSION:  PT End of Session - 08/16/22 1000     Visit Number 6    Number of Visits 10    Date for PT Re-Evaluation 08/31/22    Authorization Type FOTO.    PT Start Time 0845    PT Stop Time [phone]    PT Time Calculation (min) 57 min    Activity Tolerance Patient tolerated treatment well    Behavior During Therapy [facility] for tasks assessed/performed            Past Medical History:  Diagnosis Date   Depression    Diabetes mellitus without complication (HCC)    Heart disease    Hyperlipidemia    Hypertension    Hypertension    Past Surgical History:  Procedure Laterality Date   CYST EXCISION Left 03/15/2017   from buttocks   ORIF ANKLE FRACTURE Left 04/20/2017   Procedure: OPEN REDUCTION INTERNAL FIXATION (ORIF) LEFT ANKLE FRACTURE;  Surgeon: Beverely Low, MD;  Location: [facility] OR;  Service: Orthopedics;  Laterality: Left;   Patient Active Problem List   Diagnosis Date Noted   Aortic valve stenosis, mild 03/22/2022   Persistent atrial fibrillation (HCC)    Severe obesity (BMI >= 40) (HCC) 07/30/2014   Hyperlipidemia 05/31/2010   HTN (hypertension) 05/31/2010   Diabetes (HCC) 05/31/2010   Peripheral edema 05/31/2010   REFERRING PROVIDER: Fuller Canada MD  REFERRING DIAG: Lumbar pain.  Rationale for Evaluation and Treatment: Rehabilitation  THERAPY DIAG:  Other low back pain  Muscle spasm of back  ONSET DATE: April 2024.  SUBJECTIVE:                                                                                                                                                                                           SUBJECTIVE STATEMENT: After last treatment is is best I've felt since starting therapy.  Walked more with less pain.  PERTINENT HISTORY:  Left ankle ORIF.  PAIN:  Are you having pain?  Yes: NPRS scale: 3-4/10 Pain location: Left low back/hip. Pain description: Ache. Aggravating factors: As above. Relieving factors: As above.  PRECAUTIONS: None  PATIENT GOALS: Get out in yard.  OBJECTIVE:   DIAGNOSTIC FINDINGS:  IMPRESSION: 1. No acute fracture or dislocation. 2. Moderate left hip osteoarthritis.   Most notable is the increased lordosis in the lumbar spine with the facet arthritis starting second lumbar vertebrae and transitioning down through S1 with L4-5 grade 1 spondylolisthesis.  The disc spaces are preserved   Impression  spondylosis lumbar spine with spondylolisthesis  PATIENT SURVEYS:  FOTO 38  POSTURE: rounded shoulders, forward head, and increased lumbar lordosis  PALPATION: Mild tenderness today over left SIJ region and upper gluteal musculature.  LUMBAR ROM:  Full active lumbar flexion and extension to 15 degrees.  LOWER EXTREMITY ROM: WFL's.  LOWER EXTREMITY MMT:   Normal LE strength.  LUMBAR SPECIAL TESTS:  Equal leg lengths. (-) SLR and FABER testing.   Normal Patellar reflexes and unable to elicit Achilles reflexes.    GAIT: WNL.  TODAY'S TREATMENT:                                                                                                                              DATE: 08/16/22:  Nustep level 3 x 15 minutes f/b  Trigger Point Dry-Needling  Treatment instructions: Expect mild to moderate muscle soreness. S/S of pneumothorax if dry needled over a lung field, and to seek immediate medical attention should they occur. Patient verbalized understanding of these instructions and education. Patient Consent Given: Yes Education handout provided: Yes Muscles treated: Left TFL  F/b STW/M x 8 minutes including ischemic release technique over her left TFL f/b HMP and IFC at 80-150 Hz on 40% scan x 20 minutes.  Normal modality response following removal of modality.  PATIENT EDUCATION:  Education details: Discussed lumbar exercise  progression. Person educated: Patient Education method: Explanation Education comprehension: verbalized understanding  HOME EXERCISE PROGRAM:  ASSESSMENT:  CLINICAL IMPRESSION: Patient did great after last treatment and responded very favorably to dry needling to her left TFL today.    OBJECTIVE IMPAIRMENTS: decreased activity tolerance, decreased ROM, increased muscle spasms, postural dysfunction, and pain.   ACTIVITY LIMITATIONS: lifting  PARTICIPATION LIMITATIONS: meal prep, cleaning, laundry, and yard work  [facility] POTENTIAL: Excellent  CLINICAL DECISION MAKING: Stable/uncomplicated  EVALUATION COMPLEXITY: Low  GOALS:  SHORT TERM GOALS: Target date: 08/10/22  Ind with an HEP. Goal status: IN PROGRESS  LONG TERM GOALS: Target date: 08/31/22  Perform ADL's with pain not > 2-3/10.  Goal status: IN PROGRESS  2.  Walk a community distance with pain not > 3/10.  Goal status: IN PROGRESS  3.  Perform yardwork with pain not > 3/10.  Goal status: IN PROGRESS  PLAN:  PT FREQUENCY: 2x/week  PT DURATION: other: 5 weeks.  PLANNED INTERVENTIONS: Therapeutic exercises, Therapeutic activity, Patient/Family education, Self Care, Dry Needling, Electrical stimulation, Cryotherapy, Moist heat, Ultrasound, and Manual therapy.  PLAN FOR NEXT SESSION: Combo e'stim/US, STW/M, Nustep, core exercise progression.  Yamir Carignan, [place], PT 08/16/2022, 10:19 AM

## 2022-08-19 ENCOUNTER — Ambulatory Visit: Payer: Medicare HMO | Admitting: Physical Therapy

## 2022-08-19 DIAGNOSIS — M62830 Muscle spasm of back: Secondary | ICD-10-CM | POA: Diagnosis not present

## 2022-08-19 DIAGNOSIS — M5459 Other low back pain: Secondary | ICD-10-CM | POA: Diagnosis not present

## 2022-08-19 NOTE — Therapy (Addendum)
OUTPATIENT PHYSICAL THERAPY THORACOLUMBAR TREATMENT   Patient Name: Dawanda Mapel MRN: [id number] DOB:Feb 17, 1950, 72 y.o., female Today's Date: 08/19/2022  END OF SESSION:  PT End of Session - 08/19/22 0958     Visit Number 7    Number of Visits 10    Date for PT Re-Evaluation 08/31/22    Authorization Type FOTO.    PT Start Time 0845    PT Stop Time [phone]    PT Time Calculation (min) 58 min    Activity Tolerance Patient tolerated treatment well    Behavior During Therapy [facility] for tasks assessed/performed             Past Medical History:  Diagnosis Date   Depression    Diabetes mellitus without complication (HCC)    Heart disease    Hyperlipidemia    Hypertension    Hypertension    Past Surgical History:  Procedure Laterality Date   CYST EXCISION Left 03/15/2017   from buttocks   ORIF ANKLE FRACTURE Left 04/20/2017   Procedure: OPEN REDUCTION INTERNAL FIXATION (ORIF) LEFT ANKLE FRACTURE;  Surgeon: Beverely Low, MD;  Location: [facility] OR;  Service: Orthopedics;  Laterality: Left;   Patient Active Problem List   Diagnosis Date Noted   Aortic valve stenosis, mild 03/22/2022   Persistent atrial fibrillation (HCC)    Severe obesity (BMI >= 40) (HCC) 07/30/2014   Hyperlipidemia 05/31/2010   HTN (hypertension) 05/31/2010   Diabetes (HCC) 05/31/2010   Peripheral edema 05/31/2010   REFERRING PROVIDER: Fuller Canada MD  REFERRING DIAG: Lumbar pain.  Rationale for Evaluation and Treatment: Rehabilitation  THERAPY DIAG:  Other low back pain  Muscle spasm of back  ONSET DATE: April 2024.  SUBJECTIVE:                                                                                                                                                                                           SUBJECTIVE STATEMENT: I feel like dry needling helped.  PERTINENT HISTORY:  Left ankle ORIF.  PAIN:  Are you having pain? Yes: NPRS scale: 4/10 Pain location: Left low  back/hip. Pain description: Ache. Aggravating factors: As above. Relieving factors: As above.  PRECAUTIONS: None  PATIENT GOALS: Get out in yard.  OBJECTIVE:   DIAGNOSTIC FINDINGS:  IMPRESSION: 1. No acute fracture or dislocation. 2. Moderate left hip osteoarthritis.   Most notable is the increased lordosis in the lumbar spine with the facet arthritis starting second lumbar vertebrae and transitioning down through S1 with L4-5 grade 1 spondylolisthesis.  The disc spaces are preserved   Impression spondylosis lumbar spine with spondylolisthesis  PATIENT SURVEYS:  FOTO  38  POSTURE: rounded shoulders, forward head, and increased lumbar lordosis  PALPATION: Mild tenderness today over left SIJ region and upper gluteal musculature.  LUMBAR ROM:  Full active lumbar flexion and extension to 15 degrees.  LOWER EXTREMITY ROM: WFL's.  LOWER EXTREMITY MMT:   Normal LE strength.  LUMBAR SPECIAL TESTS:  Equal leg lengths. (-) SLR and FABER testing.   Normal Patellar reflexes and unable to elicit Achilles reflexes.    GAIT: WNL.  TODAY'S TREATMENT:                                                                                                                              DATE: 08/19/22:  Nustep level 3 x 16 minutes f/b  Trigger Point Dry-Needling  Treatment instructions: Expect mild to moderate muscle soreness. S/S of pneumothorax if dry needled over a lung field, and to seek immediate medical attention should they occur. Patient verbalized understanding of these instructions and education. Patient Consent Given: Yes Education handout provided: Yes Muscles treated: Left TFL  F/b STW/M x 10 minutes including ischemic release technique over her left TFL f/b HMP and IFC at 80-150 Hz on 40% scan x 20 minutes.  Normal modality response following removal of modality.  PATIENT EDUCATION:  Education details: Discussed lumbar exercise progression. Person educated: Patient Education  method: Explanation Education comprehension: verbalized understanding  HOME EXERCISE PROGRAM:  ASSESSMENT:  CLINICAL IMPRESSION: Patient had a good response to dry needling last visit and today.  She states she can tell therapy is helping.  She continues to be quite tender to palpation over her left TFL.  OBJECTIVE IMPAIRMENTS: decreased activity tolerance, decreased ROM, increased muscle spasms, postural dysfunction, and pain.   ACTIVITY LIMITATIONS: lifting  PARTICIPATION LIMITATIONS: meal prep, cleaning, laundry, and yard work  [facility] POTENTIAL: Excellent  CLINICAL DECISION MAKING: Stable/uncomplicated  EVALUATION COMPLEXITY: Low  GOALS:  SHORT TERM GOALS: Target date: 08/10/22  Ind with an HEP. Goal status: IN PROGRESS  LONG TERM GOALS: Target date: 08/31/22  Perform ADL's with pain not > 2-3/10.  Goal status: IN PROGRESS  2.  Walk a community distance with pain not > 3/10.  Goal status: IN PROGRESS  3.  Perform yardwork with pain not > 3/10.  Goal status: IN PROGRESS  PLAN:  PT FREQUENCY: 2x/week  PT DURATION: other: 5 weeks.  PLANNED INTERVENTIONS: Therapeutic exercises, Therapeutic activity, Patient/Family education, Self Care, Dry Needling, Electrical stimulation, Cryotherapy, Moist heat, Ultrasound, and Manual therapy.  PLAN FOR NEXT SESSION: Combo e'stim/US, STW/M, Nustep, core exercise progression.  Darrion Macaulay, [place], PT 08/19/2022, 10:04 AM

## 2022-08-23 ENCOUNTER — Ambulatory Visit: Payer: Medicare HMO | Admitting: Physical Therapy

## 2022-08-23 DIAGNOSIS — M62830 Muscle spasm of back: Secondary | ICD-10-CM | POA: Diagnosis not present

## 2022-08-23 DIAGNOSIS — M5459 Other low back pain: Secondary | ICD-10-CM | POA: Diagnosis not present

## 2022-08-23 NOTE — Therapy (Signed)
OUTPATIENT PHYSICAL THERAPY THORACOLUMBAR TREATMENT   Patient Name: Ashley [name] MRN: [id number] DOB:10/26/1950, 72 y.o., female Today's Date: 08/23/2022  END OF SESSION:  PT End of Session - 08/23/22 0856     Visit Number 8    Number of Visits 10    Date for PT Re-Evaluation 08/31/22    PT Start Time 0845    PT Stop Time 0941    PT Time Calculation (min) 56 min    Activity Tolerance Patient tolerated treatment well    Behavior During Therapy [facility] for tasks assessed/performed             Past Medical History:  Diagnosis Date   Depression    Diabetes mellitus without complication (HCC)    Heart disease    Hyperlipidemia    Hypertension    Hypertension    Past Surgical History:  Procedure Laterality Date   CYST EXCISION Left 03/15/2017   from buttocks   ORIF ANKLE FRACTURE Left 04/20/2017   Procedure: OPEN REDUCTION INTERNAL FIXATION (ORIF) LEFT ANKLE FRACTURE;  Surgeon: Beverely Low, MD;  Location: [facility] OR;  Service: Orthopedics;  Laterality: Left;   Patient Active Problem List   Diagnosis Date Noted   Aortic valve stenosis, mild 03/22/2022   Persistent atrial fibrillation (HCC)    Severe obesity (BMI >= 40) (HCC) 07/30/2014   Hyperlipidemia 05/31/2010   HTN (hypertension) 05/31/2010   Diabetes (HCC) 05/31/2010   Peripheral edema 05/31/2010   REFERRING PROVIDER: Fuller Canada MD  REFERRING DIAG: Lumbar pain.  Rationale for Evaluation and Treatment: Rehabilitation  THERAPY DIAG:  Other low back pain  Muscle spasm of back  ONSET DATE: April 2024.  SUBJECTIVE:                                                                                                                                                                                           SUBJECTIVE STATEMENT: Dry needling is helping.  Mowed yesterday morning for about three hours.  Pain at a 2 today. PERTINENT HISTORY:  Left ankle ORIF.  PAIN:  Are you having pain? Yes: NPRS scale: 2/10 Pain  location: Left low back/hip. Pain description: Ache. Aggravating factors: As above. Relieving factors: As above.  PRECAUTIONS: None  PATIENT GOALS: Get out in yard.  OBJECTIVE:   DIAGNOSTIC FINDINGS:  IMPRESSION: 1. No acute fracture or dislocation. 2. Moderate left hip osteoarthritis.   Most notable is the increased lordosis in the lumbar spine with the facet arthritis starting second lumbar vertebrae and transitioning down through S1 with L4-5 grade 1 spondylolisthesis.  The disc spaces are preserved   Impression spondylosis lumbar spine with spondylolisthesis  PATIENT SURVEYS:  FOTO 38  POSTURE: rounded shoulders, forward head, and increased lumbar lordosis  PALPATION: Mild tenderness today over left SIJ region and upper gluteal musculature.  LUMBAR ROM:  Full active lumbar flexion and extension to 15 degrees.  LOWER EXTREMITY ROM: WFL's.  LOWER EXTREMITY MMT:   Normal LE strength.  LUMBAR SPECIAL TESTS:  Equal leg lengths. (-) SLR and FABER testing.   Normal Patellar reflexes and unable to elicit Achilles reflexes.    GAIT: WNL.  TODAY'S TREATMENT:                                                                                                                              DATE: 08/23/22:  Nustep level 3 x 16 minutes f/b  Trigger Point Dry-Needling  Treatment instructions: Expect mild to moderate muscle soreness. S/S of pneumothorax if dry needled over a lung field, and to seek immediate medical attention should they occur. Patient verbalized understanding of these instructions and education. Patient Consent Given: Yes Education handout provided: Yes Muscles treated: Left TFL  F/b STW/M x 10 minutes including ischemic release technique over her left TFL f/b HMP and IFC at 80-150 Hz on 40% scan x 20 minutes.  Normal modality response following removal of modality.  PATIENT EDUCATION:  Education details: Discussed lumbar exercise progression. Person educated:  Patient Education method: Explanation Education comprehension: verbalized understanding  HOME EXERCISE PROGRAM:  ASSESSMENT:  CLINICAL IMPRESSION: Patient responding very well to treatments.  She presented to the clinic with a lowered pain-level.  He states she notices she is getting up from chairs much easier now with less pain.  OBJECTIVE IMPAIRMENTS: decreased activity tolerance, decreased ROM, increased muscle spasms, postural dysfunction, and pain.   ACTIVITY LIMITATIONS: lifting  PARTICIPATION LIMITATIONS: meal prep, cleaning, laundry, and yard work  [facility] POTENTIAL: Excellent  CLINICAL DECISION MAKING: Stable/uncomplicated  EVALUATION COMPLEXITY: Low  GOALS:  SHORT TERM GOALS: Target date: 08/10/22  Ind with an HEP. Goal status: IN PROGRESS  LONG TERM GOALS: Target date: 08/31/22  Perform ADL's with pain not > 2-3/10.  Goal status: IN PROGRESS  2.  Walk a community distance with pain not > 3/10.  Goal status: IN PROGRESS  3.  Perform yardwork with pain not > 3/10.  Goal status: IN PROGRESS  PLAN:  PT FREQUENCY: 2x/week  PT DURATION: other: 5 weeks.  PLANNED INTERVENTIONS: Therapeutic exercises, Therapeutic activity, Patient/Family education, Self Care, Dry Needling, Electrical stimulation, Cryotherapy, Moist heat, Ultrasound, and Manual therapy.  PLAN FOR NEXT SESSION: Combo e'stim/US, STW/M, Nustep, core exercise progression.  Jla Reynolds, [place], PT 08/23/2022, 9:43 AM

## 2022-08-26 ENCOUNTER — Encounter: Payer: Self-pay | Admitting: Physical Therapy

## 2022-08-26 ENCOUNTER — Ambulatory Visit: Payer: Medicare HMO | Admitting: Physical Therapy

## 2022-08-26 DIAGNOSIS — M62830 Muscle spasm of back: Secondary | ICD-10-CM

## 2022-08-26 DIAGNOSIS — M5459 Other low back pain: Secondary | ICD-10-CM

## 2022-08-26 NOTE — Therapy (Signed)
OUTPATIENT PHYSICAL THERAPY THORACOLUMBAR TREATMENT   Patient Name: Madilyn Cephas MRN: [id number] DOB:[birth year], 72 y.o., female Today's Date: 08/26/2022  END OF SESSION:  PT End of Session - 08/26/22 0843     Visit Number 9    Number of Visits 10    Date for PT Re-Evaluation 08/31/22    Authorization Type FOTO.    PT Start Time [phone]    PT Stop Time 0932    PT Time Calculation (min) 46 min    Activity Tolerance Patient tolerated treatment well    Behavior During Therapy [facility] for tasks assessed/performed            Past Medical History:  Diagnosis Date   Depression    Diabetes mellitus without complication (HCC)    Heart disease    Hyperlipidemia    Hypertension    Hypertension    Past Surgical History:  Procedure Laterality Date   CYST EXCISION Left 03/15/2017   from buttocks   ORIF ANKLE FRACTURE Left 04/20/2017   Procedure: OPEN REDUCTION INTERNAL FIXATION (ORIF) LEFT ANKLE FRACTURE;  Surgeon: Beverely Low, MD;  Location: [facility] OR;  Service: Orthopedics;  Laterality: Left;   Patient Active Problem List   Diagnosis Date Noted   Aortic valve stenosis, mild 03/22/2022   Persistent atrial fibrillation (HCC)    Severe obesity (BMI >= 40) (HCC) 07/30/2014   Hyperlipidemia 05/31/2010   HTN (hypertension) 05/31/2010   Diabetes (HCC) 05/31/2010   Peripheral edema 05/31/2010   REFERRING PROVIDER: Fuller Canada MD  REFERRING DIAG: Lumbar pain.  Rationale for Evaluation and Treatment: Rehabilitation  THERAPY DIAG:  Other low back pain  Muscle spasm of back  ONSET DATE: April 2024.  SUBJECTIVE:                                                                                                                                                                                           SUBJECTIVE STATEMENT: Has had good relief from DN. Only uses her cane initially in the mornings.   PERTINENT HISTORY:  Left ankle ORIF.  PAIN:  Are you having pain? Yes: NPRS scale:  3-4/10 Pain location: Left low back/hip. Pain description: Ache. Aggravating factors: As above. Relieving factors: As above.  PRECAUTIONS: None  PATIENT GOALS: Get out in yard.  OBJECTIVE:   DIAGNOSTIC FINDINGS:  IMPRESSION: 1. No acute fracture or dislocation. 2. Moderate left hip osteoarthritis.  Most notable is the increased lordosis in the lumbar spine with the facet arthritis starting second lumbar vertebrae and transitioning down through S1 with L4-5 grade 1 spondylolisthesis.  The disc spaces are preserved   Impression spondylosis lumbar spine  with spondylolisthesis  PATIENT SURVEYS:  FOTO 38  POSTURE: rounded shoulders, forward head, and increased lumbar lordosis  PALPATION: Mild tenderness today over left SIJ region and upper gluteal musculature.  LUMBAR ROM:  Full active lumbar flexion and extension to 15 degrees.  LOWER EXTREMITY ROM: WFL's.  LOWER EXTREMITY MMT:   Normal LE strength.  LUMBAR SPECIAL TESTS:  Equal leg lengths. (-) SLR and FABER testing.   Normal Patellar reflexes and unable to elicit Achilles reflexes.    GAIT: WNL.  TODAY'S TREATMENT:                                                                                                                              DATE: 08/26/22  EXERCISE LOG  Exercise Repetitions and Resistance Comments  Nustep L4 x15 min                    Blank cell = exercise not performed today   Modalities  Date: 08/26/22 Unattended Estim: Hip, Pre-Mod, 15 mins, Pain Combo: Hip, 1.5 w/cm2, 100%, 1 mhz, 10 mins, Pain Hot Pack: Hip, 15 mins, Pain  PATIENT EDUCATION:  Education details: Discussed lumbar exercise progression. Person educated: Patient Education method: Explanation Education comprehension: verbalized understanding  HOME EXERCISE PROGRAM:  ASSESSMENT:  CLINICAL IMPRESSION: Patient presented in clinic with mild L hip pain especially following DN. Patient feels more independent with gait but does  use AD initially in the mornings. Patient has really benefited from DN and would like to continue. No complaints during treatment today. Normal modalities response noted following removal of the modalities.  OBJECTIVE IMPAIRMENTS: decreased activity tolerance, decreased ROM, increased muscle spasms, postural dysfunction, and pain.   ACTIVITY LIMITATIONS: lifting  PARTICIPATION LIMITATIONS: meal prep, cleaning, laundry, and yard work  [facility] POTENTIAL: Excellent  CLINICAL DECISION MAKING: Stable/uncomplicated  EVALUATION COMPLEXITY: Low  GOALS:  SHORT TERM GOALS: Target date: 08/10/22  Ind with an HEP. Goal status: IN PROGRESS  LONG TERM GOALS: Target date: 08/31/22  Perform ADL's with pain not > 2-3/10.  Goal status: IN PROGRESS  2.  Walk a community distance with pain not > 3/10.  Goal status: IN PROGRESS  3.  Perform yardwork with pain not > 3/10.  Goal status: IN PROGRESS  PLAN:  PT FREQUENCY: 2x/week  PT DURATION: other: 5 weeks.  PLANNED INTERVENTIONS: Therapeutic exercises, Therapeutic activity, Patient/Family education, Self Care, Dry Needling, Electrical stimulation, Cryotherapy, Moist heat, Ultrasound, and Manual therapy.  PLAN FOR NEXT SESSION: Combo e'stim/US, STW/M, Nustep, core exercise progression.  Marvell Fuller, PTA 08/26/2022, 9:41 AM

## 2022-09-02 ENCOUNTER — Encounter: Payer: Self-pay | Admitting: Physical Therapy

## 2022-09-02 ENCOUNTER — Ambulatory Visit: Payer: Medicare HMO | Admitting: Physical Therapy

## 2022-09-02 DIAGNOSIS — M5459 Other low back pain: Secondary | ICD-10-CM | POA: Diagnosis not present

## 2022-09-02 DIAGNOSIS — M62830 Muscle spasm of back: Secondary | ICD-10-CM

## 2022-09-02 NOTE — Therapy (Addendum)
OUTPATIENT PHYSICAL THERAPY THORACOLUMBAR TREATMENT   Patient Name: Ashley [name] MRN: [id number] DOB:04/15/1950, 72 y.o., female Today's Date: 09/02/2022  END OF SESSION:  PT End of Session - 09/02/22 0952     Visit Number 10    Number of Visits 10    Date for PT Re-Evaluation 08/31/22    Authorization Type FOTO.    PT Start Time [phone]    PT Stop Time 0935    PT Time Calculation (min) 49 min    Activity Tolerance Patient tolerated treatment well    Behavior During Therapy [facility] for tasks assessed/performed             Past Medical History:  Diagnosis Date   Depression    Diabetes mellitus without complication (HCC)    Heart disease    Hyperlipidemia    Hypertension    Hypertension    Past Surgical History:  Procedure Laterality Date   CYST EXCISION Left 03/15/2017   from buttocks   ORIF ANKLE FRACTURE Left 04/20/2017   Procedure: OPEN REDUCTION INTERNAL FIXATION (ORIF) LEFT ANKLE FRACTURE;  Surgeon: Beverely Low, MD;  Location: [facility] OR;  Service: Orthopedics;  Laterality: Left;   Patient Active Problem List   Diagnosis Date Noted   Aortic valve stenosis, mild 03/22/2022   Persistent atrial fibrillation (HCC)    Severe obesity (BMI >= 40) (HCC) 07/30/2014   Hyperlipidemia 05/31/2010   HTN (hypertension) 05/31/2010   Diabetes (HCC) 05/31/2010   Peripheral edema 05/31/2010   REFERRING PROVIDER: Fuller Canada MD  REFERRING DIAG: Lumbar pain.  Rationale for Evaluation and Treatment: Rehabilitation  THERAPY DIAG:  Other low back pain  Muscle spasm of back  ONSET DATE: April 2024.  SUBJECTIVE:                                                                                                                                                                                           SUBJECTIVE STATEMENT: Reports she has more LBP now but Nustep sometimes causes hip pain. Thinks she may be able to get up bus steps if she leads with RLE.  PERTINENT HISTORY:  Left  ankle ORIF.  PAIN:  Are you having pain? Yes: NPRS scale: 2-3/10 Pain location: Left low back/hip. Pain description: Ache. Aggravating factors: As above. Relieving factors: As above.  PRECAUTIONS: None  PATIENT GOALS: Get out in yard.  OBJECTIVE:   DIAGNOSTIC FINDINGS:  IMPRESSION: 1. No acute fracture or dislocation. 2. Moderate left hip osteoarthritis.  Most notable is the increased lordosis in the lumbar spine with the facet arthritis starting second lumbar vertebrae and transitioning down through S1 with L4-5 grade 1  spondylolisthesis.  The disc spaces are preserved   Impression spondylosis lumbar spine with spondylolisthesis  PATIENT SURVEYS:  FOTO 38  POSTURE: rounded shoulders, forward head, and increased lumbar lordosis  PALPATION: Mild tenderness today over left SIJ region and upper gluteal musculature.  LUMBAR ROM:  Full active lumbar flexion and extension to 15 degrees.  LOWER EXTREMITY ROM: WFL's.  LOWER EXTREMITY MMT:   Normal LE strength.  LUMBAR SPECIAL TESTS:  Equal leg lengths. (-) SLR and FABER testing.   Normal Patellar reflexes and unable to elicit Achilles reflexes.    GAIT: WNL.  TODAY'S TREATMENT:                                                                                                                              DATE: 09/02/22  EXERCISE LOG  Exercise Repetitions and Resistance Comments  Nustep L4 x18 min                    Blank cell = exercise not performed today   Modalities  Date: 09/02/22 Unattended Estim: Hip, IFC, 10 mins, Pain Combo: Hip, 1.5 w/cm2, 100%, 1 mhz, 10 mins, Pain  PATIENT EDUCATION:  Education details: Discussed lumbar exercise progression. Person educated: Patient Education method: Explanation Education comprehension: verbalized understanding  HOME EXERCISE PROGRAM:  ASSESSMENT:  CLINICAL IMPRESSION: Patient presented in clinic with reports of LBP but after Nustep she reported L hip pain  returned. Patient still limited with community gait and any yardwork due to pain. Patient indicates B low back and L TFL region pain. Normal modalities response noted following removal of the modalities.   09/02/22 PROGRESS REPORT: Patient is making fair progress with skilled physical therapy as evidenced by  subjective reports of reduced pain since her initial evaluation on 07/27/22. However, she has yet to meet her goals for reduced pain with community ambulation and yard work. Recommend that she continue with skilled physical therapy for four additional visits for dry needling and other interventions to address her remaining impairments to return to her prior level of function.   Candi Leash, PT, DPT   OBJECTIVE IMPAIRMENTS: decreased activity tolerance, decreased ROM, increased muscle spasms, postural dysfunction, and pain.   ACTIVITY LIMITATIONS: lifting  PARTICIPATION LIMITATIONS: meal prep, cleaning, laundry, and yard work  [facility] POTENTIAL: Excellent  CLINICAL DECISION MAKING: Stable/uncomplicated  EVALUATION COMPLEXITY: Low  GOALS:  SHORT TERM GOALS: Target date: 08/10/22  Ind with an HEP. Goal status: REVISED  LONG TERM GOALS: Target date: 08/31/22  Perform ADL's with pain not > 2-3/10.  Goal status: MET  2.  Walk a community distance with pain not > 3/10.  Goal status: NOT MET  3.  Perform yardwork with pain not > 3/10.  Goal status: NOT MET  PLAN:  PT FREQUENCY: 2x/week  PT DURATION: other: 5 weeks.  PLANNED INTERVENTIONS: Therapeutic exercises, Therapeutic activity, Patient/Family education, Self Care, Dry Needling, Electrical stimulation, Cryotherapy, Moist heat, Ultrasound, and Manual therapy.  PLAN FOR NEXT SESSION: Ask for more DN  Marvell Fuller, PTA 09/02/2022, 10:12 AM

## 2022-09-02 NOTE — Addendum Note (Signed)
Addended by: Granville Lewis on: 09/02/2022 01:06 PM   Modules accepted: Orders

## 2022-09-05 ENCOUNTER — Ambulatory Visit: Payer: Medicare HMO | Admitting: *Deleted

## 2022-09-05 ENCOUNTER — Encounter: Payer: Self-pay | Admitting: *Deleted

## 2022-09-05 DIAGNOSIS — M5459 Other low back pain: Secondary | ICD-10-CM | POA: Diagnosis not present

## 2022-09-05 DIAGNOSIS — M62830 Muscle spasm of back: Secondary | ICD-10-CM | POA: Diagnosis not present

## 2022-09-05 NOTE — Therapy (Addendum)
OUTPATIENT PHYSICAL THERAPY THORACOLUMBAR TREATMENT   Patient Name: Ashley [name] MRN: [id number] DOB:1950/05/21, 72 y.o., female Today's Date: 09/05/2022  END OF SESSION:  PT End of Session - 09/05/22 1058     Visit Number 11    Number of Visits 14    Date for PT Re-Evaluation 10/07/22    Authorization Type FOTO.    PT Start Time 1100    PT Stop Time 1159    PT Time Calculation (min) 59 min             Past Medical History:  Diagnosis Date   Depression    Diabetes mellitus without complication (HCC)    Heart disease    Hyperlipidemia    Hypertension    Hypertension    Past Surgical History:  Procedure Laterality Date   CYST EXCISION Left 03/15/2017   from buttocks   ORIF ANKLE FRACTURE Left 04/20/2017   Procedure: OPEN REDUCTION INTERNAL FIXATION (ORIF) LEFT ANKLE FRACTURE;  Surgeon: Beverely Low, MD;  Location: [facility] OR;  Service: Orthopedics;  Laterality: Left;   Patient Active Problem List   Diagnosis Date Noted   Aortic valve stenosis, mild 03/22/2022   Persistent atrial fibrillation (HCC)    Severe obesity (BMI >= 40) (HCC) 07/30/2014   Hyperlipidemia 05/31/2010   HTN (hypertension) 05/31/2010   Diabetes (HCC) 05/31/2010   Peripheral edema 05/31/2010   REFERRING PROVIDER: Fuller Canada MD  REFERRING DIAG: Lumbar pain.  Rationale for Evaluation and Treatment: Rehabilitation  THERAPY DIAG:  Other low back pain  Muscle spasm of back  ONSET DATE: April 2024.  SUBJECTIVE:                                                                                                                                                                                           SUBJECTIVE STATEMENT: Pt reports that she is doing better over all and wants DN today.  PERTINENT HISTORY:  Left ankle ORIF.  PAIN:  Are you having pain? Yes: NPRS scale: 2-3/10 Pain location: Left low back/hip. Pain description: Ache. Aggravating factors: As above. Relieving factors: As  above.  PRECAUTIONS: None  PATIENT GOALS: Get out in yard.  OBJECTIVE:   DIAGNOSTIC FINDINGS:  IMPRESSION: 1. No acute fracture or dislocation. 2. Moderate left hip osteoarthritis.  Most notable is the increased lordosis in the lumbar spine with the facet arthritis starting second lumbar vertebrae and transitioning down through S1 with L4-5 grade 1 spondylolisthesis.  The disc spaces are preserved   Impression spondylosis lumbar spine with spondylolisthesis  PATIENT SURVEYS:  FOTO 38  POSTURE: rounded shoulders, forward head, and increased lumbar lordosis  PALPATION:  Mild tenderness today over left SIJ region and upper gluteal musculature.  LUMBAR ROM:  Full active lumbar flexion and extension to 15 degrees.  LOWER EXTREMITY ROM: WFL's.  LOWER EXTREMITY MMT:   Normal LE strength.  LUMBAR SPECIAL TESTS:  Equal leg lengths. (-) SLR and FABER testing.   Normal Patellar reflexes and unable to elicit Achilles reflexes.    GAIT: WNL.  TODAY'S TREATMENT:                                                                                                                              DATE: 09/05/22  EXERCISE LOG       LT hip  Exercise Repetitions and Resistance Comments  Nustep L4 x14min                    Blank cell = exercise not performed today   Modalities  Date: 09/02/22            LT HIP Unattended Estim: Hip, IFC, 15 mins, Pain Combo: Hip, 1.5 w/cm2, 100%, 1 mhz, 10 mins, Pain DN toLT hip (TFL)from  MPT.  [place] Applegate MPT  PATIENT EDUCATION:  Education details: Discussed lumbar exercise progression. Person educated: Patient Education method: Explanation Education comprehension: verbalized understanding  HOME EXERCISE PROGRAM:  ASSESSMENT:  CLINICAL IMPRESSION: Patient presented in clinic with reports of LT hip pain and wanted  DN again. She was able to continue with some exs as well as [place] combo performed to LT hip TFL area f/b DN from MPT as well as IFC and  HMP.  Normal modalities response noted following removal of the modalities and reports decreased LT hip pain.   09/02/22 PROGRESS REPORT: Patient is making fair progress with skilled physical therapy as evidenced by  subjective reports of reduced pain since her initial evaluation on 07/27/22. However, she has yet to meet her goals for reduced pain with community ambulation and yard work. Recommend that she continue with skilled physical therapy for four additional visits for dry needling and other interventions to address her remaining impairments to return to her prior level of function.   Candi Leash, PT, DPT   OBJECTIVE IMPAIRMENTS: decreased activity tolerance, decreased ROM, increased muscle spasms, postural dysfunction, and pain.   ACTIVITY LIMITATIONS: lifting  PARTICIPATION LIMITATIONS: meal prep, cleaning, laundry, and yard work  [facility] POTENTIAL: Excellent  CLINICAL DECISION MAKING: Stable/uncomplicated  EVALUATION COMPLEXITY: Low  GOALS:  SHORT TERM GOALS: Target date: 08/10/22  Ind with an HEP. Goal status: REVISED  LONG TERM GOALS: Target date: 08/31/22  Perform ADL's with pain not > 2-3/10.  Goal status: MET  2.  Walk a community distance with pain not > 3/10.  Goal status: NOT MET  3.  Perform yardwork with pain not > 3/10.  Goal status: NOT MET  PLAN:  PT FREQUENCY: 2x/week  PT DURATION: other: 5 weeks.  PLANNED INTERVENTIONS: Therapeutic exercises, Therapeutic activity, Patient/Family education, Self Care, Dry Needling,  Electrical stimulation, Cryotherapy, Moist heat, Ultrasound, and Manual therapy.  PLAN FOR NEXT SESSION: Ask for more DN  Lenay Lovejoy,CHRIS, PTA 09/05/2022, 11:59 AM

## 2022-09-09 ENCOUNTER — Ambulatory Visit: Payer: Medicare HMO | Attending: Orthopedic Surgery | Admitting: Physical Therapy

## 2022-09-09 ENCOUNTER — Encounter: Payer: Self-pay | Admitting: Physical Therapy

## 2022-09-09 DIAGNOSIS — M5459 Other low back pain: Secondary | ICD-10-CM | POA: Diagnosis not present

## 2022-09-09 DIAGNOSIS — M62830 Muscle spasm of back: Secondary | ICD-10-CM | POA: Insufficient documentation

## 2022-09-09 NOTE — Therapy (Signed)
OUTPATIENT PHYSICAL THERAPY THORACOLUMBAR TREATMENT   Patient Name: Ashley [name] MRN: [id number] DOB:[birth year], 72 y.o., female Today's Date: 09/09/2022  END OF SESSION:  PT End of Session - 09/09/22 0844     Visit Number 12    Number of Visits 14    Date for PT Re-Evaluation 10/07/22    Authorization Type FOTO.    PT Start Time [phone]    PT Stop Time [phone]    PT Time Calculation (min) 56 min    Activity Tolerance Patient tolerated treatment well    Behavior During Therapy [facility] for tasks assessed/performed             Past Medical History:  Diagnosis Date   Depression    Diabetes mellitus without complication (HCC)    Heart disease    Hyperlipidemia    Hypertension    Hypertension    Past Surgical History:  Procedure Laterality Date   CYST EXCISION Left 03/15/2017   from buttocks   ORIF ANKLE FRACTURE Left 04/20/2017   Procedure: OPEN REDUCTION INTERNAL FIXATION (ORIF) LEFT ANKLE FRACTURE;  Surgeon: Beverely Low, MD;  Location: [facility] OR;  Service: Orthopedics;  Laterality: Left;   Patient Active Problem List   Diagnosis Date Noted   Aortic valve stenosis, mild 03/22/2022   Persistent atrial fibrillation (HCC)    Severe obesity (BMI >= 40) (HCC) 07/30/2014   Hyperlipidemia 05/31/2010   HTN (hypertension) 05/31/2010   Diabetes (HCC) 05/31/2010   Peripheral edema 05/31/2010   REFERRING PROVIDER: Fuller Canada MD  REFERRING DIAG: Lumbar pain.  Rationale for Evaluation and Treatment: Rehabilitation  THERAPY DIAG:  Other low back pain  Muscle spasm of back  ONSET DATE: April 2024.  SUBJECTIVE:                                                                                                                                                                                           SUBJECTIVE STATEMENT: Reports the last two days has been great in regards to pain and independence as she has been able to do some yardwork and not use her cane. Just has some pain across  the top of her iliac crests region.  PERTINENT HISTORY:  Left ankle ORIF.  PAIN:  Are you having pain? Yes: NPRS scale: 1-2/10 Pain location: Lumbar/hip Pain description: Ache. Aggravating factors: As above. Relieving factors: As above.  PRECAUTIONS: None  PATIENT GOALS: Get out in yard.  OBJECTIVE:   DIAGNOSTIC FINDINGS:  IMPRESSION: 1. No acute fracture or dislocation. 2. Moderate left hip osteoarthritis.  Most notable is the increased lordosis in the lumbar spine with the facet arthritis starting second  lumbar vertebrae and transitioning down through S1 with L4-5 grade 1 spondylolisthesis.  The disc spaces are preserved   Impression spondylosis lumbar spine with spondylolisthesis  PATIENT SURVEYS:  FOTO 38  POSTURE: rounded shoulders, forward head, and increased lumbar lordosis  PALPATION: Mild tenderness today over left SIJ region and upper gluteal musculature.  LUMBAR ROM:  Full active lumbar flexion and extension to 15 degrees.  LOWER EXTREMITY ROM: WFL's.  LOWER EXTREMITY MMT:   Normal LE strength.  LUMBAR SPECIAL TESTS:  Equal leg lengths. (-) SLR and FABER testing.   Normal Patellar reflexes and unable to elicit Achilles reflexes.    GAIT: WNL.  TODAY'S TREATMENT:                                                                                                                              DATE: 09/09/22  EXERCISE LOG       LT hip  Exercise Repetitions and Resistance Comments  Nustep L4 x74min                    Blank cell = exercise not performed today   Modalities  Date: 09/09/22            LT HIP Unattended Estim: Hip, IFC, 10 mins, Pain Combo: Hip, 1.5 w/cm2, 100%, 1 mhz, 10 mins, Pain Hot Pack: Hip, 10 mins, Pain  Manual Therapy Soft Tissue Mobilization: L TFL, glute med, lumbar, reduce pain and tone   DN toLT hip (TFL)from  MPT.  [place] Applegate MPT   PATIENT EDUCATION:  Education details: Discussed lumbar exercise progression. Person  educated: Patient Education method: Explanation Education comprehension: verbalized understanding  HOME EXERCISE PROGRAM:  ASSESSMENT:  CLINICAL IMPRESSION: Patient presented in clinic with reports of less hip pain and more independence yesterday. Patient did report a sharp TP in L superior TFL region and soreness in lumbar region. DN continued by [place] Applegate, MPT with good response. Normal modalities response noted following removal of the modalities.  OBJECTIVE IMPAIRMENTS: decreased activity tolerance, decreased ROM, increased muscle spasms, postural dysfunction, and pain.   ACTIVITY LIMITATIONS: lifting  PARTICIPATION LIMITATIONS: meal prep, cleaning, laundry, and yard work  [facility] POTENTIAL: Excellent  CLINICAL DECISION MAKING: Stable/uncomplicated  EVALUATION COMPLEXITY: Low  GOALS:  SHORT TERM GOALS: Target date: 08/10/22  Ind with an HEP. Goal status: REVISED  LONG TERM GOALS: Target date: 08/31/22  Perform ADL's with pain not > 2-3/10.  Goal status: MET  2.  Walk a community distance with pain not > 3/10.  Goal status: NOT MET  3.  Perform yardwork with pain not > 3/10.  Goal status: NOT MET  PLAN:  PT FREQUENCY: 2x/week  PT DURATION: other: 5 weeks.  PLANNED INTERVENTIONS: Therapeutic exercises, Therapeutic activity, Patient/Family education, Self Care, Dry Needling, Electrical stimulation, Cryotherapy, Moist heat, Ultrasound, and Manual therapy.  PLAN FOR NEXT SESSION: Ask for more DN  Marvell Fuller, PTA 09/09/2022, 10:11 AM

## 2022-09-12 ENCOUNTER — Encounter: Payer: Self-pay | Admitting: Physical Therapy

## 2022-09-12 ENCOUNTER — Ambulatory Visit: Payer: Medicare HMO | Admitting: Physical Therapy

## 2022-09-12 DIAGNOSIS — M62830 Muscle spasm of back: Secondary | ICD-10-CM | POA: Diagnosis not present

## 2022-09-12 DIAGNOSIS — M5459 Other low back pain: Secondary | ICD-10-CM | POA: Diagnosis not present

## 2022-09-12 NOTE — Therapy (Signed)
OUTPATIENT PHYSICAL THERAPY THORACOLUMBAR TREATMENT   Patient Name: Ashley [name] MRN: [id number] DOB:Dec 12, 1950, 72 y.o., female Today's Date: 09/12/2022  END OF SESSION:  PT End of Session - 09/12/22 1013     Visit Number 13    Number of Visits 14    Date for PT Re-Evaluation 10/07/22    Authorization Type FOTO.    PT Start Time 0845    PT Stop Time 0940    PT Time Calculation (min) 55 min    Activity Tolerance Patient tolerated treatment well    Behavior During Therapy [facility] for tasks assessed/performed             Past Medical History:  Diagnosis Date   Depression    Diabetes mellitus without complication (HCC)    Heart disease    Hyperlipidemia    Hypertension    Hypertension    Past Surgical History:  Procedure Laterality Date   CYST EXCISION Left 03/15/2017   from buttocks   ORIF ANKLE FRACTURE Left 04/20/2017   Procedure: OPEN REDUCTION INTERNAL FIXATION (ORIF) LEFT ANKLE FRACTURE;  Surgeon: Beverely Low, MD;  Location: [facility] OR;  Service: Orthopedics;  Laterality: Left;   Patient Active Problem List   Diagnosis Date Noted   Aortic valve stenosis, mild 03/22/2022   Persistent atrial fibrillation (HCC)    Severe obesity (BMI >= 40) (HCC) 07/30/2014   Hyperlipidemia 05/31/2010   HTN (hypertension) 05/31/2010   Diabetes (HCC) 05/31/2010   Peripheral edema 05/31/2010   REFERRING PROVIDER: Fuller Canada MD  REFERRING DIAG: Lumbar pain.  Rationale for Evaluation and Treatment: Rehabilitation  THERAPY DIAG:  Other low back pain  Muscle spasm of back  ONSET DATE: April 2024.  SUBJECTIVE:                                                                                                                                                                                           SUBJECTIVE STATEMENT: Pain down to a 1.  PERTINENT HISTORY:  Left ankle ORIF.  PAIN:  Are you having pain? Yes: NPRS scale: 1/10 Pain location: Lumbar/hip Pain description:  Ache. Aggravating factors: As above. Relieving factors: As above.  PRECAUTIONS: None  PATIENT GOALS: Get out in yard.  OBJECTIVE:   DIAGNOSTIC FINDINGS:  IMPRESSION: 1. No acute fracture or dislocation. 2. Moderate left hip osteoarthritis.  Most notable is the increased lordosis in the lumbar spine with the facet arthritis starting second lumbar vertebrae and transitioning down through S1 with L4-5 grade 1 spondylolisthesis.  The disc spaces are preserved   Impression spondylosis lumbar spine with spondylolisthesis  PATIENT SURVEYS:  FOTO 38  POSTURE: rounded  shoulders, forward head, and increased lumbar lordosis  PALPATION: Mild tenderness today over left SIJ region and upper gluteal musculature.  LUMBAR ROM:  Full active lumbar flexion and extension to 15 degrees.  LOWER EXTREMITY ROM: WFL's.  LOWER EXTREMITY MMT:   Normal LE strength.  LUMBAR SPECIAL TESTS:  Equal leg lengths. (-) SLR and FABER testing.   Normal Patellar reflexes and unable to elicit Achilles reflexes.    GAIT: WNL.  TODAY'S TREATMENT:                                                                                                                              DATE: 09/12/22  EXERCISE LOG       LT hip  Exercise Repetitions and Resistance Comments  Nustep L4 x 19 min                   DN to right TFL f/b STW/M x 5 minutes f/b HMP and IFC at 80-150 Hz on 40% scan x 20 minutes.   Normal modality response following removal of modality.  PATIENT EDUCATION:  Education details: Discussed lumbar exercise progression. Person educated: Patient Education method: Explanation Education comprehension: verbalized understanding  HOME EXERCISE PROGRAM:  ASSESSMENT:  CLINICAL IMPRESSION: Excellent response to treatments including dry needling.  Pain very low upon presentation to the clinic today with a very good response today.  OBJECTIVE IMPAIRMENTS: decreased activity tolerance, decreased ROM,  increased muscle spasms, postural dysfunction, and pain.   ACTIVITY LIMITATIONS: lifting  PARTICIPATION LIMITATIONS: meal prep, cleaning, laundry, and yard work  [facility] POTENTIAL: Excellent  CLINICAL DECISION MAKING: Stable/uncomplicated  EVALUATION COMPLEXITY: Low  GOALS:  SHORT TERM GOALS: Target date: 08/10/22  Ind with an HEP. Goal status: REVISED  LONG TERM GOALS: Target date: 08/31/22  Perform ADL's with pain not > 2-3/10.  Goal status: MET  2.  Walk a community distance with pain not > 3/10.  Goal status: NOT MET  3.  Perform yardwork with pain not > 3/10.  Goal status: NOT MET  PLAN:  PT FREQUENCY: 2x/week  PT DURATION: other: 5 weeks.  PLANNED INTERVENTIONS: Therapeutic exercises, Therapeutic activity, Patient/Family education, Self Care, Dry Needling, Electrical stimulation, Cryotherapy, Moist heat, Ultrasound, and Manual therapy.  PLAN FOR NEXT SESSION: Ask for more DN  , [place], PT 09/12/2022, 11:04 AM

## 2022-09-13 DIAGNOSIS — D485 Neoplasm of uncertain behavior of skin: Secondary | ICD-10-CM | POA: Diagnosis not present

## 2022-09-13 DIAGNOSIS — Z1283 Encounter for screening for malignant neoplasm of skin: Secondary | ICD-10-CM | POA: Diagnosis not present

## 2022-09-13 DIAGNOSIS — L570 Actinic keratosis: Secondary | ICD-10-CM | POA: Diagnosis not present

## 2022-09-16 ENCOUNTER — Ambulatory Visit: Payer: Medicare HMO | Admitting: Physical Therapy

## 2022-09-16 DIAGNOSIS — M5459 Other low back pain: Secondary | ICD-10-CM

## 2022-09-16 DIAGNOSIS — M62830 Muscle spasm of back: Secondary | ICD-10-CM | POA: Diagnosis not present

## 2022-09-16 NOTE — Therapy (Addendum)
OUTPATIENT PHYSICAL THERAPY THORACOLUMBAR TREATMENT   Patient Name: Ashley [name] MRN: [id number] DOB:1950/09/30, 72 y.o., female Today's Date: 09/16/2022  END OF SESSION:  PT End of Session - 09/16/22 0850     Visit Number 14    Number of Visits 14    Date for PT Re-Evaluation 10/07/22    Authorization Type FOTO.    PT Start Time 0845    PT Stop Time [phone]    PT Time Calculation (min) 57 min    Activity Tolerance Patient tolerated treatment well    Behavior During Therapy [facility] for tasks assessed/performed             Past Medical History:  Diagnosis Date   Depression    Diabetes mellitus without complication (HCC)    Heart disease    Hyperlipidemia    Hypertension    Hypertension    Past Surgical History:  Procedure Laterality Date   CYST EXCISION Left 03/15/2017   from buttocks   ORIF ANKLE FRACTURE Left 04/20/2017   Procedure: OPEN REDUCTION INTERNAL FIXATION (ORIF) LEFT ANKLE FRACTURE;  Surgeon: Beverely Low, MD;  Location: [facility] OR;  Service: Orthopedics;  Laterality: Left;   Patient Active Problem List   Diagnosis Date Noted   Aortic valve stenosis, mild 03/22/2022   Persistent atrial fibrillation (HCC)    Severe obesity (BMI >= 40) (HCC) 07/30/2014   Hyperlipidemia 05/31/2010   HTN (hypertension) 05/31/2010   Diabetes (HCC) 05/31/2010   Peripheral edema 05/31/2010   REFERRING PROVIDER: Fuller Canada MD  REFERRING DIAG: Lumbar pain.  Rationale for Evaluation and Treatment: Rehabilitation  THERAPY DIAG:  Other low back pain  Muscle spasm of back  ONSET DATE: April 2024.  SUBJECTIVE:                                                                                                                                                                                           SUBJECTIVE STATEMENT: Increase in low back pain which she feels is due to the rainy weather.   PERTINENT HISTORY:  Left ankle ORIF.  PAIN:  Are you having pain? Yes: NPRS scale:   /10 Pain location: Lumbar/hip Pain description: Ache. Aggravating factors: As above. Relieving factors: As above.  PRECAUTIONS: None  PATIENT GOALS: Get out in yard.  OBJECTIVE:   DIAGNOSTIC FINDINGS:  IMPRESSION: 1. No acute fracture or dislocation. 2. Moderate left hip osteoarthritis.  Most notable is the increased lordosis in the lumbar spine with the facet arthritis starting second lumbar vertebrae and transitioning down through S1 with L4-5 grade 1 spondylolisthesis.  The disc spaces are preserved   Impression spondylosis lumbar spine  with spondylolisthesis  PATIENT SURVEYS:  FOTO 38  POSTURE: rounded shoulders, forward head, and increased lumbar lordosis  PALPATION: Mild tenderness today over left SIJ region and upper gluteal musculature.  LUMBAR ROM:  Full active lumbar flexion and extension to 15 degrees.  LOWER EXTREMITY ROM: WFL's.  LOWER EXTREMITY MMT:   Normal LE strength.  LUMBAR SPECIAL TESTS:  Equal leg lengths. (-) SLR and FABER testing.   Normal Patellar reflexes and unable to elicit Achilles reflexes.    GAIT: WNL.  TODAY'S TREATMENT:                                                                                                                              DATE: 09/16/22  EXERCISE LOG       LT hip  Exercise Repetitions and Resistance Comments  Nustep L4 x 17 min                   DN to right TFL f/b STW/M x 7 minutes f/b HMP and IFC at 80-150 Hz on 40% scan x 20 minutes.   Normal modality response following removal of modality.  PATIENT EDUCATION:  Education details: Discussed lumbar exercise progression. Person educated: Patient Education method: Explanation Education comprehension: verbalized understanding  HOME EXERCISE PROGRAM:  ASSESSMENT:  CLINICAL IMPRESSION: Excellent response to treatments including dry needling.  Left hip has done well. She still has pain with longer walks and yardwork activities but much better since  beginning PT.  OBJECTIVE IMPAIRMENTS: decreased activity tolerance, decreased ROM, increased muscle spasms, postural dysfunction, and pain.   ACTIVITY LIMITATIONS: lifting  PARTICIPATION LIMITATIONS: meal prep, cleaning, laundry, and yard work  [facility] POTENTIAL: Excellent  CLINICAL DECISION MAKING: Stable/uncomplicated  EVALUATION COMPLEXITY: Low  GOALS:  SHORT TERM GOALS: Target date: 08/10/22  Ind with an HEP. Goal status: REVISED  LONG TERM GOALS: Target date: 08/31/22  Perform ADL's with pain not > 2-3/10.  Goal status: MET  2.  Walk a community distance with pain not > 3/10.  Goal status: Partially met.  3.  Perform yardwork with pain not > 3/10.  Goal status: Partially met.  PLAN:  PT FREQUENCY: 2x/week  PT DURATION: other: 5 weeks.  PLANNED INTERVENTIONS: Therapeutic exercises, Therapeutic activity, Patient/Family education, Self Care, Dry Needling, Electrical stimulation, Cryotherapy, Moist heat, Ultrasound, and Manual therapy.  PLAN FOR NEXT SESSION: Ask for more DN  Temisha Murley, [place], PT 09/16/2022, 10:58 AM   PHYSICAL THERAPY DISCHARGE SUMMARY  Visits from Start of Care: 14.  Current functional level related to goals / functional outcomes: See goal section.   Remaining deficits: Very good response to dry needling and reduction of left hip pain.  Pain will increase with longer walks and doing yardwork.   Education / Equipment: HEP.   Patient agrees to discharge. Patient goals were partially met. Patient is being discharged due to being pleased with the current functional level.    [place] Raffael Bugarin MPT

## 2022-09-19 ENCOUNTER — Ambulatory Visit: Payer: Medicare HMO | Admitting: Nurse Practitioner

## 2022-09-21 ENCOUNTER — Other Ambulatory Visit: Payer: Self-pay | Admitting: Nurse Practitioner

## 2022-09-21 DIAGNOSIS — M25552 Pain in left hip: Secondary | ICD-10-CM

## 2022-09-26 ENCOUNTER — Encounter: Payer: Self-pay | Admitting: Nurse Practitioner

## 2022-09-26 ENCOUNTER — Ambulatory Visit (INDEPENDENT_AMBULATORY_CARE_PROVIDER_SITE_OTHER): Payer: Medicare HMO | Admitting: Nurse Practitioner

## 2022-09-26 VITALS — BP 142/81 | HR 56 | Temp 98.3°F | Resp 20 | Ht 62.0 in | Wt 264.0 lb

## 2022-09-26 DIAGNOSIS — I4819 Other persistent atrial fibrillation: Secondary | ICD-10-CM

## 2022-09-26 DIAGNOSIS — E782 Mixed hyperlipidemia: Secondary | ICD-10-CM

## 2022-09-26 DIAGNOSIS — R600 Localized edema: Secondary | ICD-10-CM | POA: Diagnosis not present

## 2022-09-26 DIAGNOSIS — F411 Generalized anxiety disorder: Secondary | ICD-10-CM

## 2022-09-26 DIAGNOSIS — I10 Essential (primary) hypertension: Secondary | ICD-10-CM | POA: Diagnosis not present

## 2022-09-26 DIAGNOSIS — E119 Type 2 diabetes mellitus without complications: Secondary | ICD-10-CM

## 2022-09-26 DIAGNOSIS — Z7984 Long term (current) use of oral hypoglycemic drugs: Secondary | ICD-10-CM

## 2022-09-26 LAB — BAYER DCA HB A1C WAIVED: HB A1C (BAYER DCA - WAIVED): 7 % — ABNORMAL HIGH (ref 4.8–5.6)

## 2022-09-26 NOTE — Progress Notes (Signed)
Subjective:    Patient ID: Ashley [name], female    DOB: [birth year], 72 y.o.   MRN: [id number]   Chief Complaint: medical management of chronic issues     HPI:  Ashley [name] is a 72 y.o. who identifies as a female who was assigned female at birth.   Social history: Lives with: her granddaughter lives with her Work history: drives a school bus   Comes in today for follow up of the following chronic medical issues:  1. Primary hypertension No c/o chest pain, sob or headache. Does not check blood pressure at home. BP Readings from Last 3 Encounters:  07/18/22 136/73  06/17/22 131/67  05/12/22 (!) 148/64     2. Mixed hyperlipidemia Does not really watch diet and does no dedicated exercise. Lab Results  Component Value Date   CHOL 127 06/17/2022   HDL 49 06/17/2022   LDLCALC 54 06/17/2022   TRIG 138 06/17/2022   CHOLHDL 2.6 06/17/2022     3. Diabetes mellitus treated with oral medication (HCC) Doe snot check blood sugras at home. Nor does she watch her diet. Lab Results  Component Value Date   HGBA1C 7.7 (H) 06/17/2022     4. Persistent atrial fibrillation (HCC) Is on eliquis daily- no bleeding issues  5. Peripheral edema Has edema at the end of each day- will resolve at night  6. Severe obesity (BMI >= 40) (HCC) No recent weight changes Wt Readings from Last 3 Encounters:  09/26/22 264 lb (119.7 kg)  07/25/22 256 lb (116.1 kg)  07/18/22 257 lb (116.6 kg)   BMI Readings from Last 3 Encounters:  09/26/22 48.29 kg/m  07/25/22 46.82 kg/m  07/18/22 47.01 kg/m      New complaints: None today  Allergies  Allergen Reactions   Ciprofloxacin Hcl Hives   Outpatient Encounter Medications as of 09/26/2022  Medication Sig   Accu-Chek Softclix Lancets lancets TEST BLOOD SUGAR TWICE DAILY   acetaminophen (TYLENOL) 500 MG tablet Take 1,000 mg by mouth every 6 (six) hours as needed (for pain/headaches.).   albuterol (VENTOLIN HFA) 108 (90 Base) MCG/ACT  inhaler INHALE 2 PUFFS INTO THE LUNGS EVERY 6 HOURS AS NEEDED FOR WHEEZE OR SHORTNESS OF BREATH   Alcohol Swabs (DROPSAFE ALCOHOL PREP) 70 % PADS CHECK BLOOD SUGAR TWICE DAILY   ALPRAZolam (XANAX) 0.25 MG tablet Take 1 tablet (0.25 mg total) by mouth daily as needed for anxiety.   amLODipine (NORVASC) 5 MG tablet Take 1 tablet (5 mg total) by mouth daily.   apixaban (ELIQUIS) 5 MG TABS tablet Take 1 tablet (5 mg total) by mouth 2 (two) times daily.   Ascorbic Acid (VITAMIN C) 1000 MG tablet Take 1,000 mg by mouth daily.   Blood Glucose Monitoring Suppl (ACCU-CHEK GUIDE ME) w/Device KIT TEST BLOOD SUGAR TWICE DAILY Dx E11.9   celecoxib (CELEBREX) 200 MG capsule TAKE 1 CAPSULE TWICE DAILY   Cholecalciferol (VITAMIN D3) 1000 units CAPS Take 1 capsule by mouth daily.   CINNAMON PO Take 1 capsule by mouth daily.    cloNIDine (CATAPRES) 0.2 MG tablet Take 1 tablet (0.2 mg total) by mouth 2 (two) times daily.   furosemide (LASIX) 20 MG tablet Take 1 tablet (20 mg total) by mouth daily.   Ginger, Zingiber officinalis, (GINGER ROOT PO) Take 1 capsule by mouth daily.    glipiZIDE (GLUCOTROL) 5 MG tablet Take 1 tablet (5 mg total) by mouth 2 (two) times daily.   glucose blood (ACCU-CHEK AVIVA PLUS) test strip TEST  BLOOD SUGAR TWICE DAILY   lisinopril (ZESTRIL) 40 MG tablet Take 1 tablet (40 mg total) by mouth daily.   metFORMIN (GLUCOPHAGE) 1000 MG tablet Take 1 tablet (1,000 mg total) by mouth 2 (two) times daily with a meal.   metoprolol tartrate (LOPRESSOR) 50 MG tablet Take 1 tablet (50 mg total) by mouth 2 (two) times daily.   Multiple Vitamins-Minerals (ZINC PO) Take 1 tablet by mouth daily.   Omega-3 Fatty Acids (FISH OIL) 1000 MG CAPS Take 1 capsule by mouth daily.   simvastatin (ZOCOR) 40 MG tablet Take 1 tablet (40 mg total) by mouth every evening.   Turmeric Curcumin 500 MG CAPS Take 500 mg by mouth daily.    zolpidem (AMBIEN) 5 MG tablet TAKE 1 TABLET AT BEDTIME FOR SLEEP   No  facility-administered encounter medications on file as of 09/26/2022.    Past Surgical History:  Procedure Laterality Date   CYST EXCISION Left 03/15/2017   from buttocks   ORIF ANKLE FRACTURE Left 04/20/2017   Procedure: OPEN REDUCTION INTERNAL FIXATION (ORIF) LEFT ANKLE FRACTURE;  Surgeon: Beverely Low, MD;  Location: [facility] OR;  Service: Orthopedics;  Laterality: Left;    Family History  Problem Relation Age of Onset   Diabetes Mother    CAD Mother        CABG at age 2   Hypertension Mother    Hyperlipidemia Mother    Diabetes Father    Heart disease Father        CHF   Hypertension Father    Diabetes Sister    CAD Sister    Hypertension Sister    Diabetes Brother    Hypertension Brother    Healthy Sister       Controlled substance contract: n/a     Review of Systems  Constitutional:  Negative for diaphoresis.  Eyes:  Negative for pain.  Respiratory:  Negative for shortness of breath.   Cardiovascular:  Negative for chest pain, palpitations and leg swelling.  Gastrointestinal:  Negative for abdominal pain.  Endocrine: Negative for polydipsia.  Skin:  Negative for rash.  Neurological:  Negative for dizziness, weakness and headaches.  Hematological:  Does not bruise/bleed easily.  All other systems reviewed and are negative.      Objective:   Physical Exam Vitals and nursing note reviewed.  Constitutional:      General: She is not in acute distress.    Appearance: Normal appearance. She is well-developed.  HENT:     Head: Normocephalic.     Right Ear: Tympanic membrane normal.     Left Ear: Tympanic membrane normal.     Nose: Nose normal.     Mouth/Throat:     Mouth: Mucous membranes are moist.  Eyes:     Pupils: Pupils are equal, round, and reactive to light.  Neck:     Vascular: No carotid bruit or JVD.  Cardiovascular:     Rate and Rhythm: Normal rate and regular rhythm.     Heart sounds: Normal heart sounds.  Pulmonary:     Effort: Pulmonary  effort is normal. No respiratory distress.     Breath sounds: Normal breath sounds. No wheezing or rales.  Chest:     Chest wall: No tenderness.  Abdominal:     General: Bowel sounds are normal. There is no distension or abdominal bruit.     Palpations: Abdomen is soft. There is no hepatomegaly, splenomegaly, mass or pulsatile mass.     Tenderness: There is no  abdominal tenderness.  Musculoskeletal:        General: Normal range of motion.     Cervical back: Normal range of motion and neck supple.  Lymphadenopathy:     Cervical: No cervical adenopathy.  Skin:    General: Skin is warm and dry.  Neurological:     Mental Status: She is alert and oriented to person, place, and time.     Deep Tendon Reflexes: Reflexes are normal and symmetric.  Psychiatric:        Behavior: Behavior normal.        Thought Content: Thought content normal.        Judgment: Judgment normal.    BP (!) 142/81   Pulse (!) 56   Temp 98.3 F (36.8 C) (Temporal)   Resp 20   Ht 5\' 2"  (1.575 m)   Wt 264 lb (119.7 kg)   SpO2 98%   BMI 48.29 kg/m    HGBa1c 7.0%     Assessment & Plan:   Ashley [name] comes in today with chief complaint of Medical Management of Chronic Issues   Diagnosis and orders addressed:  1. Primary hypertension Low sodium diet - CBC with Differential/Platelet - CMP14+EGFR  2. Mixed hyperlipidemia Low fat diet - Lipid panel  3. Diabetes mellitus treated with oral medication (HCC) Continue to watch cabs in diet - Bayer DCA Hb A1c Waived - Microalbumin / creatinine urine ratio  4. Persistent atrial fibrillation (HCC) Avoid caffeine  5. Peripheral edema Elevate legs when sitting  6. Severe obesity (BMI >= 40) (HCC) Discussed diet and exercise for person with BMI >25 Will recheck weight in 3-6 months   7. GAD (generalized anxiety disorder) Stress management - ToxASSURE Select 13 (MW), Urine   Labs pending Health Maintenance reviewed Diet and exercise  encouraged  Follow up plan: 6 months   Mary-Margaret [name], FNP

## 2022-09-26 NOTE — Patient Instructions (Signed)
Peripheral Edema  Peripheral edema is swelling that is caused by a buildup of fluid. Peripheral edema most often affects the lower legs, ankles, and feet. It can also develop in the arms, hands, and face. The area of the body that has peripheral edema will look swollen. It may also feel heavy or warm. Your clothes may start to feel tight. Pressing on the area may make a temporary dent in your skin (pitting edema). You may not be able to move your swollen arm or leg as much as usual. There are many causes of peripheral edema. It can happen because of a complication of other conditions such as heart failure, kidney disease, or a problem with your circulation. It also can be a side effect of certain medicines or happen because of an infection. It often happens to women during pregnancy. Sometimes, the cause is not known. Follow these instructions at home: Managing pain, stiffness, and swelling  Raise (elevate) your legs while you are sitting or lying down. Move around often to prevent stiffness and to reduce swelling. Do not sit or stand for long periods of time. Do not wear tight clothing. Do not wear garters on your upper legs. Exercise your legs to get your circulation going. This helps to move the fluid back into your blood vessels, and it may help the swelling go down. Wear compression stockings as told by your health care provider. These stockings help to prevent blood clots and reduce swelling in your legs. It is important that these are the correct size. These stockings should be prescribed by your doctor to prevent possible injuries. If elastic bandages or wraps are recommended, use them as told by your health care provider. Medicines Take over-the-counter and prescription medicines only as told by your health care provider. Your health care provider may prescribe medicine to help your body get rid of excess water (diuretic). Take this medicine if you are told to take it. General  instructions Eat a low-salt (low-sodium) diet as told by your health care provider. Sometimes, eating less salt may reduce swelling. Pay attention to any changes in your symptoms. Moisturize your skin daily to help prevent skin from cracking and draining. Keep all follow-up visits. This is important. Contact a health care provider if: You have a fever. You have swelling in only one leg. You have increased swelling, redness, or pain in one or both of your legs. You have drainage or sores at the area where you have edema. Get help right away if: You have edema that starts suddenly or is getting worse, especially if you are pregnant or have a medical condition. You develop shortness of breath, especially when you are lying down. You have pain in your chest or abdomen. You feel weak. You feel like you will faint. These symptoms may be an emergency. Get help right away. Call 911. Do not wait to see if the symptoms will go away. Do not drive yourself to the hospital. Summary Peripheral edema is swelling that is caused by a buildup of fluid. Peripheral edema most often affects the lower legs, ankles, and feet. Move around often to prevent stiffness and to reduce swelling. Do not sit or stand for long periods of time. Pay attention to any changes in your symptoms. Contact a health care provider if you have edema that starts suddenly or is getting worse, especially if you are pregnant or have a medical condition. Get help right away if you develop shortness of breath, especially when lying down.   This information is not intended to replace advice given to you by your health care provider. Make sure you discuss any questions you have with your health care provider. Document Revised: 09/28/2020 Document Reviewed: 09/28/2020 Elsevier Patient Education  2024 Elsevier Inc.  

## 2022-09-27 LAB — LIPID PANEL
Chol/HDL Ratio: 2.7 ratio (ref 0.0–4.4)
Cholesterol, Total: 120 mg/dL (ref 100–199)
HDL: 44 mg/dL (ref >39)
LDL Chol Calc (NIH): 48 mg/dL (ref 0–99)
Triglycerides: 166 mg/dL — ABNORMAL HIGH (ref 0–149)
VLDL Cholesterol Cal: 28 mg/dL (ref 5–40)

## 2022-09-27 LAB — CBC WITH DIFFERENTIAL/PLATELET
Basophils Absolute: 0.1 10*3/uL (ref 0.0–0.2)
Basos: 1 %
EOS (ABSOLUTE): 0.1 10*3/uL (ref 0.0–0.4)
Eos: 2 %
Hematocrit: 40.2 % (ref 34.0–46.6)
Hemoglobin: 13.4 g/dL (ref 11.1–15.9)
Immature Grans (Abs): 0 10*3/uL (ref 0.0–0.1)
Immature Granulocytes: 1 %
Lymphocytes Absolute: 1.8 10*3/uL (ref 0.7–3.1)
Lymphs: 25 %
MCH: 32.4 pg (ref 26.6–33.0)
MCHC: 33.3 g/dL (ref 31.5–35.7)
MCV: 97 fL (ref 79–97)
Monocytes Absolute: 0.5 10*3/uL (ref 0.1–0.9)
Monocytes: 7 %
Neutrophils Absolute: 4.7 10*3/uL (ref 1.4–7.0)
Neutrophils: 64 %
Platelets: 214 10*3/uL (ref 150–450)
RBC: 4.13 x10E6/uL (ref 3.77–5.28)
RDW: 12.6 % (ref 11.7–15.4)
WBC: 7.2 10*3/uL (ref 3.4–10.8)

## 2022-09-27 LAB — CMP14+EGFR
ALT: 24 IU/L (ref 0–32)
AST: 36 IU/L (ref 0–40)
Albumin: 4.2 g/dL (ref 3.9–4.9)
Alkaline Phosphatase: 55 IU/L (ref 44–121)
BUN/Creatinine Ratio: 39 — ABNORMAL HIGH (ref 12–28)
BUN: 24 mg/dL (ref 8–27)
Bilirubin Total: 0.8 mg/dL (ref 0.0–1.2)
CO2: 22 mmol/L (ref 20–29)
Calcium: 9.8 mg/dL (ref 8.7–10.3)
Chloride: 103 mmol/L (ref 96–106)
Creatinine, Ser: 0.62 mg/dL (ref 0.57–1.00)
Globulin, Total: 3 g/dL (ref 1.5–4.5)
Glucose: 192 mg/dL — ABNORMAL HIGH (ref 70–99)
Potassium: 4.9 mmol/L (ref 3.5–5.2)
Sodium: 141 mmol/L (ref 134–144)
Total Protein: 7.2 g/dL (ref 6.0–8.5)
eGFR: 96 mL/min/{1.73_m2} (ref >59)

## 2022-09-27 LAB — MICROALBUMIN / CREATININE URINE RATIO
Creatinine, Urine: 123.7 mg/dL
Microalb/Creat Ratio: 15 mg/g{creat} (ref 0–29)
Microalbumin, Urine: 18.5 ug/mL

## 2022-09-29 LAB — TOXASSURE SELECT 13 (MW), URINE

## 2022-10-28 ENCOUNTER — Other Ambulatory Visit: Payer: Self-pay | Admitting: Nurse Practitioner

## 2022-11-11 ENCOUNTER — Other Ambulatory Visit: Payer: Self-pay | Admitting: Nurse Practitioner

## 2022-11-11 DIAGNOSIS — Z1211 Encounter for screening for malignant neoplasm of colon: Secondary | ICD-10-CM

## 2022-11-11 DIAGNOSIS — Z1212 Encounter for screening for malignant neoplasm of rectum: Secondary | ICD-10-CM

## 2022-12-13 ENCOUNTER — Telehealth: Payer: Self-pay | Admitting: Nurse Practitioner

## 2022-12-15 ENCOUNTER — Other Ambulatory Visit: Payer: Self-pay | Admitting: Nurse Practitioner

## 2022-12-15 DIAGNOSIS — M25552 Pain in left hip: Secondary | ICD-10-CM

## 2022-12-16 ENCOUNTER — Ambulatory Visit: Payer: Medicare HMO | Admitting: Nurse Practitioner

## 2022-12-16 ENCOUNTER — Encounter: Payer: Self-pay | Admitting: Nurse Practitioner

## 2022-12-16 VITALS — BP 161/86 | HR 70 | Temp 98.2°F | Resp 20 | Ht 62.0 in | Wt 267.0 lb

## 2022-12-16 DIAGNOSIS — M545 Low back pain, unspecified: Secondary | ICD-10-CM

## 2022-12-16 MED ORDER — KETOROLAC TROMETHAMINE 60 MG/2ML IM SOLN
60.0000 mg | Freq: Once | INTRAMUSCULAR | Status: AC
  Administered 2022-12-16: 60 mg via INTRAMUSCULAR

## 2022-12-16 MED ORDER — METHYLPREDNISOLONE ACETATE 80 MG/ML IJ SUSP
80.0000 mg | Freq: Once | INTRAMUSCULAR | Status: AC
  Administered 2022-12-16: 80 mg via INTRAMUSCULAR

## 2022-12-16 MED ORDER — PREDNISONE 20 MG PO TABS
ORAL_TABLET | ORAL | 0 refills | Status: DC
Start: 2022-12-16 — End: 2023-02-02

## 2022-12-16 NOTE — Patient Instructions (Signed)
Acute Back Pain, Adult Acute back pain is sudden and usually short-lived. It is often caused by an injury to the muscles and tissues in the back. The injury may result from: A muscle, tendon, or ligament getting overstretched or torn. Ligaments are tissues that connect bones to each other. Lifting something improperly can cause a back strain. Wear and tear (degeneration) of the spinal disks. Spinal disks are circular tissue that provide cushioning between the bones of the spine (vertebrae). Twisting motions, such as while playing sports or doing yard work. A hit to the back. Arthritis. You may have a physical exam, lab tests, and imaging tests to find the cause of your pain. Acute back pain usually goes away with rest and home care. Follow these instructions at home: Managing pain, stiffness, and swelling Take over-the-counter and prescription medicines only as told by your health care provider. Treatment may include medicines for pain and inflammation that are taken by mouth or applied to the skin, or muscle relaxants. Your health care provider may recommend applying ice during the first 24-48 hours after your pain starts. To do this: Put ice in a plastic bag. Place a towel between your skin and the bag. Leave the ice on for 20 minutes, 2-3 times a day. Remove the ice if your skin turns bright red. This is very important. If you cannot feel pain, heat, or cold, you have a greater risk of damage to the area. If directed, apply heat to the affected area as often as told by your health care provider. Use the heat source that your health care provider recommends, such as a moist heat pack or a heating pad. Place a towel between your skin and the heat source. Leave the heat on for 20-30 minutes. Remove the heat if your skin turns bright red. This is especially important if you are unable to feel pain, heat, or cold. You have a greater risk of getting burned. Activity  Do not stay in bed. Staying in  bed for more than 1-2 days can delay your recovery. Sit up and stand up straight. Avoid leaning forward when you sit or hunching over when you stand. If you work at a desk, sit close to it so you do not need to lean over. Keep your chin tucked in. Keep your neck drawn back, and keep your elbows bent at a 90-degree angle (right angle). Sit high and close to the steering wheel when you drive. Add lower back (lumbar) support to your car seat, if needed. Take short walks on even surfaces as soon as you are able. Try to increase the length of time you walk each day. Do not sit, drive, or stand in one place for more than 30 minutes at a time. Sitting or standing for long periods of time can put stress on your back. Do not drive or use heavy machinery while taking prescription pain medicine. Use proper lifting techniques. When you bend and lift, use positions that put less stress on your back: Bend your knees. Keep the load close to your body. Avoid twisting. Exercise regularly as told by your health care provider. Exercising helps your back heal faster and helps prevent back injuries by keeping muscles strong and flexible. Work with a physical therapist to make a safe exercise program, as recommended by your health care provider. Do any exercises as told by your physical therapist. Lifestyle Maintain a healthy weight. Extra weight puts stress on your back and makes it difficult to have good   posture. Avoid activities or situations that make you feel anxious or stressed. Stress and anxiety increase muscle tension and can make back pain worse. Learn ways to manage anxiety and stress, such as through exercise. General instructions Sleep on a firm mattress in a comfortable position. Try lying on your side with your knees slightly bent. If you lie on your back, put a pillow under your knees. Keep your head and neck in a straight line with your spine (neutral position) when using electronic equipment like  smartphones or pads. To do this: Raise your smartphone or pad to look at it instead of bending your head or neck to look down. Put the smartphone or pad at the level of your face while looking at the screen. Follow your treatment plan as told by your health care provider. This may include: Cognitive or behavioral therapy. Acupuncture or massage therapy. Meditation or yoga. Contact a health care provider if: You have pain that is not relieved with rest or medicine. You have increasing pain going down into your legs or buttocks. Your pain does not improve after 2 weeks. You have pain at night. You lose weight without trying. You have a fever or chills. You develop nausea or vomiting. You develop abdominal pain. Get help right away if: You develop new bowel or bladder control problems. You have unusual weakness or numbness in your arms or legs. You feel faint. These symptoms may represent a serious problem that is an emergency. Do not wait to see if the symptoms will go away. Get medical help right away. Call your local emergency services (911 in the [place] Do not drive yourself to the hospital. Summary Acute back pain is sudden and usually short-lived. Use proper lifting techniques. When you bend and lift, use positions that put less stress on your back. Take over-the-counter and prescription medicines only as told by your health care provider, and apply heat or ice as told. This information is not intended to replace advice given to you by your health care provider. Make sure you discuss any questions you have with your health care provider. Document Revised: 04/17/2020 Document Reviewed: 04/17/2020 Elsevier Patient Education  2024 [employer] Inc.  

## 2022-12-16 NOTE — Progress Notes (Signed)
   Subjective:    Patient ID: Ashley [name], female    DOB: 1950-11-27, 72 y.o.   MRN: [id number]   Chief Complaint: back pain  Back Pain This is a recurrent problem. The current episode started 1 to 4 weeks ago. The problem occurs intermittently. The problem has been waxing and waning since onset. The pain is present in the lumbar spine. The pain does not radiate. The pain is at a severity of 6/10. The pain is moderate. The symptoms are aggravated by standing and bending. Stiffness is present All day. Pertinent negatives include no abdominal pain, chest pain, headaches or weakness. Risk factors include obesity. She has tried heat and analgesics for the symptoms. The treatment provided mild relief.    Patient Active Problem List   Diagnosis Date Noted   Aortic valve stenosis, mild 03/22/2022   Persistent atrial fibrillation (HCC)    Severe obesity (BMI >= 40) (HCC) 07/30/2014   Hyperlipidemia 05/31/2010   HTN (hypertension) 05/31/2010   Diabetes mellitus treated with oral medication (HCC) 05/31/2010   Peripheral edema 05/31/2010       Review of Systems  Constitutional:  Negative for diaphoresis.  Eyes:  Negative for pain.  Respiratory:  Negative for shortness of breath.   Cardiovascular:  Negative for chest pain, palpitations and leg swelling.  Gastrointestinal:  Negative for abdominal pain.  Endocrine: Negative for polydipsia.  Musculoskeletal:  Positive for back pain.  Skin:  Negative for rash.  Neurological:  Negative for dizziness, weakness and headaches.  Hematological:  Does not bruise/bleed easily.  All other systems reviewed and are negative.      Objective:   Physical Exam Vitals reviewed.  Constitutional:      Appearance: Normal appearance. She is obese.  Cardiovascular:     Rate and Rhythm: Normal rate and regular rhythm.     Heart sounds: Normal heart sounds.  Pulmonary:     Effort: Pulmonary effort is normal.     Breath sounds: Normal breath sounds.   Skin:    General: Skin is warm.  Neurological:     General: No focal deficit present.     Mental Status: She is alert and oriented to person, place, and time.  Psychiatric:        Mood and Affect: Mood normal.        Behavior: Behavior normal.     BP (!) 161/86   Pulse 70   Temp 98.2 F (36.8 C) (Temporal)   Resp 20   Ht 5\' 2"  (1.575 m)   Wt 267 lb (121.1 kg)   SpO2 96%   BMI 48.83 kg/m        Assessment & Plan:   Ashley [name] in today with chief complaint of No chief complaint on file.   1. Acute midline low back pain without sciatica Moist heat Rest No bending or stooping - methylPREDNISolone acetate (DEPO-MEDROL) injection 80 mg - ketorolac (TORADOL) injection 60 mg - predniSONE (DELTASONE) 20 MG tablet; 2 po at sametime daily for 5 days-  Dispense: 10 tablet; Refill: 0    The above assessment and management plan was discussed with the patient. The patient verbalized understanding of and has agreed to the management plan. Patient is aware to call the clinic if symptoms persist or worsen. Patient is aware when to return to the clinic for a follow-up visit. Patient educated on when it is appropriate to go to the emergency department.   Mary-Margaret [name], FNP

## 2022-12-20 ENCOUNTER — Ambulatory Visit: Payer: Medicare HMO | Admitting: *Deleted

## 2022-12-20 DIAGNOSIS — E1169 Type 2 diabetes mellitus with other specified complication: Secondary | ICD-10-CM | POA: Diagnosis not present

## 2022-12-20 LAB — HM DIABETES EYE EXAM

## 2022-12-20 NOTE — Progress Notes (Signed)
Ashley [name] arrived 12/20/2022 and has given verbal consent to obtain images and complete their overdue diabetic retinal screening.  The images have been sent to an ophthalmologist or optometrist for review and interpretation.  Results will be sent back to Bennie Pierini, FNP for review.  Patient has been informed they will be contacted when we receive the results via telephone or MyChart

## 2022-12-22 DIAGNOSIS — H5203 Hypermetropia, bilateral: Secondary | ICD-10-CM | POA: Diagnosis not present

## 2023-01-07 ENCOUNTER — Other Ambulatory Visit: Payer: Self-pay | Admitting: Nurse Practitioner

## 2023-01-07 DIAGNOSIS — F411 Generalized anxiety disorder: Secondary | ICD-10-CM

## 2023-01-11 ENCOUNTER — Other Ambulatory Visit: Payer: Self-pay | Admitting: Nurse Practitioner

## 2023-01-19 ENCOUNTER — Other Ambulatory Visit: Payer: Self-pay | Admitting: Nurse Practitioner

## 2023-01-19 DIAGNOSIS — E782 Mixed hyperlipidemia: Secondary | ICD-10-CM

## 2023-01-25 ENCOUNTER — Other Ambulatory Visit: Payer: Self-pay | Admitting: Nurse Practitioner

## 2023-01-25 DIAGNOSIS — I4819 Other persistent atrial fibrillation: Secondary | ICD-10-CM

## 2023-01-25 DIAGNOSIS — I152 Hypertension secondary to endocrine disorders: Secondary | ICD-10-CM

## 2023-02-02 ENCOUNTER — Other Ambulatory Visit: Payer: Self-pay

## 2023-02-02 ENCOUNTER — Emergency Department (HOSPITAL_COMMUNITY): Payer: Medicare HMO

## 2023-02-02 ENCOUNTER — Inpatient Hospital Stay (HOSPITAL_COMMUNITY)
Admission: EM | Admit: 2023-02-02 | Discharge: 2023-02-07 | DRG: 378 | Disposition: A | Discharge status: Home / Self Care | Payer: Medicare HMO | Attending: Family Medicine | Admitting: Family Medicine

## 2023-02-02 ENCOUNTER — Encounter (HOSPITAL_COMMUNITY): Payer: Self-pay | Admitting: Emergency Medicine

## 2023-02-02 DIAGNOSIS — E8881 Metabolic syndrome: Secondary | ICD-10-CM | POA: Diagnosis present

## 2023-02-02 DIAGNOSIS — K7689 Other specified diseases of liver: Secondary | ICD-10-CM | POA: Diagnosis not present

## 2023-02-02 DIAGNOSIS — Z791 Long term (current) use of non-steroidal anti-inflammatories (NSAID): Secondary | ICD-10-CM

## 2023-02-02 DIAGNOSIS — N83201 Unspecified ovarian cyst, right side: Secondary | ICD-10-CM | POA: Diagnosis present

## 2023-02-02 DIAGNOSIS — N949 Unspecified condition associated with female genital organs and menstrual cycle: Secondary | ICD-10-CM | POA: Diagnosis present

## 2023-02-02 DIAGNOSIS — R935 Abnormal findings on diagnostic imaging of other abdominal regions, including retroperitoneum: Secondary | ICD-10-CM | POA: Diagnosis not present

## 2023-02-02 DIAGNOSIS — Z8249 Family history of ischemic heart disease and other diseases of the circulatory system: Secondary | ICD-10-CM | POA: Diagnosis not present

## 2023-02-02 DIAGNOSIS — K2970 Gastritis, unspecified, without bleeding: Secondary | ICD-10-CM | POA: Diagnosis not present

## 2023-02-02 DIAGNOSIS — Z83438 Family history of other disorder of lipoprotein metabolism and other lipidemia: Secondary | ICD-10-CM | POA: Diagnosis not present

## 2023-02-02 DIAGNOSIS — E119 Type 2 diabetes mellitus without complications: Secondary | ICD-10-CM | POA: Diagnosis present

## 2023-02-02 DIAGNOSIS — Z7901 Long term (current) use of anticoagulants: Secondary | ICD-10-CM | POA: Diagnosis not present

## 2023-02-02 DIAGNOSIS — Z7984 Long term (current) use of oral hypoglycemic drugs: Secondary | ICD-10-CM

## 2023-02-02 DIAGNOSIS — K274 Chronic or unspecified peptic ulcer, site unspecified, with hemorrhage: Secondary | ICD-10-CM

## 2023-02-02 DIAGNOSIS — R944 Abnormal results of kidney function studies: Secondary | ICD-10-CM | POA: Diagnosis present

## 2023-02-02 DIAGNOSIS — Z79899 Other long term (current) drug therapy: Secondary | ICD-10-CM | POA: Diagnosis not present

## 2023-02-02 DIAGNOSIS — K264 Chronic or unspecified duodenal ulcer with hemorrhage: Secondary | ICD-10-CM | POA: Diagnosis present

## 2023-02-02 DIAGNOSIS — R109 Unspecified abdominal pain: Secondary | ICD-10-CM | POA: Diagnosis not present

## 2023-02-02 DIAGNOSIS — K269 Duodenal ulcer, unspecified as acute or chronic, without hemorrhage or perforation: Secondary | ICD-10-CM | POA: Diagnosis not present

## 2023-02-02 DIAGNOSIS — K921 Melena: Secondary | ICD-10-CM

## 2023-02-02 DIAGNOSIS — D62 Acute posthemorrhagic anemia: Secondary | ICD-10-CM | POA: Diagnosis not present

## 2023-02-02 DIAGNOSIS — Z881 Allergy status to other antibiotic agents status: Secondary | ICD-10-CM

## 2023-02-02 DIAGNOSIS — N200 Calculus of kidney: Secondary | ICD-10-CM | POA: Diagnosis present

## 2023-02-02 DIAGNOSIS — Z6841 Body Mass Index (BMI) 40.0 and over, adult: Secondary | ICD-10-CM

## 2023-02-02 DIAGNOSIS — K7460 Unspecified cirrhosis of liver: Secondary | ICD-10-CM | POA: Diagnosis present

## 2023-02-02 DIAGNOSIS — I10 Essential (primary) hypertension: Secondary | ICD-10-CM | POA: Diagnosis present

## 2023-02-02 DIAGNOSIS — Z833 Family history of diabetes mellitus: Secondary | ICD-10-CM

## 2023-02-02 DIAGNOSIS — I4819 Other persistent atrial fibrillation: Secondary | ICD-10-CM | POA: Diagnosis present

## 2023-02-02 DIAGNOSIS — K2951 Unspecified chronic gastritis with bleeding: Secondary | ICD-10-CM | POA: Diagnosis not present

## 2023-02-02 DIAGNOSIS — R710 Precipitous drop in hematocrit: Secondary | ICD-10-CM | POA: Diagnosis not present

## 2023-02-02 DIAGNOSIS — E66813 Obesity, class 3: Secondary | ICD-10-CM | POA: Diagnosis present

## 2023-02-02 DIAGNOSIS — K922 Gastrointestinal hemorrhage, unspecified: Secondary | ICD-10-CM | POA: Diagnosis not present

## 2023-02-02 DIAGNOSIS — K3189 Other diseases of stomach and duodenum: Secondary | ICD-10-CM | POA: Diagnosis not present

## 2023-02-02 DIAGNOSIS — E1159 Type 2 diabetes mellitus with other circulatory complications: Secondary | ICD-10-CM | POA: Diagnosis present

## 2023-02-02 DIAGNOSIS — I152 Hypertension secondary to endocrine disorders: Secondary | ICD-10-CM

## 2023-02-02 DIAGNOSIS — E785 Hyperlipidemia, unspecified: Secondary | ICD-10-CM | POA: Diagnosis present

## 2023-02-02 DIAGNOSIS — D6832 Hemorrhagic disorder due to extrinsic circulating anticoagulants: Secondary | ICD-10-CM | POA: Diagnosis present

## 2023-02-02 DIAGNOSIS — E1169 Type 2 diabetes mellitus with other specified complication: Secondary | ICD-10-CM

## 2023-02-02 DIAGNOSIS — T45515A Adverse effect of anticoagulants, initial encounter: Secondary | ICD-10-CM | POA: Diagnosis present

## 2023-02-02 DIAGNOSIS — D649 Anemia, unspecified: Secondary | ICD-10-CM | POA: Diagnosis not present

## 2023-02-02 HISTORY — DX: Unspecified atrial fibrillation: I48.91

## 2023-02-02 LAB — URINALYSIS, ROUTINE W REFLEX MICROSCOPIC
Bacteria, UA: NONE SEEN
Bilirubin Urine: NEGATIVE
Glucose, UA: 150 mg/dL — AB
Ketones, ur: 80 mg/dL — AB
Leukocytes,Ua: NEGATIVE
Nitrite: NEGATIVE
Protein, ur: NEGATIVE mg/dL
Specific Gravity, Urine: 1.023 (ref 1.005–1.030)
pH: 5 (ref 5.0–8.0)

## 2023-02-02 LAB — CBC
HCT: 36.8 % (ref 36.0–46.0)
Hemoglobin: 12.1 g/dL (ref 12.0–15.0)
MCH: 32.2 pg (ref 26.0–34.0)
MCHC: 32.9 g/dL (ref 30.0–36.0)
MCV: 97.9 fL (ref 80.0–100.0)
Platelets: 269 10*3/uL (ref 150–400)
RBC: 3.76 MIL/uL — ABNORMAL LOW (ref 3.87–5.11)
RDW: 13.4 % (ref 11.5–15.5)
WBC: 12.1 10*3/uL — ABNORMAL HIGH (ref 4.0–10.5)
nRBC: 0 % (ref 0.0–0.2)

## 2023-02-02 LAB — COMPREHENSIVE METABOLIC PANEL
ALT: 20 U/L (ref 0–44)
AST: 28 U/L (ref 15–41)
Albumin: 3.6 g/dL (ref 3.5–5.0)
Alkaline Phosphatase: 40 U/L (ref 38–126)
Anion gap: 13 (ref 5–15)
BUN: 56 mg/dL — ABNORMAL HIGH (ref 8–23)
CO2: 21 mmol/L — ABNORMAL LOW (ref 22–32)
Calcium: 9.8 mg/dL (ref 8.9–10.3)
Chloride: 105 mmol/L (ref 98–111)
Creatinine, Ser: 0.6 mg/dL (ref 0.44–1.00)
GFR, Estimated: >60 mL/min (ref >60)
Glucose, Bld: 192 mg/dL — ABNORMAL HIGH (ref 70–99)
Potassium: 4.8 mmol/L (ref 3.5–5.1)
Sodium: 139 mmol/L (ref 135–145)
Total Bilirubin: 1.1 mg/dL (ref <1.2)
Total Protein: 7.3 g/dL (ref 6.5–8.1)

## 2023-02-02 LAB — GLUCOSE, CAPILLARY: Glucose-Capillary: 98 mg/dL (ref 70–99)

## 2023-02-02 LAB — LIPASE, BLOOD: Lipase: 27 U/L (ref 11–51)

## 2023-02-02 LAB — HEMOGLOBIN AND HEMATOCRIT, BLOOD
HCT: 29.5 % — ABNORMAL LOW (ref 36.0–46.0)
Hemoglobin: 9.7 g/dL — ABNORMAL LOW (ref 12.0–15.0)

## 2023-02-02 LAB — POC OCCULT BLOOD, ED: Occult Blood, Feces: POSITIVE — AB

## 2023-02-02 MED ORDER — METOPROLOL TARTRATE 50 MG PO TABS
50.0000 mg | ORAL_TABLET | Freq: Two times a day (BID) | ORAL | Status: DC
  Administered 2023-02-02 – 2023-02-07 (×9): 50 mg via ORAL
  Filled 2023-02-02 (×10): qty 1

## 2023-02-02 MED ORDER — ONDANSETRON HCL 4 MG/2ML IJ SOLN
4.0000 mg | Freq: Once | INTRAMUSCULAR | Status: AC
  Administered 2023-02-02: 4 mg via INTRAVENOUS
  Filled 2023-02-02: qty 2

## 2023-02-02 MED ORDER — ONDANSETRON HCL 4 MG/2ML IJ SOLN
4.0000 mg | Freq: Four times a day (QID) | INTRAMUSCULAR | Status: DC | PRN
  Administered 2023-02-03: 4 mg via INTRAVENOUS
  Filled 2023-02-02: qty 2

## 2023-02-02 MED ORDER — MORPHINE SULFATE (PF) 4 MG/ML IV SOLN
4.0000 mg | Freq: Once | INTRAVENOUS | Status: AC
  Administered 2023-02-02: 4 mg via INTRAVENOUS
  Filled 2023-02-02: qty 1

## 2023-02-02 MED ORDER — POLYETHYLENE GLYCOL 3350 17 G PO PACK: 17.0000 g | PACK | Freq: Every day | ORAL | Status: DC | PRN

## 2023-02-02 MED ORDER — LACTATED RINGERS IV BOLUS
1000.0000 mL | Freq: Once | INTRAVENOUS | Status: AC
  Administered 2023-02-02: 1000 mL via INTRAVENOUS

## 2023-02-02 MED ORDER — ACETAMINOPHEN 325 MG PO TABS
650.0000 mg | ORAL_TABLET | Freq: Four times a day (QID) | ORAL | Status: DC | PRN
Start: 2023-02-02 — End: 2023-02-07
  Administered 2023-02-03 – 2023-02-06 (×4): 650 mg via ORAL
  Filled 2023-02-02 (×4): qty 2

## 2023-02-02 MED ORDER — PANTOPRAZOLE SODIUM 40 MG IV SOLR
80.0000 mg | Freq: Once | INTRAVENOUS | Status: AC
  Administered 2023-02-02: 80 mg via INTRAVENOUS
  Filled 2023-02-02: qty 20

## 2023-02-02 MED ORDER — MORPHINE SULFATE (PF) 2 MG/ML IV SOLN: 2.0000 mg | INTRAVENOUS | Status: DC | PRN

## 2023-02-02 MED ORDER — ACETAMINOPHEN 650 MG RE SUPP: 650.0000 mg | Freq: Four times a day (QID) | RECTAL | Status: DC | PRN

## 2023-02-02 MED ORDER — IOHEXOL 300 MG/ML  SOLN
100.0000 mL | Freq: Once | INTRAMUSCULAR | Status: AC | PRN
  Administered 2023-02-02: 100 mL via INTRAVENOUS

## 2023-02-02 MED ORDER — INSULIN ASPART 100 UNIT/ML IJ SOLN
0.0000 [IU] | Freq: Four times a day (QID) | INTRAMUSCULAR | Status: DC
  Administered 2023-02-03: 2 [IU] via SUBCUTANEOUS
  Administered 2023-02-03: 3 [IU] via SUBCUTANEOUS
  Administered 2023-02-04 (×2): 2 [IU] via SUBCUTANEOUS
  Administered 2023-02-04: 1 [IU] via SUBCUTANEOUS
  Administered 2023-02-05 (×2): 3 [IU] via SUBCUTANEOUS
  Administered 2023-02-05: 5 [IU] via SUBCUTANEOUS
  Administered 2023-02-05 – 2023-02-06 (×2): 2 [IU] via SUBCUTANEOUS
  Administered 2023-02-06: 5 [IU] via SUBCUTANEOUS

## 2023-02-02 MED ORDER — ONDANSETRON HCL 4 MG PO TABS
4.0000 mg | ORAL_TABLET | Freq: Four times a day (QID) | ORAL | Status: DC | PRN
Start: 2023-02-02 — End: 2023-02-07

## 2023-02-02 MED ORDER — PANTOPRAZOLE SODIUM 40 MG IV SOLR
40.0000 mg | INTRAVENOUS | Status: DC
Start: 2023-02-03 — End: 2023-02-03
  Administered 2023-02-03: 40 mg via INTRAVENOUS
  Filled 2023-02-02: qty 10

## 2023-02-02 MED ORDER — SODIUM CHLORIDE 0.9 % IV SOLN
INTRAVENOUS | Status: AC
Start: 2023-02-02 — End: 2023-02-03

## 2023-02-02 NOTE — Assessment & Plan Note (Signed)
-   HgbA1c - SSI- S -Hold metformin, glipizide

## 2023-02-02 NOTE — ED Notes (Signed)
Pt and staff report Pt vomited a moderate amount of black emesis.  EDP made aware.

## 2023-02-02 NOTE — Assessment & Plan Note (Signed)
Rate controlled and on anticoagulation with Eliquis. -Resume metoprolol -Hold Eliquis in the setting of GI bleed

## 2023-02-02 NOTE — ED Notes (Signed)
Hospitalist at bedside 

## 2023-02-02 NOTE — ED Notes (Signed)
ED TO INPATIENT HANDOFF REPORT  ED Nurse Name and Phone #: Jacques Earthly Name/Age/Gender Ashley [name] 72 y.o. female Room/Bed: APA02/APA02  Code Status   Code Status: Full Code  Home/SNF/Other Home Patient oriented to: self, place, time, and situation Is this baseline? Yes   Triage Complete: Triage complete  Chief Complaint GI bleed [K92.2]  Triage Note Pt from home via [facility] with reports of left sided abdominal pain and dark stools that started this morning. Pt also reports feeling nauseas.    Allergies Allergies  Allergen Reactions   Cipro [Ciprofloxacin Hcl] Hives    Level of Care/Admitting Diagnosis ED Disposition     ED Disposition  Admit   Condition  --   Comment  Hospital Area: [facility] [[id number]]  Level of Care: Telemetry [5]  Covid Evaluation: Asymptomatic - no recent exposure (last 10 days) testing not required  Diagnosis: GI bleed [161096]  Admitting Physician: Onnie Boer [[id number]]  Attending Physician: Onnie Boer [email or web address]          B Medical/Surgery History Past Medical History:  Diagnosis Date   A-fib (HCC)    Depression    Diabetes mellitus without complication (HCC)    Heart disease    Hyperlipidemia    Hypertension    Hypertension    Past Surgical History:  Procedure Laterality Date   CYST EXCISION Left 03/15/2017   from buttocks   ORIF ANKLE FRACTURE Left 04/20/2017   Procedure: OPEN REDUCTION INTERNAL FIXATION (ORIF) LEFT ANKLE FRACTURE;  Surgeon: Beverely Low, MD;  Location: [facility] OR;  Service: Orthopedics;  Laterality: Left;     A IV Location/Drains/Wounds Patient Lines/Drains/Airways Status     Active Line/Drains/Airways     Name Placement date Placement time Site Days   Peripheral IV 02/02/23 18 G Anterior;Left;Proximal Forearm 02/02/23  1313  Forearm  less than 1   Incision (Closed) 04/20/17 Ankle Left 04/20/17  1359  -- 2114            Intake/Output Last 24 hours No intake  or output data in the 24 hours ending 02/02/23 1928  Labs/Imaging Results for orders placed or performed during the hospital encounter of 02/02/23 (from the past 48 hours)  CBC     Status: Abnormal   Collection Time: 02/02/23  1:16 PM  Result Value Ref Range   WBC 12.1 (H) 4.0 - 10.5 K/uL   RBC 3.76 (L) 3.87 - 5.11 MIL/uL   Hemoglobin 12.1 12.0 - 15.0 g/dL   HCT 09.8 11.9 - 14.7 %   MCV 97.9 80.0 - 100.0 fL   MCH 32.2 26.0 - 34.0 pg   MCHC 32.9 30.0 - 36.0 g/dL   RDW 82.9 56.2 - 13.0 %   Platelets 269 150 - 400 K/uL   nRBC 0.0 0.0 - 0.2 %    Comment: Performed at [facility] [address] [place], [place] [address]  Comprehensive metabolic panel     Status: Abnormal   Collection Time: 02/02/23  1:16 PM  Result Value Ref Range   Sodium 139 135 - 145 mmol/L   Potassium 4.8 3.5 - 5.1 mmol/L   Chloride 105 98 - 111 mmol/L   CO2 21 (L) 22 - 32 mmol/L   Glucose, Bld 192 (H) 70 - 99 mg/dL    Comment: Glucose reference range applies only to samples taken after fasting for at least 8 hours.   BUN 56 (H) 8 - 23 mg/dL   Creatinine, Ser 4.69 0.44 -  1.00 mg/dL   Calcium 9.8 8.9 - 16.1 mg/dL   Total Protein 7.3 6.5 - 8.1 g/dL   Albumin 3.6 3.5 - 5.0 g/dL   AST 28 15 - 41 U/L   ALT 20 0 - 44 U/L   Alkaline Phosphatase 40 38 - 126 U/L   Total Bilirubin 1.1 <1.2 mg/dL   GFR, Estimated >09 >60 mL/min    Comment: (NOTE) Calculated using the CKD-EPI Creatinine Equation (2021)    Anion gap 13 5 - 15    Comment: Performed at [facility], [address], [place], [place] [address]  Lipase, blood     Status: None   Collection Time: 02/02/23  1:16 PM  Result Value Ref Range   Lipase 27 11 - 51 U/L    Comment: Performed at [facility], [address], [place], [place] [address]  Urinalysis, Routine w reflex microscopic -Urine, Clean Catch     Status: Abnormal   Collection Time: 02/02/23  1:34 PM  Result Value Ref Range   Color, Urine STRAW (A) YELLOW   APPearance CLEAR CLEAR   Specific  Gravity, Urine 1.023 1.005 - 1.030   pH 5.0 5.0 - 8.0   Glucose, UA 150 (A) NEGATIVE mg/dL   Hgb urine dipstick SMALL (A) NEGATIVE   Bilirubin Urine NEGATIVE NEGATIVE   Ketones, ur 80 (A) NEGATIVE mg/dL   Protein, ur NEGATIVE NEGATIVE mg/dL   Nitrite NEGATIVE NEGATIVE   Leukocytes,Ua NEGATIVE NEGATIVE   RBC / HPF 0-5 0 - 5 RBC/hpf   WBC, UA 0-5 0 - 5 WBC/hpf   Bacteria, UA NONE SEEN NONE SEEN   Squamous Epithelial / HPF 0-5 0 - 5 /HPF   Mucus PRESENT     Comment: Performed at [facility], [address], [place], [place] [address]  POC occult blood, ED Provider will collect     Status: Abnormal   Collection Time: 02/02/23  1:54 PM  Result Value Ref Range   Occult Blood, Feces Positive (A)    CT ABDOMEN PELVIS W CONTRAST Result Date: 02/02/2023 CLINICAL DATA:  Left-sided abdominal pain, nausea EXAM: CT ABDOMEN AND PELVIS WITH CONTRAST TECHNIQUE: Multidetector CT imaging of the abdomen and pelvis was performed using the standard protocol following bolus administration of intravenous contrast. RADIATION DOSE REDUCTION: This exam was performed according to the departmental dose-optimization program which includes automated exposure control, adjustment of the mA and/or kV according to patient size and/or use of iterative reconstruction technique. CONTRAST:  OMNIPAQUE IOHEXOL 300 MG/ML  SOLN COMPARISON:  None Available. FINDINGS: Lower chest: No pleural or pericardial effusion. Coarse aortic calcifications. Visualized lung bases clear. Hepatobiliary: Mildly nodular hepatic contour. No calcified gallstones. No biliary ductal dilatation. Gallbladder nondistended. Subcentimeter coarse calcification in hepatic segment 7. Pancreas: Unremarkable. No pancreatic ductal dilatation or surrounding inflammatory changes. Spleen: Normal in size without focal abnormality. Adrenals/Urinary Tract: No adrenal mass. Bilateral urolithiasis, largest 5 mm left lower pole, on the right26mm lower pole collecting  system. No hydronephrosis or ureteral calculus. Urinary bladder incompletely distended. Stomach/Bowel: Stomach physiologically distended by ingested fluid. Small bowel decompressed. Normal appendix. The colon is partially distended, without acute finding. Vascular/Lymphatic: Scattered calcified aortic plaque without AAA. Portal vein patent. No abdominal or pelvic adenopathy. Reproductive: 5 cm -1 HU simple appearing right adnexal cystic process. Uterus unremarkable. Other: No ascites.  No free air. Musculoskeletal: Partially calcified subcutaneous injection granulomas in the anterior abdominal wall. 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 IMPRESSION: 1. No  acute findings. 2. Bilateral nephrolithiasis. 3. 5 cm right adnexal cyst. Recommend follow-up US in 6-12 months. Note: This recommendation does not apply to premenarchal patients and to those with increased risk (genetic, family history, elevated tumor markers or other high-risk factors) of ovarian cancer. Reference: JACR 2020 Feb; 17(2):248-254 4. Mildly nodular hepatic contour, suggesting cirrhosis. 5.  Aortic Atherosclerosis (ICD10-I70.0). Electronically Signed   By: Corlis Leak M.D.   On: 02/02/2023 15:17    Pending Labs Unresulted Labs (From admission, onward)     Start     Ordered   02/02/23 2000  Hemoglobin and hematocrit, blood  Once,   R        02/02/23 1647   02/02/23 1634  Hemoglobin A1c  Add-on,   AD        02/02/23 1633   Signed and Held  HIV Antibody (routine testing w rflx)  (HIV Antibody (Routine testing w reflex) panel)  Tomorrow morning,   R        Signed and Held   Signed and Held  CBC  Tomorrow morning,   R        Signed and Held   Signed and Held  Basic metabolic panel  Tomorrow morning,   R        Signed and Held            Vitals/Pain Today's Vitals   02/02/23 1800 02/02/23 1830 02/02/23 1900 02/02/23 1917  BP: (!) 103/54 (!) 117/57 102/60   Pulse: 68 71 73   Resp: 16  16 14    Temp:   (!) 97.2 F (36.2 C)   TempSrc:   Axillary   SpO2: 96% 96% 96%   PainSc:    7     Isolation Precautions No active isolations  Medications Medications  lactated ringers bolus 1,000 mL (0 mLs Intravenous Stopped 02/02/23 1440)  morphine (PF) 4 MG/ML injection 4 mg (4 mg Intravenous Given 02/02/23 1343)  ondansetron (ZOFRAN) injection 4 mg (4 mg Intravenous Given 02/02/23 1341)  iohexol (OMNIPAQUE) 300 MG/ML solution 100 mL (100 mLs Intravenous Contrast Given 02/02/23 1358)  pantoprazole (PROTONIX) injection 80 mg (80 mg Intravenous Given 02/02/23 1431)  ondansetron (ZOFRAN) injection 4 mg (4 mg Intravenous Given 02/02/23 1608)    Mobility walks     Focused Assessments    R Recommendations: See Admitting Provider Note  Report given to:   Additional Notes: A&O; Ambu; 18G LFA(ultrasound)

## 2023-02-02 NOTE — Assessment & Plan Note (Addendum)
Stable. -Hold clonidine 0.2, lisinopril 40 mg, and Lasix 20 mg in the setting of GI bleed -Resume metoprolol 50 twice daily for atrial fibrillation

## 2023-02-02 NOTE — Assessment & Plan Note (Addendum)
Likely upper GI bleed, presenting with melena and hematemesis.  On anticoagulation with Eliquis.  Also on Celebrex twice daily.  Hemoglobin stable at 12.1 (Baseline 12-13).  CT abdomen and pelvis with contrast without acute findings shows bilateral nephrolithiasis.  5 cm right adnexal cyst follow-up recommended. -IV Protonix 40 twice daily -N.p.o. - Trend Hgb - IV morphine 2mg  Q4hr prn -1 L bolus given, continue N/s 50cc/hr x 15 hrs -Hold Eliquis last dose was this morning -Hold Norvasc, clonidine 0.2, Lasix 20, lisinopril 40 in the setting of GI bleed

## 2023-02-02 NOTE — H&P (Addendum)
History and Physical    Ashley [name] JXB:[id number] DOB: 12/16/1950 DOA: 02/02/2023  PCP: Bennie Pierini, FNP   Patient coming from: Home  I have personally briefly reviewed patient's old medical records in [facility]  Chief Complaint: Black Stools  HPI: Ashley [name] is a 72 y.o. female with medical history significant for diabetes mellitus, hypertension, atrial fibrillation on chronic anticoagulation, AV stenosis. Patient presented to the ED with complaints of left-sided abdominal pain and onset of black stools today.  She reports she has never had stools or dark in the past.  No prior vomiting until here in the ED, she had a large volume of coffee-ground emesis. She takes Celebrex for pain, but denies other NSAID use.  She is on Eliquis twice daily her last dose was this morning.  ED Course: Temperature 98.7.  Heart rate 82-118.  Respiratory presents with 19.  Blood pressure systolic 104-155.  Sats greater than 95% on room air.  Hemoglobin 12.1.  WBC 12.1.  UA not suggestive of UTI.   CT abdomen and pelvis with contrast-no acute findings, bilateral nephrolithiasis, 5 cm right adnexal cyst-ultrasound recommended in 6 to 12 months. EDP talked to GI recommended admission will see in consult in a.m. for possible EGD, Protonix twice daily.  Review of Systems: As per HPI all other systems reviewed and negative.  Past Medical History:  Diagnosis Date   A-fib [facility])    Depression    Diabetes mellitus without complication (HCC)    Heart disease    Hyperlipidemia    Hypertension    Hypertension     Past Surgical History:  Procedure Laterality Date   CYST EXCISION Left 03/15/2017   from buttocks   ORIF ANKLE FRACTURE Left 04/20/2017   Procedure: OPEN REDUCTION INTERNAL FIXATION (ORIF) LEFT ANKLE FRACTURE;  Surgeon: Beverely Low, MD;  Location: [facility] OR;  Service: Orthopedics;  Laterality: Left;     reports that she has never smoked. She has never used smokeless tobacco. She  reports that she does not drink alcohol and does not use drugs.  Allergies  Allergen Reactions   Ciprofloxacin Hcl Hives    Family History  Problem Relation Age of Onset   Diabetes Mother    CAD Mother        CABG at age 45   Hypertension Mother    Hyperlipidemia Mother    Diabetes Father    Heart disease Father        CHF   Hypertension Father    Diabetes Sister    CAD Sister    Hypertension Sister    Diabetes Brother    Hypertension Brother    Healthy Sister     Prior to Admission medications   Medication Sig Start Date End Date Taking? Authorizing Provider  Accu-Chek Softclix Lancets lancets TEST BLOOD SUGAR TWICE DAILY 04/04/22   [name], Mary-Margaret, FNP  acetaminophen (TYLENOL) 500 MG tablet Take 1,000 mg by mouth every 6 (six) hours as needed (for pain/headaches.).    [provider]  albuterol (VENTOLIN HFA) 108 (90 Base) MCG/ACT inhaler INHALE 2 PUFFS INTO THE LUNGS EVERY 6 HOURS AS NEEDED FOR WHEEZE OR SHORTNESS OF BREATH 01/11/23   Bennie Pierini, FNP  Alcohol Swabs (DROPSAFE ALCOHOL PREP) 70 % PADS CHECK BLOOD SUGAR TWICE DAILY 04/04/22   [name], Mary-Margaret, FNP  ALPRAZolam Prudy Feeler) 0.25 MG tablet TAKE 1 TABLET DAILY AS NEEDED FOR ANXIETY 01/09/23   [name], Mary-Margaret, FNP  amLODipine (NORVASC) 5 MG tablet TAKE 1 TABLET EVERY DAY  01/26/23   [name] Mary-Margaret, FNP  apixaban (ELIQUIS) 5 MG TABS tablet Take 1 tablet (5 mg total) by mouth 2 (two) times daily. 06/17/22   [name], Mary-Margaret, FNP  Ascorbic Acid (VITAMIN C) 1000 MG tablet Take 1,000 mg by mouth daily.    [provider]  Blood Glucose Monitoring Suppl (ACCU-CHEK GUIDE ME) w/Device KIT TEST BLOOD SUGAR TWICE DAILY Dx E11.9 12/01/21   Bennie Pierini, FNP  celecoxib (CELEBREX) 200 MG capsule TAKE 1 CAPSULE TWICE DAILY 12/15/22   [name], Mary-Margaret, FNP  Cholecalciferol (VITAMIN D3) 1000 units CAPS Take 1 capsule by mouth daily.    [provider]  CINNAMON PO  Take 1 capsule by mouth daily.     [provider]  cloNIDine (CATAPRES) 0.2 MG tablet Take 1 tablet (0.2 mg total) by mouth 2 (two) times daily. 06/17/22   [name], Mary-Margaret, FNP  furosemide (LASIX) 20 MG tablet Take 1 tablet (20 mg total) by mouth daily. 06/17/22   [name], Mary-Margaret, FNP  Ginger, Zingiber officinalis, (GINGER ROOT PO) Take 1 capsule by mouth daily.     [provider]  glipiZIDE (GLUCOTROL) 5 MG tablet Take 1 tablet (5 mg total) by mouth 2 (two) times daily. 06/17/22   [name], Mary-Margaret, FNP  glucose blood (ACCU-CHEK AVIVA PLUS) test strip TEST BLOOD SUGAR TWICE DAILY 04/04/22   [name], Mary-Margaret, FNP  lisinopril (ZESTRIL) 40 MG tablet Take 1 tablet (40 mg total) by mouth daily. 06/17/22   Bennie Pierini, FNP  metFORMIN (GLUCOPHAGE) 1000 MG tablet Take 1 tablet (1,000 mg total) by mouth 2 (two) times daily with a meal. 06/17/22   [name], Mary-Margaret, FNP  metoprolol tartrate (LOPRESSOR) 50 MG tablet TAKE 1 TABLET TWICE DAILY 01/26/23   [name], Mary-Margaret, FNP  Multiple Vitamins-Minerals (ZINC PO) Take 1 tablet by mouth daily.    [provider]  Omega-3 Fatty Acids (FISH OIL) 1000 MG CAPS Take 1 capsule by mouth daily.    [provider]  predniSONE (DELTASONE) 20 MG tablet 2 po at sametime daily for 5 days- 12/16/22   Bennie Pierini, FNP  simvastatin (ZOCOR) 40 MG tablet TAKE 1 TABLET EVERY EVENING 01/19/23   [name], Mary-Margaret, FNP  Turmeric Curcumin 500 MG CAPS Take 500 mg by mouth daily.     [provider]    Physical Exam: Vitals:   02/02/23 1335 02/02/23 1430 02/02/23 1500 02/02/23 1530  BP: 119/67 (!) 151/75 119/69 (!) 113/57  Pulse: 90 84 82 84  Resp: 18 18 16 13   Temp:      TempSrc:      SpO2: 96% 97% 95% 96%    Constitutional: NAD, calm, comfortable Vitals:   02/02/23 1335 02/02/23 1430 02/02/23 1500 02/02/23 1530  BP: 119/67 (!) 151/75 119/69 (!) 113/57  Pulse: 90 84 82 84  Resp:  18 18 16 13   Temp:      TempSrc:      SpO2: 96% 97% 95% 96%   Eyes: PERRL, lids and conjunctivae normal ENMT: Mucous membranes are dry  Neck: normal, supple, no masses, no thyromegaly Respiratory: clear to auscultation bilaterally, no wheezing, no crackles. Normal respiratory effort. No accessory muscle use.  Cardiovascular: Regular rate and rhythm, 3/6 known systolic murmurs loudest aortic area/ rubs / gallops.  Chronic trace bilateral pitting extremity edema.  Extremities warm. Abdomen: Moderate tenderness to left upper abdomen, soft, no guarding, no masses palpated. No hepatosplenomegaly. Bowel sounds positive.  Musculoskeletal: no clubbing / cyanosis. No joint deformity upper and lower extremities.  Skin: no rashes, lesions, ulcers. No induration Neurologic: No facial asymmetry, moving extremities spontaneously, speech fluent Psychiatric: Normal judgment and insight. Alert and oriented x 3. Normal mood.   Labs on Admission: I have personally reviewed following labs and imaging studies  CBC: Recent Labs  Lab 02/02/23 1316  WBC 12.1*  HGB 12.1  HCT 36.8  MCV 97.9  PLT 269   Basic Metabolic Panel: Recent Labs  Lab 02/02/23 1316  NA 139  K 4.8  CL 105  CO2 21*  GLUCOSE 192*  BUN 56*  CREATININE 0.60  CALCIUM 9.8   GFR: CrCl cannot be calculated (Unknown ideal weight.). Liver Function Tests: Recent Labs  Lab 02/02/23 1316  AST 28  ALT 20  ALKPHOS 40  BILITOT 1.1  PROT 7.3  ALBUMIN 3.6   Recent Labs  Lab 02/02/23 1316  LIPASE 27   Urine analysis:    Component Value Date/Time   COLORURINE STRAW (A) 02/02/2023 1334   APPEARANCEUR CLEAR 02/02/2023 1334   APPEARANCEUR Clear 12/17/2021 1041   LABSPEC 1.023 02/02/2023 1334   PHURINE 5.0 02/02/2023 1334   GLUCOSEU 150 (A) 02/02/2023 1334   HGBUR SMALL (A) 02/02/2023 1334   BILIRUBINUR NEGATIVE 02/02/2023 1334   BILIRUBINUR Negative 12/17/2021 1041   KETONESUR 80 (A) 02/02/2023 1334   PROTEINUR NEGATIVE  02/02/2023 1334   NITRITE NEGATIVE 02/02/2023 1334   LEUKOCYTESUR NEGATIVE 02/02/2023 1334    Radiological Exams on Admission: CT ABDOMEN PELVIS W CONTRAST Result Date: 02/02/2023 CLINICAL DATA:  Left-sided abdominal pain, nausea EXAM: CT ABDOMEN AND PELVIS WITH CONTRAST TECHNIQUE: Multidetector CT imaging of the abdomen and pelvis was performed using the standard protocol following bolus administration of intravenous contrast. RADIATION DOSE REDUCTION: This exam was performed according to the departmental dose-optimization program which includes automated exposure control, adjustment of the mA and/or kV according to patient size and/or use of iterative reconstruction technique. CONTRAST:  OMNIPAQUE IOHEXOL 300 MG/ML  SOLN COMPARISON:  None Available. FINDINGS: Lower chest: No pleural or pericardial effusion. Coarse aortic calcifications. Visualized lung bases clear. Hepatobiliary: Mildly nodular hepatic contour. No calcified gallstones. No biliary ductal dilatation. Gallbladder nondistended. Subcentimeter coarse calcification in hepatic segment 7. Pancreas: Unremarkable. No pancreatic ductal dilatation or surrounding inflammatory changes. Spleen: Normal in size without focal abnormality. Adrenals/Urinary Tract: No adrenal mass. Bilateral urolithiasis, largest 5 mm left lower pole, on the right15mm lower pole collecting system. No hydronephrosis or ureteral calculus. Urinary bladder incompletely distended. Stomach/Bowel: Stomach physiologically distended by ingested fluid. Small bowel decompressed. Normal appendix. The colon is partially distended, without acute finding. Vascular/Lymphatic: Scattered calcified aortic plaque without AAA. Portal vein patent. No abdominal or pelvic adenopathy. Reproductive: 5 cm -1 HU simple appearing right adnexal cystic process. Uterus unremarkable. Other: No ascites.  No free air. Musculoskeletal: Partially calcified subcutaneous injection granulomas in the anterior  abdominal wall. 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 IMPRESSION: 1. No acute findings. 2. Bilateral nephrolithiasis. 3. 5 cm right adnexal cyst. Recommend follow-up US in 6-12 months. Note: This recommendation does not apply to premenarchal patients and to those with increased risk (genetic, family history, elevated tumor markers or other high-risk factors) of ovarian cancer. Reference: JACR 2020 Feb; 17(2):248-254 4. Mildly nodular hepatic contour, suggesting cirrhosis. 5.  Aortic Atherosclerosis (ICD10-I70.0). Electronically Signed   By: Corlis Leak M.D.   On: 02/02/2023 15:17    EKG: None  Assessment/Plan Principal Problem:   GI bleed Active Problems:   Persistent atrial fibrillation (HCC)  HTN (hypertension)   Diabetes mellitus treated with oral medication (HCC)   Assessment and Plan: * GI bleed Likely upper GI bleed, presenting with melena and hematemesis.  On anticoagulation with Eliquis.  Also on Celebrex twice daily.  Hemoglobin stable at 12.1 (Baseline 12-13).  CT abdomen and pelvis with contrast without acute findings shows bilateral nephrolithiasis.  5 cm right adnexal cyst follow-up recommended. -IV Protonix 40 twice daily -N.p.o. - Trend Hgb - IV morphine 2mg  Q4hr prn -1 L bolus given, continue N/s 50cc/hr x 15 hrs -Hold Eliquis last dose was this morning -Hold Norvasc, clonidine 0.2, Lasix 20, lisinopril 40 in the setting of GI bleed   Persistent atrial fibrillation (HCC) Rate controlled and on anticoagulation with Eliquis. -Resume metoprolol -Hold Eliquis in the setting of GI bleed  Adnexal cyst Incidental finding on CT abdomen and pelvis with contrast/ 02/02/23-  5 cm right adnexal cyst. Recommend follow-up US in 6-12 months. Note: This recommendation does not apply to premenarchal patients and to those with increased risk (genetic, family history, elevated tumor markers or other high-risk factors) of ovarian  cancer. -Follow-up as outpatient  Diabetes mellitus treated with oral medication (HCC) - HgbA1c - SSI- S -Hold metformin, glipizide  HTN (hypertension) Stable. -Hold clonidine 0.2, lisinopril 40 mg, and Lasix 20 mg in the setting of GI bleed -Resume metoprolol 50 twice daily for atrial fibrillation   DVT prophylaxis: SCDS Code Status: FULL-confirmed with patient and daughter at bedside Family Communication: Daughter Research scientist (physical sciences) at bedside Disposition Plan: ~ 2 days Consults called: GI Admission status: Obs tele    Author: Onnie Boer, MD 02/02/2023 4:49 PM  For on call review [email or web address].

## 2023-02-02 NOTE — ED Notes (Signed)
Occult blood was positive.

## 2023-02-02 NOTE — Assessment & Plan Note (Signed)
Incidental finding on CT abdomen and pelvis with contrast/ 02/02/23-  5 cm right adnexal cyst. Recommend follow-up US in 6-12 months. Note: This recommendation does not apply to premenarchal patients and to those with increased risk (genetic, family history, elevated tumor markers or other high-risk factors) of ovarian cancer. -Follow-up as outpatient

## 2023-02-02 NOTE — ED Triage Notes (Signed)
Pt from home via [facility] with reports of left sided abdominal pain and dark stools that started this morning. Pt also reports feeling nauseas.

## 2023-02-02 NOTE — ED Provider Notes (Signed)
[place] EMERGENCY DEPARTMENT AT [facility] Provider Note   CSN: [id number] Arrival date & time: 02/02/23  1247     History  Chief Complaint  Patient presents with   Abdominal Pain    Ashley [name] is a 72 y.o. female.  Pt with c/o left abd pain in past 1-2 weeks. Pain constant, dull, non radiating. Denies hx diverticulitis. No hematuria or dysuria. No back pain. No fever or chills. Stats today stools very dark, almost black. Denies iron or peptobismal use. Is on eliquis. No faintness.  Nausea. No vomiting.   The history is provided by the patient, medical records and the EMS personnel.  Abdominal Pain Associated symptoms: nausea   Associated symptoms: no chest pain, no chills, no constipation, no cough, no diarrhea, no dysuria, no fever, no shortness of breath, no sore throat, no vaginal bleeding and no vaginal discharge        Home Medications Prior to Admission medications   Medication Sig Start Date End Date Taking? Authorizing Provider  Accu-Chek Softclix Lancets lancets TEST BLOOD SUGAR TWICE DAILY 04/04/22   [name], Mary-Margaret, FNP  acetaminophen (TYLENOL) 500 MG tablet Take 1,000 mg by mouth every 6 (six) hours as needed (for pain/headaches.).    [provider]  albuterol (VENTOLIN HFA) 108 (90 Base) MCG/ACT inhaler INHALE 2 PUFFS INTO THE LUNGS EVERY 6 HOURS AS NEEDED FOR WHEEZE OR SHORTNESS OF BREATH 01/11/23   Bennie Pierini, FNP  Alcohol Swabs (DROPSAFE ALCOHOL PREP) 70 % PADS CHECK BLOOD SUGAR TWICE DAILY 04/04/22   [name], Mary-Margaret, FNP  ALPRAZolam Prudy Feeler) 0.25 MG tablet TAKE 1 TABLET DAILY AS NEEDED FOR ANXIETY 01/09/23   [name], Mary-Margaret, FNP  amLODipine (NORVASC) 5 MG tablet TAKE 1 TABLET EVERY DAY 01/26/23   [name], Mary-Margaret, FNP  apixaban (ELIQUIS) 5 MG TABS tablet Take 1 tablet (5 mg total) by mouth 2 (two) times daily. 06/17/22   [name], Mary-Margaret, FNP  Ascorbic Acid (VITAMIN C) 1000 MG tablet Take 1,000 mg by  mouth daily.    [provider]  Blood Glucose Monitoring Suppl (ACCU-CHEK GUIDE ME) w/Device KIT TEST BLOOD SUGAR TWICE DAILY Dx E11.9 12/01/21   Bennie Pierini, FNP  celecoxib (CELEBREX) 200 MG capsule TAKE 1 CAPSULE TWICE DAILY 12/15/22   [name], Mary-Margaret, FNP  Cholecalciferol (VITAMIN D3) 1000 units CAPS Take 1 capsule by mouth daily.    [provider]  CINNAMON PO Take 1 capsule by mouth daily.     [provider]  cloNIDine (CATAPRES) 0.2 MG tablet Take 1 tablet (0.2 mg total) by mouth 2 (two) times daily. 06/17/22   [name], Mary-Margaret, FNP  furosemide (LASIX) 20 MG tablet Take 1 tablet (20 mg total) by mouth daily. 06/17/22   [name], Mary-Margaret, FNP  Ginger, Zingiber officinalis, (GINGER ROOT PO) Take 1 capsule by mouth daily.     [provider]  glipiZIDE (GLUCOTROL) 5 MG tablet Take 1 tablet (5 mg total) by mouth 2 (two) times daily. 06/17/22   [name], Mary-Margaret, FNP  glucose blood (ACCU-CHEK AVIVA PLUS) test strip TEST BLOOD SUGAR TWICE DAILY 04/04/22   [name], Mary-Margaret, FNP  lisinopril (ZESTRIL) 40 MG tablet Take 1 tablet (40 mg total) by mouth daily. 06/17/22   Bennie Pierini, FNP  metFORMIN (GLUCOPHAGE) 1000 MG tablet Take 1 tablet (1,000 mg total) by mouth 2 (two) times daily with a meal. 06/17/22   [name], Mary-Margaret, FNP  metoprolol tartrate (LOPRESSOR) 50 MG tablet TAKE 1 TABLET TWICE DAILY 01/26/23   [name], Mary-Margaret,  FNP  Multiple Vitamins-Minerals (ZINC PO) Take 1 tablet by mouth daily.    [provider]  Omega-3 Fatty Acids (FISH OIL) 1000 MG CAPS Take 1 capsule by mouth daily.    [provider]  predniSONE (DELTASONE) 20 MG tablet 2 po at sametime daily for 5 days- 12/16/22   Bennie Pierini, FNP  simvastatin (ZOCOR) 40 MG tablet TAKE 1 TABLET EVERY EVENING 01/19/23   [name], Mary-Margaret, FNP  Turmeric Curcumin 500 MG CAPS Take 500 mg by mouth daily.     [provider]       Allergies    Ciprofloxacin hcl    Review of Systems   Review of Systems  Constitutional:  Negative for chills and fever.  HENT:  Negative for sore throat.   Eyes:  Negative for redness.  Respiratory:  Negative for cough and shortness of breath.   Cardiovascular:  Negative for chest pain.  Gastrointestinal:  Positive for abdominal pain and nausea. Negative for constipation and diarrhea.  Genitourinary:  Negative for dysuria, flank pain, vaginal bleeding and vaginal discharge.  Musculoskeletal:  Negative for back pain.  Neurological:  Negative for light-headedness and headaches.  Psychiatric/Behavioral:  Negative for confusion.     Physical Exam Updated Vital Signs BP 119/69   Pulse 82   Temp 98.7 F (37.1 C) (Oral)   Resp 16   SpO2 95%  Physical Exam Vitals and nursing note reviewed.  Constitutional:      Appearance: Normal appearance. She is well-developed.  HENT:     Head: Atraumatic.     Nose: Nose normal.     Mouth/Throat:     Mouth: Mucous membranes are moist.  Eyes:     General: No scleral icterus.    Conjunctiva/sclera: Conjunctivae normal.  Neck:     Trachea: No tracheal deviation.  Cardiovascular:     Rate and Rhythm: Normal rate and regular rhythm.     Pulses: Normal pulses.     Heart sounds: Normal heart sounds. No murmur heard.    No friction rub. No gallop.  Pulmonary:     Effort: Pulmonary effort is normal. No respiratory distress.     Breath sounds: Normal breath sounds.  Abdominal:     General: Bowel sounds are normal. There is no distension.     Palpations: Abdomen is soft.     Tenderness: There is abdominal tenderness. There is no guarding.     Comments: Left abd tenderness.   Genitourinary:    Comments: No cva tenderness. Rectal, chaperoned exam with RN and daughter present - stool is black, sent for hemoccult.  Musculoskeletal:        General: No swelling or tenderness.     Cervical back: Normal range of motion and neck supple. No  rigidity. No muscular tenderness.  Skin:    General: Skin is warm and dry.     Findings: No rash.  Neurological:     Mental Status: She is alert.     Comments: Alert, speech normal.   Psychiatric:        Mood and Affect: Mood normal.     ED Results / Procedures / Treatments   Labs (all labs ordered are listed, but only abnormal results are displayed) Results for orders placed or performed during the hospital encounter of 02/02/23  CBC   Collection Time: 02/02/23  1:16 PM  Result Value Ref Range   WBC 12.1 (H) 4.0 - 10.5 K/uL   RBC 3.76 (L) 3.87 - 5.11 MIL/uL  Hemoglobin 12.1 12.0 - 15.0 g/dL   HCT 40.9 81.1 - 91.4 %   MCV 97.9 80.0 - 100.0 fL   MCH 32.2 26.0 - 34.0 pg   MCHC 32.9 30.0 - 36.0 g/dL   RDW 78.2 95.6 - 21.3 %   Platelets 269 150 - 400 K/uL   nRBC 0.0 0.0 - 0.2 %  Comprehensive metabolic panel   Collection Time: 02/02/23  1:16 PM  Result Value Ref Range   Sodium 139 135 - 145 mmol/L   Potassium 4.8 3.5 - 5.1 mmol/L   Chloride 105 98 - 111 mmol/L   CO2 21 (L) 22 - 32 mmol/L   Glucose, Bld 192 (H) 70 - 99 mg/dL   BUN 56 (H) 8 - 23 mg/dL   Creatinine, Ser 0.86 0.44 - 1.00 mg/dL   Calcium 9.8 8.9 - 57.8 mg/dL   Total Protein 7.3 6.5 - 8.1 g/dL   Albumin 3.6 3.5 - 5.0 g/dL   AST 28 15 - 41 U/L   ALT 20 0 - 44 U/L   Alkaline Phosphatase 40 38 - 126 U/L   Total Bilirubin 1.1 <1.2 mg/dL   GFR, Estimated >46 >96 mL/min   Anion gap 13 5 - 15  Lipase, blood   Collection Time: 02/02/23  1:16 PM  Result Value Ref Range   Lipase 27 11 - 51 U/L  Urinalysis, Routine w reflex microscopic -Urine, Clean Catch   Collection Time: 02/02/23  1:34 PM  Result Value Ref Range   Color, Urine STRAW (A) YELLOW   APPearance CLEAR CLEAR   Specific Gravity, Urine 1.023 1.005 - 1.030   pH 5.0 5.0 - 8.0   Glucose, UA 150 (A) NEGATIVE mg/dL   Hgb urine dipstick SMALL (A) NEGATIVE   Bilirubin Urine NEGATIVE NEGATIVE   Ketones, ur 80 (A) NEGATIVE mg/dL   Protein, ur NEGATIVE  NEGATIVE mg/dL   Nitrite NEGATIVE NEGATIVE   Leukocytes,Ua NEGATIVE NEGATIVE   RBC / HPF 0-5 0 - 5 RBC/hpf   WBC, UA 0-5 0 - 5 WBC/hpf   Bacteria, UA NONE SEEN NONE SEEN   Squamous Epithelial / HPF 0-5 0 - 5 /HPF   Mucus PRESENT   POC occult blood, ED Provider will collect   Collection Time: 02/02/23  1:54 PM  Result Value Ref Range   Occult Blood, Feces Positive (A)      EKG None  Radiology CT ABDOMEN PELVIS W CONTRAST Result Date: 02/02/2023 CLINICAL DATA:  Left-sided abdominal pain, nausea EXAM: CT ABDOMEN AND PELVIS WITH CONTRAST TECHNIQUE: Multidetector CT imaging of the abdomen and pelvis was performed using the standard protocol following bolus administration of intravenous contrast. RADIATION DOSE REDUCTION: This exam was performed according to the departmental dose-optimization program which includes automated exposure control, adjustment of the mA and/or kV according to patient size and/or use of iterative reconstruction technique. CONTRAST:  OMNIPAQUE IOHEXOL 300 MG/ML  SOLN COMPARISON:  None Available. FINDINGS: Lower chest: No pleural or pericardial effusion. Coarse aortic calcifications. Visualized lung bases clear. Hepatobiliary: Mildly nodular hepatic contour. No calcified gallstones. No biliary ductal dilatation. Gallbladder nondistended. Subcentimeter coarse calcification in hepatic segment 7. Pancreas: Unremarkable. No pancreatic ductal dilatation or surrounding inflammatory changes. Spleen: Normal in size without focal abnormality. Adrenals/Urinary Tract: No adrenal mass. Bilateral urolithiasis, largest 5 mm left lower pole, on the right42mm lower pole collecting system. No hydronephrosis or ureteral calculus. Urinary bladder incompletely distended. Stomach/Bowel: Stomach physiologically distended by ingested fluid. Small bowel decompressed. Normal appendix. The colon  is partially distended, without acute finding. Vascular/Lymphatic: Scattered calcified aortic plaque  without AAA. Portal vein patent. No abdominal or pelvic adenopathy. Reproductive: 5 cm -1 HU simple appearing right adnexal cystic process. Uterus unremarkable. Other: No ascites.  No free air. Musculoskeletal: Partially calcified subcutaneous injection granulomas in the anterior abdominal wall. 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 IMPRESSION: 1. No acute findings. 2. Bilateral nephrolithiasis. 3. 5 cm right adnexal cyst. Recommend follow-up US in 6-12 months. Note: This recommendation does not apply to premenarchal patients and to those with increased risk (genetic, family history, elevated tumor markers or other high-risk factors) of ovarian cancer. Reference: JACR 2020 Feb; 17(2):248-254 4. Mildly nodular hepatic contour, suggesting cirrhosis. 5.  Aortic Atherosclerosis (ICD10-I70.0). Electronically Signed   By: Corlis Leak M.D.   On: 02/02/2023 15:17    Procedures Procedures    Medications Ordered in ED Medications  ondansetron (ZOFRAN) injection 4 mg (has no administration in time range)  lactated ringers bolus 1,000 mL (0 mLs Intravenous Stopped 02/02/23 1440)  morphine (PF) 4 MG/ML injection 4 mg (4 mg Intravenous Given 02/02/23 1343)  ondansetron (ZOFRAN) injection 4 mg (4 mg Intravenous Given 02/02/23 1341)  iohexol (OMNIPAQUE) 300 MG/ML solution 100 mL (100 mLs Intravenous Contrast Given 02/02/23 1358)  pantoprazole (PROTONIX) injection 80 mg (80 mg Intravenous Given 02/02/23 1431)    ED Course/ Medical Decision Making/ A&P                                 Medical Decision Making Problems Addressed: Black stool: acute illness or injury Melena: acute illness or injury with systemic symptoms that poses a threat to life or bodily functions On anticoagulant therapy: chronic illness or injury with exacerbation, progression, or side effects of treatment that poses a threat to life or bodily functions Right ovarian cyst: chronic illness or  injury Upper GI bleeding: acute illness or injury with systemic symptoms that poses a threat to life or bodily functions  Amount and/or Complexity of Data Reviewed Independent Historian: EMS    Details: Ems/family, hx External Data Reviewed: notes. Labs: ordered. Decision-making details documented in ED Course. Radiology: ordered and independent interpretation performed. Decision-making details documented in ED Course. Discussion of management or test interpretation with external provider(s): Gi, hospitalists.   Risk Prescription drug management. Parenteral controlled substances. Decision regarding hospitalization.   Iv ns. Continuous pulse ox and cardiac monitoring. Labs ordered/sent. Imaging ordered.   Differential diagnosis includes diverticulitis, uti/pyelo, gi bleeding, etc. Dispo decision including potential need for admission considered - will get labs and imaging and reassess.   Reviewed nursing notes and prior charts for additional history. External reports reviewed. Additional history from: EMS.   Cardiac monitor: sinus rhythm, rate 100.   LR bolus. Morphine iv. Zofran iv.   Labs reviewed/interpreted by me - hgb 12, mildly decreased from prior. Bun increased from prior. Stool is black/tarry. Protonix iv.   CT reviewed/interpreted by me - no obstruction or acute process.  Pt with episode coffee ground emesis in ED. Zofran iv. Gi consulted. Discussed pt, mild drop in hgb, high bun, melena, etc.  - he indicates recommends PPI bid, avoid any nsaids, keep npo for now, he will see and decide on possible egd tomorrow, admit to hospitalists.   Hospitalists consulted for admission.  CRITICAL CARE RE; Upper gi bleeding, symptomatic anemia, r/o bleeding ulcer Performed by: Suzi Roots Total critical care  time: 40 minutes Critical care time was exclusive of separately billable procedures and treating other patients. Critical care was necessary to treat or prevent imminent or  life-threatening deterioration. Critical care was time spent personally by me on the following activities: development of treatment plan with patient and/or surrogate as well as nursing, discussions with consultants, evaluation of patient's response to treatment, examination of patient, obtaining history from patient or surrogate, ordering and performing treatments and interventions, ordering and review of laboratory studies, ordering and review of radiographic studies, pulse oximetry and re-evaluation of patient's condition.            Final Clinical Impression(s) / ED Diagnoses Final diagnoses:  Upper GI bleeding  Black stool  Melena  Right ovarian cyst  On anticoagulant therapy    Rx / DC Orders ED Discharge Orders     None         Cathren Laine, MD 02/02/23 1549

## 2023-02-02 NOTE — ED Notes (Signed)
Patient transported to CT 

## 2023-02-03 ENCOUNTER — Observation Stay (HOSPITAL_COMMUNITY): Payer: Medicare HMO | Admitting: Anesthesiology

## 2023-02-03 ENCOUNTER — Encounter (HOSPITAL_COMMUNITY)
Admission: EM | Disposition: A | Discharge status: Home / Self Care | Payer: Self-pay | Source: Home / Self Care | Attending: Family Medicine

## 2023-02-03 ENCOUNTER — Encounter (HOSPITAL_COMMUNITY): Payer: Self-pay | Admitting: Internal Medicine

## 2023-02-03 ENCOUNTER — Other Ambulatory Visit: Payer: Self-pay

## 2023-02-03 DIAGNOSIS — K3189 Other diseases of stomach and duodenum: Secondary | ICD-10-CM | POA: Diagnosis not present

## 2023-02-03 DIAGNOSIS — E66813 Obesity, class 3: Secondary | ICD-10-CM | POA: Diagnosis present

## 2023-02-03 DIAGNOSIS — Z833 Family history of diabetes mellitus: Secondary | ICD-10-CM | POA: Diagnosis not present

## 2023-02-03 DIAGNOSIS — E785 Hyperlipidemia, unspecified: Secondary | ICD-10-CM | POA: Diagnosis present

## 2023-02-03 DIAGNOSIS — Z6841 Body Mass Index (BMI) 40.0 and over, adult: Secondary | ICD-10-CM | POA: Diagnosis not present

## 2023-02-03 DIAGNOSIS — Z881 Allergy status to other antibiotic agents status: Secondary | ICD-10-CM | POA: Diagnosis not present

## 2023-02-03 DIAGNOSIS — D62 Acute posthemorrhagic anemia: Secondary | ICD-10-CM | POA: Diagnosis present

## 2023-02-03 DIAGNOSIS — I4819 Other persistent atrial fibrillation: Secondary | ICD-10-CM | POA: Diagnosis present

## 2023-02-03 DIAGNOSIS — K274 Chronic or unspecified peptic ulcer, site unspecified, with hemorrhage: Secondary | ICD-10-CM

## 2023-02-03 DIAGNOSIS — K269 Duodenal ulcer, unspecified as acute or chronic, without hemorrhage or perforation: Secondary | ICD-10-CM | POA: Diagnosis not present

## 2023-02-03 DIAGNOSIS — K264 Chronic or unspecified duodenal ulcer with hemorrhage: Secondary | ICD-10-CM

## 2023-02-03 DIAGNOSIS — I10 Essential (primary) hypertension: Secondary | ICD-10-CM | POA: Diagnosis present

## 2023-02-03 DIAGNOSIS — R944 Abnormal results of kidney function studies: Secondary | ICD-10-CM | POA: Diagnosis present

## 2023-02-03 DIAGNOSIS — K922 Gastrointestinal hemorrhage, unspecified: Secondary | ICD-10-CM | POA: Diagnosis present

## 2023-02-03 DIAGNOSIS — K2970 Gastritis, unspecified, without bleeding: Secondary | ICD-10-CM | POA: Diagnosis not present

## 2023-02-03 DIAGNOSIS — K7689 Other specified diseases of liver: Secondary | ICD-10-CM | POA: Diagnosis not present

## 2023-02-03 DIAGNOSIS — N83201 Unspecified ovarian cyst, right side: Secondary | ICD-10-CM | POA: Diagnosis present

## 2023-02-03 DIAGNOSIS — E119 Type 2 diabetes mellitus without complications: Secondary | ICD-10-CM | POA: Diagnosis present

## 2023-02-03 DIAGNOSIS — E8881 Metabolic syndrome: Secondary | ICD-10-CM | POA: Diagnosis present

## 2023-02-03 DIAGNOSIS — D6832 Hemorrhagic disorder due to extrinsic circulating anticoagulants: Secondary | ICD-10-CM | POA: Diagnosis present

## 2023-02-03 DIAGNOSIS — Z79899 Other long term (current) drug therapy: Secondary | ICD-10-CM | POA: Diagnosis not present

## 2023-02-03 DIAGNOSIS — K921 Melena: Secondary | ICD-10-CM | POA: Diagnosis present

## 2023-02-03 DIAGNOSIS — R710 Precipitous drop in hematocrit: Secondary | ICD-10-CM | POA: Diagnosis not present

## 2023-02-03 DIAGNOSIS — Z7984 Long term (current) use of oral hypoglycemic drugs: Secondary | ICD-10-CM | POA: Diagnosis not present

## 2023-02-03 DIAGNOSIS — K7460 Unspecified cirrhosis of liver: Secondary | ICD-10-CM | POA: Diagnosis present

## 2023-02-03 DIAGNOSIS — Z791 Long term (current) use of non-steroidal anti-inflammatories (NSAID): Secondary | ICD-10-CM | POA: Diagnosis not present

## 2023-02-03 DIAGNOSIS — Z83438 Family history of other disorder of lipoprotein metabolism and other lipidemia: Secondary | ICD-10-CM | POA: Diagnosis not present

## 2023-02-03 DIAGNOSIS — T45515A Adverse effect of anticoagulants, initial encounter: Secondary | ICD-10-CM | POA: Diagnosis present

## 2023-02-03 DIAGNOSIS — N200 Calculus of kidney: Secondary | ICD-10-CM | POA: Diagnosis present

## 2023-02-03 DIAGNOSIS — Z8249 Family history of ischemic heart disease and other diseases of the circulatory system: Secondary | ICD-10-CM | POA: Diagnosis not present

## 2023-02-03 DIAGNOSIS — Z7901 Long term (current) use of anticoagulants: Secondary | ICD-10-CM | POA: Diagnosis not present

## 2023-02-03 DIAGNOSIS — K2951 Unspecified chronic gastritis with bleeding: Secondary | ICD-10-CM | POA: Diagnosis not present

## 2023-02-03 HISTORY — PX: SCLEROTHERAPY: SHX6841

## 2023-02-03 HISTORY — PX: BIOPSY: SHX5522

## 2023-02-03 HISTORY — PX: ESOPHAGOGASTRODUODENOSCOPY (EGD) WITH PROPOFOL: SHX5813

## 2023-02-03 LAB — BASIC METABOLIC PANEL
Anion gap: 9 (ref 5–15)
BUN: 70 mg/dL — ABNORMAL HIGH (ref 8–23)
CO2: 22 mmol/L (ref 22–32)
Calcium: 9.8 mg/dL (ref 8.9–10.3)
Chloride: 107 mmol/L (ref 98–111)
Creatinine, Ser: 0.66 mg/dL (ref 0.44–1.00)
GFR, Estimated: >60 mL/min (ref >60)
Glucose, Bld: 119 mg/dL — ABNORMAL HIGH (ref 70–99)
Potassium: 4.3 mmol/L (ref 3.5–5.1)
Sodium: 138 mmol/L (ref 135–145)

## 2023-02-03 LAB — HEMOGLOBIN A1C
Hgb A1c MFr Bld: 7.5 % — ABNORMAL HIGH (ref 4.8–5.6)
Mean Plasma Glucose: 169 mg/dL

## 2023-02-03 LAB — GLUCOSE, CAPILLARY
Glucose-Capillary: 123 mg/dL — ABNORMAL HIGH (ref 70–99)
Glucose-Capillary: 150 mg/dL — ABNORMAL HIGH (ref 70–99)
Glucose-Capillary: 177 mg/dL — ABNORMAL HIGH (ref 70–99)
Glucose-Capillary: 182 mg/dL — ABNORMAL HIGH (ref 70–99)
Glucose-Capillary: 205 mg/dL — ABNORMAL HIGH (ref 70–99)

## 2023-02-03 LAB — CBC
HCT: 28 % — ABNORMAL LOW (ref 36.0–46.0)
Hemoglobin: 9.2 g/dL — ABNORMAL LOW (ref 12.0–15.0)
MCH: 31.9 pg (ref 26.0–34.0)
MCHC: 32.9 g/dL (ref 30.0–36.0)
MCV: 97.2 fL (ref 80.0–100.0)
Platelets: 218 10*3/uL (ref 150–400)
RBC: 2.88 MIL/uL — ABNORMAL LOW (ref 3.87–5.11)
RDW: 13.6 % (ref 11.5–15.5)
WBC: 13 10*3/uL — ABNORMAL HIGH (ref 4.0–10.5)
nRBC: 0 % (ref 0.0–0.2)

## 2023-02-03 LAB — OCCULT BLOOD, POC DEVICE: Fecal Occult Bld: POSITIVE — AB

## 2023-02-03 LAB — HIV ANTIBODY (ROUTINE TESTING W REFLEX): HIV Screen 4th Generation wRfx: NONREACTIVE

## 2023-02-03 SURGERY — ESOPHAGOGASTRODUODENOSCOPY (EGD) WITH PROPOFOL
Anesthesia: General

## 2023-02-03 MED ORDER — SUCRALFATE 1 GM/10ML PO SUSP
1.0000 g | Freq: Three times a day (TID) | ORAL | Status: DC
  Administered 2023-02-03 – 2023-02-07 (×17): 1 g via ORAL
  Filled 2023-02-03 (×18): qty 10

## 2023-02-03 MED ORDER — TAMSULOSIN HCL 0.4 MG PO CAPS
0.4000 mg | ORAL_CAPSULE | Freq: Every day | ORAL | Status: DC
  Administered 2023-02-04 – 2023-02-06 (×3): 0.4 mg via ORAL
  Filled 2023-02-03 (×3): qty 1

## 2023-02-03 MED ORDER — PROPOFOL 500 MG/50ML IV EMUL
INTRAVENOUS | Status: DC | PRN
  Administered 2023-02-03: 150 ug/kg/min via INTRAVENOUS

## 2023-02-03 MED ORDER — PROPOFOL 10 MG/ML IV BOLUS
INTRAVENOUS | Status: DC | PRN
  Administered 2023-02-03 (×2): 50 mg via INTRAVENOUS
  Administered 2023-02-03: 20 mg via INTRAVENOUS

## 2023-02-03 MED ORDER — STERILE WATER FOR IRRIGATION IR SOLN
Status: DC | PRN
  Administered 2023-02-03: 100 mL

## 2023-02-03 MED ORDER — LIDOCAINE HCL (CARDIAC) PF 100 MG/5ML IV SOSY
PREFILLED_SYRINGE | INTRAVENOUS | Status: DC | PRN
Start: 2023-02-03 — End: 2023-02-03
  Administered 2023-02-03: 50 mg via INTRATRACHEAL

## 2023-02-03 MED ORDER — PANTOPRAZOLE SODIUM 40 MG IV SOLR
40.0000 mg | Freq: Two times a day (BID) | INTRAVENOUS | Status: DC
  Administered 2023-02-03 – 2023-02-07 (×9): 40 mg via INTRAVENOUS
  Filled 2023-02-03 (×10): qty 10

## 2023-02-03 MED ORDER — SODIUM CHLORIDE (PF) 0.9 % IJ SOLN
PREFILLED_SYRINGE | INTRAMUSCULAR | Status: DC | PRN
  Administered 2023-02-03: 1 mL

## 2023-02-03 NOTE — Consult Note (Signed)
Vista Lawman, M.D. Gastroenterology & Hepatology                                           Patient Name: Ashley [name] Account #: @FLAACCTNO @   MRN: [id number] Admission Date: 02/02/2023 Date of Evaluation:  02/03/2023 Time of Evaluation: 9:06 AM  Chief Complaint:  Melena, Vomiting   HPI:  This is a 72 y.o. female with diabetes hypertension A-fib on Eliquis, AV stenosis presented with left abdominal pain and black-colored stools.  GI consulted for melena and drop in hemoglobin concerning for upper GI bleed  Patient is seen this morning.  Is very nervous as she never had a procedure done before.  Reports she had sudden onset left lower quadrant abdominal pain reports as if a baby kicking in her stomach.  Denies any NSAID use.  Patient started seeing black tarry stools multiple episodes this was followed by nausea.  She takes Eliquis daily  Patient never had an upper endoscopy or colonoscopy or any sort of colorectal cancer screening  Elevated BUN/creatinine ratio 70/0.66  Normal liver enzymes albumin 3.6  Baseline hemoglobin 13 dropped to 9.7 platelet 269  Positive fecal occult blood  Past Medical History: SEE CHRONIC ISSSUES: Past Medical History:  Diagnosis Date   A-fib (HCC)    Depression    Diabetes mellitus without complication (HCC)    Heart disease    Hyperlipidemia    Hypertension    Hypertension    Past Surgical History:  Past Surgical History:  Procedure Laterality Date   CYST EXCISION Left 03/15/2017   from buttocks   ORIF ANKLE FRACTURE Left 04/20/2017   Procedure: OPEN REDUCTION INTERNAL FIXATION (ORIF) LEFT ANKLE FRACTURE;  Surgeon: Beverely Low, MD;  Location: [facility] OR;  Service: Orthopedics;  Laterality: Left;   Family History:  Family History  Problem Relation Age of Onset   Diabetes Mother    CAD Mother        CABG at age 64   Hypertension Mother    Hyperlipidemia Mother    Diabetes Father    Heart disease Father        CHF   Hypertension  Father    Diabetes Sister    CAD Sister    Hypertension Sister    Diabetes Brother    Hypertension Brother    Healthy Sister    Social History:  Social History   Tobacco Use   Smoking status: Never   Smokeless tobacco: Never  Vaping Use   Vaping status: Never Used  Substance Use Topics   Alcohol use: No   Drug use: No    Home Medications:  Prior to Admission medications   Medication Sig Start Date End Date Taking? Authorizing Provider  acetaminophen (TYLENOL) 500 MG tablet Take 1,000 mg by mouth 2 (two) times daily as needed for moderate pain (pain score 4-6) or headache.   Yes [provider]  albuterol (VENTOLIN HFA) 108 (90 Base) MCG/ACT inhaler INHALE 2 PUFFS INTO THE LUNGS EVERY 6 HOURS AS NEEDED FOR WHEEZE OR SHORTNESS OF BREATH 01/11/23  Yes [name], Mary-Margaret, FNP  ALPRAZolam Prudy Feeler) 0.25 MG tablet TAKE 1 TABLET DAILY AS NEEDED FOR ANXIETY 01/09/23  Yes [name], Mary-Margaret, FNP  amLODipine (NORVASC) 5 MG tablet TAKE 1 TABLET EVERY DAY 01/26/23  Yes [name], Mary-Margaret, FNP  apixaban (ELIQUIS) 5 MG TABS tablet Take 1 tablet (5 mg total)  by mouth 2 (two) times daily. 06/17/22  Yes Martin, Mary-Margaret, FNP  Ascorbic Acid (VITAMIN C PO) Take 1 tablet by mouth at bedtime.   Yes [provider]  celecoxib (CELEBREX) 200 MG capsule TAKE 1 CAPSULE TWICE DAILY 12/15/22  Yes [name], Mary-Margaret, FNP  Cholecalciferol (VITAMIN D-3 PO) Take 1 tablet by mouth at bedtime.   Yes [provider]  CINNAMON PO Take 1 capsule by mouth daily.    Yes [provider]  cloNIDine (CATAPRES) 0.2 MG tablet Take 1 tablet (0.2 mg total) by mouth 2 (two) times daily. 06/17/22  Yes Martin, Mary-Margaret, FNP  furosemide (LASIX) 20 MG tablet Take 1 tablet (20 mg total) by mouth daily. 06/17/22  Yes Martin, Mary-Margaret, FNP  Ginger, Zingiber officinalis, (GINGER ROOT PO) Take 1 capsule by mouth daily.    Yes [provider]  glipiZIDE (GLUCOTROL) 5 MG  tablet Take 1 tablet (5 mg total) by mouth 2 (two) times daily. 06/17/22  Yes Martin, Mary-Margaret, FNP  lisinopril (ZESTRIL) 40 MG tablet Take 1 tablet (40 mg total) by mouth daily. 06/17/22  Yes [name], Mary-Margaret, FNP  metFORMIN (GLUCOPHAGE) 1000 MG tablet Take 1 tablet (1,000 mg total) by mouth 2 (two) times daily with a meal. 06/17/22  Yes [name], Mary-Margaret, FNP  metoprolol tartrate (LOPRESSOR) 50 MG tablet TAKE 1 TABLET TWICE DAILY 01/26/23  Yes [name], Mary-Margaret, FNP  Multiple Vitamins-Minerals (ZINC PO) Take 1 tablet by mouth at bedtime.   Yes [provider]  Omega-3 Fatty Acids (FISH OIL PO) Take 1 capsule by mouth daily.   Yes [provider]  simvastatin (ZOCOR) 40 MG tablet TAKE 1 TABLET EVERY EVENING 01/19/23  Yes [name], Mary-Margaret, FNP  TURMERIC CURCUMIN PO Take 1 capsule by mouth daily.   Yes [provider]  Accu-Chek Softclix Lancets lancets TEST BLOOD SUGAR TWICE DAILY 04/04/22   Bennie Pierini, FNP  Alcohol Swabs (DROPSAFE ALCOHOL PREP) 70 % PADS CHECK BLOOD SUGAR TWICE DAILY 04/04/22   Bennie Pierini, FNP  Blood Glucose Monitoring Suppl (ACCU-CHEK GUIDE ME) w/Device KIT TEST BLOOD SUGAR TWICE DAILY Dx E11.9 12/01/21   Bennie Pierini, FNP  glucose blood (ACCU-CHEK AVIVA PLUS) test strip TEST BLOOD SUGAR TWICE DAILY 04/04/22   Bennie Pierini, FNP    Inpatient Medications:  Current Facility-Administered Medications:    0.9 %  sodium chloride infusion, , Intravenous, Continuous, Emokpae, Ejiroghene E, MD, Last Rate: 50 mL/hr at 02/03/23 0326, Infusion Verify at 02/03/23 0326   [MAR Hold] acetaminophen (TYLENOL) tablet 650 mg, 650 mg, Oral, Q6H PRN **OR** [MAR Hold] acetaminophen (TYLENOL) suppository 650 mg, 650 mg, Rectal, Q6H PRN, Emokpae, Ejiroghene E, MD   [MAR Hold] insulin aspart (novoLOG) injection 0-9 Units, 0-9 Units, Subcutaneous, Q6H, Emokpae, Ejiroghene E, MD   [MAR Hold] metoprolol tartrate (LOPRESSOR)  tablet 50 mg, 50 mg, Oral, BID, Emokpae, Ejiroghene E, MD, 50 mg at 02/02/23 2036   [facility] Hold] morphine (PF) 2 MG/ML injection 2 mg, 2 mg, Intravenous, Q4H PRN, Emokpae, Ejiroghene E, MD   [MAR Hold] ondansetron (ZOFRAN) tablet 4 mg, 4 mg, Oral, Q6H PRN **OR** [MAR Hold] ondansetron (ZOFRAN) injection 4 mg, 4 mg, Intravenous, Q6H PRN, Emokpae, Ejiroghene E, MD, 4 mg at 02/03/23 0019   [MAR Hold] pantoprazole (PROTONIX) injection 40 mg, 40 mg, Intravenous, Q24H, Emokpae, Ejiroghene E, MD, 40 mg at 02/03/23 0137   [MAR Hold] polyethylene glycol (MIRALAX / GLYCOLAX) packet 17 g, 17 g, Oral, Daily PRN, Emokpae, Ejiroghene E, MD Allergies: Cipro [ciprofloxacin hcl]  Complete Review of Systems: GENERAL: negative for malaise, night sweats HEENT: No changes in hearing or vision, no nose bleeds or other nasal problems. NECK: Negative for lumps, goiter, pain and significant neck swelling RESPIRATORY: Negative for cough, wheezing CARDIOVASCULAR: Negative for chest pain, leg swelling, palpitations, orthopnea GI: SEE HPI MUSCULOSKELETAL: Negative for joint pain or swelling, back pain, and muscle pain. SKIN: Negative for lesions, rash PSYCH: Negative for sleep disturbance, mood disorder and recent psychosocial stressors. HEMATOLOGY Negative for prolonged bleeding, bruising easily, and swollen nodes. ENDOCRINE: Negative for cold or heat intolerance, polyuria, polydipsia and goiter. NEURO: negative for tremor, gait imbalance, syncope and seizures. The remainder of the review of systems is noncontributory.  Physical Exam: BP (!) 132/56 (BP Location: Right Arm)   Pulse 77   Temp 98.1 F (36.7 C)   Resp 20   Ht 5' 2.5" (1.588 m)   Wt 119.7 kg   SpO2 98%   BMI 47.52 kg/m  GENERAL: The patient is AO x3, in no acute distress. HEENT: Head is normocephalic and atraumatic. EOMI are intact. Mouth is well hydrated and without lesions. NECK: Supple. No masses LUNGS: Clear to auscultation. No presence of  rhonchi/wheezing/rales. Adequate chest expansion HEART: RRR, normal s1 and s2. ABDOMEN: Soft, mild left lower quadrant tender, no guarding, no peritoneal signs, and nondistended. BS +. No masses. EXTREMITIES: Without any cyanosis, clubbing, rash, lesions or edema.  Laboratory Data CBC:     Component Value Date/Time   WBC 13.0 (H) 02/03/2023 0437   RBC 2.88 (L) 02/03/2023 0437   HGB 9.2 (L) 02/03/2023 0437   HGB 13.4 09/26/2022 1039   HCT 28.0 (L) 02/03/2023 0437   HCT 40.2 09/26/2022 1039   PLT 218 02/03/2023 0437   PLT 214 09/26/2022 1039   MCV 97.2 02/03/2023 0437   MCV 97 09/26/2022 1039   MCH 31.9 02/03/2023 0437   MCHC 32.9 02/03/2023 0437   RDW 13.6 02/03/2023 0437   RDW 12.6 09/26/2022 1039   LYMPHSABS 1.8 09/26/2022 1039   EOSABS 0.1 09/26/2022 1039   BASOSABS 0.1 09/26/2022 1039   COAG: No results found for: "INR", "PROTIME"  BMP:     Latest Ref Rng & Units 02/03/2023    4:37 AM 02/02/2023    1:16 PM 09/26/2022   10:39 AM  BMP  Glucose 70 - 99 mg/dL 606  301  601   BUN 8 - 23 mg/dL 70  56  24   Creatinine 0.44 - 1.00 mg/dL 0.93  2.35  5.73   BUN/Creat Ratio 12 - 28   39   Sodium 135 - 145 mmol/L 138  139  141   Potassium 3.5 - 5.1 mmol/L 4.3  4.8  4.9   Chloride 98 - 111 mmol/L 107  105  103   CO2 22 - 32 mmol/L 22  21  22    Calcium 8.9 - 10.3 mg/dL 9.8  9.8  9.8     HEPATIC:     Latest Ref Rng & Units 02/02/2023    1:16 PM 09/26/2022   10:39 AM 06/17/2022   10:35 AM  Hepatic Function  Total Protein 6.5 - 8.1 g/dL 7.3  7.2  6.3   Albumin 3.5 - 5.0 g/dL 3.6  4.2  3.7   AST 15 - 41 U/L 28  36  24   ALT 0 - 44 U/L 20  24  19    Alk Phosphatase 38 - 126 U/L 40  55  51   Total Bilirubin <1.2  mg/dL 1.1  0.8  0.6     CARDIAC: No results found for: "CKTOTAL", "CKMB", "CKMBINDEX", "TROPONINI"   Imaging: I personally reviewed and interpreted the available imaging.  IMPRESSION:  1. No acute findings.  2. Bilateral nephrolithiasis.  3. 5 cm right adnexal  cyst. Recommend follow-up US in 6-12 months.  Note: This recommendation does not apply to premenarchal patients  and to those with increased risk (genetic, family history, elevated  tumor markers or other high-risk factors) of ovarian cancer.  Reference: JACR 2020 Feb; 17(2):248-254  4. Mildly nodular hepatic contour, suggesting cirrhosis.  5.  Aortic Atherosclerosis (ICD10-I70.0).   Assessment & Plan:   This is a 72 y.o. female with diabetes hypertension A-fib on Eliquis, AV stenosis presented with left abdominal pain and black-colored stools.  GI consulted for melena and drop in hemoglobin concerning for upper GI bleed  #Melena  Patient never had an upper endoscopy or colonoscopy or any sort of colorectal cancer screening Elevated BUN/creatinine ratio 70/0.66 Normal liver enzymes albumin 3.6 Baseline hemoglobin 13 dropped to 9.7 platelet 269 Positive fecal occult blood  This could be esophagitis, gastritis, peptic ulcer disease rule out malignancy and given concerns of cirrhosis could be esophageal varices although less likely given platelets more than 150  Discussed with patient risk indication limitation for upper endoscopy..  I spoke with daughter Ashley [name] over the phone as well Plan for upper endoscopy with intervention this morning Continue Protonix  Also discussed with patient if upper endoscopy is unremarkable indication for colonoscopy as patient did had drop in hemoglobin and needs to be continued on anticoagulation.  She is agreeable for colonoscopy and patient if upper endoscopy is unremarkable  #?Compensated Cirrhosis Likely etiology MASLD , given BMI 47 and metabolic syndrome   CT demonstrating nodular liver.  Platelets more than 150  Will give antibiotics and treat as cirrhotic for now given GI bleed and indication for antibiotics in a cirrhotic patient with GI bleed  Obtain INR  Viral hep B&C, ANA, ferritin, HIV, AFP   Vista Lawman,  MD Gastroenterology and Hepatology [facility]  This chart has been completed using [facility] Dictation software, and while attempts have been made to ensure accuracy , certain words and phrases may not be transcribed as intended

## 2023-02-03 NOTE — Interval H&P Note (Signed)
History and Physical Interval Note:  02/03/2023 9:23 AM  Ashley [name]  has presented today for surgery, with the diagnosis of Upper Gi bleed.  The various methods of treatment have been discussed with the patient and family. After consideration of risks, benefits and other options for treatment, the patient has consented to  Procedure(s): ESOPHAGOGASTRODUODENOSCOPY (EGD) WITH PROPOFOL (N/A) as a surgical intervention.  The patient's history has been reviewed, patient examined, no change in status, stable for surgery.  I have reviewed the patient's chart and labs.  Questions were answered to the patient's satisfaction.    We will proceed with EGD as scheduled.    I thoroughly discussed with the patient the procedure, including the risks involved. Patient understands what the procedure involves including the benefits and any risks. Patient understands alternatives to the proposed procedure. Risks including (but not limited to) bleeding, tearing of the lining (perforation), rupture of adjacent organs, problems with heart and lung function, infection, and medication reactions. A small percentage of complications may require surgery, hospitalization, repeat endoscopic procedure, and/or transfusion.  Patient understood and agreed.    Ashley [name]

## 2023-02-03 NOTE — Plan of Care (Signed)

## 2023-02-03 NOTE — Care Management Obs Status (Signed)
MEDICARE OBSERVATION STATUS NOTIFICATION   Patient Details  Name: Ashley [name] MRN: [id number] Date of Birth: Nov 13, 1950   Medicare Observation Status Notification Given:  Yes    Leitha Bleak, RN 02/03/2023, 1:37 PM

## 2023-02-03 NOTE — Anesthesia Postprocedure Evaluation (Signed)
Anesthesia Post Note  Patient: Shaneria Spurbeck  Procedure(s) Performed: ESOPHAGOGASTRODUODENOSCOPY (EGD) WITH PROPOFOL BIOPSY SCLEROTHERAPY  Patient location during evaluation: PACU Anesthesia Type: General Level of consciousness: awake and alert Pain management: pain level controlled Vital Signs Assessment: post-procedure vital signs reviewed and stable Respiratory status: spontaneous breathing Cardiovascular status: blood pressure returned to baseline and stable Postop Assessment: no apparent nausea or vomiting Anesthetic complications: no   No notable events documented.   Last Vitals:  Vitals:   02/03/23 0514 02/03/23 0903  BP: (!) 132/56 (!) (P) 140/64  Pulse: 77 (P) 73  Resp: 20 (P) 13  Temp: 36.7 C (P) 36.7 C  SpO2: 98% (P) 100%    Last Pain:  Vitals:   02/03/23 0919  TempSrc:   PainSc: 6                  Liane Tribbey

## 2023-02-03 NOTE — Anesthesia Preprocedure Evaluation (Addendum)
Anesthesia Evaluation  Patient identified by MRN, date of birth, ID band Patient awake    Reviewed: Allergy & Precautions, H&P , NPO status , Patient's Chart, lab work & pertinent test results, reviewed documented beta blocker date and time   Airway Mallampati: II  TM Distance: >3 FB Neck ROM: full    Dental no notable dental hx.    Pulmonary neg pulmonary ROS   Pulmonary exam normal breath sounds clear to auscultation       Cardiovascular Exercise Tolerance: Good hypertension, Normal cardiovascular exam+ dysrhythmias Atrial Fibrillation  Rhythm:regular Rate:Normal     Neuro/Psych  PSYCHIATRIC DISORDERS  Depression    negative neurological ROS     GI/Hepatic Neg liver ROS,,,GI bleeding   Endo/Other  diabetes, Type 2    Renal/GU negative Renal ROS  negative genitourinary   Musculoskeletal   Abdominal Normal abdominal exam  (+)   Peds  Hematology  (+) Blood dyscrasia, anemia Hgb 9.2   Anesthesia Other Findings   Reproductive/Obstetrics negative OB ROS                             Anesthesia Physical Anesthesia Plan  ASA: 3  Anesthesia Plan: General   Post-op Pain Management: Minimal or no pain anticipated   Induction: Intravenous  PONV Risk Score and Plan: Propofol infusion  Airway Management Planned: Nasal Cannula and Natural Airway  Additional Equipment: None  Intra-op Plan:   Post-operative Plan:   Informed Consent: I have reviewed the patients History and Physical, chart, labs and discussed the procedure including the risks, benefits and alternatives for the proposed anesthesia with the patient or authorized representative who has indicated his/her understanding and acceptance.     Dental Advisory Given  Plan Discussed with: CRNA  Anesthesia Plan Comments:        Anesthesia Quick Evaluation

## 2023-02-03 NOTE — Anesthesia Postprocedure Evaluation (Signed)
Anesthesia Post Note  Patient: Ashley [name]  Procedure(s) Performed: ESOPHAGOGASTRODUODENOSCOPY (EGD) WITH PROPOFOL BIOPSY SCLEROTHERAPY  Anesthesia Type: General Anesthetic complications: no   There were no known notable events for this encounter.   Last Vitals:  Vitals:   02/03/23 1300 02/03/23 1529  BP: (!) 134/57 (!) 125/52  Pulse: 89 68  Resp:  20  Temp:  36.9 C  SpO2:      Last Pain:  Vitals:   02/03/23 1529  TempSrc: Oral  PainSc:                  Gaetano Hawthorne

## 2023-02-03 NOTE — Plan of Care (Signed)

## 2023-02-03 NOTE — Op Note (Addendum)
[facility] Patient Name: Ashley [name] Procedure Date: 02/03/2023 9:06 AM MRN: [id number] Date of Birth: [birth year] Attending MD: Sanjuan Dame , MD, [id number] CSN: [id number] Age: 72 Admit Type: Outpatient Procedure:                Upper GI endoscopy Indications:              Acute post hemorrhagic anemia, Melena Providers:                Sanjuan Dame, MD, Buel Ream. Thomasena Edis RN, RN,                            Elinor Parkinson Referring MD:              Medicines:                Monitored Anesthesia Care Complications:            No immediate complications. Estimated Blood Loss:     Estimated blood loss was minimal. Procedure:                Pre-Anesthesia Assessment:                           - Prior to the procedure, a History and Physical                            was performed, and patient medications and                            allergies were reviewed. The patient's tolerance of                            previous anesthesia was also reviewed. The risks                            and benefits of the procedure and the sedation                            options and risks were discussed with the patient.                            All questions were answered, and informed consent                            was obtained. Prior Anticoagulants: The patient has                            taken Eliquis (apixaban), last dose was 1 day prior                            to procedure. ASA Grade Assessment: III - A patient                            with severe systemic disease. After reviewing the  risks and benefits, the patient was deemed in                            satisfactory condition to undergo the procedure.                           After obtaining informed consent, the endoscope was                            passed under direct vision. Throughout the                            procedure, the patient's blood pressure, pulse, and                             oxygen saturations were monitored continuously. The                            [id number]-[id number] ([id number]) scope was introduced through the                            mouth, and advanced to the second part of duodenum.                            The upper GI endoscopy was accomplished without                            difficulty. The patient tolerated the procedure                            well. Scope In: 9:25:19 AM Scope Out: 9:33:53 AM Total Procedure Duration: 0 hours 8 minutes 34 seconds  Findings:      No gross lesions were noted in the entire esophagus.      There is no endoscopic evidence of esophagitis or varices in the entire       esophagus.      Moderately erythematous mucosa without bleeding was found in the gastric       antrum. Biopsies were taken with a cold forceps for histology.      One non-bleeding cratered duodenal ulcer with a flat pigmented spot       (Forrest Class IIc) was found in the duodenal bulb. The lesion was 15 mm       in largest dimension. Area was successfully injected with 1 mL of a 0.1       mg/mL solution of epinephrine for hemostasis. Impression:               - No gross lesions in the entire esophagus.                           - Erythematous mucosa in the antrum. Biopsied.                           - Non-bleeding duodenal ulcer with a flat pigmented  spot (Forrest Class IIc). Injected. Moderate Sedation:      Per Anesthesia Care Recommendation:           Start Clear liquid today and advance to regular                            tomorrow if no bleeding                           Protonix 40mg  BID for 2 weeks and daily therefater                            for 6 weeks                           Avoid NSAIDs                           Can restart Eliquis in 48 hours if no further                            bleeding is encountered                           Repeat CBC in the morning                           Will  consider repeat EGD in 8-12 weeks to assess                            healing of ulcer given large size                           Antibiotics for total of 5 days as concerns for                            cirrhotic and GI bleeding ( patient is currently                            also being treated for UTI)                           Outpatient GI clinic follow up for management of                            cirrhosis ( new diagnosis) and Colon cancer                            screening ( patient never had one)                           Management of Adnexal cyst as per PCP and primary                            team Procedure Code(s):        --- Professional ---  43255, 59, Esophagogastroduodenoscopy, flexible,                            transoral; with control of bleeding, any method                           43239, Esophagogastroduodenoscopy, flexible,                            transoral; with biopsy, single or multiple Diagnosis Code(s):        --- Professional ---                           K31.89, Other diseases of stomach and duodenum                           K26.9, Duodenal ulcer, unspecified as acute or                            chronic, without hemorrhage or perforation                           D62, Acute posthemorrhagic anemia                           K92.1, Melena (includes Hematochezia) CPT copyright 2022 American Medical Association. All rights reserved. The codes documented in this report are preliminary and upon coder review may  be revised to meet current compliance requirements. Sanjuan Dame, MD Sanjuan Dame, MD 02/03/2023 9:40:58 AM This report has been signed electronically. Number of Addenda: 0

## 2023-02-03 NOTE — Progress Notes (Signed)
No bloody BM since admission.

## 2023-02-03 NOTE — H&P (View-Only) (Signed)
Vista Lawman, M.D. Gastroenterology & Hepatology                                           Patient Name: Ashley [name] Account #: @FLAACCTNO @   MRN: [id number] Admission Date: 02/02/2023 Date of Evaluation:  02/03/2023 Time of Evaluation: 9:06 AM  Chief Complaint:  Melena, Vomiting   HPI:  This is a 72 y.o. female with diabetes hypertension A-fib on Eliquis, AV stenosis presented with left abdominal pain and black-colored stools.  GI consulted for melena and drop in hemoglobin concerning for upper GI bleed  Patient is seen this morning.  Is very nervous as she never had a procedure done before.  Reports she had sudden onset left lower quadrant abdominal pain reports as if a baby kicking in her stomach.  Denies any NSAID use.  Patient started seeing black tarry stools multiple episodes this was followed by nausea.  She takes Eliquis daily  Patient never had an upper endoscopy or colonoscopy or any sort of colorectal cancer screening  Elevated BUN/creatinine ratio 70/0.66  Normal liver enzymes albumin 3.6  Baseline hemoglobin 13 dropped to 9.7 platelet 269  Positive fecal occult blood  Past Medical History: SEE CHRONIC ISSSUES: Past Medical History:  Diagnosis Date   A-fib (HCC)    Depression    Diabetes mellitus without complication (HCC)    Heart disease    Hyperlipidemia    Hypertension    Hypertension    Past Surgical History:  Past Surgical History:  Procedure Laterality Date   CYST EXCISION Left 03/15/2017   from buttocks   ORIF ANKLE FRACTURE Left 04/20/2017   Procedure: OPEN REDUCTION INTERNAL FIXATION (ORIF) LEFT ANKLE FRACTURE;  Surgeon: Beverely Low, MD;  Location: [facility] OR;  Service: Orthopedics;  Laterality: Left;   Family History:  Family History  Problem Relation Age of Onset   Diabetes Mother    CAD Mother        CABG at age 64   Hypertension Mother    Hyperlipidemia Mother    Diabetes Father    Heart disease Father        CHF   Hypertension  Father    Diabetes Sister    CAD Sister    Hypertension Sister    Diabetes Brother    Hypertension Brother    Healthy Sister    Social History:  Social History   Tobacco Use   Smoking status: Never   Smokeless tobacco: Never  Vaping Use   Vaping status: Never Used  Substance Use Topics   Alcohol use: No   Drug use: No    Home Medications:  Prior to Admission medications   Medication Sig Start Date End Date Taking? Authorizing Provider  acetaminophen (TYLENOL) 500 MG tablet Take 1,000 mg by mouth 2 (two) times daily as needed for moderate pain (pain score 4-6) or headache.   Yes [provider]  albuterol (VENTOLIN HFA) 108 (90 Base) MCG/ACT inhaler INHALE 2 PUFFS INTO THE LUNGS EVERY 6 HOURS AS NEEDED FOR WHEEZE OR SHORTNESS OF BREATH 01/11/23  Yes [name], Mary-Margaret, FNP  ALPRAZolam Prudy Feeler) 0.25 MG tablet TAKE 1 TABLET DAILY AS NEEDED FOR ANXIETY 01/09/23  Yes [name], Mary-Margaret, FNP  amLODipine (NORVASC) 5 MG tablet TAKE 1 TABLET EVERY DAY 01/26/23  Yes [name], Mary-Margaret, FNP  apixaban (ELIQUIS) 5 MG TABS tablet Take 1 tablet (5 mg total)  by mouth 2 (two) times daily. 06/17/22  Yes Martin, Mary-Margaret, FNP  Ascorbic Acid (VITAMIN C PO) Take 1 tablet by mouth at bedtime.   Yes [provider]  celecoxib (CELEBREX) 200 MG capsule TAKE 1 CAPSULE TWICE DAILY 12/15/22  Yes [name], Mary-Margaret, FNP  Cholecalciferol (VITAMIN D-3 PO) Take 1 tablet by mouth at bedtime.   Yes [provider]  CINNAMON PO Take 1 capsule by mouth daily.    Yes [provider]  cloNIDine (CATAPRES) 0.2 MG tablet Take 1 tablet (0.2 mg total) by mouth 2 (two) times daily. 06/17/22  Yes Martin, Mary-Margaret, FNP  furosemide (LASIX) 20 MG tablet Take 1 tablet (20 mg total) by mouth daily. 06/17/22  Yes Martin, Mary-Margaret, FNP  Ginger, Zingiber officinalis, (GINGER ROOT PO) Take 1 capsule by mouth daily.    Yes [provider]  glipiZIDE (GLUCOTROL) 5 MG  tablet Take 1 tablet (5 mg total) by mouth 2 (two) times daily. 06/17/22  Yes Martin, Mary-Margaret, FNP  lisinopril (ZESTRIL) 40 MG tablet Take 1 tablet (40 mg total) by mouth daily. 06/17/22  Yes [name], Mary-Margaret, FNP  metFORMIN (GLUCOPHAGE) 1000 MG tablet Take 1 tablet (1,000 mg total) by mouth 2 (two) times daily with a meal. 06/17/22  Yes [name], Mary-Margaret, FNP  metoprolol tartrate (LOPRESSOR) 50 MG tablet TAKE 1 TABLET TWICE DAILY 01/26/23  Yes [name], Mary-Margaret, FNP  Multiple Vitamins-Minerals (ZINC PO) Take 1 tablet by mouth at bedtime.   Yes [provider]  Omega-3 Fatty Acids (FISH OIL PO) Take 1 capsule by mouth daily.   Yes [provider]  simvastatin (ZOCOR) 40 MG tablet TAKE 1 TABLET EVERY EVENING 01/19/23  Yes [name], Mary-Margaret, FNP  TURMERIC CURCUMIN PO Take 1 capsule by mouth daily.   Yes [provider]  Accu-Chek Softclix Lancets lancets TEST BLOOD SUGAR TWICE DAILY 04/04/22   Bennie Pierini, FNP  Alcohol Swabs (DROPSAFE ALCOHOL PREP) 70 % PADS CHECK BLOOD SUGAR TWICE DAILY 04/04/22   Bennie Pierini, FNP  Blood Glucose Monitoring Suppl (ACCU-CHEK GUIDE ME) w/Device KIT TEST BLOOD SUGAR TWICE DAILY Dx E11.9 12/01/21   Bennie Pierini, FNP  glucose blood (ACCU-CHEK AVIVA PLUS) test strip TEST BLOOD SUGAR TWICE DAILY 04/04/22   Bennie Pierini, FNP    Inpatient Medications:  Current Facility-Administered Medications:    0.9 %  sodium chloride infusion, , Intravenous, Continuous, Emokpae, Ejiroghene E, MD, Last Rate: 50 mL/hr at 02/03/23 0326, Infusion Verify at 02/03/23 0326   [MAR Hold] acetaminophen (TYLENOL) tablet 650 mg, 650 mg, Oral, Q6H PRN **OR** [MAR Hold] acetaminophen (TYLENOL) suppository 650 mg, 650 mg, Rectal, Q6H PRN, Emokpae, Ejiroghene E, MD   [MAR Hold] insulin aspart (novoLOG) injection 0-9 Units, 0-9 Units, Subcutaneous, Q6H, Emokpae, Ejiroghene E, MD   [MAR Hold] metoprolol tartrate (LOPRESSOR)  tablet 50 mg, 50 mg, Oral, BID, Emokpae, Ejiroghene E, MD, 50 mg at 02/02/23 2036   [facility] Hold] morphine (PF) 2 MG/ML injection 2 mg, 2 mg, Intravenous, Q4H PRN, Emokpae, Ejiroghene E, MD   [MAR Hold] ondansetron (ZOFRAN) tablet 4 mg, 4 mg, Oral, Q6H PRN **OR** [MAR Hold] ondansetron (ZOFRAN) injection 4 mg, 4 mg, Intravenous, Q6H PRN, Emokpae, Ejiroghene E, MD, 4 mg at 02/03/23 0019   [MAR Hold] pantoprazole (PROTONIX) injection 40 mg, 40 mg, Intravenous, Q24H, Emokpae, Ejiroghene E, MD, 40 mg at 02/03/23 0137   [MAR Hold] polyethylene glycol (MIRALAX / GLYCOLAX) packet 17 g, 17 g, Oral, Daily PRN, Emokpae, Ejiroghene E, MD Allergies: Cipro [ciprofloxacin hcl]  Complete Review of Systems: GENERAL: negative for malaise, night sweats HEENT: No changes in hearing or vision, no nose bleeds or other nasal problems. NECK: Negative for lumps, goiter, pain and significant neck swelling RESPIRATORY: Negative for cough, wheezing CARDIOVASCULAR: Negative for chest pain, leg swelling, palpitations, orthopnea GI: SEE HPI MUSCULOSKELETAL: Negative for joint pain or swelling, back pain, and muscle pain. SKIN: Negative for lesions, rash PSYCH: Negative for sleep disturbance, mood disorder and recent psychosocial stressors. HEMATOLOGY Negative for prolonged bleeding, bruising easily, and swollen nodes. ENDOCRINE: Negative for cold or heat intolerance, polyuria, polydipsia and goiter. NEURO: negative for tremor, gait imbalance, syncope and seizures. The remainder of the review of systems is noncontributory.  Physical Exam: BP (!) 132/56 (BP Location: Right Arm)   Pulse 77   Temp 98.1 F (36.7 C)   Resp 20   Ht 5' 2.5" (1.588 m)   Wt 119.7 kg   SpO2 98%   BMI 47.52 kg/m  GENERAL: The patient is AO x3, in no acute distress. HEENT: Head is normocephalic and atraumatic. EOMI are intact. Mouth is well hydrated and without lesions. NECK: Supple. No masses LUNGS: Clear to auscultation. No presence of  rhonchi/wheezing/rales. Adequate chest expansion HEART: RRR, normal s1 and s2. ABDOMEN: Soft, mild left lower quadrant tender, no guarding, no peritoneal signs, and nondistended. BS +. No masses. EXTREMITIES: Without any cyanosis, clubbing, rash, lesions or edema.  Laboratory Data CBC:     Component Value Date/Time   WBC 13.0 (H) 02/03/2023 0437   RBC 2.88 (L) 02/03/2023 0437   HGB 9.2 (L) 02/03/2023 0437   HGB 13.4 09/26/2022 1039   HCT 28.0 (L) 02/03/2023 0437   HCT 40.2 09/26/2022 1039   PLT 218 02/03/2023 0437   PLT 214 09/26/2022 1039   MCV 97.2 02/03/2023 0437   MCV 97 09/26/2022 1039   MCH 31.9 02/03/2023 0437   MCHC 32.9 02/03/2023 0437   RDW 13.6 02/03/2023 0437   RDW 12.6 09/26/2022 1039   LYMPHSABS 1.8 09/26/2022 1039   EOSABS 0.1 09/26/2022 1039   BASOSABS 0.1 09/26/2022 1039   COAG: No results found for: "INR", "PROTIME"  BMP:     Latest Ref Rng & Units 02/03/2023    4:37 AM 02/02/2023    1:16 PM 09/26/2022   10:39 AM  BMP  Glucose 70 - 99 mg/dL 606  301  601   BUN 8 - 23 mg/dL 70  56  24   Creatinine 0.44 - 1.00 mg/dL 0.93  2.35  5.73   BUN/Creat Ratio 12 - 28   39   Sodium 135 - 145 mmol/L 138  139  141   Potassium 3.5 - 5.1 mmol/L 4.3  4.8  4.9   Chloride 98 - 111 mmol/L 107  105  103   CO2 22 - 32 mmol/L 22  21  22    Calcium 8.9 - 10.3 mg/dL 9.8  9.8  9.8     HEPATIC:     Latest Ref Rng & Units 02/02/2023    1:16 PM 09/26/2022   10:39 AM 06/17/2022   10:35 AM  Hepatic Function  Total Protein 6.5 - 8.1 g/dL 7.3  7.2  6.3   Albumin 3.5 - 5.0 g/dL 3.6  4.2  3.7   AST 15 - 41 U/L 28  36  24   ALT 0 - 44 U/L 20  24  19    Alk Phosphatase 38 - 126 U/L 40  55  51   Total Bilirubin <1.2  mg/dL 1.1  0.8  0.6     CARDIAC: No results found for: "CKTOTAL", "CKMB", "CKMBINDEX", "TROPONINI"   Imaging: I personally reviewed and interpreted the available imaging.  IMPRESSION:  1. No acute findings.  2. Bilateral nephrolithiasis.  3. 5 cm right adnexal  cyst. Recommend follow-up US in 6-12 months.  Note: This recommendation does not apply to premenarchal patients  and to those with increased risk (genetic, family history, elevated  tumor markers or other high-risk factors) of ovarian cancer.  Reference: JACR 2020 Feb; 17(2):248-254  4. Mildly nodular hepatic contour, suggesting cirrhosis.  5.  Aortic Atherosclerosis (ICD10-I70.0).   Assessment & Plan:   This is a 72 y.o. female with diabetes hypertension A-fib on Eliquis, AV stenosis presented with left abdominal pain and black-colored stools.  GI consulted for melena and drop in hemoglobin concerning for upper GI bleed  #Melena  Patient never had an upper endoscopy or colonoscopy or any sort of colorectal cancer screening Elevated BUN/creatinine ratio 70/0.66 Normal liver enzymes albumin 3.6 Baseline hemoglobin 13 dropped to 9.7 platelet 269 Positive fecal occult blood  This could be esophagitis, gastritis, peptic ulcer disease rule out malignancy and given concerns of cirrhosis could be esophageal varices although less likely given platelets more than 150  Discussed with patient risk indication limitation for upper endoscopy..  I spoke with daughter Herbert Seta over the phone as well Plan for upper endoscopy with intervention this morning Continue Protonix  Also discussed with patient if upper endoscopy is unremarkable indication for colonoscopy as patient did had drop in hemoglobin and needs to be continued on anticoagulation.  She is agreeable for colonoscopy and patient if upper endoscopy is unremarkable  #?Compensated Cirrhosis Likely etiology MASLD , given BMI 47 and metabolic syndrome   CT demonstrating nodular liver.  Platelets more than 150  Will give antibiotics and treat as cirrhotic for now given GI bleed and indication for antibiotics in a cirrhotic patient with GI bleed  Obtain INR  Viral hep B&C, ANA, ferritin, HIV, AFP   Vista Lawman,  MD Gastroenterology and Hepatology [facility] Gastroenterology  This chart has been completed using [facility] Dictation software, and while attempts have been made to ensure accuracy , certain words and phrases may not be transcribed as intended

## 2023-02-03 NOTE — Progress Notes (Signed)
Mobility Specialist Progress Note:    02/03/23 1300  Mobility  Activity Ambulated with assistance to bathroom  Level of Assistance Standby assist, set-up cues, supervision of patient - no hands on  Assistive Device Front wheel walker  Distance Ambulated (ft) 12 ft  Range of Motion/Exercises Active;All extremities  Activity Response Tolerated well  Mobility Referral Yes  Mobility visit 1 Mobility  Mobility Specialist Start Time (ACUTE ONLY) 1300  Mobility Specialist Stop Time (ACUTE ONLY) 1325  Mobility Specialist Time Calculation (min) (ACUTE ONLY) 25 min   Pt received in chair, family in room. Agreeable to mobility, required SBA to stand and ambulate with RW. Tolerated well, deferred further mobility d/t pt c/o dizziness and lightheadedness. Returned pt to chair, Development worker, community. All needs met.    Lawerance Bach Mobility Specialist Please contact via Special educational needs teacher or  Rehab office at [phone]

## 2023-02-03 NOTE — Transfer of Care (Signed)
Immediate Anesthesia Transfer of Care Note  Patient: Ashley [name]  Procedure(s) Performed: ESOPHAGOGASTRODUODENOSCOPY (EGD) WITH PROPOFOL BIOPSY SCLEROTHERAPY  Patient Location: PACU  Anesthesia Type:General  Level of Consciousness: awake  Airway & Oxygen Therapy: Patient Spontanous Breathing  Post-op Assessment: Report given to RN  Post vital signs: Reviewed and stable  Last Vitals:  Vitals Value Taken Time  BP    Temp    Pulse    Resp    SpO2      Last Pain:  Vitals:   02/03/23 0919  TempSrc:   PainSc: 6       Patients Stated Pain Goal: 5 (02/03/23 0900)  Complications: No notable events documented.

## 2023-02-03 NOTE — Progress Notes (Signed)
Patient arrived to room. Alert and oriented. VSS. Patient ambulated to bathroom with assistance and front wheeled walker. Resting in bed with alarm on and call bell within reach.

## 2023-02-03 NOTE — Progress Notes (Addendum)
Patient underwent EGD under propofol sedation.  Tolerated the procedure adequately.   FINDINGS:  - No gross lesions at the esophagus - Erythematous mucosa in the antrum. Biopsied. - Non- bleeding duodenal ulcer with a flat pigmented spot ( Forrest Class IIc) . Injected.  RECOMMENDATIONS  -Start Clear liquid today and advance to regular tomorrow if no bleeding  -Protonix 40mg  BID for 2 weeks and daily therefater for 6 weeks  -Avoid NSAIDs  -Can restart Eliquis in 48 hours if no further bleeding is encountered   -Repeat CBC in the morning   -Will consider repeat EGD in 8-12 weeks to assess healing of ulcer given large size  -Antibiotics for total of 5 days as concerns for cirrhotic and GI bleeding ( patient is currently also being treated for UTI)  -Outpatient GI clinic follow up for management of cirrhosis ( new diagnosis) and Colon cancer screening ( patient never had one)  -Management of Adnexal cyst as per PCP and primary team    -Spoke with patient  daughter Herbert Seta ) over phone  - Will set up clinic appointment ( GI)   Vista Lawman, MD Gastroenterology and Hepatology [facility] Gastroenterology

## 2023-02-03 NOTE — Progress Notes (Signed)
Pt refused to remove six rings, two watches pre-procedure

## 2023-02-03 NOTE — Progress Notes (Signed)
PROGRESS NOTE  Ashley [name], is a 72 y.o. female, DOB - 08-01-1950, UJW:[id number]  Admit date - 02/02/2023   Admitting Physician Magdala Snooks Mariea Clonts, MD  Outpatient Primary MD for the patient is Bennie Pierini, FNP  LOS - 0  Chief Complaint  Patient presents with   Abdominal Pain       Brief Narrative:  72 y.o. female with medical history significant for diabetes mellitus, hypertension, atrial fibrillation on chronic anticoagulation, AV stenosis admitted on 02/02/2023 with acute GI bleed (presented with melena and hematemesis) in the setting of anticoagulation with Eliquis and Celebrex use    -Assessment and Plan: 1)Acute GI bleed GD on 02/03/2023 showed- No gross lesions at the esophagus - Erythematous mucosa in the antrum. Biopsied. - Non- bleeding duodenal ulcer with a flat pigmented spot ( Forrest Class IIc) . Injected. -PPI twice daily advised -Carafate as ordered -Hgb currently around 9 down from baseline between 12 and 13 -Clear liquid diet for now -Avoidance of NSAIDs -Continue to hold Eliquis for at least 48 hours -Monitor H&H  2)Nephrolithiasis-- CT abdomen and pelvis with contrast without acute findings shows bilateral nephrolithiasis.   -Start Flomax to aid passage of kidney stones 5 cm right adnexal cyst follow-up recommended--outpatient imaging advised  3)Persistent atrial fibrillation (HCC) -Hold Eliquis for at least 48 hours as above due to GI bleed -Continue metoprolol for rate control  4)Adnexal cyst Incidental finding on CT abdomen and pelvis with contrast/ 02/02/23-  5 cm right adnexal cyst. Recommend follow-up US in 6-12 months. Note: This recommendation does not apply to premenarchal patients and to those with increased risk (genetic, family history, elevated tumor markers or other high-risk factors) of ovarian cancer. -Follow-up as outpatient for repeat imaging  5)DM2 - A1c 7.0--sliding for diabetic control PTA Use Novolog/Humalog Sliding scale  insulin with Accu-Cheks/Fingersticks as ordered  -Hold metformin, glipizide  6)HTN (hypertension) Stable. -Hold clonidine 0.2, lisinopril 40 mg, and Lasix 20 mg in the setting of GI bleed -c/n  metoprolol 50 twice daily for atrial fibrillation  Status is: Inpatient   Disposition: The patient is from: Home              Anticipated d/c is to: Home              Anticipated d/c date is: 2 days              Patient currently is not medically stable to d/c. Barriers: Not Clinically Stable-   Code Status :  -  Code Status: Full Code   Family Communication:    NA (patient is alert, awake and coherent)   DVT Prophylaxis  :   - SCDs   SCDs Start: 02/02/23 2016   Lab Results  Component Value Date   PLT 218 02/03/2023   Inpatient Medications  Scheduled Meds:  insulin aspart  0-9 Units Subcutaneous Q6H   metoprolol tartrate  50 mg Oral BID   pantoprazole (PROTONIX) IV  40 mg Intravenous Q12H   sucralfate  1 g Oral TID WC & HS   Continuous Infusions: PRN Meds:.acetaminophen **OR** acetaminophen, morphine injection, ondansetron **OR** ondansetron (ZOFRAN) IV, polyethylene glycol   Anti-infectives (From admission, onward)    None        Subjective: Ashley [name] today has no fevers, no emesis,  No chest pain,   - Tolerated EGD well -Eager to have liquid diet for now   Objective: Vitals:   02/03/23 0945 02/03/23 1059 02/03/23 1300 02/03/23 1529  BP: (!) 92/49 Marland Kitchen)  141/49 (!) 134/57 (!) 125/52  Pulse: 80 76 89 68  Resp: 19 20  20   Temp:  98.5 F (36.9 C)  98.4 F (36.9 C)  TempSrc:  Oral  Oral  SpO2: 99% 97%    Weight:      Height:        Intake/Output Summary (Last 24 hours) at 02/03/2023 1911 Last data filed at 02/03/2023 0933 Gross per 24 hour  Intake 439.96 ml  Output 0 ml  Net 439.96 ml   Filed Weights   02/02/23 2013 02/03/23 0900  Weight: 113 kg 119.7 kg    Physical Exam  Gen:- Awake Alert,  in no apparent distress  HEENT:- .AT, No sclera  icterus Neck-Supple Neck,No JVD,.  Lungs-  CTAB , fair symmetrical air movement CV- S1, S2 normal, regular , 2/6 SM Abd-  +ve B.Sounds, Abd Soft, mild epigastric tenderness, without rebound or guarding Extremity/Skin:- No  edema, pedal pulses present  Psych-affect is appropriate, oriented x3 Neuro-no new focal deficits, no tremors  Data Reviewed: I have personally reviewed following labs and imaging studies  CBC: Recent Labs  Lab 02/02/23 1316 02/02/23 1947 02/03/23 0437  WBC 12.1*  --  13.0*  HGB 12.1 9.7* 9.2*  HCT 36.8 29.5* 28.0*  MCV 97.9  --  97.2  PLT 269  --  218   Basic Metabolic Panel: Recent Labs  Lab 02/02/23 1316 02/03/23 0437  NA 139 138  K 4.8 4.3  CL 105 107  CO2 21* 22  GLUCOSE 192* 119*  BUN 56* 70*  CREATININE 0.60 0.66  CALCIUM 9.8 9.8   GFR: Estimated Creatinine Clearance: 81.3 mL/min (by C-G formula based on SCr of 0.66 mg/dL). Liver Function Tests: Recent Labs  Lab 02/02/23 1316  AST 28  ALT 20  ALKPHOS 40  BILITOT 1.1  PROT 7.3  ALBUMIN 3.6   Radiology Studies: CT ABDOMEN PELVIS W CONTRAST Result Date: 02/02/2023 CLINICAL DATA:  Left-sided abdominal pain, nausea EXAM: CT ABDOMEN AND PELVIS WITH CONTRAST TECHNIQUE: Multidetector CT imaging of the abdomen and pelvis was performed using the standard protocol following bolus administration of intravenous contrast. RADIATION DOSE REDUCTION: This exam was performed according to the departmental dose-optimization program which includes automated exposure control, adjustment of the mA and/or kV according to patient size and/or use of iterative reconstruction technique. CONTRAST:  OMNIPAQUE IOHEXOL 300 MG/ML  SOLN COMPARISON:  None Available. FINDINGS: Lower chest: No pleural or pericardial effusion. Coarse aortic calcifications. Visualized lung bases clear. Hepatobiliary: Mildly nodular hepatic contour. No calcified gallstones. No biliary ductal dilatation. Gallbladder nondistended.  Subcentimeter coarse calcification in hepatic segment 7. Pancreas: Unremarkable. No pancreatic ductal dilatation or surrounding inflammatory changes. Spleen: Normal in size without focal abnormality. Adrenals/Urinary Tract: No adrenal mass. Bilateral urolithiasis, largest 5 mm left lower pole, on the right47mm lower pole collecting system. No hydronephrosis or ureteral calculus. Urinary bladder incompletely distended. Stomach/Bowel: Stomach physiologically distended by ingested fluid. Small bowel decompressed. Normal appendix. The colon is partially distended, without acute finding. Vascular/Lymphatic: Scattered calcified aortic plaque without AAA. Portal vein patent. No abdominal or pelvic adenopathy. Reproductive: 5 cm -1 HU simple appearing right adnexal cystic process. Uterus unremarkable. Other: No ascites.  No free air. Musculoskeletal: Partially calcified subcutaneous injection granulomas in the anterior abdominal wall. 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 IMPRESSION: 1. No acute findings. 2. Bilateral nephrolithiasis. 3. 5 cm right adnexal cyst. Recommend follow-up US in 6-12 months. Note: This recommendation does not apply  to premenarchal patients and to those with increased risk (genetic, family history, elevated tumor markers or other high-risk factors) of ovarian cancer. Reference: JACR 2020 Feb; 17(2):248-254 4. Mildly nodular hepatic contour, suggesting cirrhosis. 5.  Aortic Atherosclerosis (ICD10-I70.0). Electronically Signed   By: Corlis Leak M.D.   On: 02/02/2023 15:17   Scheduled Meds:  insulin aspart  0-9 Units Subcutaneous Q6H   metoprolol tartrate  50 mg Oral BID   pantoprazole (PROTONIX) IV  40 mg Intravenous Q12H   sucralfate  1 g Oral TID WC & HS   Continuous Infusions:   LOS: 0 days   Shon Hale M.D on 02/03/2023 at 7:11 PM  Go to [email or web address] - for contact info  [facility] - Office  [phone]  If 7PM-7AM,  please contact night-coverage [email or web address] 02/03/2023, 7:11 PM  \

## 2023-02-03 NOTE — TOC Initial Note (Signed)
Transition of Care [facility]) - Initial/Assessment Note    Patient Details  Name: Ashley [name] MRN: [id number] Date of Birth: 1950/03/15  Transition of Care [facility]) CM/SW Contact:    Leitha Bleak, RN Phone Number: 02/03/2023, 1:39 PM  Clinical Narrative:    Patient admitted in OBS for GI bleed, EGD completed today. Per patient will not discharge home today, waiting for colonoscopy.  Patient lives alone and drives herself to appointments. No needs at this time.          Expected Discharge Plan: Home/Self Care Barriers to Discharge: Continued Medical Work up   Patient Goals and CMS Choice Patient states their goals for this hospitalization and ongoing recovery are:: to get better CMS Medicare.gov Compare Post Acute Care list provided to:: Patient Choice offered to / list presented to : Patient    Expected Discharge Plan and Services      Living arrangements for the past 2 months: Single Family Home                     Prior Living Arrangements/Services Living arrangements for the past 2 months: Single Family Home Lives with:: Self Patient language and need for interpreter reviewed:: Yes Do you feel safe going back to the place where you live?: Yes      Need for Family Participation in Patient Care: Yes (Comment) Care giver support system in place?: Yes (comment)   Criminal Activity/Legal Involvement Pertinent to Current Situation/Hospitalization: No - Comment as needed  Activities of Daily Living   ADL Screening (condition at time of admission) Independently performs ADLs?: Yes (appropriate for developmental age) Is the patient deaf or have difficulty hearing?: No Does the patient have difficulty seeing, even when wearing glasses/contacts?: No Does the patient have difficulty concentrating, remembering, or making decisions?: No  Permission Sought/Granted     Affect (typically observed): Accepting Orientation: : Oriented to Self, Oriented to Place, Oriented to  Time,  Oriented to Situation Alcohol / Substance Use: Not Applicable Psych Involvement: No (comment)  Admission diagnosis:  Melena [K92.1] Upper GI bleeding [K92.2] GI bleed [K92.2] Black stool [K92.1] Right ovarian cyst [N83.201] On anticoagulant therapy [Z79.01] Patient Active Problem List   Diagnosis Date Noted   GI bleed 02/02/2023   Adnexal cyst 02/02/2023   Aortic valve stenosis, mild 03/22/2022   Persistent atrial fibrillation (HCC)    Severe obesity (BMI >= 40) (HCC) 07/30/2014   Hyperlipidemia 05/31/2010   HTN (hypertension) 05/31/2010   Diabetes mellitus treated with oral medication (HCC) 05/31/2010   Peripheral edema 05/31/2010   PCP:  Bennie Pierini, FNP Pharmacy:   [facility] - [place], [place] - [address] [place] [address] Phone: [phone] Fax: [phone]     Social Drivers of Health (SDOH) Social History: SDOH Screenings   Food Insecurity: No Food Insecurity (02/02/2023)  Housing: Low Risk  (02/02/2023)  Transportation Needs: No Transportation Needs (02/02/2023)  Utilities: Not At Risk (02/02/2023)  Alcohol Screen: Low Risk  (07/25/2022)  Depression (PHQ2-9): Low Risk  (12/16/2022)  Recent Concern: Depression (PHQ2-9) - Medium Risk (09/26/2022)  Financial Resource Strain: Low Risk  (07/25/2022)  Physical Activity: Insufficiently Active (07/25/2022)  Social Connections: Moderately Isolated (07/25/2022)  Stress: No Stress Concern Present (07/25/2022)  Tobacco Use: Low Risk  (02/03/2023)   SDOH Interventions:    Readmission Risk Interventions     No data to display

## 2023-02-04 DIAGNOSIS — I10 Essential (primary) hypertension: Secondary | ICD-10-CM | POA: Diagnosis not present

## 2023-02-04 DIAGNOSIS — Z7984 Long term (current) use of oral hypoglycemic drugs: Secondary | ICD-10-CM | POA: Diagnosis not present

## 2023-02-04 DIAGNOSIS — E119 Type 2 diabetes mellitus without complications: Secondary | ICD-10-CM | POA: Diagnosis not present

## 2023-02-04 DIAGNOSIS — I4819 Other persistent atrial fibrillation: Secondary | ICD-10-CM | POA: Diagnosis not present

## 2023-02-04 LAB — CBC
HCT: 26.2 % — ABNORMAL LOW (ref 36.0–46.0)
Hemoglobin: 8.3 g/dL — ABNORMAL LOW (ref 12.0–15.0)
MCH: 31.6 pg (ref 26.0–34.0)
MCHC: 31.7 g/dL (ref 30.0–36.0)
MCV: 99.6 fL (ref 80.0–100.0)
Platelets: 198 10*3/uL (ref 150–400)
RBC: 2.63 MIL/uL — ABNORMAL LOW (ref 3.87–5.11)
RDW: 14 % (ref 11.5–15.5)
WBC: 9.8 10*3/uL (ref 4.0–10.5)
nRBC: 0 % (ref 0.0–0.2)

## 2023-02-04 LAB — GLUCOSE, CAPILLARY
Glucose-Capillary: 111 mg/dL — ABNORMAL HIGH (ref 70–99)
Glucose-Capillary: 142 mg/dL — ABNORMAL HIGH (ref 70–99)
Glucose-Capillary: 168 mg/dL — ABNORMAL HIGH (ref 70–99)
Glucose-Capillary: 188 mg/dL — ABNORMAL HIGH (ref 70–99)
Glucose-Capillary: 195 mg/dL — ABNORMAL HIGH (ref 70–99)

## 2023-02-04 NOTE — Progress Notes (Signed)
PROGRESS NOTE  Ashley [name], is a 72 y.o. female, DOB - [birth year], [id number]  Admit date - 02/02/2023   Admitting Physician Donyale Berthold Mariea Clonts, MD  Outpatient Primary MD for the patient is Bennie Pierini, FNP  LOS - 1  Chief Complaint  Patient presents with   Abdominal Pain       Brief Narrative:  72 y.o. female with medical history significant for diabetes mellitus, hypertension, atrial fibrillation on chronic anticoagulation, AV stenosis admitted on 02/02/2023 with acute GI bleed (presented with melena and hematemesis) in the setting of anticoagulation with Eliquis and Celebrex use    -Assessment and Plan: 1)Acute GI bleed GD on 02/03/2023 showed- No gross lesions at the esophagus - Erythematous mucosa in the antrum. Biopsied. - Non- bleeding duodenal ulcer with a flat pigmented spot ( Forrest Class IIc) . Injected. -PPI twice daily advised -Carafate as ordered 02/04/23 -Hgb currently around 8.3 down from baseline between 12 and 13 --Tolerated clear liquid diet well , -okay to advance diet today  -Avoidance of NSAIDs -Continue to hold Eliquis for another 24 hours- --Monitor H&H  2)Nephrolithiasis-- CT abdomen and pelvis with contrast without acute findings shows bilateral nephrolithiasis.   -Start Flomax to aid passage of kidney stones 5 cm right adnexal cyst follow-up recommended--outpatient imaging advised  3)Persistent atrial fibrillation (HCC) -Hold Eliquis for another 24 hours as above due to GI bleed -Continue metoprolol for rate control  4)Adnexal cyst Incidental finding on CT abdomen and pelvis with contrast/ 02/02/23-  5 cm right adnexal cyst. Recommend follow-up US in 6-12 months. Note: This recommendation does not apply to premenarchal patients and to those with increased risk (genetic, family history, elevated tumor markers or other high-risk factors) of ovarian cancer. -Follow-up as outpatient for repeat imaging  5)DM2 - A1c 7.0--sliding for  diabetic control PTA Use Novolog/Humalog Sliding scale insulin with Accu-Cheks/Fingersticks as ordered  -Hold metformin, glipizide  6)HTN (hypertension) Stable. -Hold clonidine 0.2, lisinopril 40 mg, and Lasix 20 mg in the setting of GI bleed -c/n  metoprolol 50 twice daily for atrial fibrillation  Status is: Inpatient   Disposition: The patient is from: Home              Anticipated d/c is to: Home              Anticipated d/c date is: 1 day              Patient currently is not medically stable to d/c. Barriers: Not Clinically Stable-   Code Status :  -  Code Status: Full Code   Family Communication:    NA (patient is alert, awake and coherent)   DVT Prophylaxis  :   - SCDs   SCDs Start: 02/02/23 2016   Lab Results  Component Value Date   PLT 198 02/04/2023   Inpatient Medications  Scheduled Meds:  insulin aspart  0-9 Units Subcutaneous Q6H   metoprolol tartrate  50 mg Oral BID   pantoprazole (PROTONIX) IV  40 mg Intravenous Q12H   sucralfate  1 g Oral TID WC & HS   tamsulosin  0.4 mg Oral QPC supper   Continuous Infusions: PRN Meds:.acetaminophen **OR** acetaminophen, morphine injection, ondansetron **OR** ondansetron (ZOFRAN) IV, polyethylene glycol   Anti-infectives (From admission, onward)    None        Subjective: Johnesha Becker today has no fevers, no emesis,  No chest pain,   - -Tolerated clear liquid diet well -Okay to advance diet -Drop in H&H noted -Denies  further bleeding   Objective: Vitals:   02/03/23 1926 02/04/23 0351 02/04/23 1318 02/04/23 1354  BP: 131/63 127/69 (!) 130/53 (!) 130/53  Pulse:  73 (!) 59 (!) 59  Resp: 18 20 18 18   Temp: 98.1 F (36.7 C) 98.7 F (37.1 C) 97.9 F (36.6 C) 97.9 F (36.6 C)  TempSrc: Oral Oral Oral Oral  SpO2: 99% 99% 93% 95%  Weight:      Height:        Intake/Output Summary (Last 24 hours) at 02/04/2023 1932 Last data filed at 02/04/2023 1700 Gross per 24 hour  Intake 600 ml  Output 1100 ml   Net -500 ml   Filed Weights   02/02/23 2013 02/03/23 0900  Weight: 113 kg 119.7 kg    Physical Exam  Gen:- Awake Alert,  in no apparent distress  HEENT:- [place].AT, No sclera icterus Neck-Supple Neck,No JVD,.  Lungs-  CTAB , fair symmetrical air movement CV- S1, S2 normal, regular , 2/6 SM Abd-  +ve B.Sounds, Abd Soft, mild epigastric tenderness, without rebound or guarding Extremity/Skin:- No  edema, pedal pulses present  Psych-affect is appropriate, oriented x3 Neuro-no new focal deficits, no tremors  Data Reviewed: I have personally reviewed following labs and imaging studies  CBC: Recent Labs  Lab 02/02/23 1316 02/02/23 1947 02/03/23 0437 02/04/23 0446  WBC 12.1*  --  13.0* 9.8  HGB 12.1 9.7* 9.2* 8.3*  HCT 36.8 29.5* 28.0* 26.2*  MCV 97.9  --  97.2 99.6  PLT 269  --  218 198   Basic Metabolic Panel: Recent Labs  Lab 02/02/23 1316 02/03/23 0437  NA 139 138  K 4.8 4.3  CL 105 107  CO2 21* 22  GLUCOSE 192* 119*  BUN 56* 70*  CREATININE 0.60 0.66  CALCIUM 9.8 9.8   GFR: Estimated Creatinine Clearance: 81.3 mL/min (by C-G formula based on SCr of 0.66 mg/dL). Liver Function Tests: Recent Labs  Lab 02/02/23 1316  AST 28  ALT 20  ALKPHOS 40  BILITOT 1.1  PROT 7.3  ALBUMIN 3.6   Radiology Studies: No results found.  Scheduled Meds:  insulin aspart  0-9 Units Subcutaneous Q6H   metoprolol tartrate  50 mg Oral BID   pantoprazole (PROTONIX) IV  40 mg Intravenous Q12H   sucralfate  1 g Oral TID WC & HS   tamsulosin  0.4 mg Oral QPC supper   Continuous Infusions:   LOS: 1 day   Shon Hale M.D on 02/04/2023 at 7:32 PM  Go to [email or web address] - for contact info  [facility] - Office  [phone]  If 7PM-7AM, please contact night-coverage [email or web address] 02/04/2023, 7:32 PM  \

## 2023-02-05 DIAGNOSIS — K921 Melena: Secondary | ICD-10-CM

## 2023-02-05 DIAGNOSIS — I4819 Other persistent atrial fibrillation: Secondary | ICD-10-CM | POA: Diagnosis not present

## 2023-02-05 DIAGNOSIS — K274 Chronic or unspecified peptic ulcer, site unspecified, with hemorrhage: Secondary | ICD-10-CM

## 2023-02-05 DIAGNOSIS — K269 Duodenal ulcer, unspecified as acute or chronic, without hemorrhage or perforation: Secondary | ICD-10-CM | POA: Diagnosis not present

## 2023-02-05 DIAGNOSIS — I10 Essential (primary) hypertension: Secondary | ICD-10-CM | POA: Diagnosis not present

## 2023-02-05 DIAGNOSIS — D62 Acute posthemorrhagic anemia: Secondary | ICD-10-CM | POA: Diagnosis not present

## 2023-02-05 LAB — CBC
HCT: 23.7 % — ABNORMAL LOW (ref 36.0–46.0)
Hemoglobin: 7.9 g/dL — ABNORMAL LOW (ref 12.0–15.0)
MCH: 32.6 pg (ref 26.0–34.0)
MCHC: 33.3 g/dL (ref 30.0–36.0)
MCV: 97.9 fL (ref 80.0–100.0)
Platelets: 183 10*3/uL (ref 150–400)
RBC: 2.42 MIL/uL — ABNORMAL LOW (ref 3.87–5.11)
RDW: 13.8 % (ref 11.5–15.5)
WBC: 7.8 10*3/uL (ref 4.0–10.5)
nRBC: 0.3 % — ABNORMAL HIGH (ref 0.0–0.2)

## 2023-02-05 LAB — GLUCOSE, CAPILLARY
Glucose-Capillary: 160 mg/dL — ABNORMAL HIGH (ref 70–99)
Glucose-Capillary: 167 mg/dL — ABNORMAL HIGH (ref 70–99)
Glucose-Capillary: 178 mg/dL — ABNORMAL HIGH (ref 70–99)
Glucose-Capillary: 236 mg/dL — ABNORMAL HIGH (ref 70–99)
Glucose-Capillary: 288 mg/dL — ABNORMAL HIGH (ref 70–99)

## 2023-02-05 MED ORDER — SODIUM CHLORIDE 0.9 % IV SOLN
1.0000 g | INTRAVENOUS | Status: AC
  Administered 2023-02-05 – 2023-02-06 (×2): 1 g via INTRAVENOUS
  Filled 2023-02-05 (×2): qty 10

## 2023-02-05 NOTE — Progress Notes (Signed)
PROGRESS NOTE  Ashley [name], is a 72 y.o. female, DOB - 11/26/1950, [id number]  Admit date - 02/02/2023   Admitting Physician Aurore Redinger Mariea Clonts, MD  Outpatient Primary MD for the patient is Bennie Pierini, FNP  LOS - 2  Chief Complaint  Patient presents with   Abdominal Pain       Brief Narrative:  72 y.o. female with medical history significant for diabetes mellitus, hypertension, atrial fibrillation on chronic anticoagulation, AV stenosis admitted on 02/02/2023 with acute GI bleed (presented with melena and hematemesis) in the setting of anticoagulation with Eliquis and Celebrex use    -Assessment and Plan: 1)Acute GI bleed GD on 02/03/2023 showed- No gross lesions at the esophagus - Erythematous mucosa in the antrum. Biopsied. - Non- bleeding duodenal ulcer with a flat pigmented spot ( Forrest Class IIc) . Injected. -PPI twice daily advised -Carafate as ordered 02/05/23 -Hgb trending down currently 7.9 - baseline between 12 and 13 -Tolerating oral intake well, no further BMs -Avoidance of NSAIDs -Discussed with GI physician Dr. Tasia Catchings --recommends continue to hold Eliquis for another 24 hours- --Monitor H&H  2)Nephrolithiasis-- CT abdomen and pelvis with contrast without acute findings shows bilateral nephrolithiasis.   -c/n Flomax to aid passage of kidney stones 5 cm right adnexal cyst follow-up recommended--outpatient imaging advised  3)Persistent atrial fibrillation (HCC) -Hold Eliquis for another 24 hours as above due to GI bleed---please see #1 above -Continue metoprolol for rate control  4)Adnexal cyst Incidental finding on CT abdomen and pelvis with contrast/ 02/02/23-  5 cm right adnexal cyst. Recommend follow-up US in 6-12 months. Note: This recommendation does not apply to premenarchal patients and to those with increased risk (genetic, family history, elevated tumor markers or other high-risk factors) of ovarian cancer. -Follow-up as outpatient for  repeat imaging  5)DM2 - A1c 7.0--sliding for diabetic control PTA Use Novolog/Humalog Sliding scale insulin with Accu-Cheks/Fingersticks as ordered  -Hold metformin, glipizide  6)HTN (hypertension) Stable. -Hold clonidine 0.2, lisinopril 40 mg, and Lasix 20 mg in the setting of GI bleed -c/n  metoprolol 50 twice daily for atrial fibrillation  Status is: Inpatient   Disposition: The patient is from: Home              Anticipated d/c is to: Home              Anticipated d/c date is: 1 day              Patient currently is not medically stable to d/c. Barriers: Not Clinically Stable-   Code Status :  -  Code Status: Full Code   Family Communication:    NA (patient is alert, awake and coherent)   DVT Prophylaxis  :   - SCDs   SCDs Start: 02/02/23 2016   Lab Results  Component Value Date   PLT 183 02/05/2023   Inpatient Medications  Scheduled Meds:  insulin aspart  0-9 Units Subcutaneous Q6H   metoprolol tartrate  50 mg Oral BID   pantoprazole (PROTONIX) IV  40 mg Intravenous Q12H   sucralfate  1 g Oral TID WC & HS   tamsulosin  0.4 mg Oral QPC supper   Continuous Infusions:  cefTRIAXone (ROCEPHIN)  IV     PRN Meds:.acetaminophen **OR** acetaminophen, morphine injection, ondansetron **OR** ondansetron (ZOFRAN) IV, polyethylene glycol   Anti-infectives (From admission, onward)    Start     Dose/Rate Route Frequency Ordered Stop   02/05/23 1930  cefTRIAXone (ROCEPHIN) 1 g in sodium chloride 0.9 %  100 mL IVPB        1 g 200 mL/hr over 30 Minutes Intravenous Every 24 hours 02/05/23 1835 02/07/23 1929        Subjective: Ashley [name] today has no fevers, no emesis,  No chest pain,   - -Tolerating diet well -Complains of right second toe discomfort -No BM in last couple days   Objective: Vitals:   02/04/23 2055 02/05/23 0609 02/05/23 0914 02/05/23 1323  BP: 139/84 133/77 (!) 142/60 (!) 128/56  Pulse: 87 76 74 68  Resp:    17  Temp:  97.8 F (36.6 C)  98 F  (36.7 C)  TempSrc:  Oral  Oral  SpO2:  94%  96%  Weight:      Height:        Intake/Output Summary (Last 24 hours) at 02/05/2023 1836 Last data filed at 02/05/2023 1200 Gross per 24 hour  Intake 1200 ml  Output --  Net 1200 ml   Filed Weights   02/02/23 2013 02/03/23 0900  Weight: 113 kg 119.7 kg    Physical Exam  Gen:- Awake Alert,  in no apparent distress  HEENT:- [place].AT, No sclera icterus Neck-Supple Neck,No JVD,.  Lungs-  CTAB , fair symmetrical air movement CV- S1, S2 normal, regular , 2/6 SM Abd-  +ve B.Sounds, Abd Soft, no abdominal tenderness  -extremity/Skin:- No  edema, pedal pulses present  Psych-affect is appropriate, oriented x3 Neuro-no new focal deficits, no tremors  Data Reviewed: I have personally reviewed following labs and imaging studies  CBC: Recent Labs  Lab 02/02/23 1316 02/02/23 1947 02/03/23 0437 02/04/23 0446 02/05/23 0426  WBC 12.1*  --  13.0* 9.8 7.8  HGB 12.1 9.7* 9.2* 8.3* 7.9*  HCT 36.8 29.5* 28.0* 26.2* 23.7*  MCV 97.9  --  97.2 99.6 97.9  PLT 269  --  218 198 183   Basic Metabolic Panel: Recent Labs  Lab 02/02/23 1316 02/03/23 0437  NA 139 138  K 4.8 4.3  CL 105 107  CO2 21* 22  GLUCOSE 192* 119*  BUN 56* 70*  CREATININE 0.60 0.66  CALCIUM 9.8 9.8   GFR: Estimated Creatinine Clearance: 81.3 mL/min (by C-G formula based on SCr of 0.66 mg/dL). Liver Function Tests: Recent Labs  Lab 02/02/23 1316  AST 28  ALT 20  ALKPHOS 40  BILITOT 1.1  PROT 7.3  ALBUMIN 3.6   Scheduled Meds:  insulin aspart  0-9 Units Subcutaneous Q6H   metoprolol tartrate  50 mg Oral BID   pantoprazole (PROTONIX) IV  40 mg Intravenous Q12H   sucralfate  1 g Oral TID WC & HS   tamsulosin  0.4 mg Oral QPC supper   Continuous Infusions:  cefTRIAXone (ROCEPHIN)  IV       LOS: 2 days   Shon Hale M.D on 02/05/2023 at 6:36 PM  Go to [email or web address] - for contact info  [facility] - Office  [phone]  If 7PM-7AM, please  contact night-coverage [email or web address] 02/05/2023, 6:36 PM  \

## 2023-02-05 NOTE — Progress Notes (Signed)
Gastroenterology & Hepatology   Interval History: Patient has downtrending hemoglobin but has not had bowel movement in past 2 days.  Is in good spirits and denies any complaints at this time  Inpatient Medications:  Current Facility-Administered Medications:    acetaminophen (TYLENOL) tablet 650 mg, 650 mg, Oral, Q6H PRN, 650 mg at 02/05/23 0918 **OR** acetaminophen (TYLENOL) suppository 650 mg, 650 mg, Rectal, Q6H PRN, Emokpae, Ejiroghene E, MD   insulin aspart (novoLOG) injection 0-9 Units, 0-9 Units, Subcutaneous, Q6H, Emokpae, Ejiroghene E, MD, 5 Units at 02/05/23 0914   metoprolol tartrate (LOPRESSOR) tablet 50 mg, 50 mg, Oral, BID, Emokpae, Ejiroghene E, MD, 50 mg at 02/05/23 0914   morphine (PF) 2 MG/ML injection 2 mg, 2 mg, Intravenous, Q4H PRN, Emokpae, Ejiroghene E, MD   ondansetron (ZOFRAN) tablet 4 mg, 4 mg, Oral, Q6H PRN **OR** ondansetron (ZOFRAN) injection 4 mg, 4 mg, Intravenous, Q6H PRN, Emokpae, Ejiroghene E, MD, 4 mg at 02/03/23 0019   pantoprazole (PROTONIX) injection 40 mg, 40 mg, Intravenous, Q12H, Emokpae, Courage, MD, 40 mg at 02/05/23 0915   polyethylene glycol (MIRALAX / GLYCOLAX) packet 17 g, 17 g, Oral, Daily PRN, Emokpae, Ejiroghene E, MD   sucralfate (CARAFATE) 1 GM/10ML suspension 1 g, 1 g, Oral, TID WC & HS, Emokpae, Courage, MD, 1 g at 02/05/23 1119   tamsulosin (FLOMAX) capsule 0.4 mg, 0.4 mg, Oral, QPC supper, Emokpae, Courage, MD, 0.4 mg at 02/04/23 1708   I/O    Intake/Output Summary (Last 24 hours) at 02/05/2023 1140 Last data filed at 02/05/2023 0900 Gross per 24 hour  Intake 1200 ml  Output 200 ml  Net 1000 ml     Physical Exam: Temp:  [97.8 F (36.6 C)-98.1 F (36.7 C)] 97.8 F (36.6 C) (12/29 0609) Pulse Rate:  [59-87] 74 (12/29 0914) Resp:  [18] 18 (12/28 1354) BP: (130-142)/(53-84) 142/60 (12/29 0914) SpO2:  [93 %-95 %] 94 % (12/29 0609)  Temp (24hrs), Avg:97.9 F (36.6 C), Min:97.8 F (36.6 C), Max:98.1 F (36.7 C)  GENERAL: The  patient is AO x3, in no acute distress. HEENT: Head is normocephalic and atraumatic. EOMI are intact. Mouth is well hydrated and without lesions. NECK: Supple. No masses LUNGS: Clear to auscultation. No presence of rhonchi/wheezing/rales. Adequate chest expansion HEART: RRR, normal s1 and s2. ABDOMEN: Soft, nontender, no guarding, no peritoneal signs, and nondistended. BS +. No masses. EXTREMITIES: Without any cyanosis, clubbing, rash, lesions or edema. NEUROLOGIC: AOx3, no focal motor deficit. SKIN: no jaundice, no rashes  Laboratory Data: CBC:     Component Value Date/Time   WBC 7.8 02/05/2023 0426   RBC 2.42 (L) 02/05/2023 0426   HGB 7.9 (L) 02/05/2023 0426   HGB 13.4 09/26/2022 1039   HCT 23.7 (L) 02/05/2023 0426   HCT 40.2 09/26/2022 1039   PLT 183 02/05/2023 0426   PLT 214 09/26/2022 1039   MCV 97.9 02/05/2023 0426   MCV 97 09/26/2022 1039   MCH 32.6 02/05/2023 0426   MCHC 33.3 02/05/2023 0426   RDW 13.8 02/05/2023 0426   RDW 12.6 09/26/2022 1039   LYMPHSABS 1.8 09/26/2022 1039   EOSABS 0.1 09/26/2022 1039   BASOSABS 0.1 09/26/2022 1039   COAG: No results found for: "INR", "PROTIME"  BMP:     Latest Ref Rng & Units 02/03/2023    4:37 AM 02/02/2023    1:16 PM 09/26/2022   10:39 AM  BMP  Glucose 70 - 99 mg/dL 960  454  098   BUN 8 -  23 mg/dL 70  56  24   Creatinine 0.44 - 1.00 mg/dL 4.78  2.95  6.21   BUN/Creat Ratio 12 - 28   39   Sodium 135 - 145 mmol/L 138  139  141   Potassium 3.5 - 5.1 mmol/L 4.3  4.8  4.9   Chloride 98 - 111 mmol/L 107  105  103   CO2 22 - 32 mmol/L 22  21  22    Calcium 8.9 - 10.3 mg/dL 9.8  9.8  9.8     HEPATIC:     Latest Ref Rng & Units 02/02/2023    1:16 PM 09/26/2022   10:39 AM 06/17/2022   10:35 AM  Hepatic Function  Total Protein 6.5 - 8.1 g/dL 7.3  7.2  6.3   Albumin 3.5 - 5.0 g/dL 3.6  4.2  3.7   AST 15 - 41 U/L 28  36  24   ALT 0 - 44 U/L 20  24  19    Alk Phosphatase 38 - 126 U/L 40  55  51   Total Bilirubin <1.2 mg/dL  1.1  0.8  0.6     CARDIAC: No results found for: "CKTOTAL", "CKMB", "CKMBINDEX", "TROPONINI"    Imaging: I personally reviewed and interpreted the available labs, imaging and endoscopic files.   Assessment/Plan:  This is a 72 y.o. female with diabetes hypertension A-fib on Eliquis, AV stenosis presented with left abdominal pain and black-colored stools.  GI was consulted for melena and drop in hemoglobin concerning for upper GI bleed . Patient underwent EGD found to have Forrest IIC duodenal ulcer  # Peptic ulcer disease   Patient underwent upper endoscopy 02/03/2023 found to have Forrest 2C duodenal bulb ulcer status post epi injection.  Patient have not had any overt bleeding for at least past 3 days and no bowel movement for past 2 days  Hemoglobin continues to trickle down slightly from 9.7>7.9 today without any overt bleed.  Does not appear patient is actively bleeding at this may be equilibrium  Recommendation :  -Hold Eliquis for next 24 hours and may start tomorrow if hemoglobin is stable while patient is closely monitored  -Will consider repeat EGD in 8-12 weeks to assess healing of ulcer given large size   -Antibiotics for total of 5 days as concerns for cirrhotic and GI bleeding ( patient is currently also being treated for UTI)   -Outpatient GI clinic follow up for management of cirrhosis ( new diagnosis) and Colon cancer screening ( patient never had one)    Vista Lawman, MD Gastroenterology and Hepatology [facility] Gastroenterology   This chart has been completed using [facility] Dictation software, and while attempts have been made to ensure accuracy , certain words and phrases may not be transcribed as intended

## 2023-02-05 NOTE — Progress Notes (Signed)
Ed was called by the nurse to attempt an ultrasound iv.

## 2023-02-05 NOTE — Plan of Care (Signed)

## 2023-02-05 NOTE — Progress Notes (Addendum)
Patient states her toe (2nd toe from the right) on her right foot is hurting and wanted to be sure it was not infected. Toe is not red, but does look slightly swollen. MD Emokpae notified.

## 2023-02-06 ENCOUNTER — Telehealth (INDEPENDENT_AMBULATORY_CARE_PROVIDER_SITE_OTHER): Payer: Self-pay | Admitting: Gastroenterology

## 2023-02-06 DIAGNOSIS — R710 Precipitous drop in hematocrit: Secondary | ICD-10-CM

## 2023-02-06 DIAGNOSIS — K921 Melena: Secondary | ICD-10-CM | POA: Diagnosis not present

## 2023-02-06 DIAGNOSIS — K7689 Other specified diseases of liver: Secondary | ICD-10-CM

## 2023-02-06 DIAGNOSIS — K274 Chronic or unspecified peptic ulcer, site unspecified, with hemorrhage: Secondary | ICD-10-CM | POA: Diagnosis not present

## 2023-02-06 DIAGNOSIS — Z7984 Long term (current) use of oral hypoglycemic drugs: Secondary | ICD-10-CM | POA: Diagnosis not present

## 2023-02-06 DIAGNOSIS — I10 Essential (primary) hypertension: Secondary | ICD-10-CM | POA: Diagnosis not present

## 2023-02-06 DIAGNOSIS — K269 Duodenal ulcer, unspecified as acute or chronic, without hemorrhage or perforation: Secondary | ICD-10-CM | POA: Diagnosis not present

## 2023-02-06 DIAGNOSIS — E119 Type 2 diabetes mellitus without complications: Secondary | ICD-10-CM | POA: Diagnosis not present

## 2023-02-06 LAB — SURGICAL PATHOLOGY

## 2023-02-06 LAB — GLUCOSE, CAPILLARY
Glucose-Capillary: 171 mg/dL — ABNORMAL HIGH (ref 70–99)
Glucose-Capillary: 255 mg/dL — ABNORMAL HIGH (ref 70–99)
Glucose-Capillary: 220 mg/dL — ABNORMAL HIGH (ref 70–99)
Glucose-Capillary: 245 mg/dL — ABNORMAL HIGH (ref 70–99)

## 2023-02-06 LAB — CBC
HCT: 23.8 % — ABNORMAL LOW (ref 36.0–46.0)
Hemoglobin: 7.9 g/dL — ABNORMAL LOW (ref 12.0–15.0)
MCH: 33.1 pg (ref 26.0–34.0)
MCHC: 33.2 g/dL (ref 30.0–36.0)
MCV: 99.6 fL (ref 80.0–100.0)
Platelets: 195 10*3/uL (ref 150–400)
RBC: 2.39 MIL/uL — ABNORMAL LOW (ref 3.87–5.11)
RDW: 14.1 % (ref 11.5–15.5)
WBC: 7.1 10*3/uL (ref 4.0–10.5)
nRBC: 0.3 % — ABNORMAL HIGH (ref 0.0–0.2)

## 2023-02-06 MED ORDER — INSULIN ASPART 100 UNIT/ML IJ SOLN
0.0000 [IU] | Freq: Three times a day (TID) | INTRAMUSCULAR | Status: DC
  Administered 2023-02-07: 11 [IU] via SUBCUTANEOUS
  Administered 2023-02-07: 3 [IU] via SUBCUTANEOUS

## 2023-02-06 MED ORDER — APIXABAN 5 MG PO TABS
5.0000 mg | ORAL_TABLET | Freq: Two times a day (BID) | ORAL | Status: DC
  Administered 2023-02-06 – 2023-02-07 (×3): 5 mg via ORAL
  Filled 2023-02-06 (×3): qty 1

## 2023-02-06 MED ORDER — INSULIN ASPART 100 UNIT/ML IJ SOLN
3.0000 [IU] | Freq: Once | INTRAMUSCULAR | Status: AC
  Administered 2023-02-06: 3 [IU] via SUBCUTANEOUS

## 2023-02-06 MED ORDER — POLYETHYLENE GLYCOL 3350 17 G PO PACK
17.0000 g | PACK | Freq: Every day | ORAL | Status: DC
  Administered 2023-02-06 – 2023-02-07 (×2): 17 g via ORAL
  Filled 2023-02-06 (×2): qty 1

## 2023-02-06 MED ORDER — INSULIN ASPART 100 UNIT/ML IJ SOLN
0.0000 [IU] | Freq: Every day | INTRAMUSCULAR | Status: DC
  Administered 2023-02-06: 2 [IU] via SUBCUTANEOUS

## 2023-02-06 NOTE — Progress Notes (Signed)
Mobility Specialist Progress Note:    02/06/23 1123  Mobility  Activity Ambulated with assistance in hallway  Level of Assistance Standby assist, set-up cues, supervision of patient - no hands on  Assistive Device Front wheel walker  Distance Ambulated (ft) 100 ft  Range of Motion/Exercises Active;All extremities  Activity Response Tolerated well  Mobility Referral Yes  Mobility visit 1 Mobility  Mobility Specialist Start Time (ACUTE ONLY) 1045  Mobility Specialist Stop Time (ACUTE ONLY) 1055  Mobility Specialist Time Calculation (min) (ACUTE ONLY) 10 min   Pt received in chair, agreeable to mobility. Required supervision to stand and ambulate with RW. Tolerated well, asx throughout. Returned pt to room, left in chair. Call bell in reach, all needs met.   Lawerance Bach Mobility Specialist Please contact via Special educational needs teacher or  Rehab office at [phone]

## 2023-02-06 NOTE — Telephone Encounter (Signed)
See progress note from 02/06/23.

## 2023-02-06 NOTE — Progress Notes (Addendum)
  Subjective: Feeling good this morning. Was able to ambulate around the hall. Denies abdominal pain, nausea or vomiting. No BMs since admission.   Objective: Vital signs in last 24 hours: Temp:  [98 F (36.7 C)-98.5 F (36.9 C)] 98.1 F (36.7 C) (12/30 0431) Pulse Rate:  [64-83] 64 (12/30 0431) Resp:  [16-17] 16 (12/30 0431) BP: (110-128)/(46-56) 125/50 (12/30 0431) SpO2:  [96 %-99 %] 97 % (12/30 0431) Last BM Date : 02/02/23 General:   Alert and oriented, pleasant Head:  Normocephalic and atraumatic. Eyes:  No icterus, sclera clear. Conjuctiva pink.   Heart:  S1, S2 present, no murmurs noted.  Lungs: Clear to auscultation bilaterally, without wheezing, rales, or rhonchi.  Abdomen:  Bowel sounds present, soft, non-tender, non-distended. No HSM or hernias noted. No rebound or guarding. No masses appreciated  Neurologic:  Alert and  oriented x4;  grossly normal neurologically. Skin:  Warm and dry, intact without significant lesions.  Psych:  Alert and cooperative. Normal mood and affect.  Intake/Output from previous day: 12/29 0701 - 12/30 0700 In: 820.4 [P.O.:720; IV Piggyback:100.4] Out: -  Intake/Output this shift: Total I/O In: 240 [P.O.:240] Out: -   Lab Results: Recent Labs    02/04/23 0446 02/05/23 0426 02/06/23 0423  WBC 9.8 7.8 7.1  HGB 8.3* 7.9* 7.9*  HCT 26.2* 23.7* 23.8*  PLT 198 183 195    Assessment: Ashley [name] is a 72 year old female with diabetes, HTN, A-fib on Eliquis, AV stenosis presented with left abdominal pain and black-colored stools.  GI consulted for melena and drop in hemoglobin concerning for UGIB. Patient underwent EGD found to have Forrest IIC duodenal ulcer   PUD: EGD on 02/03/2023 with Forrest 2C duodenal ulcer, status post epi injection( possibly secondary to celebrex use). Hemoglobin has continued to trend down slightly from 9.7-7.9 though has remained at 7.9 since yesterday.  No overt GI bleeding for the past 3 days and no bowel  movements since admission though had not had solid food until yesterday, miralax was given x2 on  12/27 but none since. Tolerated her breakfast this morning without issue. Likely some aspect of equilibration playing a role in her low hemoglobin.   Cirrhosis: CT abdomen pelvis with contrast this admission with mildly nodular hepatic contour, suggestive of cirrhosis.  Labs on 12/26 with normal liver function.  Patient appears well compensated.  EGD this admission without esophageal varices.  Patient will require further evaluation and management of her cirrhosis as outpatient.   Plan: Continue PPI BID Continue with carafate QID Can restart Eliquis but will need to monitor closely for bleeding/decline in hgb.  repeat EGD in 8-12 weeks to assess healing of ulcer Continue antibiotics x 5 days due to concern for cirrhosis with GI bleeding. Outpatient colonoscopy for CRC screening Miralax 17g daily if no BMs by this afternoon Avoid all NSAIDs including celebrex  outpatient follow up in 2-3 weeks for anemia and newly diagnosed cirrhosis, repeat CBC 1 week  GI will sign off.   LOS: 3 days    02/06/2023, 9:27 AM  Ashley [name] L. Jeanmarie Hubert, MSN, APRN, AGNP-C Adult-Gerontology Nurse Practitioner [facility] at [facility]

## 2023-02-06 NOTE — Plan of Care (Signed)
  Problem: Health Behavior/Discharge Planning: Goal: Ability to manage health-related needs will improve Outcome: Progressing   Problem: Clinical Measurements: Goal: Diagnostic test results will improve Outcome: Progressing Goal: Respiratory complications will improve Outcome: Progressing Goal: Cardiovascular complication will be avoided Outcome: Progressing   Problem: Nutrition: Goal: Adequate nutrition will be maintained Outcome: Progressing

## 2023-02-06 NOTE — Inpatient Diabetes Management (Signed)
Inpatient Diabetes Program Recommendations  AACE/ADA: New Consensus Statement on Inpatient Glycemic Control (2015)  Target Ranges:  Prepandial:   less than 140 mg/dL      Peak postprandial:   less than 180 mg/dL (1-2 hours)      Critically ill patients:  140 - 180 mg/dL   Lab Results  Component Value Date   GLUCAP 255 (H) 02/06/2023   HGBA1C 7.5 (H) 02/02/2023    Review of Glycemic Control  Latest Reference Range & Units 02/06/23 07:29 02/06/23 11:45  Glucose-Capillary 70 - 99 mg/dL 161 (H) 096 (H)  (H): Data is abnormally high  Diabetes history: DM2 Outpatient Diabetes medications: Glipizide 5 m BID, Metformin 1000 mg BID Current orders for Inpatient glycemic control: Novolog 0-9 units Q6H  Inpatient Diabetes Program Recommendations:    Please consider:  Novolog 0-9 units TID and 0-5 at bedtime Novolog 2 units TID with meals if she consumes at least 50%  Will continue to follow while inpatient.  Thank you, Dulce Sellar, MSN, CDCES Diabetes Coordinator Inpatient Diabetes Program [phone] (team pager from 8a-5p)

## 2023-02-06 NOTE — Progress Notes (Signed)
PROGRESS NOTE  Ashley [name], is a 72 y.o. female, DOB - November 23, 1950, XBJ:[id number]  Admit date - 02/02/2023   Admitting Physician Christyl Osentoski Mariea Clonts, MD  Outpatient Primary MD for the patient is Bennie Pierini, FNP  LOS - 3  Chief Complaint  Patient presents with   Abdominal Pain       Brief Narrative:  72 y.o. female with medical history significant for diabetes mellitus, hypertension, atrial fibrillation on chronic anticoagulation, AV stenosis admitted on 02/02/2023 with acute GI bleed (presented with melena and hematemesis) in the setting of anticoagulation with Eliquis and Celebrex use    -Assessment and Plan: 1)Acute GI bleed EGD on 02/03/2023 showed- No gross lesions at the esophagus - Erythematous mucosa in the antrum. Biopsied. - Non- bleeding duodenal ulcer with a flat pigmented spot ( Forrest Class IIc) . Injected. -PPI twice daily advised -Carafate as ordered 02/06/23 -Hgb trending down -12.1 >9.7 >8.3 >7.9 >7.9 - baseline between 12 and 13 -Tolerating oral intake well, no further BMs -Avoidance of NSAIDs -No overt bleeding, actually no BM in the last 3 days or so -As Per GI service okay to resume Eliquis on 02/06/2023 --Monitor H&H  2)Nephrolithiasis-- CT abdomen and pelvis with contrast without acute findings shows bilateral nephrolithiasis.   -c/n Flomax to aid passage of kidney stones 5 cm right adnexal cyst follow-up recommended--outpatient imaging advised  3)Persistent atrial fibrillation (HCC) --Resume Eliquis as above #1 due to GI bleed---please see #1 above -Continue metoprolol for rate control  4)Adnexal cyst Incidental finding on CT abdomen and pelvis with contrast/ 02/02/23-  5 cm right adnexal cyst. Recommend follow-up US in 6-12 months. Note: This recommendation does not apply to premenarchal patients and to those with increased risk (genetic, family history, elevated tumor markers or other high-risk factors) of ovarian cancer. -Follow-up as  outpatient for repeat imaging  5)DM2 - A1c 7.0--sliding for diabetic control PTA Use Novolog/Humalog Sliding scale insulin with Accu-Cheks/Fingersticks as ordered  -Hold metformin, glipizide  6)HTN (hypertension) Stable. -Hold clonidine 0.2, lisinopril 40 mg, and Lasix 20 mg in the setting of GI bleed -c/n  metoprolol 50 twice daily for atrial fibrillation  Status is: Inpatient   Disposition: The patient is from: Home              Anticipated d/c is to: Home              Anticipated d/c date is: 1 day              Patient currently is not medically stable to d/c. Barriers: Not Clinically Stable-   Code Status :  -  Code Status: Full Code   Family Communication:    NA (patient is alert, awake and coherent)   DVT Prophylaxis  :   - SCDs   SCDs Start: 02/02/23 2016 apixaban (ELIQUIS) tablet 5 mg   Lab Results  Component Value Date   PLT 195 02/06/2023   Inpatient Medications  Scheduled Meds:  apixaban  5 mg Oral BID   insulin aspart  0-9 Units Subcutaneous Q6H   metoprolol tartrate  50 mg Oral BID   pantoprazole (PROTONIX) IV  40 mg Intravenous Q12H   polyethylene glycol  17 g Oral Daily   sucralfate  1 g Oral TID WC & HS   tamsulosin  0.4 mg Oral QPC supper   Continuous Infusions:  cefTRIAXone (ROCEPHIN)  IV 1 g (02/05/23 2310)   PRN Meds:.acetaminophen **OR** acetaminophen, morphine injection, ondansetron **OR** ondansetron (ZOFRAN) IV   Anti-infectives (  From admission, onward)    Start     Dose/Rate Route Frequency Ordered Stop   02/05/23 1930  cefTRIAXone (ROCEPHIN) 1 g in sodium chloride 0.9 % 100 mL IVPB        1 g 200 mL/hr over 30 Minutes Intravenous Every 24 hours 02/05/23 1835 02/07/23 1929       Subjective: Ashley [name] today has no fevers, no emesis,  No chest pain,   - -Eating and drinking well -No overt bleeding, actually no BM in the last 3 days or so -Patient reports fatigue but no dizziness   Objective: Vitals:   02/05/23 1323 02/05/23  2005 02/05/23 2309 02/06/23 0431  BP: (!) 128/56 (!) 110/46 (!) 110/46 (!) 125/50  Pulse: 68 83 83 64  Resp: 17 17  16   Temp: 98 F (36.7 C) 98.5 F (36.9 C)  98.1 F (36.7 C)  TempSrc: Oral Oral  Oral  SpO2: 96% 99%  97%  Weight:      Height:        Intake/Output Summary (Last 24 hours) at 02/06/2023 1209 Last data filed at 02/06/2023 1610 Gross per 24 hour  Intake 340.35 ml  Output --  Net 340.35 ml   Filed Weights   02/02/23 2013 02/03/23 0900  Weight: 113 kg 119.7 kg   Physical Exam Gen:- Awake Alert,  in no apparent distress  HEENT:- [place].AT, No sclera icterus Neck-Supple Neck,No JVD,.  Lungs-  CTAB , fair symmetrical air movement CV- S1, S2 normal, regular , 2/6 SM Abd-  +ve B.Sounds, Abd Soft, no abdominal tenderness  -extremity/Skin:- No  edema, pedal pulses present  Psych-affect is appropriate, oriented x3 Neuro-no new focal deficits, no tremors  Data Reviewed: I have personally reviewed following labs and imaging studies  CBC: Recent Labs  Lab 02/02/23 1316 02/02/23 1947 02/03/23 0437 02/04/23 0446 02/05/23 0426 02/06/23 0423  WBC 12.1*  --  13.0* 9.8 7.8 7.1  HGB 12.1 9.7* 9.2* 8.3* 7.9* 7.9*  HCT 36.8 29.5* 28.0* 26.2* 23.7* 23.8*  MCV 97.9  --  97.2 99.6 97.9 99.6  PLT 269  --  218 198 183 195   Basic Metabolic Panel: Recent Labs  Lab 02/02/23 1316 02/03/23 0437  NA 139 138  K 4.8 4.3  CL 105 107  CO2 21* 22  GLUCOSE 192* 119*  BUN 56* 70*  CREATININE 0.60 0.66  CALCIUM 9.8 9.8   GFR: Estimated Creatinine Clearance: 81.3 mL/min (by C-G formula based on SCr of 0.66 mg/dL). Liver Function Tests: Recent Labs  Lab 02/02/23 1316  AST 28  ALT 20  ALKPHOS 40  BILITOT 1.1  PROT 7.3  ALBUMIN 3.6   Scheduled Meds:  apixaban  5 mg Oral BID   insulin aspart  0-9 Units Subcutaneous Q6H   metoprolol tartrate  50 mg Oral BID   pantoprazole (PROTONIX) IV  40 mg Intravenous Q12H   polyethylene glycol  17 g Oral Daily   sucralfate  1 g  Oral TID WC & HS   tamsulosin  0.4 mg Oral QPC supper   Continuous Infusions:  cefTRIAXone (ROCEPHIN)  IV 1 g (02/05/23 2310)    LOS: 3 days   Shon Hale M.D on 02/06/2023 at 12:09 PM  Go to [email or web address] - for contact info  [facility] - Office  [phone]  If 7PM-7AM, please contact night-coverage [email or web address] 02/06/2023, 12:09 PM  \

## 2023-02-06 NOTE — Plan of Care (Signed)

## 2023-02-07 DIAGNOSIS — I10 Essential (primary) hypertension: Secondary | ICD-10-CM | POA: Diagnosis not present

## 2023-02-07 DIAGNOSIS — K274 Chronic or unspecified peptic ulcer, site unspecified, with hemorrhage: Secondary | ICD-10-CM | POA: Diagnosis not present

## 2023-02-07 DIAGNOSIS — I4819 Other persistent atrial fibrillation: Secondary | ICD-10-CM | POA: Diagnosis not present

## 2023-02-07 LAB — CBC
HCT: 24.9 % — ABNORMAL LOW (ref 36.0–46.0)
Hemoglobin: 7.9 g/dL — ABNORMAL LOW (ref 12.0–15.0)
MCH: 31.7 pg (ref 26.0–34.0)
MCHC: 31.7 g/dL (ref 30.0–36.0)
MCV: 100 fL (ref 80.0–100.0)
Platelets: 204 10*3/uL (ref 150–400)
RBC: 2.49 MIL/uL — ABNORMAL LOW (ref 3.87–5.11)
RDW: 14.6 % (ref 11.5–15.5)
WBC: 7.5 10*3/uL (ref 4.0–10.5)
nRBC: 1.3 % — ABNORMAL HIGH (ref 0.0–0.2)

## 2023-02-07 LAB — GLUCOSE, CAPILLARY
Glucose-Capillary: 167 mg/dL — ABNORMAL HIGH (ref 70–99)
Glucose-Capillary: 328 mg/dL — ABNORMAL HIGH (ref 70–99)

## 2023-02-07 MED ORDER — SUCRALFATE 1 G PO TABS: 1.0000 g | ORAL_TABLET | Freq: Four times a day (QID) | ORAL | 1 refills | Status: DC

## 2023-02-07 MED ORDER — LISINOPRIL 40 MG PO TABS: 20.0000 mg | ORAL_TABLET | Freq: Every day | ORAL | 1 refills | Status: DC

## 2023-02-07 MED ORDER — POLYETHYLENE GLYCOL 3350 17 G PO PACK: 17.0000 g | PACK | Freq: Every day | ORAL | Status: DC | PRN

## 2023-02-07 MED ORDER — PANTOPRAZOLE SODIUM 40 MG PO TBEC: 40.0000 mg | DELAYED_RELEASE_TABLET | Freq: Two times a day (BID) | ORAL | 2 refills | Status: DC

## 2023-02-07 MED ORDER — CLONIDINE HCL 0.1 MG PO TABS: 0.1000 mg | ORAL_TABLET | Freq: Two times a day (BID) | ORAL | 1 refills | Status: DC

## 2023-02-07 NOTE — Discharge Summary (Addendum)
 Ashley [name], is a 72 y.o. female  DOB 1950-06-20  MRN [id number].  Admission date:  02/02/2023  Admitting Physician  Rendall Carwin, MD  Discharge Date:  02/07/2023   Primary MD  Gladis Mustard, FNP  Recommendations for primary care physician for things to follow:  1)Avoid ibuprofen/Advil/Aleve/Motrin/Goody Powders/Naproxen/BC powders/Meloxicam /Diclofenac /Indomethacin and other Nonsteroidal anti-inflammatory medications as these will make you more likely to bleed and can cause stomach ulcers, can also cause Kidney problems.   2)Repeat CBC Blood Test on Friday 02/10/23  3)Please Follow up with Gastroenterologist Dr. Carnell 2 to 2 weeks due to ulcers and liver cirrhosis - -address [address], [place] [address],,[place] Number [phone]     Admission Diagnosis  Melena [K92.1] Upper GI bleeding [K92.2] GI bleed [K92.2] Black stool [K92.1] Right ovarian cyst [N83.201] On anticoagulant therapy [Z79.01] Acute GI bleeding [K92.2]   Discharge Diagnosis  Melena [K92.1] Upper GI bleeding [K92.2] GI bleed [K92.2] Black stool [K92.1] Right ovarian cyst [N83.201] On anticoagulant therapy [Z79.01] Acute GI bleeding [K92.2]    Principal Problem:   GI bleed Active Problems:   Persistent atrial fibrillation (HCC)   HTN (hypertension)   Diabetes mellitus treated with oral medication (HCC)   Adnexal cyst   Acute GI bleeding   Melena   Peptic ulcer disease with hemorrhage      Past Medical History:  Diagnosis Date   A-fib (HCC)    Depression    Diabetes mellitus without complication (HCC)    Heart disease    Hyperlipidemia    Hypertension    Hypertension     Past Surgical History:  Procedure Laterality Date   CYST EXCISION Left 03/15/2017   from buttocks   ORIF ANKLE FRACTURE Left 04/20/2017   Procedure: OPEN REDUCTION INTERNAL FIXATION (ORIF) LEFT ANKLE  FRACTURE;  Surgeon: Kay Kemps, MD;  Location: [facility] OR;  Service: Orthopedics;  Laterality: Left;     HPI  from the history and physical done on the day of admission:     Chief Complaint: Black Stools   HPI: Ashley [name] is a 72 y.o. female with medical history significant for diabetes mellitus, hypertension, atrial fibrillation on chronic anticoagulation, AV stenosis. Patient presented to the ED with complaints of left-sided abdominal pain and onset of black stools today.  She reports she has never had stools or dark in the past.  No prior vomiting until here in the ED, she had a large volume of coffee-ground emesis. She takes Celebrex  for pain, but denies other NSAID use.  She is on Eliquis  twice daily her last dose was this morning.   ED Course: Temperature 98.7.  Heart rate 82-118.  Respiratory presents with 19.  Blood pressure systolic 104-155.  Sats greater than 95% on room air.  Hemoglobin 12.1.  WBC 12.1.  UA not suggestive of UTI.   CT abdomen and pelvis with contrast-no acute findings, bilateral nephrolithiasis, 5 cm right adnexal cyst-ultrasound recommended in 6 to 12 months. EDP talked to GI recommended admission will see in consult in a.m.  for possible EGD, Protonix  twice daily.   Review of Systems: As per HPI all other systems reviewed and negative.     Hospital Course:    1)Acute GI bleed EGD on 02/03/2023 showed- No gross lesions at the esophagus - Erythematous mucosa in the antrum. Biopsied. - Non- bleeding duodenal ulcer with a flat pigmented spot ( Forrest Class IIc) . Injected. -Protonix  40mg   twice daily advised -Carafate  as ordered -Hgb trending down initially -12.1 >9.7 >8.3 >7.9 >7.9>>7.9 (stable over the last 3 days) - baseline between 12 and 13 -Tolerating oral intake well, no further BMs -Avoidance of NSAIDs -No overt bleeding, -Had brown stool on 02/07/2023,  -As Per GI service okay to resumed Eliquis  on 02/06/2023 -Repeat CBC on 02/10/23 -Outpatient  follow-up with Dr. Eartha advised   2)Nephrolithiasis-- CT abdomen and pelvis with contrast without acute findings shows bilateral nephrolithiasis.   -Asymptomatic, consider Flomax  to aid passage of kidney stones if becomes symptomatic 5 cm right adnexal cyst follow-up recommended--outpatient imaging advised   3)Persistent atrial fibrillation (HCC) --Resume Eliquis  as above #1 due to GI bleed---please see #1 above -Continue metoprolol  for rate control   4)Adnexal cyst Incidental finding on CT abdomen and pelvis with contrast/ 02/02/23-  5 cm right adnexal cyst. Recommend follow-up US  in 6-12 months. Note: This recommendation does not apply to premenarchal patients and to those with increased risk (genetic, family history, elevated tumor markers or other high-risk factors) of ovarian cancer. -Follow-up as outpatient for repeat imaging   5)DM2 - A1c 7.0--consistent with fair diabetic control PTA -Resume PTA glipizide  and metformin    6)HTN (hypertension) Stable. --Decrease clonidine  to  0.1mg  bid , decrease lisinopril  to 20 mg, and Lasix  20 mg in the setting of GI bleed -c/n  metoprolol  50 twice daily for atrial fibrillation  7)Morbid Obesity/Obesity class III  -Low calorie diet, portion control and increase physical activity discussed with patient -Body mass index is 47.52 kg/m.    Disposition: The patient is from: Home              Anticipated d/c is to: Home  Discharge Condition: stable  Follow UP   Follow-up Information     Eartha Flavors, Toribio, MD. Schedule an appointment as soon as possible for a visit in 2 week(s).   Specialty: Gastroenterology Why: - Peptic ulcer disease and liver cirrhosis follow-up Contact information: [address] [place] [address] [phone]                  Consults obtained - Gi  Diet and Activity recommendation:  As advised  Discharge Instructions    Discharge Instructions     Call MD for:   difficulty breathing, headache or visual disturbances   Complete by: As directed    Call MD for:  persistant dizziness or light-headedness   Complete by: As directed    Call MD for:  persistant nausea and vomiting   Complete by: As directed    Call MD for:  temperature >100.4   Complete by: As directed    Diet - low sodium heart healthy   Complete by: As directed    Discharge instructions   Complete by: As directed    1)Avoid ibuprofen/Advil/Aleve/Motrin/Goody Powders/Naproxen/BC powders/Meloxicam /Diclofenac /Indomethacin and other Nonsteroidal anti-inflammatory medications as these will make you more likely to bleed and can cause stomach ulcers, can also cause Kidney problems.   2)Repeat CBC Blood Test on Friday 02/10/23  3)Please Follow up with Gastroenterologist Dr. Carnell 2 to 2  weeks due to ulcers and liver cirrhosis - -address [address], [place] [address],,[place] Number [phone]   Increase activity slowly   Complete by: As directed          Discharge Medications     Allergies as of 02/07/2023       Reactions   Cipro  [ciprofloxacin  Hcl] Hives        Medication List     STOP taking these medications    celecoxib  200 MG capsule Commonly known as: CELEBREX        TAKE these medications    Accu-Chek Aviva Plus test strip Generic drug: glucose blood TEST BLOOD SUGAR TWICE DAILY   Accu-Chek Guide Me w/Device Kit TEST BLOOD SUGAR TWICE DAILY Dx E11.9   Accu-Chek Softclix Lancets lancets TEST BLOOD SUGAR TWICE DAILY   acetaminophen  500 MG tablet Commonly known as: TYLENOL  Take 1,000 mg by mouth 2 (two) times daily as needed for moderate pain (pain score 4-6) or headache.   albuterol  108 (90 Base) MCG/ACT inhaler Commonly known as: VENTOLIN  HFA INHALE 2 PUFFS INTO THE LUNGS EVERY 6 HOURS AS NEEDED FOR WHEEZE OR SHORTNESS OF BREATH   ALPRAZolam  0.25 MG tablet Commonly known as: XANAX  TAKE 1 TABLET DAILY AS NEEDED FOR  ANXIETY   amLODipine  5 MG tablet Commonly known as: NORVASC  TAKE 1 TABLET EVERY DAY   apixaban  5 MG Tabs tablet Commonly known as: Eliquis  Take 1 tablet (5 mg total) by mouth 2 (two) times daily.   CINNAMON PO Take 1 capsule by mouth daily.   cloNIDine  0.1 MG tablet Commonly known as: CATAPRES  Take 1 tablet (0.1 mg total) by mouth 2 (two) times daily. What changed:  medication strength how much to take   DropSafe Alcohol Prep 70 % Pads CHECK BLOOD SUGAR TWICE DAILY   FISH OIL PO Take 1 capsule by mouth daily.   furosemide  20 MG tablet Commonly known as: LASIX  Take 1 tablet (20 mg total) by mouth daily.   GINGER ROOT PO Take 1 capsule by mouth daily.   glipiZIDE  5 MG tablet Commonly known as: GLUCOTROL  Take 1 tablet (5 mg total) by mouth 2 (two) times daily.   lisinopril  40 MG tablet Commonly known as: ZESTRIL  Take 0.5 tablets (20 mg total) by mouth daily. What changed: how much to take   metFORMIN  1000 MG tablet Commonly known as: GLUCOPHAGE  Take 1 tablet (1,000 mg total) by mouth 2 (two) times daily with a meal.   metoprolol  tartrate 50 MG tablet Commonly known as: LOPRESSOR  TAKE 1 TABLET TWICE DAILY   pantoprazole  40 MG tablet Commonly known as: Protonix  Take 1 tablet (40 mg total) by mouth 2 (two) times daily.   polyethylene glycol 17 g packet Commonly known as: MIRALAX  / GLYCOLAX  Take 17 g by mouth daily as needed for mild constipation or moderate constipation.   simvastatin  40 MG tablet Commonly known as: ZOCOR  TAKE 1 TABLET EVERY EVENING   sucralfate  1 g tablet Commonly known as: Carafate  Take 1 tablet (1 g total) by mouth 4 (four) times daily.   TURMERIC CURCUMIN PO Take 1 capsule by mouth daily.   VITAMIN C PO Take 1 tablet by mouth at bedtime.   VITAMIN D-3 PO Take 1 tablet by mouth at bedtime.   ZINC PO Take 1 tablet by mouth at bedtime.        Major procedures and Radiology Reports - PLEASE review detailed and final reports  for all details, in brief -  CT ABDOMEN PELVIS W CONTRAST Result Date: 02/02/2023 CLINICAL DATA:  Left-sided abdominal pain, nausea EXAM: CT ABDOMEN AND PELVIS WITH CONTRAST TECHNIQUE: Multidetector CT imaging of the abdomen and pelvis was performed using the standard protocol following bolus administration of intravenous contrast. RADIATION DOSE REDUCTION: This exam was performed according to the departmental dose-optimization program which includes automated exposure control, adjustment of the mA and/or kV according to patient size and/or use of iterative reconstruction technique. CONTRAST:  OMNIPAQUE  IOHEXOL  300 MG/ML  SOLN COMPARISON:  None Available. FINDINGS: Lower chest: No pleural or pericardial effusion. Coarse aortic calcifications. Visualized lung bases clear. Hepatobiliary: Mildly nodular hepatic contour. No calcified gallstones. No biliary ductal dilatation. Gallbladder nondistended. Subcentimeter coarse calcification in hepatic segment 7. Pancreas: Unremarkable. No pancreatic ductal dilatation or surrounding inflammatory changes. Spleen: Normal in size without focal abnormality. Adrenals/Urinary Tract: No adrenal mass. Bilateral urolithiasis, largest 5 mm left lower pole, on the right61mm lower pole collecting system. No hydronephrosis or ureteral calculus. Urinary bladder incompletely distended. Stomach/Bowel: Stomach physiologically distended by ingested fluid. Small bowel decompressed. Normal appendix. The colon is partially distended, without acute finding. Vascular/Lymphatic: Scattered calcified aortic plaque without AAA. Portal vein patent. No abdominal or pelvic adenopathy. Reproductive: 5 cm -1 HU simple appearing right adnexal cystic process. Uterus unremarkable. Other: No ascites.  No free air. Musculoskeletal: Partially calcified subcutaneous injection granulomas in the anterior abdominal wall. 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  IMPRESSION: 1. No acute findings. 2. Bilateral nephrolithiasis. 3. 5 cm right adnexal cyst. Recommend follow-up US  in 6-12 months. Note: This recommendation does not apply to premenarchal patients and to those with increased risk (genetic, family history, elevated tumor markers or other high-risk factors) of ovarian cancer. Reference: JACR 2020 Feb; 17(2):248-254 4. Mildly nodular hepatic contour, suggesting cirrhosis. 5.  Aortic Atherosclerosis (ICD10-I70.0). Electronically Signed   By: JONETTA Faes M.D.   On: 02/02/2023 15:17   Today   Subjective    Ashley [name] today has no new complaints  - Ambulated in hallways with mobility specialist without chest pains palpitations dizziness or dyspnea on exertion -Had brown stool, no bleeding concerns          Patient has been seen and examined prior to discharge   Objective   Blood pressure 129/63, pulse 65, temperature 98.7 F (37.1 C), temperature source Oral, resp. rate 18, height 5' 2.5 (1.588 m), weight 119.7 kg, SpO2 99%.  No intake or output data in the 24 hours ending 02/07/23 1559  Exam Gen:- Awake Alert, no acute distress  HEENT:- [place].AT, No sclera icterus Neck-Supple Neck,No JVD,.  Lungs-  CTAB , good air movement bilaterally CV- S1, S2 normal, regular Abd-  +ve B.Sounds, Abd Soft, No tenderness,    Extremity/Skin:- No  edema,   good pulses Psych-affect is appropriate, oriented x3 Neuro-no new focal deficits, no tremors    Data Review   CBC w Diff:  Lab Results  Component Value Date   WBC 7.5 02/07/2023   HGB 7.9 (L) 02/07/2023   HGB 13.4 09/26/2022   HCT 24.9 (L) 02/07/2023   HCT 40.2 09/26/2022   PLT 204 02/07/2023   PLT 214 09/26/2022    CMP:  Lab Results  Component Value Date   NA 138 02/03/2023   NA 141 09/26/2022   K 4.3 02/03/2023   CL 107 02/03/2023   CO2 22 02/03/2023   BUN 70 (H) 02/03/2023   BUN 24 09/26/2022   CREATININE 0.66 02/03/2023  PROT 7.3 02/02/2023   PROT 7.2 09/26/2022   ALBUMIN 3.6  02/02/2023   ALBUMIN 4.2 09/26/2022   BILITOT 1.1 02/02/2023   BILITOT 0.8 09/26/2022   ALKPHOS 40 02/02/2023   AST 28 02/02/2023   ALT 20 02/02/2023  .  Total Discharge time is about 33 minutes  Rendall Carwin M.D on 02/07/2023 at 3:59 PM  Go to [email or web address] -  for contact info  [facility] - Office  [phone]

## 2023-02-07 NOTE — Progress Notes (Signed)
 Mobility Specialist Progress Note:    02/07/23 1135  Mobility  Activity Ambulated with assistance in hallway  Level of Assistance Standby assist, set-up cues, supervision of patient - no hands on  Assistive Device Front wheel walker  Distance Ambulated (ft) 100 ft  Range of Motion/Exercises Active;All extremities  Activity Response Tolerated well  Mobility Referral Yes  Mobility visit 1 Mobility  Mobility Specialist Start Time (ACUTE ONLY) 1125  Mobility Specialist Stop Time (ACUTE ONLY) 1135  Mobility Specialist Time Calculation (min) (ACUTE ONLY) 10 min   Pt received in chair, agreeable to mobility. Required supervision to stand and ambulate with RW. Tolerated well, asx throughout. Returned to room and left pt in chair. Call bell in reach, all needs met.   Sherrilee Ditty Mobility Specialist Please contact via Special Educational Needs Teacher or  Rehab office at [phone]

## 2023-02-07 NOTE — Consult Note (Signed)
 [facility] Liaison Note  02/07/2023  Ashley [name] 09/12/1950 [id number]  Location: RN Hospital Liaison screened the patient remotely at [facility].  Insurance: [facility]   Ashley [name] is a 72 y.o. female who is a Primary Care Patient of Gladis Mustard, FNP [facility]. The patient was screened for  readmission hospitalization with noted low risk score for unplanned readmission risk with 1 IP in 6 months.  The patient was assessed for potential Care Management service needs for post hospital transition for care coordination. Review of patient's electronic medical record reveals patient was admitted with GI Bleed. Pt will discharge today home with self care. No anticipated need presented at this time.  Plan: [facility] Liaison will continue to follow progress and disposition to asess for post hospital community care coordination/management needs.  Referral request for community care coordination: anticipate Transitions of Care Team follow up.   [facility] Care Management/Population Health does not replace or interfere with any arrangements made by the Inpatient Transition of Care team.   For questions contact:   Olam Ku, RN, [facility] Liaison [place]   [facility], Population Health Office Hours MTWF  8:00 am-6:00 pm Direct Dial: [phone] mobile [phone] [Office toll free line] Office Hours are M-F 8:30 - 5 pm [name]

## 2023-02-07 NOTE — Plan of Care (Signed)
  Problem: Education: Goal: Knowledge of General Education information will improve Description: Including pain rating scale, medication(s)/side effects and non-pharmacologic comfort measures Outcome: Progressing   Problem: Health Behavior/Discharge Planning: Goal: Ability to manage health-related needs will improve Outcome: Progressing   Problem: Pain Management: Goal: General experience of comfort will improve 02/07/2023 0435 by Tennie Heinz SAUNDERS, LPN Outcome: Progressing 02/07/2023 0433 by Tennie Heinz SAUNDERS, LPN Outcome: Progressing   Problem: Skin Integrity: Goal: Risk for impaired skin integrity will decrease Outcome: Progressing

## 2023-02-07 NOTE — Discharge Instructions (Signed)
 1)Avoid ibuprofen/Advil/Aleve/Motrin/Goody Powders/Naproxen/BC powders/Meloxicam /Diclofenac /Indomethacin and other Nonsteroidal anti-inflammatory medications as these will make you more likely to bleed and can cause stomach ulcers, can also cause Kidney problems.   2)Repeat CBC Blood Test on Friday 02/10/23  3)Please Follow up with Gastroenterologist Dr. Carnell 2 to 2 weeks due to ulcers and liver cirrhosis - -address [address], [address], [place] [address],,[place] Number [phone]

## 2023-02-09 ENCOUNTER — Other Ambulatory Visit (INDEPENDENT_AMBULATORY_CARE_PROVIDER_SITE_OTHER): Payer: Self-pay

## 2023-02-09 ENCOUNTER — Encounter (INDEPENDENT_AMBULATORY_CARE_PROVIDER_SITE_OTHER): Payer: Self-pay | Admitting: *Deleted

## 2023-02-09 ENCOUNTER — Telehealth: Payer: Self-pay | Admitting: *Deleted

## 2023-02-09 DIAGNOSIS — K274 Chronic or unspecified peptic ulcer, site unspecified, with hemorrhage: Secondary | ICD-10-CM

## 2023-02-09 DIAGNOSIS — K921 Melena: Secondary | ICD-10-CM

## 2023-02-09 DIAGNOSIS — K922 Gastrointestinal hemorrhage, unspecified: Secondary | ICD-10-CM

## 2023-02-09 NOTE — Telephone Encounter (Signed)
 I have place an order in Epic for a cbc to be done week of 02/13/2023 at [facility]. I have left a detailed message on patient vm, asked that she call me back to let me know she received the message.

## 2023-02-09 NOTE — Transitions of Care (Post Inpatient/ED Visit) (Signed)
   02/09/2023  Name: Ashley [name] MRN: [id number] DOB: 04-Jan-1951  Today's TOC FU Call Status: Today's TOC FU Call Status:: Unsuccessful Call (1st Attempt) Unsuccessful Call (1st Attempt) Date: 02/09/23  Attempted to reach the patient regarding the most recent Inpatient/ED visit.  Follow Up Plan: Additional outreach attempts will be made to reach the patient to complete the Transitions of Care (Post Inpatient/ED visit) call.   Mliss Creed [facility], BSN RN Care Manager/ Transition of Care [place]/ [facility] [phone]

## 2023-02-10 ENCOUNTER — Other Ambulatory Visit: Payer: Self-pay

## 2023-02-10 ENCOUNTER — Ambulatory Visit: Payer: Medicare HMO | Admitting: Nurse Practitioner

## 2023-02-10 ENCOUNTER — Encounter (INDEPENDENT_AMBULATORY_CARE_PROVIDER_SITE_OTHER): Payer: Self-pay | Admitting: *Deleted

## 2023-02-10 ENCOUNTER — Encounter: Payer: Self-pay | Admitting: Nurse Practitioner

## 2023-02-10 ENCOUNTER — Telehealth: Payer: Self-pay | Admitting: *Deleted

## 2023-02-10 VITALS — BP 152/69 | HR 67 | Temp 98.0°F | Resp 20 | Ht 62.0 in | Wt 257.0 lb

## 2023-02-10 DIAGNOSIS — K2991 Gastroduodenitis, unspecified, with bleeding: Secondary | ICD-10-CM

## 2023-02-10 DIAGNOSIS — E119 Type 2 diabetes mellitus without complications: Secondary | ICD-10-CM

## 2023-02-10 DIAGNOSIS — N393 Stress incontinence (female) (male): Secondary | ICD-10-CM

## 2023-02-10 DIAGNOSIS — Z7984 Long term (current) use of oral hypoglycemic drugs: Secondary | ICD-10-CM

## 2023-02-10 DIAGNOSIS — Z09 Encounter for follow-up examination after completed treatment for conditions other than malignant neoplasm: Secondary | ICD-10-CM

## 2023-02-10 DIAGNOSIS — K922 Gastrointestinal hemorrhage, unspecified: Secondary | ICD-10-CM | POA: Diagnosis not present

## 2023-02-10 MED ORDER — SITAGLIPTIN PHOSPHATE 100 MG PO TABS: 100.0000 mg | ORAL_TABLET | Freq: Every day | ORAL | 0 refills | Status: DC

## 2023-02-10 MED ORDER — TOLTERODINE TARTRATE ER 2 MG PO CP24: 2.0000 mg | ORAL_CAPSULE | Freq: Every day | ORAL | 1 refills | Status: DC

## 2023-02-10 MED ORDER — SITAGLIPTIN PHOSPHATE 100 MG PO TABS: 100.0000 mg | ORAL_TABLET | Freq: Every day | ORAL | 2 refills | Status: DC

## 2023-02-10 MED ORDER — TOLTERODINE TARTRATE ER 2 MG PO CP24: 2.0000 mg | ORAL_CAPSULE | Freq: Every day | ORAL | 0 refills | Status: DC

## 2023-02-10 MED ORDER — MIRABEGRON ER 25 MG PO TB24: 25.0000 mg | ORAL_TABLET | Freq: Every day | ORAL | 2 refills | Status: DC
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
## 2023-02-10 NOTE — Transitions of Care (Post Inpatient/ED Visit) (Signed)
   02/10/2023  Name: Ashley [name] MRN: [id number] DOB: 1950-06-12  Today's TOC FU Call Status: Today's TOC FU Call Status:: Unsuccessful Call (2nd Attempt) Unsuccessful Call (2nd Attempt) Date: 02/10/23  Attempted to reach the patient regarding the most recent Inpatient/ED visit.  Follow Up Plan: Additional outreach attempts will be made to reach the patient to complete the Transitions of Care (Post Inpatient/ED visit) call.   Mliss Creed [facility], BSN RN Care Manager/ Transition of Care [place]/ [facility] [phone]

## 2023-02-10 NOTE — Addendum Note (Signed)
 Addended by: Bennie Pierini on: 02/10/2023 03:44 PM   Modules accepted: Orders

## 2023-02-10 NOTE — Patient Instructions (Signed)
Urinary Incontinence Urinary incontinence refers to a condition in which a person is unable to control where and when to pass urine. A person with this condition will urinate involuntarily. This means that the person urinates when he or she does not mean to. What are the causes? This condition may be caused by: Medicines. Infections. Constipation. Overactive bladder muscles. Weak bladder muscles. Weak pelvic floor muscles. These muscles provide support for the bladder, intestine, and, in women, the uterus. Enlarged prostate in men. The prostate is a gland near the bladder. When it gets too big, it can pinch the urethra. With the urethra blocked, the bladder can weaken and lose the ability to empty properly. Surgery. Emotional factors, such as anxiety, stress, or post-traumatic stress disorder (PTSD). Spinal cord injury, nerve injury, or other neurological conditions. Pelvic organ prolapse. This happens in women when organs move out of place and into the vagina. This movement can prevent the bladder and urethra from working properly. What increases the risk? The following factors may make you more likely to develop this condition: Age. The older you are, the higher the risk. Obesity. Being physically inactive. Pregnancy and childbirth. Menopause. Diseases that affect the nerves or spinal cord. Long-term, or chronic, coughing. This can increase pressure on the bladder and pelvic floor muscles. What are the signs or symptoms? Symptoms may vary depending on the type of urinary incontinence you have. They include: A sudden urge to urinate, and passing urine involuntarily before you can get to a bathroom (urge incontinence). Suddenly passing urine when doing activities that force urine to pass, such as coughing, laughing, exercising, or sneezing (stress incontinence). Needing to urinate often but urinating only a small amount, or constantly dribbling urine (overflow incontinence). Urinating  because you cannot get to the bathroom in time due to a physical disability, such as arthritis or injury, or due to a communication or thinking problem, such as Alzheimer's disease (functional incontinence). How is this diagnosed? This condition may be diagnosed based on: Your medical history. A physical exam. Tests, such as: Urine tests. X-rays of your kidney and bladder. Ultrasound. CT scan. Cystoscopy. In this procedure, a health care provider inserts a tube with a light and camera (cystoscope) through the urethra and into the bladder to check for problems. Urodynamic testing. These tests assess how well the bladder, urethra, and sphincter can store and release urine. There are different types of urodynamic tests, and they vary depending on what the test is measuring. To help diagnose your condition, your health care provider may recommend that you keep a log of when you urinate and how much you urinate. How is this treated? Treatment for this condition depends on the type of incontinence that you have and its cause. Treatment may include: Lifestyle changes, such as: Quitting smoking. Maintaining a healthy weight. Staying active. Try to get 150 minutes of moderate-intensity exercise every week. Ask your health care provider which activities are safe for you. Eating a healthy diet. Avoid high-fat foods, like fried foods. Avoid refined carbohydrates like white bread and white rice. Limit how much alcohol and caffeine you drink. Increase your fiber intake. Healthy sources of fiber include beans, whole grains, and fresh fruits and vegetables. Behavioral changes, such as: Pelvic floor muscle exercises. Bladder training, such as lengthening the amount of time between bathroom breaks, or using the bathroom at regular intervals. Using techniques to suppress bladder urges. This can include distraction techniques or controlled breathing exercises. Medicines, such as: Medicines to relax the  bladder   muscles and prevent bladder spasms. Medicines to help slow or prevent the growth of a man's prostate. Botox injections. These can help relax the bladder muscles. Treatments, such as: Using pulses of electricity to help change bladder reflexes (electrical nerve stimulation). For women, using a medical device to prevent urine leaks. This is a small, tampon-like, disposable device that is inserted into the urethra. Injecting collagen or carbon beads (bulking agents) into the urinary sphincter. These can help thicken tissue and close the bladder opening. Surgery. Follow these instructions at home: Lifestyle Limit alcohol and caffeine. These can fill your bladder quickly and irritate it. Keep yourself clean to help prevent odors and skin damage. Ask your health care provider about special skin creams and cleansers that can protect the skin from urine. Consider wearing pads or adult diapers. Make sure to change them regularly, and always change them right after experiencing incontinence. General instructions Take over-the-counter and prescription medicines only as told by your health care provider. Use the bathroom about every 3-4 hours, even if you do not feel the need to urinate. Try to empty your bladder completely every time. After urinating, wait a minute. Then try to urinate again. Make sure you are in a relaxed position while urinating. If your incontinence is caused by nerve problems, keep a log of the medicines you take and the times you go to the bathroom. Keep all follow-up visits. This is important. Where to find more information [employer] of Diabetes and Digestive and Kidney Diseases: CarFli[employer]tn American Urology Association: www.urologyhealth.org Contact a health care provider if: You have pain that gets worse. Your incontinence gets worse. Get help right away if: You have a fever or chills. You are unable to urinate. You have redness in your groin area or  down your legs. Summary Urinary incontinence refers to a condition in which a person is unable to control where and when to pass urine. This condition may be caused by medicines, infection, weak bladder muscles, weak pelvic floor muscles, enlargement of the prostate (in men), or surgery. Factors such as older age, obesity, pregnancy and childbirth, menopause, neurological diseases, and chronic coughing may increase your risk for developing this condition. Types of urinary incontinence include urge incontinence, stress incontinence, overflow incontinence, and functional incontinence. This condition is usually treated first with lifestyle and behavioral changes, such as quitting smoking, eating a healthier diet, and doing regular pelvic floor exercises. Other treatment options include medicines, bulking agents, medical devices, electrical nerve stimulation, or surgery. This information is not intended to replace advice given to you by your health care provider. Make sure you discuss any questions you have with your health care provider. Document Revised: 08/30/2019 Document Reviewed: 08/30/2019 Elsevier Patient Education  2024 [employer].

## 2023-02-10 NOTE — Addendum Note (Signed)
 Addended by: Bennie Pierini on: 02/10/2023 03:42 PM   Modules accepted: Orders

## 2023-02-11 LAB — BMP8+EGFR
BUN/Creatinine Ratio: 28 (ref 12–28)
BUN: 18 mg/dL (ref 8–27)
CO2: 24 mmol/L (ref 20–29)
Calcium: 8.8 mg/dL (ref 8.7–10.3)
Chloride: 103 mmol/L (ref 96–106)
Creatinine, Ser: 0.65 mg/dL (ref 0.57–1.00)
Glucose: 132 mg/dL — ABNORMAL HIGH (ref 70–99)
Potassium: 4.4 mmol/L (ref 3.5–5.2)
Sodium: 141 mmol/L (ref 134–144)
eGFR: 95 mL/min/{1.73_m2} (ref >59)

## 2023-02-11 LAB — CBC WITH DIFFERENTIAL/PLATELET
Basophils Absolute: 0.1 10*3/uL (ref 0.0–0.2)
Basos: 1 %
EOS (ABSOLUTE): 0.1 10*3/uL (ref 0.0–0.4)
Eos: 1 %
Hematocrit: 26.7 % — ABNORMAL LOW (ref 34.0–46.6)
Hemoglobin: 8.6 g/dL — CL (ref 11.1–15.9)
Immature Grans (Abs): 0.1 10*3/uL (ref 0.0–0.1)
Immature Granulocytes: 1 %
Lymphocytes Absolute: 1.6 10*3/uL (ref 0.7–3.1)
Lymphs: 18 %
MCH: 32 pg (ref 26.6–33.0)
MCHC: 32.2 g/dL (ref 31.5–35.7)
MCV: 99 fL — ABNORMAL HIGH (ref 79–97)
Monocytes Absolute: 0.7 10*3/uL (ref 0.1–0.9)
Monocytes: 8 %
Neutrophils Absolute: 6.3 10*3/uL (ref 1.4–7.0)
Neutrophils: 71 %
Platelets: 288 10*3/uL (ref 150–450)
RBC: 2.69 x10E6/uL — CL (ref 3.77–5.28)
RDW: 14.4 % (ref 11.7–15.4)
WBC: 8.8 10*3/uL (ref 3.4–10.8)

## 2023-02-13 ENCOUNTER — Telehealth: Payer: Self-pay | Admitting: *Deleted

## 2023-02-13 NOTE — Transitions of Care (Post Inpatient/ED Visit) (Signed)
   02/13/2023  Name: Yurani Fettes MRN: [id number] DOB: [birth year]  Today's TOC FU Call Status: Today's TOC FU Call Status:: Unsuccessful Call (3rd Attempt) Unsuccessful Call (3rd Attempt) Date: 02/13/23  Attempted to reach the patient regarding the most recent Inpatient/ED visit.  Follow Up Plan: No further outreach attempts will be made at this time. We have been unable to contact the patient.  Mliss Creed [facility], BSN RN Care Manager/ Transition of Care [place]/ [facility] [phone]

## 2023-02-17 ENCOUNTER — Encounter (HOSPITAL_COMMUNITY): Payer: Self-pay | Admitting: Gastroenterology

## 2023-02-27 ENCOUNTER — Other Ambulatory Visit: Payer: Self-pay | Admitting: Nurse Practitioner

## 2023-02-27 DIAGNOSIS — R600 Localized edema: Secondary | ICD-10-CM

## 2023-03-08 ENCOUNTER — Ambulatory Visit (INDEPENDENT_AMBULATORY_CARE_PROVIDER_SITE_OTHER): Payer: Medicare HMO | Admitting: Gastroenterology

## 2023-03-10 ENCOUNTER — Encounter: Payer: Self-pay | Admitting: Nurse Practitioner

## 2023-03-10 ENCOUNTER — Other Ambulatory Visit: Payer: Self-pay

## 2023-03-10 ENCOUNTER — Ambulatory Visit (INDEPENDENT_AMBULATORY_CARE_PROVIDER_SITE_OTHER): Payer: Medicare HMO | Admitting: Nurse Practitioner

## 2023-03-10 VITALS — BP 161/54 | HR 62 | Temp 98.1°F

## 2023-03-10 DIAGNOSIS — E119 Type 2 diabetes mellitus without complications: Secondary | ICD-10-CM

## 2023-03-10 DIAGNOSIS — Z7984 Long term (current) use of oral hypoglycemic drugs: Secondary | ICD-10-CM

## 2023-03-10 DIAGNOSIS — M5431 Sciatica, right side: Secondary | ICD-10-CM | POA: Diagnosis not present

## 2023-03-10 MED ORDER — KETOROLAC TROMETHAMINE 60 MG/2ML IM SOLN
60.0000 mg | Freq: Once | INTRAMUSCULAR | Status: AC
  Administered 2023-03-10: 60 mg via INTRAMUSCULAR

## 2023-03-10 MED ORDER — METHYLPREDNISOLONE ACETATE 80 MG/ML IJ SUSP
80.0000 mg | Freq: Once | INTRAMUSCULAR | Status: AC
  Administered 2023-03-10: 80 mg via INTRAMUSCULAR

## 2023-03-10 MED ORDER — METFORMIN HCL ER 500 MG PO TB24: 500.0000 mg | ORAL_TABLET | Freq: Every day | ORAL | 3 refills | Status: DC

## 2023-03-10 NOTE — Patient Instructions (Signed)
Sciatica  Sciatica is pain, numbness, weakness, or tingling along the path of the sciatic nerve. The sciatic nerve starts in the lower back and runs down the back of each leg. The nerve controls the muscles in the lower leg and in the back of the knee. It also provides feeling (sensation) to the back of the thigh, the lower leg, and the sole of the foot. Sciatica is a symptom of another medical condition that pinches or puts pressure on the sciatic nerve. Sciatica most often only affects one side of the body. Sciatica usually goes away on its own or with treatment. In some cases, sciatica may come back (recur). What are the causes? This condition is caused by pressure on the sciatic nerve or pinching of the nerve. This may be the result of: A disk in between the bones of the spine bulging out too far (herniated disk). Age-related changes in the spinal disks. A pain disorder that affects a muscle in the buttock. Extra bone growth near the sciatic nerve. A break (fracture) of the pelvis. Pregnancy. Tumor. This is rare. What increases the risk? The following factors may make you more likely to develop this condition: Playing sports that place pressure or stress on the spine. Having poor strength and flexibility. A history of back injury or surgery. Sitting for long periods of time. Doing activities that involve repetitive bending or lifting. Obesity. What are the signs or symptoms? Symptoms can vary from mild to very severe. They may include: Any of the following problems in the lower back, leg, hip, or buttock: Mild tingling, numbness, or dull aches. Burning sensations. Sharp pains. Numbness in the back of the calf or the sole of the foot. Leg weakness. Severe back pain that makes movement difficult. Symptoms may get worse when you cough, sneeze, or laugh, or when you sit or stand for long periods of time. How is this diagnosed? This condition may be diagnosed based on: Your symptoms  and medical history. A physical exam. Blood tests. Imaging tests, such as: X-rays. An MRI. A CT scan. How is this treated? In many cases, this condition improves on its own without treatment. However, treatment may include: Reducing or modifying physical activity. Exercising, including strengthening and stretching. Icing and applying heat to the affected area. Medicines that help to: Relieve pain and swelling. Relax your muscles. Injections of medicines that help to relieve pain and inflammation (steroids) around the sciatic nerve. Surgery. Follow these instructions at home: Medicines Take over-the-counter and prescription medicines only as told by your health care provider. Ask your health care provider if the medicine prescribed to you requires you to avoid driving or using heavy machinery. Managing pain     If directed, put ice on the affected area. To do this: Put ice in a plastic bag. Place a towel between your skin and the bag. Leave the ice on for 20 minutes, 2-3 times a day. If your skin turns bright red, remove the ice right away to prevent skin damage. The risk of skin damage is higher if you cannot feel pain, heat, or cold. If directed, apply heat to the affected area as often as told by your health care provider. Use the heat source that your health care provider recommends, such as a moist heat pack or a heating pad. Place a towel between your skin and the heat source. Leave the heat on for 20-30 minutes. If your skin turns bright red, remove the heat right away to prevent [place]. The   risk of Jansma is higher if you cannot feel pain, heat, or cold. Activity  Return to your normal activities as told by your health care provider. Ask your health care provider what activities are safe for you. Avoid activities that make your symptoms worse. Take brief periods of rest throughout the day. When you rest for longer periods, mix in some mild activity or stretching between  periods of rest. This will help to prevent stiffness and pain. Avoid sitting for long periods of time without moving. Get up and move around at least one time each hour. Exercise and stretch regularly as told by your health care provider. Do not lift anything that is heavier than 10 lb (4.5 kg) until your health care provider says that it is safe. When you do not have symptoms, you should still avoid heavy lifting, especially repetitive heavy lifting. When you lift objects, always use proper lifting technique, which includes: Bending your knees. Keeping the load close to your body. Avoiding twisting. General instructions Maintain a healthy weight. Excess weight puts extra stress on your back. Wear supportive, comfortable shoes. Avoid wearing high heels. Avoid sleeping on a mattress that is too soft or too hard. A mattress that is firm enough to support your back when you sleep may help to reduce your pain. Contact a health care provider if: Your pain is not controlled by medicine. Your pain does not improve or gets worse. Your pain lasts longer than 4 weeks. You have unexplained weight loss. Get help right away if: You are not able to control when you urinate or have bowel movements (incontinence). You have: Weakness in your lower back, pelvis, buttocks, or legs that gets worse. Redness or swelling of your back. A burning sensation when you urinate. Summary Sciatica is pain, numbness, weakness, or tingling along the path of the sciatic nerve, which may include the lower back, legs, hips, and buttocks. This condition is caused by pressure on the sciatic nerve or pinching of the nerve. Treatment often includes rest, exercise, medicines, and applying ice or heat. This information is not intended to replace advice given to you by your health care provider. Make sure you discuss any questions you have with your health care provider. Document Revised: 05/03/2021 Document Reviewed:  05/03/2021 Elsevier Patient Education  2024 Elsevier Inc.  

## 2023-03-10 NOTE — Progress Notes (Signed)
Subjective:    Patient ID: Ashley [name], female    DOB: 05/11/1950, 73 y.o.   MRN: [id number]   Chief Complaint: Discuss meds (Can't afford Januvia ) and Leg Pain (Right leg/)   Meds- she was on metformin for diabetes which was diarrhea. We stopped it and  put her on januvia. She did not get filled because it was expensive. So she has continued to take her metformin and her diarrhea has slowed down some.She has not been checking her blood sugars.  Leg Pain  Incident onset: 1 month ago. There was no injury mechanism. The pain is present in the right hip. The quality of the pain is described as burning. The pain is at a severity of 8/10. The pain is mild. The pain has been Fluctuating since onset. Pertinent negatives include no inability to bear weight, loss of motion or muscle weakness. She reports no foreign bodies present. The symptoms are aggravated by weight bearing. She has tried nothing for the symptoms. The treatment provided mild relief.    Patient Active Problem List   Diagnosis Date Noted   Acute GI bleeding 02/03/2023   Melena 02/03/2023   Peptic ulcer disease with hemorrhage 02/03/2023   GI bleed 02/02/2023   Adnexal cyst 02/02/2023   Aortic valve stenosis, mild 03/22/2022   Persistent atrial fibrillation (HCC)    Severe obesity (BMI >= 40) (HCC) 07/30/2014   Hyperlipidemia 05/31/2010   HTN (hypertension) 05/31/2010   Diabetes mellitus treated with oral medication (HCC) 05/31/2010   Peripheral edema 05/31/2010       Review of Systems  Constitutional:  Negative for diaphoresis.  Eyes:  Negative for pain.  Respiratory:  Negative for shortness of breath.   Cardiovascular:  Negative for chest pain, palpitations and leg swelling.  Gastrointestinal:  Negative for abdominal pain.  Endocrine: Negative for polydipsia.  Skin:  Negative for rash.  Neurological:  Negative for dizziness, weakness and headaches.  Hematological:  Does not bruise/bleed easily.  All other  systems reviewed and are negative.      Objective:   Physical Exam Vitals and nursing note reviewed.  Constitutional:      General: She is not in acute distress.    Appearance: Normal appearance. She is well-developed.  Neck:     Vascular: No carotid bruit or JVD.  Cardiovascular:     Rate and Rhythm: Normal rate and regular rhythm.     Heart sounds: Murmur (2/6) heard.  Pulmonary:     Effort: Pulmonary effort is normal. No respiratory distress.     Breath sounds: Normal breath sounds. No wheezing or rales.  Chest:     Chest wall: No tenderness.  Abdominal:     General: Bowel sounds are normal. There is no distension or abdominal bruit.     Palpations: Abdomen is soft. There is no hepatomegaly, splenomegaly, mass or pulsatile mass.     Tenderness: There is no abdominal tenderness.  Musculoskeletal:        General: Normal range of motion.     Cervical back: Normal range of motion and neck supple.     Comments: (-) SLR bil Gait slow and steady  Lymphadenopathy:     Cervical: No cervical adenopathy.  Skin:    General: Skin is warm and dry.  Neurological:     Mental Status: She is alert and oriented to person, place, and time.     Deep Tendon Reflexes: Reflexes are normal and symmetric.  Psychiatric:  Behavior: Behavior normal.        Thought Content: Thought content normal.        Judgment: Judgment normal.     BP (!) 161/54   Pulse 62   Temp 98.1 F (36.7 C) (Temporal)   SpO2 97%        Assessment & Plan:   Shamekia Tippets in today with chief complaint of Discuss meds (Can't afford Januvia ) and Leg Pain (Right leg/)   1. Sciatica of right side (Primary) Moist heat rest - methylPREDNISolone acetate (DEPO-MEDROL) injection 80 mg - ketorolac (TORADOL) injection 60 mg  2. Diabetes mellitus treated with oral medication (HCC) Change to extended release metformin Watch carbs in diet - metFORMIN (GLUCOPHAGE-XR) 500 MG 24 hr tablet; Take 1 tablet (500  mg total) by mouth daily with breakfast.  Dispense: 30 tablet; Refill: 3    The above assessment and management plan was discussed with the patient. The patient verbalized understanding of and has agreed to the management plan. Patient is aware to call the clinic if symptoms persist or worsen. Patient is aware when to return to the clinic for a follow-up visit. Patient educated on when it is appropriate to go to the emergency department.   Mary-Margaret [name], FNP

## 2023-03-11 ENCOUNTER — Other Ambulatory Visit: Payer: Self-pay | Admitting: Nurse Practitioner

## 2023-03-11 DIAGNOSIS — E1159 Type 2 diabetes mellitus with other circulatory complications: Secondary | ICD-10-CM

## 2023-03-12 ENCOUNTER — Other Ambulatory Visit: Payer: Self-pay | Admitting: Nurse Practitioner

## 2023-03-18 ENCOUNTER — Other Ambulatory Visit: Payer: Self-pay | Admitting: Nurse Practitioner

## 2023-03-18 DIAGNOSIS — F411 Generalized anxiety disorder: Secondary | ICD-10-CM

## 2023-03-22 ENCOUNTER — Other Ambulatory Visit: Payer: Self-pay | Admitting: *Deleted

## 2023-03-22 DIAGNOSIS — I4819 Other persistent atrial fibrillation: Secondary | ICD-10-CM

## 2023-03-22 MED ORDER — APIXABAN 5 MG PO TABS
5.0000 mg | ORAL_TABLET | Freq: Two times a day (BID) | ORAL | 0 refills | Status: DC
Start: 2023-03-22 — End: 2023-04-03

## 2023-03-22 NOTE — Telephone Encounter (Signed)
Copied from CRM [id number]. Topic: Clinical - Medication Question >> Mar 22, 2023  9:49 AM Patsy Lager T wrote: Reason for CRM: patient called with questions about her Metformin. Please f/u with patient

## 2023-03-24 ENCOUNTER — Other Ambulatory Visit: Payer: Self-pay | Admitting: Nurse Practitioner

## 2023-03-30 NOTE — Patient Instructions (Signed)
 Our records indicate that you are due for your annual mammogram/breast imaging. While there is no way to prevent breast cancer, early detection provides the best opportunity for curing it. For women over the age of 39, the American Cancer Society recommends a yearly clinical breast exam and a yearly mammogram. These practices have saved thousands of lives. We need your help to ensure that you are receiving optimal medical care. Please call the imaging location that has done you previous mammograms. Please remember to list [place] as your primary care. This helps make sure we receive a report and can update your chart.  Below is the contact information for several local breast imaging centers. You may call the location that works best for you, and they will be happy to assistance in making you an appointment. You do not need an order for a regular screening mammogram. However, if you are having any problems or concerns with you breast area, please let your primary care provider know, and appropriate orders will be placed. Please let our office know if you have any questions or concerns. Or if you need information for another imaging center not on this list or outside of the area. We are commented to working with you on your health care journey.   The mobile unit/bus ([facility]) - they come twice a month to our location.  These appointments can be made through our office or by call The [facility]  [facility]  [address] [place], [place] [address] Phone [phone]  [facility]  [address] [place], [place] [address] [phone]  [facility] (part of [facility])  [phone] [address], [place] [address] [phone]  [facility]  [address] [place] [place] [address] [phone]  [facility] - [facility]  [address] [place] [address] [phone]  [facility]ammography in [place]  [address] [place], [place] [address] [phone]  [facility] [address] [place], [place] [address] [phone]  [facility] [address] [place], [place] [address] [phone]  [facility] [place], [place] [address] [phone]

## 2023-04-03 ENCOUNTER — Ambulatory Visit (INDEPENDENT_AMBULATORY_CARE_PROVIDER_SITE_OTHER): Payer: Medicare HMO | Admitting: Nurse Practitioner

## 2023-04-03 ENCOUNTER — Ambulatory Visit: Payer: Medicare HMO | Admitting: Nurse Practitioner

## 2023-04-03 ENCOUNTER — Encounter: Payer: Self-pay | Admitting: Nurse Practitioner

## 2023-04-03 VITALS — BP 151/63 | HR 55 | Temp 97.9°F | Ht 62.0 in | Wt 252.0 lb

## 2023-04-03 DIAGNOSIS — I152 Hypertension secondary to endocrine disorders: Secondary | ICD-10-CM | POA: Diagnosis not present

## 2023-04-03 DIAGNOSIS — R600 Localized edema: Secondary | ICD-10-CM | POA: Diagnosis not present

## 2023-04-03 DIAGNOSIS — E119 Type 2 diabetes mellitus without complications: Secondary | ICD-10-CM | POA: Diagnosis not present

## 2023-04-03 DIAGNOSIS — E1159 Type 2 diabetes mellitus with other circulatory complications: Secondary | ICD-10-CM

## 2023-04-03 DIAGNOSIS — I4819 Other persistent atrial fibrillation: Secondary | ICD-10-CM | POA: Diagnosis not present

## 2023-04-03 DIAGNOSIS — Z7984 Long term (current) use of oral hypoglycemic drugs: Secondary | ICD-10-CM

## 2023-04-03 DIAGNOSIS — R3915 Urgency of urination: Secondary | ICD-10-CM | POA: Diagnosis not present

## 2023-04-03 DIAGNOSIS — I10 Essential (primary) hypertension: Secondary | ICD-10-CM | POA: Diagnosis not present

## 2023-04-03 DIAGNOSIS — F411 Generalized anxiety disorder: Secondary | ICD-10-CM | POA: Diagnosis not present

## 2023-04-03 DIAGNOSIS — E782 Mixed hyperlipidemia: Secondary | ICD-10-CM

## 2023-04-03 LAB — BAYER DCA HB A1C WAIVED: HB A1C (BAYER DCA - WAIVED): 6.7 % — ABNORMAL HIGH (ref 4.8–5.6)

## 2023-04-03 MED ORDER — CLONIDINE HCL 0.1 MG PO TABS: 0.1000 mg | ORAL_TABLET | Freq: Two times a day (BID) | ORAL | 1 refills | Status: DC

## 2023-04-03 MED ORDER — METOPROLOL TARTRATE 50 MG PO TABS: 50.0000 mg | ORAL_TABLET | Freq: Two times a day (BID) | ORAL | 1 refills | Status: DC

## 2023-04-03 MED ORDER — AMLODIPINE BESYLATE 5 MG PO TABS: 5.0000 mg | ORAL_TABLET | Freq: Every day | ORAL | 1 refills | Status: DC

## 2023-04-03 MED ORDER — APIXABAN 5 MG PO TABS: 5.0000 mg | ORAL_TABLET | Freq: Two times a day (BID) | ORAL | 1 refills | Status: DC

## 2023-04-03 MED ORDER — ALPRAZOLAM 0.25 MG PO TABS: 0.2500 mg | ORAL_TABLET | Freq: Every day | ORAL | 5 refills | Status: DC

## 2023-04-03 MED ORDER — SIMVASTATIN 40 MG PO TABS: 40.0000 mg | ORAL_TABLET | Freq: Every evening | ORAL | 1 refills | Status: DC

## 2023-04-03 MED ORDER — LISINOPRIL 40 MG PO TABS: 40.0000 mg | ORAL_TABLET | Freq: Every day | ORAL | 1 refills | Status: DC

## 2023-04-03 MED ORDER — FUROSEMIDE 20 MG PO TABS: 20.0000 mg | ORAL_TABLET | Freq: Every day | ORAL | 1 refills | Status: DC

## 2023-04-03 MED ORDER — TOLTERODINE TARTRATE ER 2 MG PO CP24
2.0000 mg | ORAL_CAPSULE | Freq: Every day | ORAL | 1 refills | Status: DC
Start: 2023-04-03 — End: 2023-07-27

## 2023-04-03 NOTE — Progress Notes (Signed)
 Subjective:    Patient ID: Ashley [name], female    DOB: 11-09-1950, 73 y.o.   MRN: [id number]   Chief Complaint: medical management of chronic issues     HPI:  Ashley [name] is a 73 y.o. who identifies as a female who was assigned female at birth.   Social history: Lives with: her granddaughter lives with her Work history: drives a school bus   Comes in today for follow up of the following chronic medical issues:  1. Primary hypertension No c/o chest pain, sob or headache. Does not check blood pressure at home. BP Readings from Last 3 Encounters:  03/10/23 (!) 161/54  02/10/23 (!) 152/69  02/07/23 129/63     2. Mixed hyperlipidemia Does not really watch diet and does no dedicated exercise. Lab Results  Component Value Date   CHOL 120 09/26/2022   HDL 44 09/26/2022   LDLCALC 48 09/26/2022   TRIG 166 (H) 09/26/2022   CHOLHDL 2.7 09/26/2022     3. Diabetes mellitus treated with oral medication (HCC) Does not check blood sugras at home. Nor does she watch her diet. We stopped her metformin at last visit and changed her to Januvia due to diarrhea from metformin. The [place] was to expensive so we put her on metformin ER 500mg  daily. She has not started that either she went back to mjetfoirmin 1000mg  BID. Lab Results  Component Value Date   HGBA1C 7.5 (H) 02/02/2023     4. Persistent atrial fibrillation (HCC) Is on eliquis daily- no bleeding issues  5. Peripheral edema Has edema at the end of each day- will resolve at night  6. Urinary urgency Started her on detrol LA at last visit and she says that that has helped.  7. Severe obesity (BMI >= 40) (HCC) No recent weight changes  Wt Readings from Last 3 Encounters:  04/03/23 252 lb (114.3 kg)  02/10/23 257 lb (116.6 kg)  02/03/23 264 lb (119.7 kg)   BMI Readings from Last 3 Encounters:  04/03/23 46.09 kg/m  02/10/23 47.01 kg/m  02/03/23 47.52 kg/m       New complaints: None  today  Allergies  Allergen Reactions   Cipro [Ciprofloxacin Hcl] Hives   Outpatient Encounter Medications as of 04/03/2023  Medication Sig   Accu-Chek Softclix Lancets lancets TEST BLOOD SUGAR TWICE DAILY Dx E11.9   acetaminophen (TYLENOL) 500 MG tablet Take 1,000 mg by mouth 2 (two) times daily as needed for moderate pain (pain score 4-6) or headache.   albuterol (VENTOLIN HFA) 108 (90 Base) MCG/ACT inhaler INHALE 2 PUFFS INTO THE LUNGS EVERY 6 HOURS AS NEEDED FOR WHEEZE OR SHORTNESS OF BREATH   Alcohol Swabs (DROPSAFE ALCOHOL PREP) 70 % PADS TEST BLOOD SUGAR TWICE DAILY Dx E11.9   ALPRAZolam (XANAX) 0.25 MG tablet TAKE 1 TABLET DAILY AS NEEDED FOR ANXIETY   amLODipine (NORVASC) 5 MG tablet TAKE 1 TABLET EVERY DAY   apixaban (ELIQUIS) 5 MG TABS tablet Take 1 tablet (5 mg total) by mouth 2 (two) times daily.   Ascorbic Acid (VITAMIN C PO) Take 1 tablet by mouth at bedtime.   Blood Glucose Monitoring Suppl (ACCU-CHEK GUIDE ME) w/Device KIT TEST BLOOD SUGAR TWICE DAILY Dx E11.9   Cholecalciferol (VITAMIN D-3 PO) Take 1 tablet by mouth at bedtime.   CINNAMON PO Take 1 capsule by mouth daily.    cloNIDine (CATAPRES) 0.1 MG tablet Take 1 tablet (0.1 mg total) by mouth 2 (two) times daily.   furosemide (  LASIX) 20 MG tablet TAKE 1 TABLET EVERY DAY   Ginger, Zingiber officinalis, (GINGER ROOT PO) Take 1 capsule by mouth daily.    glucose blood (ACCU-CHEK AVIVA PLUS) test strip TEST BLOOD SUGAR TWICE DAILY Dx E11.9   lisinopril (ZESTRIL) 40 MG tablet TAKE 1 TABLET EVERY DAY   metFORMIN (GLUCOPHAGE-XR) 500 MG 24 hr tablet Take 1 tablet (500 mg total) by mouth daily with breakfast.   metoprolol tartrate (LOPRESSOR) 50 MG tablet TAKE 1 TABLET TWICE DAILY   Multiple Vitamins-Minerals (ZINC PO) Take 1 tablet by mouth at bedtime.   Omega-3 Fatty Acids (FISH OIL PO) Take 1 capsule by mouth daily.   pantoprazole (PROTONIX) 40 MG tablet Take 1 tablet (40 mg total) by mouth 2 (two) times daily.    polyethylene glycol (MIRALAX / GLYCOLAX) 17 g packet Take 17 g by mouth daily as needed for mild constipation or moderate constipation.   simvastatin (ZOCOR) 40 MG tablet TAKE 1 TABLET EVERY EVENING   sitaGLIPtin (JANUVIA) 100 MG tablet Take 1 tablet (100 mg total) by mouth daily.   sucralfate (CARAFATE) 1 g tablet Take 1 tablet (1 g total) by mouth 4 (four) times daily.   tolterodine (DETROL LA) 2 MG 24 hr capsule TAKE 1 CAPSULE BY MOUTH DAILY.   TURMERIC CURCUMIN PO Take 1 capsule by mouth daily.   No facility-administered encounter medications on file as of 04/03/2023.    Past Surgical History:  Procedure Laterality Date   BIOPSY  02/03/2023   Procedure: BIOPSY;  Surgeon: Franky Macho, MD;  [facility];  Service: Endoscopy;;   CYST EXCISION Left 03/15/2017   from buttocks   ESOPHAGOGASTRODUODENOSCOPY (EGD) WITH PROPOFOL N/A 02/03/2023   Procedure: ESOPHAGOGASTRODUODENOSCOPY (EGD) WITH PROPOFOL;  Surgeon: Franky Macho, MD;  [facility];  Service: Endoscopy;  Laterality: N/A;   ORIF ANKLE FRACTURE Left 04/20/2017   Procedure: OPEN REDUCTION INTERNAL FIXATION (ORIF) LEFT ANKLE FRACTURE;  Surgeon: Beverely Low, MD;  Location: [facility] OR;  Service: Orthopedics;  Laterality: Left;   SCLEROTHERAPY  02/03/2023   Procedure: SCLEROTHERAPY;  Surgeon: Franky Macho, MD;  [facility];  Service: Endoscopy;;    Family History  Problem Relation Age of Onset   Diabetes Mother    CAD Mother        CABG at age 31   Hypertension Mother    Hyperlipidemia Mother    Diabetes Father    Heart disease Father        CHF   Hypertension Father    Diabetes Sister    CAD Sister    Hypertension Sister    Diabetes Brother    Hypertension Brother    Healthy Sister       Controlled substance contract: n/a     Review of Systems  Constitutional:  Negative for diaphoresis.  Eyes:  Negative for pain.  Respiratory:  Negative for shortness of breath.    Cardiovascular:  Negative for chest pain, palpitations and leg swelling.  Gastrointestinal:  Negative for abdominal pain.  Endocrine: Negative for polydipsia.  Skin:  Negative for rash.  Neurological:  Negative for dizziness, weakness and headaches.  Hematological:  Does not bruise/bleed easily.  All other systems reviewed and are negative.      Objective:   Physical Exam Vitals and nursing note reviewed.  Constitutional:      General: She is not in acute distress.    Appearance: Normal appearance. She is well-developed.  HENT:  Head: Normocephalic.     Right Ear: Tympanic membrane normal.     Left Ear: Tympanic membrane normal.     Nose: Nose normal.     Mouth/Throat:     Mouth: Mucous membranes are moist.  Eyes:     Pupils: Pupils are equal, round, and reactive to light.  Neck:     Vascular: No carotid bruit or JVD.  Cardiovascular:     Rate and Rhythm: Normal rate. Rhythm irregular.     Heart sounds: Murmur (3/6 systolic) heard.  Pulmonary:     Effort: Pulmonary effort is normal. No respiratory distress.     Breath sounds: Normal breath sounds. No wheezing or rales.  Chest:     Chest wall: No tenderness.  Abdominal:     General: Bowel sounds are normal. There is no distension or abdominal bruit.     Palpations: Abdomen is soft. There is no hepatomegaly, splenomegaly, mass or pulsatile mass.     Tenderness: There is no abdominal tenderness.  Musculoskeletal:        General: Normal range of motion.     Cervical back: Normal range of motion and neck supple.  Lymphadenopathy:     Cervical: No cervical adenopathy.  Skin:    General: Skin is warm and dry.  Neurological:     Mental Status: She is alert and oriented to person, place, and time.     Deep Tendon Reflexes: Reflexes are normal and symmetric.  Psychiatric:        Behavior: Behavior normal.        Thought Content: Thought content normal.        Judgment: Judgment normal.    BP (!) 151/63   Pulse (!)  55   Temp 97.9 F (36.6 C) (Temporal)   Ht 5\' 2"  (1.575 m)   Wt 252 lb (114.3 kg)   SpO2 97%   BMI 46.09 kg/m     HGBa1c 6.7%     Assessment & Plan:   Sheyna Pettibone comes in today with chief complaint of Medical Management of Chronic Issues   Diagnosis and orders addressed:  1. Diabetes mellitus treated with oral medication (HCC) (Primary) Start on glucaphage 500XR when runs out of metformin Watch carbs in diet - Bayer DCA Hb A1c Waived  2. Primary hypertension Low sodium diet - CBC with Differential/Platelet - CMP14+EGFR  3. Mixed hyperlipidemia Low fat diet - Lipid panel - simvastatin (ZOCOR) 40 MG tablet; Take 1 tablet (40 mg total) by mouth every evening.  Dispense: 90 tablet; Refill: 1  4. GAD (generalized anxiety disorder) Stress mangagement - ALPRAZolam (XANAX) 0.25 MG tablet; Take 1 tablet (0.25 mg total) by mouth at bedtime.  Dispense: 30 tablet; Refill: 5  5. Hypertension associated with diabetes (HCC) Low sodium diet - amLODipine (NORVASC) 5 MG tablet; Take 1 tablet (5 mg total) by mouth daily.  Dispense: 90 tablet; Refill: 1 - cloNIDine (CATAPRES) 0.1 MG tablet; Take 1 tablet (0.1 mg total) by mouth 2 (two) times daily.  Dispense: 60 tablet; Refill: 1 - lisinopril (ZESTRIL) 40 MG tablet; Take 1 tablet (40 mg total) by mouth daily.  Dispense: 90 tablet; Refill: 1  6. Peripheral edema Elevate legs when sitting - furosemide (LASIX) 20 MG tablet; Take 1 tablet (20 mg total) by mouth daily.  Dispense: 90 tablet; Refill: 1  7. Persistent atrial fibrillation (HCC) Avoid caffeine - apixaban (ELIQUIS) 5 MG TABS tablet; Take 1 tablet (5 mg total) by mouth 2 (two) times daily.  Dispense: 180 tablet; Refill: 1 - metoprolol tartrate (LOPRESSOR) 50 MG tablet; Take 1 tablet (50 mg total) by mouth 2 (two) times daily.  Dispense: 180 tablet; Refill: 1  8. Urinary urgency - tolterodine (DETROL LA) 2 MG 24 hr capsule; Take 1 capsule (2 mg total) by mouth daily.   Dispense: 90 capsule; Refill: 1  9. Severe obesity Discussed diet and exercise for person with BMI >25 Will recheck weight in 3-6 months  Labs pending Health Maintenance reviewed Diet and exercise encouraged  Follow up plan: 6 months   Mary-Margaret [name], FNP

## 2023-04-04 LAB — LIPID PANEL
Chol/HDL Ratio: 2.6 {ratio} (ref 0.0–4.4)
Cholesterol, Total: 143 mg/dL (ref 100–199)
HDL: 56 mg/dL (ref >39)
LDL Chol Calc (NIH): 71 mg/dL (ref 0–99)
Triglycerides: 84 mg/dL (ref 0–149)
VLDL Cholesterol Cal: 16 mg/dL (ref 5–40)

## 2023-04-04 LAB — CBC WITH DIFFERENTIAL/PLATELET
Basophils Absolute: 0 10*3/uL (ref 0.0–0.2)
Basos: 1 %
EOS (ABSOLUTE): 0.1 10*3/uL (ref 0.0–0.4)
Eos: 2 %
Hematocrit: 36.4 % (ref 34.0–46.6)
Hemoglobin: 10.7 g/dL — ABNORMAL LOW (ref 11.1–15.9)
Immature Grans (Abs): 0 10*3/uL (ref 0.0–0.1)
Immature Granulocytes: 0 %
Lymphocytes Absolute: 1.5 10*3/uL (ref 0.7–3.1)
Lymphs: 18 %
MCH: 25.1 pg — ABNORMAL LOW (ref 26.6–33.0)
MCHC: 29.4 g/dL — ABNORMAL LOW (ref 31.5–35.7)
MCV: 85 fL (ref 79–97)
Monocytes Absolute: 0.6 10*3/uL (ref 0.1–0.9)
Monocytes: 7 %
Neutrophils Absolute: 6.1 10*3/uL (ref 1.4–7.0)
Neutrophils: 72 %
Platelets: 292 10*3/uL (ref 150–450)
RBC: 4.26 x10E6/uL (ref 3.77–5.28)
RDW: 16.6 % — ABNORMAL HIGH (ref 11.7–15.4)
WBC: 8.5 10*3/uL (ref 3.4–10.8)

## 2023-04-04 LAB — CMP14+EGFR
ALT: 13 [IU]/L (ref 0–32)
AST: 18 [IU]/L (ref 0–40)
Albumin: 3.8 g/dL — ABNORMAL LOW (ref 3.9–4.9)
Alkaline Phosphatase: 61 [IU]/L (ref 44–121)
BUN/Creatinine Ratio: 40 — ABNORMAL HIGH (ref 12–28)
BUN: 23 mg/dL (ref 8–27)
Bilirubin Total: 0.3 mg/dL (ref 0.0–1.2)
CO2: 23 mmol/L (ref 20–29)
Calcium: 9.4 mg/dL (ref 8.7–10.3)
Chloride: 101 mmol/L (ref 96–106)
Creatinine, Ser: 0.57 mg/dL (ref 0.57–1.00)
Globulin, Total: 3 g/dL (ref 1.5–4.5)
Glucose: 146 mg/dL — ABNORMAL HIGH (ref 70–99)
Potassium: 4.4 mmol/L (ref 3.5–5.2)
Sodium: 141 mmol/L (ref 134–144)
Total Protein: 6.8 g/dL (ref 6.0–8.5)
eGFR: 98 mL/min/{1.73_m2} (ref >59)

## 2023-04-07 ENCOUNTER — Other Ambulatory Visit: Payer: Self-pay

## 2023-04-07 DIAGNOSIS — D649 Anemia, unspecified: Secondary | ICD-10-CM

## 2023-04-26 ENCOUNTER — Ambulatory Visit (INDEPENDENT_AMBULATORY_CARE_PROVIDER_SITE_OTHER): Payer: Medicare HMO | Admitting: Gastroenterology

## 2023-04-26 ENCOUNTER — Telehealth (INDEPENDENT_AMBULATORY_CARE_PROVIDER_SITE_OTHER): Payer: Self-pay | Admitting: Gastroenterology

## 2023-04-26 VITALS — BP 160/76 | HR 71 | Temp 97.6°F | Ht 62.5 in | Wt 262.2 lb

## 2023-04-26 DIAGNOSIS — K279 Peptic ulcer, site unspecified, unspecified as acute or chronic, without hemorrhage or perforation: Secondary | ICD-10-CM | POA: Diagnosis not present

## 2023-04-26 DIAGNOSIS — K7460 Unspecified cirrhosis of liver: Secondary | ICD-10-CM | POA: Diagnosis not present

## 2023-04-26 DIAGNOSIS — R748 Abnormal levels of other serum enzymes: Secondary | ICD-10-CM | POA: Diagnosis not present

## 2023-04-26 DIAGNOSIS — Z1211 Encounter for screening for malignant neoplasm of colon: Secondary | ICD-10-CM | POA: Insufficient documentation

## 2023-04-26 DIAGNOSIS — K922 Gastrointestinal hemorrhage, unspecified: Secondary | ICD-10-CM | POA: Diagnosis not present

## 2023-04-26 DIAGNOSIS — K760 Fatty (change of) liver, not elsewhere classified: Secondary | ICD-10-CM | POA: Insufficient documentation

## 2023-04-26 NOTE — Telephone Encounter (Signed)
    04/26/23  Ashley [name] 1950-02-28  What type of surgery is being performed? EGD  When is surgery scheduled? 05/25/23  CLEARANCE TO HOLD ELIQUIS FOR 2 DAYS PRIOR TO EGD  Name of physician performing surgery?  Dr. Starleen Arms Gastroenterology at [facility] Phone: [phone] Fax: [phone]  Anethesia type (none, local, MAC, general)? Choice

## 2023-04-26 NOTE — Progress Notes (Signed)
 Vista Lawman , M.D. Gastroenterology & Hepatology [facility] Gastroenterology [address] [place], [place] [address] Primary Care Physician: Bennie Pierini, FNP [address] [place] [address]  Chief Complaint: Peptic ulcer disease with history GI bleed, new diagnosis of cirrhosis, screening colonoscopy  History of Present Illness: This is a 73 y.o. female with diabetes hypertension A-fib on Eliquis, AV stenosis , history of PUD with GI bleed (01/2023) underwent EGD found to have Forrest IIC duodenal ulcer is here for first clinic visit for follow up on PUD , new diagnosis of cirrhosis and colon cancer screening   This is patient's first GI clinic appointment and is here with her daughter .  Patient reports her stools are now brown in color never black.  Previously she was taking celecoxib every day that she have stopped and not taking Tylenol only . The patient denies having any nausea, vomiting, fever, chills, hematochezia, melena, hematemesis, abdominal distention, abdominal pain, diarrhea, jaundice, pruritus or weight loss.  Patient is a lifelong nonalcohol user, denies any herbal medications or family history of liver issues.  Never been told of cirrhosis previously  Last EGD:01/2023  No gross lesions at the esophagus - Erythematous mucosa in the antrum. Biopsied. - Non- bleeding duodenal ulcer with a flat pigmented spot ( Forrest Class IIc) . Injected.   A. STOMACH, BIOPSY:  Gastric antral and oxyntic mucosa with slight chronic inflammation.  Negative for Helicobacter pylori.   Last Colonoscopy:none  FHx: neg for any gastrointestinal/liver disease, no malignancies Social: neg smoking, alcohol or illicit drug use Surgical: no abdominal surgeries  Past Medical History: Past Medical History:  Diagnosis Date   A-fib (HCC)    Depression    Diabetes mellitus without complication (HCC)    Heart disease    Hyperlipidemia     Hypertension    Hypertension     Past Surgical History: Past Surgical History:  Procedure Laterality Date   BIOPSY  02/03/2023   Procedure: BIOPSY;  Surgeon: Franky Macho, MD;  [facility];  Service: Endoscopy;;   CYST EXCISION Left 03/15/2017   from buttocks   ESOPHAGOGASTRODUODENOSCOPY (EGD) WITH PROPOFOL N/A 02/03/2023   Procedure: ESOPHAGOGASTRODUODENOSCOPY (EGD) WITH PROPOFOL;  Surgeon: Franky Macho, MD;  [facility];  Service: Endoscopy;  Laterality: N/A;   ORIF ANKLE FRACTURE Left 04/20/2017   Procedure: OPEN REDUCTION INTERNAL FIXATION (ORIF) LEFT ANKLE FRACTURE;  Surgeon: Beverely Low, MD;  Location: [facility] OR;  Service: Orthopedics;  Laterality: Left;   SCLEROTHERAPY  02/03/2023   Procedure: SCLEROTHERAPY;  Surgeon: Franky Macho, MD;  [facility];  Service: Endoscopy;;    Family History: Family History  Problem Relation Age of Onset   Diabetes Mother    CAD Mother        CABG at age 71   Hypertension Mother    Hyperlipidemia Mother    Diabetes Father    Heart disease Father        CHF   Hypertension Father    Diabetes Sister    CAD Sister    Hypertension Sister    Diabetes Brother    Hypertension Brother    Healthy Sister     Social History: Social History   Tobacco Use  Smoking Status Never  Smokeless Tobacco Never   Social History   Substance and Sexual Activity  Alcohol Use No   Social History   Substance and Sexual Activity  Drug Use No  Allergies: Allergies  Allergen Reactions   Cipro [Ciprofloxacin Hcl] Hives    Medications: Current Outpatient Medications  Medication Sig Dispense Refill   Accu-Chek Softclix Lancets lancets TEST BLOOD SUGAR TWICE DAILY Dx E11.9 200 each 3   acetaminophen (TYLENOL) 500 MG tablet Take 1,000 mg by mouth 2 (two) times daily as needed for moderate pain (pain score 4-6) or headache (sometimes 3 times a day).     albuterol (VENTOLIN HFA) 108 (90 Base)  MCG/ACT inhaler INHALE 2 PUFFS INTO THE LUNGS EVERY 6 HOURS AS NEEDED FOR WHEEZE OR SHORTNESS OF BREATH 1 each 2   Alcohol Swabs (DROPSAFE ALCOHOL PREP) 70 % PADS TEST BLOOD SUGAR TWICE DAILY Dx E11.9 200 each 3   ALPRAZolam (XANAX) 0.25 MG tablet Take 1 tablet (0.25 mg total) by mouth at bedtime. 30 tablet 5   amLODipine (NORVASC) 5 MG tablet Take 1 tablet (5 mg total) by mouth daily. 90 tablet 1   apixaban (ELIQUIS) 5 MG TABS tablet Take 1 tablet (5 mg total) by mouth 2 (two) times daily. 180 tablet 1   Ascorbic Acid (VITAMIN C PO) Take 1 tablet by mouth at bedtime.     Blood Glucose Monitoring Suppl (ACCU-CHEK GUIDE ME) w/Device KIT TEST BLOOD SUGAR TWICE DAILY Dx E11.9 1 kit 0   Cholecalciferol (VITAMIN D-3 PO) Take 1 tablet by mouth at bedtime.     CINNAMON PO Take 1 capsule by mouth daily.      cloNIDine (CATAPRES) 0.1 MG tablet Take 1 tablet (0.1 mg total) by mouth 2 (two) times daily. 60 tablet 1   furosemide (LASIX) 20 MG tablet Take 1 tablet (20 mg total) by mouth daily. 90 tablet 1   Ginger, Zingiber officinalis, (GINGER ROOT PO) Take 1 capsule by mouth daily.      glucose blood (ACCU-CHEK AVIVA PLUS) test strip TEST BLOOD SUGAR TWICE DAILY Dx E11.9 200 strip 3   lisinopril (ZESTRIL) 40 MG tablet Take 1 tablet (40 mg total) by mouth daily. 90 tablet 1   metFORMIN (GLUCOPHAGE-XR) 500 MG 24 hr tablet Take 1 tablet (500 mg total) by mouth daily with breakfast. 90 tablet 3   metoprolol tartrate (LOPRESSOR) 50 MG tablet Take 1 tablet (50 mg total) by mouth 2 (two) times daily. 180 tablet 1   Multiple Vitamins-Minerals (ZINC PO) Take 1 tablet by mouth at bedtime.     Omega-3 Fatty Acids (FISH OIL PO) Take 1 capsule by mouth daily.     pantoprazole (PROTONIX) 40 MG tablet Take 1 tablet (40 mg total) by mouth 2 (two) times daily. 60 tablet 2   polyethylene glycol (MIRALAX / GLYCOLAX) 17 g packet Take 17 g by mouth daily as needed for mild constipation or moderate constipation.     simvastatin  (ZOCOR) 40 MG tablet Take 1 tablet (40 mg total) by mouth every evening. 90 tablet 1   tolterodine (DETROL LA) 2 MG 24 hr capsule Take 1 capsule (2 mg total) by mouth daily. 90 capsule 1   TURMERIC CURCUMIN PO Take 1 capsule by mouth daily.     sucralfate (CARAFATE) 1 g tablet Take 1 tablet (1 g total) by mouth 4 (four) times daily. (Patient not taking: Reported on 04/26/2023) 120 tablet 1   No current facility-administered medications for this visit.    Review of Systems: GENERAL: negative for malaise, night sweats HEENT: No changes in hearing or vision, no nose bleeds or other nasal problems. NECK: Negative for lumps, goiter, pain and significant neck swelling  RESPIRATORY: Negative for cough, wheezing CARDIOVASCULAR: Negative for chest pain, leg swelling, palpitations, orthopnea GI: SEE HPI MUSCULOSKELETAL: Negative for joint pain or swelling, back pain, and muscle pain. SKIN: Negative for lesions, rash HEMATOLOGY Negative for prolonged bleeding, bruising easily, and swollen nodes. ENDOCRINE: Negative for cold or heat intolerance, polyuria, polydipsia and goiter. NEURO: negative for tremor, gait imbalance, syncope and seizures. The remainder of the review of systems is noncontributory.   Physical Exam: BP (!) 160/76 (BP Location: Left Arm, Patient Position: Sitting, Cuff Size: Large)   Pulse 71   Temp 97.6 F (36.4 C) (Oral)   Ht 5' 2.5" (1.588 m)   Wt 262 lb 3.2 oz (118.9 kg)   BMI 47.19 kg/m  GENERAL: The patient is AO x3, in no acute distress. HEENT: Head is normocephalic and atraumatic. EOMI are intact. Mouth is well hydrated and without lesions. NECK: Supple. No masses LUNGS: Clear to auscultation. No presence of rhonchi/wheezing/rales. Adequate chest expansion HEART: RRR, normal s1 and s2. ABDOMEN: Soft, nontender, no guarding, no peritoneal signs, and nondistended. BS +. No masses.  Imaging/Labs: as above  01/2023  IMPRESSION: 1. No acute findings. 2. Bilateral  nephrolithiasis. 3. 5 cm right adnexal cyst. Recommend follow-up US in 6-12 months. Note: This recommendation does not apply to premenarchal patients and to those with increased risk (genetic, family history, elevated tumor markers or other high-risk factors) of ovarian cancer. Reference: JACR 2020 Feb; 17(2):248-254 4. Mildly nodular hepatic contour, suggesting cirrhosis. 5.  Aortic Atherosclerosis (ICD10-I70.0).      Latest Ref Rng & Units 04/03/2023   11:08 AM 02/10/2023   12:43 PM 02/07/2023    5:14 AM  CBC  WBC 3.4 - 10.8 x10E3/uL 8.5  8.8  7.5   Hemoglobin 11.1 - 15.9 g/dL 28.4  8.6  7.9   Hematocrit 34.0 - 46.6 % 36.4  26.7  24.9   Platelets 150 - 450 x10E3/uL 292  288  204    No results found for: "IRON", "TIBC", "FERRITIN"  I personally reviewed and interpreted the available labs, imaging and endoscopic files.  Impression and Plan: This is a 73 y.o. female with diabetes hypertension A-fib on Eliquis, AV stenosis , history of PUD with GI bleed (01/2023) underwent EGD found to have Forrest IIC duodenal ulcer is here for first clinic visit for follow up on PUD , new diagnosis of cirrhosis and colon cancer screening   #History of PUD with GI Bleed  Patient underwent upper endoscopy 02/03/2023 found to have Forrest 2C duodenal bulb ulcer status post epi injection.   -Will repeat EGD  to assess healing of ulcer given large size.  This with patient risks benefit and limitations   -Avoid all NSAIDs and take Tylenol only   #Compensated Cirrhosis  MELD cannot be calculated given no INR on file CT demonstrating nodular liver.  #Eitology : Likely MASLD with BMI of 47 and metabolic syndrome with diabetes and hypertension  # Hepatic encephalopathy None - Avoid opiates or benzodiazepines   # Ascites; none on imaging  - Low sodium diet  # Esophageal varices - Last EGD 01/2023, none   # HCC screening - Last abdominal imaging; 12 /2024 -Patient to be enrolled in [facility]  screening protocol with right upper quadrant ultrasound and AFP every 6 months  Recommendation   - Check CBC, MELD labs and AFP - Schedule liver US in 6 months - Reduce salt intake to <2 g per day - Can take Tylenol max of 2 g per day (  650 mg q8h) for pain - Avoid NSAIDs for pain - Avoid eating raw oysters/shellfish - will obtain baseline viral hepatitis profile and autoimmune serologies   #Colon cancer screening The patient was counseled regarding the importance of colorectal cancer screening, particularly she never had any sort of screening prior . the benefits of screening include early detection of colorectal cancer and precancerous polyps, which can improve treatment outcomes and reduce mortality. Risks associated with screening, particularly colonoscopy, include potential complications such as bleeding and perforation. After deciding different modalities for screening for colon cancer , patient has opted to pursue  Cologuard as two step screening (patient has a kit at home and will complete testing).  If this is positive patient will reach out to [place] and we may add colonoscopy with upper endoscopy at the same time  #Right adnexal cyst  Last CT with 5 cm right adnexal cyst.  Patient to follow-up with PCP for surveillance and further management of the cyst  All questions were answered.      Vista Lawman, MD Gastroenterology and Hepatology [facility] Gastroenterology   This chart has been completed using [facility] Dictation software, and while attempts have been made to ensure accuracy , certain words and phrases may not be transcribed as intended

## 2023-04-26 NOTE — Patient Instructions (Signed)
 It was very nice to meet you today, as dicussed with will plan for the following : 1)  Labs and ultrasound  2) Upper EGD

## 2023-04-27 ENCOUNTER — Other Ambulatory Visit

## 2023-04-27 ENCOUNTER — Encounter: Payer: Self-pay | Admitting: Internal Medicine

## 2023-04-27 ENCOUNTER — Ambulatory Visit: Payer: Medicare HMO | Attending: Internal Medicine | Admitting: Internal Medicine

## 2023-04-27 ENCOUNTER — Encounter (INDEPENDENT_AMBULATORY_CARE_PROVIDER_SITE_OTHER): Payer: Self-pay

## 2023-04-27 VITALS — BP 132/68 | HR 53 | Ht 63.0 in | Wt 260.8 lb

## 2023-04-27 DIAGNOSIS — E8770 Fluid overload, unspecified: Secondary | ICD-10-CM | POA: Diagnosis not present

## 2023-04-27 DIAGNOSIS — I4819 Other persistent atrial fibrillation: Secondary | ICD-10-CM

## 2023-04-27 DIAGNOSIS — R600 Localized edema: Secondary | ICD-10-CM | POA: Diagnosis not present

## 2023-04-27 DIAGNOSIS — R0609 Other forms of dyspnea: Secondary | ICD-10-CM | POA: Insufficient documentation

## 2023-04-27 NOTE — Patient Instructions (Signed)
 Medication Instructions:  Your physician recommends that you continue on your current medications as directed. Please refer to the Current Medication list given to you today.   Labwork: BNP to be completed at [facility] in [place]  Testing/Procedures: None  Follow-Up: Your physician recommends that you schedule a follow-up appointment in: 1 year. You will receive a reminder call in about 8 months reminding you to schedule your appointment. If you don't receive this call, please contact our office.   Any Other Special Instructions Will Be Listed Below (If Applicable). Thank you for choosing [facility]!     If you need a refill on your cardiac medications before your next appointment, please call your pharmacy.

## 2023-04-27 NOTE — Progress Notes (Signed)
 Cardiology Office Note  Date: 04/27/2023   ID: Violette, Morneault May 31, 1950, MRN [id number]  PCP:  Bennie Pierini, FNP  Cardiologist:  Marjo Bicker, MD Electrophysiologist:  None   Reason for Office Visit: Follow-up of A-fib, aortic valve stenosis   History of Present Illness: Ashley [name] is a 73 y.o. female known to have A-fib, HTN, DM 2, HLD presented to cardiology clinic for follow-up visit.  Patient was initially referred to cardiology today for evaluation of chest pain that occurred when she had a chest cold.  No recurrence of chest pain since then. She does have chronic stable DOE with minimal leg swelling.  No dizziness, presyncope, syncope. EKG today showed atrial fibrillation with slow ventricular spots, HR 50s.  Past Medical History:  Diagnosis Date   A-fib [facility])    Depression    Diabetes mellitus without complication (HCC)    Heart disease    Hyperlipidemia    Hypertension    Hypertension     Past Surgical History:  Procedure Laterality Date   BIOPSY  02/03/2023   Procedure: BIOPSY;  Surgeon: Franky Macho, MD;  [facility];  Service: Endoscopy;;   CYST EXCISION Left 03/15/2017   from buttocks   ESOPHAGOGASTRODUODENOSCOPY (EGD) WITH PROPOFOL N/A 02/03/2023   Procedure: ESOPHAGOGASTRODUODENOSCOPY (EGD) WITH PROPOFOL;  Surgeon: Franky Macho, MD;  [facility];  Service: Endoscopy;  Laterality: N/A;   ORIF ANKLE FRACTURE Left 04/20/2017   Procedure: OPEN REDUCTION INTERNAL FIXATION (ORIF) LEFT ANKLE FRACTURE;  Surgeon: Beverely Low, MD;  Location: [facility] OR;  Service: Orthopedics;  Laterality: Left;   SCLEROTHERAPY  02/03/2023   Procedure: SCLEROTHERAPY;  Surgeon: Franky Macho, MD;  [facility]: [facility];  Service: Endoscopy;;    Current Outpatient Medications  Medication Sig Dispense Refill   Accu-Chek Softclix Lancets lancets TEST BLOOD SUGAR TWICE DAILY Dx E11.9 200 each 3   acetaminophen  (TYLENOL) 500 MG tablet Take 1,000 mg by mouth 2 (two) times daily as needed for moderate pain (pain score 4-6) or headache (sometimes 3 times a day).     albuterol (VENTOLIN HFA) 108 (90 Base) MCG/ACT inhaler INHALE 2 PUFFS INTO THE LUNGS EVERY 6 HOURS AS NEEDED FOR WHEEZE OR SHORTNESS OF BREATH 1 each 2   Alcohol Swabs (DROPSAFE ALCOHOL PREP) 70 % PADS TEST BLOOD SUGAR TWICE DAILY Dx E11.9 200 each 3   ALPRAZolam (XANAX) 0.25 MG tablet Take 1 tablet (0.25 mg total) by mouth at bedtime. 30 tablet 5   amLODipine (NORVASC) 5 MG tablet Take 1 tablet (5 mg total) by mouth daily. 90 tablet 1   apixaban (ELIQUIS) 5 MG TABS tablet Take 1 tablet (5 mg total) by mouth 2 (two) times daily. 180 tablet 1   Ascorbic Acid (VITAMIN C PO) Take 1 tablet by mouth at bedtime.     Blood Glucose Monitoring Suppl (ACCU-CHEK GUIDE ME) w/Device KIT TEST BLOOD SUGAR TWICE DAILY Dx E11.9 1 kit 0   Cholecalciferol (VITAMIN D-3 PO) Take 1 tablet by mouth at bedtime.     CINNAMON PO Take 1 capsule by mouth daily.      cloNIDine (CATAPRES) 0.1 MG tablet Take 1 tablet (0.1 mg total) by mouth 2 (two) times daily. 60 tablet 1   furosemide (LASIX) 20 MG tablet Take 1 tablet (20 mg total) by mouth daily. 90 tablet 1   Ginger, Zingiber officinalis, (GINGER ROOT PO) Take 1 capsule by mouth daily.  glucose blood (ACCU-CHEK AVIVA PLUS) test strip TEST BLOOD SUGAR TWICE DAILY Dx E11.9 200 strip 3   lisinopril (ZESTRIL) 40 MG tablet Take 1 tablet (40 mg total) by mouth daily. 90 tablet 1   metFORMIN (GLUCOPHAGE-XR) 500 MG 24 hr tablet Take 1 tablet (500 mg total) by mouth daily with breakfast. 90 tablet 3   metoprolol tartrate (LOPRESSOR) 50 MG tablet Take 1 tablet (50 mg total) by mouth 2 (two) times daily. 180 tablet 1   Multiple Vitamins-Minerals (ZINC PO) Take 1 tablet by mouth at bedtime.     Omega-3 Fatty Acids (FISH OIL PO) Take 1 capsule by mouth daily.     pantoprazole (PROTONIX) 40 MG tablet Take 1 tablet (40 mg total) by  mouth 2 (two) times daily. 60 tablet 2   polyethylene glycol (MIRALAX / GLYCOLAX) 17 g packet Take 17 g by mouth daily as needed for mild constipation or moderate constipation.     simvastatin (ZOCOR) 40 MG tablet Take 1 tablet (40 mg total) by mouth every evening. 90 tablet 1   sucralfate (CARAFATE) 1 g tablet Take 1 tablet (1 g total) by mouth 4 (four) times daily. 120 tablet 1   tolterodine (DETROL LA) 2 MG 24 hr capsule Take 1 capsule (2 mg total) by mouth daily. 90 capsule 1   TURMERIC CURCUMIN PO Take 1 capsule by mouth daily.     No current facility-administered medications for this visit.   Allergies:  Cipro [ciprofloxacin hcl]   Social History: The patient  reports that she has never smoked. She has never used smokeless tobacco. She reports that she does not drink alcohol and does not use drugs.   Family History: The patient's family history includes CAD in her mother and sister; Diabetes in her brother, father, mother, and sister; Healthy in her sister; Heart disease in her father; Hyperlipidemia in her mother; Hypertension in her brother, father, mother, and sister.   ROS:  Please see the history of present illness. Otherwise, complete review of systems is positive for none.  All other systems are reviewed and negative.   Physical Exam: VS:  BP 132/68   Pulse (!) 53   Ht 5\' 3"  (1.6 m)   Wt 260 lb 12.8 oz (118.3 kg)   SpO2 98%   BMI 46.20 kg/m , BMI Body mass index is 46.2 kg/m.  Wt Readings from Last 3 Encounters:  04/27/23 260 lb 12.8 oz (118.3 kg)  04/26/23 262 lb 3.2 oz (118.9 kg)  04/03/23 252 lb (114.3 kg)    General: Patient appears comfortable at rest. HEENT: Conjunctiva and lids normal, oropharynx clear with moist mucosa. Neck: Supple, no elevated JVP or carotid bruits, no thyromegaly. Lungs: Clear to auscultation, nonlabored breathing at rest. Cardiac: Regular rate and rhythm, ESM with S2 noted Abdomen: Soft, nontender, no hepatomegaly, bowel sounds present,  no guarding or rebound. Extremities: No pitting edema, distal pulses 2+. Skin: Warm and dry. Musculoskeletal: No kyphosis. Neuropsychiatric: Alert and oriented x3, affect grossly appropriate.  ECG: EKG on 03/21/2022 showed atrial fibrillation with frequent PVCs.  Recent Labwork: 04/03/2023: Hemoglobin 10.7; Platelets 292 04/26/2023: ALT 13; AST 19; BUN 19; Creatinine, Ser 0.68; Potassium 4.4; Sodium 142     Component Value Date/Time   CHOL 143 04/03/2023 1108   TRIG 84 04/03/2023 1108   TRIG 118 11/29/2013 0907   HDL 56 04/03/2023 1108   HDL 45 11/29/2013 0907   CHOLHDL 2.6 04/03/2023 1108   LDLCALC 71 04/03/2023 1108   LDLCALC  66 11/29/2013 0907    Other Studies Reviewed Today: Echo in 02/2022 Normal LVEF Mild aortic valve stenosis  Echo in 2019 LVEF 55 to 60% No valvular abnormalities   NM stress test in 2003 No inducible ischemia.  Normal LVEF  Assessment and Plan:   Chest pain, resolved with no recurrence: No workup indicated at this time.  DOE: Chronic stable DOE.  Will obtain BNP as she gained 8 pounds since last clinic visit.  Permanent A-fib, asymptomatic: EKG today showed atrial fibrillation with slow ventricular response, HR in 50s.  Continue metoprolol) tartrate 25 mg twice daily and Eliquis 5 mg twice daily.  High co-pay with Eliquis but she does not want to switch to Coumadin due to frequent INR checks.  She does not have any symptoms of dizziness, presyncope, syncope or palpitations.  Mild aortic valve stenosis in January 2024: Physical exam consistent with mild to moderate aortic valve stenosis.  No new symptoms of severe AS.  HTN, controlled: Continue amlodipine 5 mg once daily, lisinopril 40 mg once daily and Lasix 20 mg once daily.  HLD, at goal: Continue simvastatin 40 mg nightly.  LDL in 40s.  No side effects.  Medication Adjustments/Labs and Tests Ordered: Current medicines are reviewed at length with the patient today.  Concerns regarding medicines  are outlined above.   Tests Ordered: Orders Placed This Encounter  Procedures   EKG 12-Lead    Medication Changes: No orders of the defined types were placed in this encounter.   Disposition:  Follow up 1 year  Signed Tawney Vanorman Verne Spurr, MD, 04/27/2023 10:30 AM    [facility] [address] [place], [place], [place] [address]

## 2023-04-28 ENCOUNTER — Encounter (INDEPENDENT_AMBULATORY_CARE_PROVIDER_SITE_OTHER): Payer: Self-pay | Admitting: Gastroenterology

## 2023-04-28 DIAGNOSIS — Z1231 Encounter for screening mammogram for malignant neoplasm of breast: Secondary | ICD-10-CM | POA: Diagnosis not present

## 2023-04-28 LAB — HM MAMMOGRAPHY

## 2023-04-28 NOTE — Progress Notes (Signed)
 Percent saturation 7% ferritin 10 INR 1.1 Normal liver enzymes Negative AMA, ASMA Hep A nonimmune Hep B nonimmune nonexposed Hep C negative HIV negative

## 2023-04-29 LAB — BRAIN NATRIURETIC PEPTIDE: BNP: 92.4 pg/mL (ref 0.0–100.0)

## 2023-04-30 DIAGNOSIS — Z1211 Encounter for screening for malignant neoplasm of colon: Secondary | ICD-10-CM | POA: Diagnosis not present

## 2023-04-30 DIAGNOSIS — Z1212 Encounter for screening for malignant neoplasm of rectum: Secondary | ICD-10-CM | POA: Diagnosis not present

## 2023-05-02 DIAGNOSIS — Z7409 Other reduced mobility: Secondary | ICD-10-CM | POA: Insufficient documentation

## 2023-05-02 DIAGNOSIS — R262 Difficulty in walking, not elsewhere classified: Secondary | ICD-10-CM | POA: Insufficient documentation

## 2023-05-03 ENCOUNTER — Encounter: Payer: Self-pay | Admitting: Nurse Practitioner

## 2023-05-04 ENCOUNTER — Encounter (INDEPENDENT_AMBULATORY_CARE_PROVIDER_SITE_OTHER): Payer: Self-pay

## 2023-05-04 ENCOUNTER — Telehealth (INDEPENDENT_AMBULATORY_CARE_PROVIDER_SITE_OTHER): Payer: Self-pay | Admitting: *Deleted

## 2023-05-04 LAB — COMPREHENSIVE METABOLIC PANEL WITH GFR
ALT: 13 IU/L (ref 0–32)
AST: 19 IU/L (ref 0–40)
Albumin: 4 g/dL (ref 3.8–4.8)
Alkaline Phosphatase: 60 IU/L (ref 44–121)
BUN/Creatinine Ratio: 28 (ref 12–28)
BUN: 19 mg/dL (ref 8–27)
Bilirubin Total: 0.3 mg/dL (ref 0.0–1.2)
CO2: 24 mmol/L (ref 20–29)
Calcium: 9.3 mg/dL (ref 8.7–10.3)
Chloride: 100 mmol/L (ref 96–106)
Creatinine, Ser: 0.68 mg/dL (ref 0.57–1.00)
Globulin, Total: 2.9 g/dL (ref 1.5–4.5)
Glucose: 175 mg/dL — ABNORMAL HIGH (ref 70–99)
Potassium: 4.4 mmol/L (ref 3.5–5.2)
Sodium: 142 mmol/L (ref 134–144)
Total Protein: 6.9 g/dL (ref 6.0–8.5)
eGFR: 93 mL/min/{1.73_m2} (ref >59)

## 2023-05-04 LAB — ENA+DNA/DS+ANTICH+CENTRO+FA...
Anti JO-1: <0.2 AI (ref 0.0–0.9)
Centromere Ab Screen: <0.2 AI (ref 0.0–0.9)
Chromatin Ab SerPl-aCnc: <0.2 AI (ref 0.0–0.9)
ENA RNP Ab: <0.2 AI (ref 0.0–0.9)
ENA SM Ab Ser-aCnc: <0.2 AI (ref 0.0–0.9)
ENA SSA (RO) Ab: <0.2 AI (ref 0.0–0.9)
ENA SSB (LA) Ab: <0.2 AI (ref 0.0–0.9)
Scleroderma (Scl-70) (ENA) Antibody, IgG: 0.3 AI (ref 0.0–0.9)
Speckled Pattern: 1:80 {titer}
dsDNA Ab: 1 [IU]/mL (ref 0–9)

## 2023-05-04 LAB — PROTIME-INR
INR: 1.1 (ref 0.9–1.2)
Prothrombin Time: 12.4 s — ABNORMAL HIGH (ref 9.1–12.0)

## 2023-05-04 LAB — IRON,TIBC AND FERRITIN PANEL
Ferritin: 10 ng/mL — ABNORMAL LOW (ref 15–150)
Iron Saturation: 7 % — CL (ref 15–55)
Iron: 33 ug/dL (ref 27–139)
Total Iron Binding Capacity: 467 ug/dL — ABNORMAL HIGH (ref 250–450)
UIBC: 434 ug/dL — ABNORMAL HIGH (ref 118–369)

## 2023-05-04 LAB — ANA: ANA Titer 1: POSITIVE — AB

## 2023-05-04 LAB — HEPATITIS B SURFACE ANTIGEN: Hepatitis B Surface Ag: NEGATIVE

## 2023-05-04 LAB — HEPATITIS B SURFACE ANTIBODY,QUALITATIVE: Hep B Surface Ab, Qual: NONREACTIVE

## 2023-05-04 LAB — ANTI-SMOOTH MUSCLE ANTIBODY, IGG: Smooth Muscle Ab: 4 U (ref 0–19)

## 2023-05-04 LAB — HEPATITIS C ANTIBODY: Hep C Virus Ab: NONREACTIVE

## 2023-05-04 LAB — HEPATITIS B CORE ANTIBODY, TOTAL: Hep B Core Total Ab: NEGATIVE

## 2023-05-04 LAB — HEPATITIS A ANTIBODY, TOTAL: hep A Total Ab: NEGATIVE

## 2023-05-04 LAB — MITOCHONDRIAL ANTIBODIES: Mitochondrial Ab: <20 U (ref 0.0–20.0)

## 2023-05-04 LAB — HIV ANTIBODY (ROUTINE TESTING W REFLEX): HIV Screen 4th Generation wRfx: NONREACTIVE

## 2023-05-04 NOTE — Progress Notes (Signed)
BNP normal.

## 2023-05-04 NOTE — Telephone Encounter (Signed)
 Patient called and wanted to reschedule her procedures til June when the patient and her daughter Herbert Seta will out of work.  She states she sent cologuard test off Monday. She said she taking 2 iron 65 mg tablets daily now that her pcp put her on and one a day vitamin.   Patient states to call Herbert Seta ( on dpr )

## 2023-05-04 NOTE — Telephone Encounter (Signed)
 Pt daughter Herbert Seta contacted. Pt wanted [place] to be moved out to June also. 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 [place] rescheduled to 6/9 and I will call Herbert Seta once I receive June schedule to schedule her EGD. ([facility] )

## 2023-05-04 NOTE — Telephone Encounter (Signed)
 Thank you for informing   We will wait for cologuard test results , but when someone has iron deficiency cologuard is not appropriate test and diagnostic colonoscopy and upper endoscopy is indicated which I recommend

## 2023-05-04 NOTE — Telephone Encounter (Signed)
 Discussed with patient per Dr. Tasia Catchings - We will wait for cologuard test results , but when someone has iron deficiency cologuard is not appropriate test and diagnostic colonoscopy and upper endoscopy is indicated which I recommend   Patient verbalized understanding but still wanted to wait til June at this time.

## 2023-05-05 ENCOUNTER — Encounter: Payer: Self-pay | Admitting: Nurse Practitioner

## 2023-05-05 ENCOUNTER — Ambulatory Visit (INDEPENDENT_AMBULATORY_CARE_PROVIDER_SITE_OTHER)

## 2023-05-05 ENCOUNTER — Other Ambulatory Visit: Payer: Self-pay

## 2023-05-05 DIAGNOSIS — Z23 Encounter for immunization: Secondary | ICD-10-CM

## 2023-05-05 LAB — COLOGUARD: COLOGUARD: NEGATIVE

## 2023-05-05 MED ORDER — PANTOPRAZOLE SODIUM 40 MG PO TBEC: 40.0000 mg | DELAYED_RELEASE_TABLET | Freq: Two times a day (BID) | ORAL | 2 refills | Status: DC

## 2023-05-05 MED ORDER — PANTOPRAZOLE SODIUM 40 MG PO TBEC
40.0000 mg | DELAYED_RELEASE_TABLET | Freq: Two times a day (BID) | ORAL | 2 refills | Status: DC
Start: 2023-05-05 — End: 2023-07-27

## 2023-05-05 NOTE — Progress Notes (Signed)
 Patient is in office today for a nurse visit for [facility] Immunization. Patient Injection was given in the  Left deltoid. Patient tolerated injection well.

## 2023-05-08 ENCOUNTER — Ambulatory Visit (HOSPITAL_COMMUNITY)

## 2023-05-18 ENCOUNTER — Other Ambulatory Visit (HOSPITAL_COMMUNITY)

## 2023-05-25 ENCOUNTER — Encounter (HOSPITAL_COMMUNITY): Payer: Self-pay

## 2023-05-25 ENCOUNTER — Ambulatory Visit (HOSPITAL_COMMUNITY): Admit: 2023-05-25 | Admitting: Gastroenterology

## 2023-05-25 ENCOUNTER — Other Ambulatory Visit: Payer: Self-pay | Admitting: Nurse Practitioner

## 2023-05-25 DIAGNOSIS — M722 Plantar fascial fibromatosis: Secondary | ICD-10-CM | POA: Diagnosis not present

## 2023-05-25 DIAGNOSIS — E1159 Type 2 diabetes mellitus with other circulatory complications: Secondary | ICD-10-CM

## 2023-05-25 SURGERY — EGD (ESOPHAGOGASTRODUODENOSCOPY)
Anesthesia: Choice

## 2023-05-29 ENCOUNTER — Other Ambulatory Visit: Payer: Self-pay | Admitting: Nurse Practitioner

## 2023-06-08 ENCOUNTER — Ambulatory Visit

## 2023-06-09 ENCOUNTER — Ambulatory Visit (INDEPENDENT_AMBULATORY_CARE_PROVIDER_SITE_OTHER)

## 2023-06-09 DIAGNOSIS — Z23 Encounter for immunization: Secondary | ICD-10-CM

## 2023-06-09 NOTE — Progress Notes (Signed)
 Patient is in office today for a nurse visit for Immunization. Patient Injection was given in the  Left deltoid. Patient tolerated injection well.

## 2023-06-16 ENCOUNTER — Telehealth: Payer: Self-pay | Admitting: *Deleted

## 2023-06-16 ENCOUNTER — Encounter: Payer: Self-pay | Admitting: *Deleted

## 2023-06-16 NOTE — Telephone Encounter (Signed)
 Called pt back and she is aware of pre-op appt details. She voiced understanding

## 2023-06-16 NOTE — Telephone Encounter (Signed)
 Spoke with pt. She rescheduled EGD with Dr. Alita Irwin, ASA 3 6/19. She will call back if her daughter is not able to do this date. Aware will call back with pre-op appointment.   Previous still valid Authorization #[id number]  04/26/2023 -- 07/27/2023

## 2023-06-23 ENCOUNTER — Other Ambulatory Visit: Payer: Self-pay

## 2023-06-23 MED ORDER — GLIPIZIDE 5 MG PO TABS: 5.0000 mg | ORAL_TABLET | Freq: Two times a day (BID) | ORAL | 1 refills | Status: DC

## 2023-07-11 DIAGNOSIS — M79671 Pain in right foot: Secondary | ICD-10-CM | POA: Diagnosis not present

## 2023-07-17 ENCOUNTER — Ambulatory Visit (HOSPITAL_COMMUNITY)
Admission: RE | Admit: 2023-07-17 | Discharge: 2023-07-17 | Disposition: A | Discharge status: Home / Self Care | Source: Ambulatory Visit | Attending: Gastroenterology | Admitting: Gastroenterology

## 2023-07-17 DIAGNOSIS — K838 Other specified diseases of biliary tract: Secondary | ICD-10-CM | POA: Diagnosis not present

## 2023-07-17 DIAGNOSIS — K828 Other specified diseases of gallbladder: Secondary | ICD-10-CM | POA: Diagnosis not present

## 2023-07-17 DIAGNOSIS — R748 Abnormal levels of other serum enzymes: Secondary | ICD-10-CM | POA: Insufficient documentation

## 2023-07-17 DIAGNOSIS — K7460 Unspecified cirrhosis of liver: Secondary | ICD-10-CM | POA: Insufficient documentation

## 2023-07-17 DIAGNOSIS — K7689 Other specified diseases of liver: Secondary | ICD-10-CM | POA: Diagnosis not present

## 2023-07-24 NOTE — Patient Instructions (Signed)
 Ashley [name]  07/24/2023     @PREFPERIOPPHARMACY @   Your procedure is scheduled on  07/27/2023.   Report to [facility] at  0600 A.M.   Call this number if you have problems the morning of surgery:  [phone]  If you experience any cold or flu symptoms such as cough, fever, chills, shortness of breath, etc. between now and your scheduled surgery, please notify us  at the above number.   Remember:        Your last dose of eliquis  should be on 07/24/2023.       DO NOT take and medications for diabetes the morning of your procedure.   Follow the diet and prep instructions given to you by the office.   You may drink clear liquids until  0330 am on 07/27/2023.    Clear liquids allowed are:                    Water , Juice (No red color; non-citric and without pulp; diabetics please choose diet or no sugar options), Carbonated beverages (diabetics please choose diet or no sugar options), Clear Tea (No creamer, milk, or cream, including half & half and powdered creamer), Black Coffee Only (No creamer, milk or cream, including half & half and powdered creamer), and Clear Sports drink (No red color; diabetics please choose diet or no sugar options)    Take these medicines the morning of surgery with A SIP OF WATER                        amlodipine , clonidine , metoprolol , pantoprazole .    Do not wear jewelry, make-up or nail polish, including gel polish,  artificial nails, or any other type of covering on natural nails (fingers and  toes).  Do not wear lotions, powders, or perfumes, or deodorant.  Do not shave 48 hours prior to surgery.  Men may shave face and neck.  Do not bring valuables to the hospital.  [facility] is not responsible for any belongings or valuables.  Contacts, dentures or bridgework may not be worn into surgery.  Leave your suitcase in the car.  After surgery it may be brought to your room.  For patients admitted to the hospital, discharge time will be  determined by your treatment team.  Patients discharged the day of surgery will not be allowed to drive home and must have someone with them for 24 hours.    Special instructions:   DO NOT smoke tobacco or vape for 24 hours before your procedure.  Please read over the following fact sheets that you were given. Anesthesia Post-op Instructions and Care and Recovery After Surgery      Upper Endoscopy, Adult, Care After After the procedure, it is common to have a sore throat. It is also common to have: Mild stomach pain or discomfort. Bloating. Nausea. Follow these instructions at home: The instructions below may help you care for yourself at home. Your health care provider may give you more instructions. If you have questions, ask your health care provider. If you were given a sedative during the procedure, it can affect you for several hours. Do not drive or operate machinery until your health care provider says that it is safe. If you will be going home right after the procedure, plan to have a responsible adult: Take you home from the hospital or clinic. You will not be allowed to drive. Care for you  for the time you are told. Follow instructions from your health care provider about what you may eat and drink. Return to your normal activities as told by your health care provider. Ask your health care provider what activities are safe for you. Take over-the-counter and prescription medicines only as told by your health care provider. Contact a health care provider if you: Have a sore throat that lasts longer than one day. Have trouble swallowing. Have a fever. Get help right away if you: Vomit blood or your vomit looks like coffee grounds. Have bloody, black, or tarry stools. Have a very bad sore throat or you cannot swallow. Have difficulty breathing or very bad pain in your chest or abdomen. These symptoms may be an emergency. Get help right away. Call 911. Do not wait to see if  the symptoms will go away. Do not drive yourself to the hospital. Summary After the procedure, it is common to have a sore throat, mild stomach discomfort, bloating, and nausea. If you were given a sedative during the procedure, it can affect you for several hours. Do not drive until your health care provider says that it is safe. Follow instructions from your health care provider about what you may eat and drink. Return to your normal activities as told by your health care provider. This information is not intended to replace advice given to you by your health care provider. Make sure you discuss any questions you have with your health care provider. Document Revised: 05/05/2021 Document Reviewed: 05/05/2021 Elsevier Patient Education  2024 Elsevier Inc.General Anesthesia, Adult, Care After The following information offers guidance on how to care for yourself after your procedure. Your health care provider may also give you more specific instructions. If you have problems or questions, contact your health care provider. What can I expect after the procedure? After the procedure, it is common for people to: Have pain or discomfort at the IV site. Have nausea or vomiting. Have a sore throat or hoarseness. Have trouble concentrating. Feel cold or chills. Feel weak, sleepy, or tired (fatigue). Have soreness and body aches. These can affect parts of the body that were not involved in surgery. Follow these instructions at home: For the time period you were told by your health care provider:  Rest. Do not participate in activities where you could fall or become injured. Do not drive or use machinery. Do not drink alcohol. Do not take sleeping pills or medicines that cause drowsiness. Do not make important decisions or sign legal documents. Do not take care of children on your own. General instructions Drink enough fluid to keep your urine pale yellow. If you have sleep apnea, surgery and  certain medicines can increase your risk for breathing problems. Follow instructions from your health care provider about wearing your sleep device: Anytime you are sleeping, including during daytime naps. While taking prescription pain medicines, sleeping medicines, or medicines that make you drowsy. Return to your normal activities as told by your health care provider. Ask your health care provider what activities are safe for you. Take over-the-counter and prescription medicines only as told by your health care provider. Do not use any products that contain nicotine or tobacco. These products include cigarettes, chewing tobacco, and vaping devices, such as e-cigarettes. These can delay incision healing after surgery. If you need help quitting, ask your health care provider. Contact a health care provider if: You have nausea or vomiting that does not get better with medicine. You vomit every time  you eat or drink. You have pain that does not get better with medicine. You cannot urinate or have bloody urine. You develop a skin rash. You have a fever. Get help right away if: You have trouble breathing. You have chest pain. You vomit blood. These symptoms may be an emergency. Get help right away. Call 911. Do not wait to see if the symptoms will go away. Do not drive yourself to the hospital. Summary After the procedure, it is common to have a sore throat, hoarseness, nausea, vomiting, or to feel weak, sleepy, or fatigue. For the time period you were told by your health care provider, do not drive or use machinery. Get help right away if you have difficulty breathing, have chest pain, or vomit blood. These symptoms may be an emergency. This information is not intended to replace advice given to you by your health care provider. Make sure you discuss any questions you have with your health care provider. Document Revised: 04/23/2021 Document Reviewed: 04/23/2021 Elsevier Patient Education   2024 [employer].

## 2023-07-25 ENCOUNTER — Encounter (HOSPITAL_COMMUNITY)
Admission: RE | Admit: 2023-07-25 | Discharge: 2023-07-25 | Disposition: A | Discharge status: Home / Self Care | Source: Ambulatory Visit | Attending: Gastroenterology | Admitting: Gastroenterology

## 2023-07-25 ENCOUNTER — Other Ambulatory Visit: Payer: Self-pay

## 2023-07-25 ENCOUNTER — Encounter (HOSPITAL_COMMUNITY): Payer: Self-pay

## 2023-07-25 VITALS — BP 154/71 | HR 52 | Resp 18 | Ht 63.0 in | Wt 260.8 lb

## 2023-07-25 DIAGNOSIS — K7460 Unspecified cirrhosis of liver: Secondary | ICD-10-CM | POA: Insufficient documentation

## 2023-07-25 DIAGNOSIS — Z01812 Encounter for preprocedural laboratory examination: Secondary | ICD-10-CM | POA: Insufficient documentation

## 2023-07-25 HISTORY — DX: Headache, unspecified: R51.9

## 2023-07-25 HISTORY — DX: Dyspnea, unspecified: R06.00

## 2023-07-25 LAB — BASIC METABOLIC PANEL WITH GFR
Anion gap: 12 (ref 5–15)
BUN: 23 mg/dL (ref 8–23)
CO2: 23 mmol/L (ref 22–32)
Calcium: 8.9 mg/dL (ref 8.9–10.3)
Chloride: 105 mmol/L (ref 98–111)
Creatinine, Ser: 0.6 mg/dL (ref 0.44–1.00)
GFR, Estimated: >60 mL/min (ref >60)
Glucose, Bld: 217 mg/dL — ABNORMAL HIGH (ref 70–99)
Potassium: 3.8 mmol/L (ref 3.5–5.1)
Sodium: 140 mmol/L (ref 135–145)

## 2023-07-25 LAB — CBC WITH DIFFERENTIAL/PLATELET
Abs Immature Granulocytes: 0.02 10*3/uL (ref 0.00–0.07)
Basophils Absolute: 0 10*3/uL (ref 0.0–0.1)
Basophils Relative: 1 %
Eosinophils Absolute: 0.1 10*3/uL (ref 0.0–0.5)
Eosinophils Relative: 2 %
HCT: 38.3 % (ref 36.0–46.0)
Hemoglobin: 12.5 g/dL (ref 12.0–15.0)
Immature Granulocytes: 0 %
Lymphocytes Relative: 21 %
Lymphs Abs: 1.3 10*3/uL (ref 0.7–4.0)
MCH: 29.1 pg (ref 26.0–34.0)
MCHC: 32.6 g/dL (ref 30.0–36.0)
MCV: 89.3 fL (ref 80.0–100.0)
Monocytes Absolute: 0.6 10*3/uL (ref 0.1–1.0)
Monocytes Relative: 9 %
Neutro Abs: 4 10*3/uL (ref 1.7–7.7)
Neutrophils Relative %: 67 %
Platelets: 194 10*3/uL (ref 150–400)
RBC: 4.29 MIL/uL (ref 3.87–5.11)
RDW: 18.8 % — ABNORMAL HIGH (ref 11.5–15.5)
WBC: 6 10*3/uL (ref 4.0–10.5)
nRBC: 0 % (ref 0.0–0.2)

## 2023-07-25 LAB — PROTIME-INR
INR: 1.2 (ref 0.8–1.2)
Prothrombin Time: 15 s (ref 11.4–15.2)

## 2023-07-26 ENCOUNTER — Ambulatory Visit: Payer: Medicare HMO

## 2023-07-26 ENCOUNTER — Ambulatory Visit (INDEPENDENT_AMBULATORY_CARE_PROVIDER_SITE_OTHER): Payer: Self-pay | Admitting: Gastroenterology

## 2023-07-26 VITALS — BP 132/68 | HR 53 | Ht 63.0 in | Wt 260.0 lb

## 2023-07-26 DIAGNOSIS — Z0000 Encounter for general adult medical examination without abnormal findings: Secondary | ICD-10-CM

## 2023-07-26 NOTE — Patient Instructions (Signed)
 Ashley [name] , Thank you for taking time out of your busy schedule to complete your Annual Wellness Visit with me. I enjoyed our conversation and look forward to speaking with you again next year. I, as well as your care team,  appreciate your ongoing commitment to your health goals. Please review the following plan we discussed and let me know if I can assist you in the future. Your Game plan/ To Do List    Follow up Visits: Next Medicare AWV with our clinical staff: 07/26/24 at 10:00a.m.   Have you seen your provider in the last 6 months (3 months if uncontrolled diabetes)? Yes Next Office Visit with your provider: 10/31/23 at 11:00a.m.  Clinician Recommendations:  Aim for 30 minutes of exercise or brisk walking, 6-8 glasses of water , and 5 servings of fruits and vegetables each day. Pt is aware and due for foot exam.      This is a list of the screening recommended for you and due dates:  Health Maintenance  Topic Date Due   Complete foot exam   12/07/2022   DEXA scan (bone density measurement)  02/10/2024*   COVID-19 Vaccine (3 - 2024-25 season) 08/10/2024*   Flu Shot  09/08/2023   Yearly kidney health urinalysis for diabetes  09/26/2023   Hemoglobin A1C  10/01/2023   Eye exam for diabetics  12/20/2023   Mammogram  04/27/2024   Yearly kidney function blood test for diabetes  07/24/2024   Medicare Annual Wellness Visit  07/25/2024   Cologuard (Stool DNA test)  04/30/2026   DTaP/Tdap/Td vaccine (2 - Td or Tdap) 08/19/2027   Pneumococcal Vaccine for age over 37  Completed   Hepatitis C Screening  Completed   Zoster (Shingles) Vaccine  Completed   HPV Vaccine  Aged Out   Meningitis B Vaccine  Aged Out   Colon Cancer Screening  Discontinued  *Topic was postponed. The date shown is not the original due date.    Advanced directives: (In Chart) A copy of your advanced directives are scanned into your chart should your provider ever need it. Advance Care Planning is important because  it:  [x]  Makes sure you receive the medical care that is consistent with your values, goals, and preferences  [x]  It provides guidance to your family and loved ones and reduces their decisional burden about whether or not they are making the right decisions based on your wishes.  Follow the link provided in your after visit summary or read over the paperwork we have mailed to you to help you started getting your Advance Directives in place. If you need assistance in completing these, please reach out to us  so that we can help you!  See attachments for Preventive Care and Fall Prevention Tips.

## 2023-07-26 NOTE — Progress Notes (Signed)
 Subjective:   Ashley [name] is a 73 y.o. who presents for a Medicare Wellness preventive visit.  As a reminder, Annual Wellness Visits don't include a physical exam, and some assessments may be limited, especially if this visit is performed virtually. We may recommend an in-person follow-up visit with your provider if needed.  Visit Complete: Virtual I connected with  Ashley [name] on 07/26/23 by a audio enabled telemedicine application and verified that I am speaking with the correct person using two identifiers.  Patient Location: Home  Provider Location: Home Office  I discussed the limitations of evaluation and management by telemedicine. The patient expressed understanding and agreed to proceed.  Vital Signs: Because this visit was a virtual/telehealth visit, some criteria may be missing or patient reported. Any vitals not documented were not able to be obtained and vitals that have been documented are patient reported.  VideoDeclined- This patient declined Librarian, academic. Therefore the visit was completed with audio only.  Persons Participating in Visit: Patient.  AWV Questionnaire: Yes: Patient Medicare AWV questionnaire was completed by the patient on 07/24/23; I have confirmed that all information answered by patient is correct and no changes since this date.  Cardiac Risk Factors include: advanced age (>67men, >69 women);diabetes mellitus;dyslipidemia;hypertension;obesity (BMI >30kg/m2)     Objective:    Today's Vitals   07/24/23 1317 07/26/23 0913  BP:  132/68  Pulse:  (!) 53  Weight:  260 lb (117.9 kg)  Height:  5' 3 (1.6 m)  PainSc: 8     Body mass index is 46.06 kg/m.     07/26/2023    9:22 AM 07/25/2023   10:24 AM 02/03/2023    8:57 AM 02/02/2023    6:53 PM 02/02/2023   12:56 PM 07/25/2022   10:38 AM 07/19/2021   12:12 PM  Advanced Directives  Does Patient Have a Medical Advance Directive? Yes Yes Yes Yes Yes Yes  Yes  Type of Estate agent of [place];Living will Healthcare Power of [employer] of [place];Living will Healthcare Power of [place];Living will Healthcare Power of [place];Living will Healthcare Power of [place];Living will Healthcare Power of [place];Living will  Does patient want to make changes to medical advance directive?    No - Patient declined  No - Patient declined   Copy of Healthcare Power of Attorney in Chart? No - copy requested   No - copy requested  Yes - validated most recent copy scanned in chart (See row information) Yes - validated most recent copy scanned in chart (See row information)    Current Medications (verified) Outpatient Encounter Medications as of 07/26/2023  Medication Sig   Accu-Chek Softclix Lancets lancets TEST BLOOD SUGAR TWICE DAILY Dx E11.9   acetaminophen  (TYLENOL ) 500 MG tablet Take 1,000 mg by mouth 2 (two) times daily as needed for moderate pain (pain score 4-6) or headache (sometimes 3 times a day).   albuterol  (VENTOLIN  HFA) 108 (90 Base) MCG/ACT inhaler INHALE 2 PUFFS INTO THE LUNGS EVERY 6 HOURS AS NEEDED FOR WHEEZE OR SHORTNESS OF BREATH   Alcohol Swabs (DROPSAFE ALCOHOL PREP) 70 % PADS TEST BLOOD SUGAR TWICE DAILY Dx E11.9   ALPRAZolam  (XANAX ) 0.25 MG tablet Take 1 tablet (0.25 mg total) by mouth at bedtime.   amLODipine  (NORVASC ) 5 MG tablet Take 1 tablet (5 mg total) by mouth daily.   apixaban  (ELIQUIS ) 5 MG TABS tablet Take 1 tablet (5 mg total) by mouth 2 (two) times daily.  Ascorbic Acid (VITAMIN C PO) Take 1 tablet by mouth at bedtime.   Blood Glucose Monitoring Suppl (ACCU-CHEK GUIDE ME) w/Device KIT TEST BLOOD SUGAR TWICE DAILY Dx E11.9   celecoxib  (CELEBREX ) 200 MG capsule TAKE 1 CAPSULE TWICE DAILY   Cholecalciferol (VITAMIN D-3 PO) Take 1 tablet by mouth at bedtime.   CINNAMON PO Take 1 capsule by mouth daily.    cloNIDine  (CATAPRES ) 0.1 MG tablet TAKE 1 TABLET TWICE DAILY   Ferrous Sulfate  (IRON) 325 (65 Fe) MG TABS Take by mouth. Takes two daily   furosemide  (LASIX ) 20 MG tablet Take 1 tablet (20 mg total) by mouth daily.   Ginger, Zingiber officinalis, (GINGER ROOT PO) Take 1 capsule by mouth daily.    glipiZIDE  (GLUCOTROL ) 5 MG tablet Take 1 tablet (5 mg total) by mouth 2 (two) times daily before a meal.   glucose blood (ACCU-CHEK AVIVA PLUS) test strip TEST BLOOD SUGAR TWICE DAILY Dx E11.9   lisinopril  (ZESTRIL ) 40 MG tablet TAKE 1 TABLET EVERY DAY   metFORMIN  (GLUCOPHAGE -XR) 500 MG 24 hr tablet Take 1 tablet (500 mg total) by mouth daily with breakfast.   metoprolol  tartrate (LOPRESSOR ) 50 MG tablet Take 1 tablet (50 mg total) by mouth 2 (two) times daily.   Multiple Vitamins-Minerals (ZINC PO) Take 1 tablet by mouth at bedtime.   Omega-3 Fatty Acids (FISH OIL PO) Take 1 capsule by mouth daily.   pantoprazole  (PROTONIX ) 40 MG tablet Take 1 tablet (40 mg total) by mouth 2 (two) times daily.   polyethylene glycol (MIRALAX  / GLYCOLAX ) 17 g packet Take 17 g by mouth daily as needed for mild constipation or moderate constipation.   simvastatin  (ZOCOR ) 40 MG tablet Take 1 tablet (40 mg total) by mouth every evening.   sucralfate  (CARAFATE ) 1 g tablet Take 1 tablet (1 g total) by mouth 4 (four) times daily.   tolterodine  (DETROL  LA) 2 MG 24 hr capsule Take 1 capsule (2 mg total) by mouth daily.   TURMERIC CURCUMIN PO Take 1 capsule by mouth daily.   No facility-administered encounter medications on file as of 07/26/2023.    Allergies (verified) Iodine and Cipro  [ciprofloxacin  hcl]   History: Past Medical History:  Diagnosis Date   A-fib (HCC)    Arthritis    Depression    Diabetes mellitus without complication (HCC)    Dyspnea    Headache    Heart disease    Hyperlipidemia    Hypertension    Hypertension    Past Surgical History:  Procedure Laterality Date   BIOPSY  02/03/2023   Procedure: BIOPSY;  Surgeon: Hargis Lias, MD;  [facility];  Service:  Endoscopy;;   CYST EXCISION Left 03/15/2017   from buttocks   ESOPHAGOGASTRODUODENOSCOPY (EGD) WITH PROPOFOL  N/A 02/03/2023   Procedure: ESOPHAGOGASTRODUODENOSCOPY (EGD) WITH PROPOFOL ;  Surgeon: Hargis Lias, MD;  Location: [facility];  Service: Endoscopy;  Laterality: N/A;   ORIF ANKLE FRACTURE Left 04/20/2017   Procedure: OPEN REDUCTION INTERNAL FIXATION (ORIF) LEFT ANKLE FRACTURE;  Surgeon: Winston Hawking, MD;  Location: [facility] OR;  Service: Orthopedics;  Laterality: Left;   SCLEROTHERAPY  02/03/2023   Procedure: SCLEROTHERAPY;  Surgeon: Hargis Lias, MD;  Location: [facility];  Service: Endoscopy;;   Family History  Problem Relation Age of Onset   Diabetes Mother    CAD Mother        CABG at age 78   Hypertension Mother    Hyperlipidemia Mother  Diabetes Father    Heart disease Father        CHF   Hypertension Father    Diabetes Sister    CAD Sister    Hypertension Sister    Diabetes Brother    Hypertension Brother    Healthy Sister    Social History   Socioeconomic History   Marital status: Widowed    Spouse name: Not on file   Number of children: 2   Years of education: 12   Highest education level: High school graduate  Occupational History   Occupation: School bus Air traffic controller: Therapist, [employer]  Tobacco Use   Smoking status: Never   Smokeless tobacco: Never  Vaping Use   Vaping status: Never Used  Substance and Sexual Activity   Alcohol use: No   Drug use: No   Sexual activity: Not Currently  Other Topics Concern   Not on file  Social History Narrative   Lives alone. Driving bus part time. Daughter lives across the street.   Enjoys gardening and quilting. Physical activity limited due to ankle pain since fx in 2019   Social Drivers of Health   Financial Resource Strain: Low Risk  (07/26/2023)   Overall Financial Resource Strain (CARDIA)    Difficulty of Paying Living Expenses: Not hard at all  Food Insecurity: No Food Insecurity  (07/26/2023)   Hunger Vital Sign    Worried About Running Out of Food in the Last Year: Never true    Ran Out of Food in the Last Year: Never true  Transportation Needs: No Transportation Needs (07/26/2023)   PRAPARE - Administrator, Civil Service (Medical): No    Lack of Transportation (Non-Medical): No  Physical Activity: Insufficiently Active (07/26/2023)   Exercise Vital Sign    Days of Exercise per Week: 3 days    Minutes of Exercise per Session: 10 min  Stress: No Stress Concern Present (07/26/2023)   [employer] of Occupational Health - Occupational Stress Questionnaire    Feeling of Stress: Not at all  Social Connections: Moderately Isolated (07/26/2023)   Social Connection and Isolation Panel    Frequency of Communication with Friends and Family: More than three times a week    Frequency of Social Gatherings with Friends and Family: More than three times a week    Attends Religious Services: More than 4 times per year    Active Member of [employer] or Organizations: No    Attends Banker Meetings: Never    Marital Status: Widowed    Tobacco Counseling Counseling given: Yes    Clinical Intake:  Pre-visit preparation completed: Yes  Pain : 0-10 (leg/hip) Pain Score: 8  Pain Type: Chronic pain Pain Location: Hip (hip/leg) Pain Orientation: Left Pain Descriptors / Indicators: Aching, Constant Pain Onset: Other (comment) Pain Frequency: Constant Pain Relieving Factors: tylenol   Pain Relieving Factors: tylenol   BMI - recorded: 46.06 Nutritional Status: BMI > 30  Obese Nutritional Risks: None Diabetes: No  Lab Results  Component Value Date   HGBA1C 6.7 (H) 04/03/2023   HGBA1C 7.5 (H) 02/02/2023   HGBA1C 7.0 (H) 09/26/2022     How often do you need to have someone help you when you read instructions, pamphlets, or other written materials from your doctor or pharmacy?: 1 - Never  Interpreter Needed?: No  Information entered by :: Alia  t/cma   Activities of Daily Living     07/25/2023   10:24 AM 07/24/2023  1:17 PM  In your present state of health, do you have any difficulty performing the following activities:  Hearing? 0 0  Vision? 0 0  Difficulty concentrating or making decisions? 0 0  Walking or climbing stairs?  1  Dressing or bathing?  0  Doing errands, shopping?  0  Preparing Food and eating ?  N  Using the Toilet?  N  In the past six months, have you accidently leaked urine?  Y  Do you have problems with loss of bowel control?  N  Managing your Medications?  N  Managing your Finances?  N  Housekeeping or managing your Housekeeping?  N    Patient Care Team: Delfina Feller, FNP as PCP - General (Nurse Practitioner) Lasalle Pointer, MD as PCP - Cardiology (Cardiology) Winston Hawking, MD as Consulting Physician (Orthopedic Surgery) Hyland Mailman, MD as Referring Physician (Optometry) Lorry Rouge, MD as Referring Physician (Dermatology)  I have updated your Care Teams any recent Medical Services you may have received from other providers in the past year.     Assessment:   This is a routine wellness examination for [place].  Hearing/Vision screen Hearing Screening - Comments:: Pt denies hearing dif Vision Screening - Comments:: Pt wears non-reading glasses for up close/pt goes to [facility] in [place]/last ov this year   Goals Addressed             This Visit's Progress    Exercise 3 times per week for 30 minutes    On track    Walking and riding stationary bicycle are great options.     Patient Stated   On track    04/16/2019 AWV Goal: Exercise for General Health  Patient will verbalize understanding of the benefits of increased physical activity: Exercising regularly is important. It will improve your overall fitness, flexibility, and endurance. Regular exercise also will improve your overall health. It can help you control your weight, reduce stress, and improve your  bone density. Over the next year, patient will increase physical activity as tolerated with a goal of at least 150 minutes of moderate physical activity per week.  You can tell that you are exercising at a moderate intensity if your heart starts beating faster and you start breathing faster but can still hold a conversation. Moderate-intensity exercise ideas include: Walking 1 mile (1.6 km) in about 15 minutes Biking Hiking Golfing Dancing Water  aerobics Patient will verbalize understanding of everyday activities that increase physical activity by providing examples like the following: Yard work, such as: Insurance underwriter Gardening Washing windows or floors Patient will be able to explain general safety guidelines for exercising:  Before you start a new exercise program, talk with your health care provider. Do not exercise so much that you hurt yourself, feel dizzy, or get very short of breath. Wear comfortable clothes and wear shoes with good support. Drink plenty of water  while you exercise to prevent dehydration or heat stroke. Work out until your breathing and your heartbeat get faster.        Depression Screen     07/26/2023    9:28 AM 04/03/2023   11:09 AM 02/10/2023   12:19 PM 12/16/2022    2:43 PM 09/26/2022   10:34 AM 07/25/2022   10:37 AM 06/17/2022   10:52 AM  PHQ 2/9 Scores  PHQ - 2 Score 0 4 0 0 4 0 4  PHQ- 9 Score  15  0 10 0 11    Fall Risk     07/24/2023    1:17 PM 04/03/2023   11:09 AM 02/10/2023   12:18 PM 12/16/2022    2:43 PM 09/26/2022   10:34 AM  Fall Risk   Falls in the past year? 0 0 0 0 0  Number falls in past yr: 0      Injury with Fall? 0        MEDICARE RISK AT HOME:  Medicare Risk at Home Any stairs in or around the home?: (Patient-Rptd) Yes If so, are there any without handrails?: (Patient-Rptd) No Home free of loose throw rugs in walkways, pet beds, electrical  cords, etc?: (Patient-Rptd) Yes Adequate lighting in your home to reduce risk of falls?: (Patient-Rptd) Yes Life alert?: (Patient-Rptd) No Use of a cane, walker or w/c?: (Patient-Rptd) Yes Grab bars in the bathroom?: (Patient-Rptd) No Shower chair or bench in shower?: (Patient-Rptd) Yes Elevated toilet seat or a handicapped toilet?: (Patient-Rptd) No  TIMED UP AND GO:  Was the test performed?  no  Cognitive Function: 6CIT completed    04/13/2018    1:53 PM 04/13/2018    1:33 PM  MMSE - Mini Mental State Exam  Orientation to time 5 5  Orientation to Place 5 5  Registration 3   Attention/ Calculation 4   Recall 1   Language- name 2 objects 2   Language- repeat 1   Language- follow 3 step command 3   Language- read & follow direction 1   Write a sentence 1   Copy design 1   Total score 27         07/26/2023    9:22 AM 07/25/2022   10:38 AM 07/19/2021   12:15 PM 04/16/2019   10:30 AM  6CIT Screen  What Year? 0 points 0 points 0 points 0 points  What month? 0 points 0 points 0 points 0 points  What time? 0 points 0 points 0 points 0 points  Count back from 20 0 points 0 points 0 points 0 points  Months in reverse 4 points 0 points 2 points 0 points  Repeat phrase 0 points 0 points 0 points 0 points  Total Score 4 points 0 points 2 points 0 points    Immunizations Immunization History  Administered Date(s) Administered   Fluad Quad(high Dose 65+) 11/02/2018, 11/24/2020   Hep A / Hep B 05/05/2023, 06/09/2023   Influenza, High Dose Seasonal PF 11/07/2017   Influenza,inj,Quad PF,6+ Mos 10/31/2012, 11/14/2014, 11/03/2016   Influenza-Unspecified 10/25/2013, 11/03/2016   Moderna Sars-Covid-2 Vaccination 03/13/2019, 04/10/2019   Pneumococcal Conjugate-13 11/02/2018   Pneumococcal Polysaccharide-23 10/31/2012, 05/03/2017   Tdap 08/18/2017   Zoster Recombinant(Shingrix ) 08/18/2017, 11/07/2017   Zoster, Live 01/08/2013    Screening Tests Health Maintenance  Topic Date Due    FOOT EXAM  12/07/2022   DEXA SCAN  02/10/2024 (Originally 12/01/2022)   COVID-19 Vaccine (3 - 2024-25 season) 08/10/2024 (Originally 10/09/2022)   INFLUENZA VACCINE  09/08/2023   Diabetic kidney evaluation - Urine ACR  09/26/2023   HEMOGLOBIN A1C  10/01/2023   OPHTHALMOLOGY EXAM  12/20/2023   MAMMOGRAM  04/27/2024   Diabetic kidney evaluation - eGFR measurement  07/24/2024   Medicare Annual Wellness (AWV)  07/25/2024   Fecal DNA (Cologuard)  04/30/2026   DTaP/Tdap/Td (2 - Td or Tdap) 08/19/2027   Pneumococcal Vaccine: 50+ Years  Completed   Hepatitis C Screening  Completed   Zoster Vaccines- Shingrix   Completed  HPV VACCINES  Aged Out   Meningococcal B Vaccine  Aged Out   Colonoscopy  Discontinued    Health Maintenance  Health Maintenance Due  Topic Date Due   FOOT EXAM  12/07/2022   Health Maintenance Items Addressed: See Nurse Notes at the end of this note  Additional Screening:  Vision Screening: Recommended annual ophthalmology exams for early detection of glaucoma and other disorders of the eye. Would you like a referral to an eye doctor? No    Dental Screening: Recommended annual dental exams for proper oral hygiene  Community Resource Referral / Chronic Care Management: CRR required this visit?  No   CCM required this visit?  No   Plan:    I have personally reviewed and noted the following in the patient's chart:   Medical and social history Use of alcohol, tobacco or illicit drugs  Current medications and supplements including opioid prescriptions. Patient is not currently taking opioid prescriptions. Functional ability and status Nutritional status Physical activity Advanced directives List of other physicians Hospitalizations, surgeries, and ER visits in previous 12 months Vitals Screenings to include cognitive, depression, and falls Referrals and appointments  In addition, I have reviewed and discussed with patient certain preventive protocols,  quality metrics, and best practice recommendations. A written personalized care plan for preventive services as well as general preventive health recommendations were provided to patient.   Michaelle Adolphus, CMA   07/26/2023   After Visit Summary: (MyChart) Due to this being a telephonic visit, the after visit summary with patients personalized plan was offered to patient via MyChart   Notes:  Pt is aware and due for foot exam.

## 2023-07-26 NOTE — Progress Notes (Signed)
 Ultrasound for  HCC screening in setting of cirrhosis 07/2023  IMPRESSION: Nodular heterogeneous echogenic liver consistent with known chronic liver disease. No biliary ductal dilatation. Gallbladder sludge.   No splenomegaly.   Portions of the abdomen are obscured by overlapping bowel gas and soft tissue.  HCC screening every 6 month

## 2023-07-27 ENCOUNTER — Observation Stay (HOSPITAL_COMMUNITY)
Admission: RE | Admit: 2023-07-27 | Discharge: 2023-07-28 | Disposition: A | Discharge status: Home / Self Care | Attending: Family Medicine | Admitting: Family Medicine

## 2023-07-27 ENCOUNTER — Encounter (HOSPITAL_COMMUNITY)
Admission: RE | Disposition: A | Discharge status: Home / Self Care | Payer: Self-pay | Source: Home / Self Care | Attending: Family Medicine

## 2023-07-27 ENCOUNTER — Ambulatory Visit (HOSPITAL_COMMUNITY): Admitting: Certified Registered"

## 2023-07-27 ENCOUNTER — Other Ambulatory Visit: Payer: Self-pay

## 2023-07-27 ENCOUNTER — Encounter (HOSPITAL_COMMUNITY): Payer: Self-pay | Admitting: Gastroenterology

## 2023-07-27 DIAGNOSIS — Z13810 Encounter for screening for upper gastrointestinal disorder: Secondary | ICD-10-CM

## 2023-07-27 DIAGNOSIS — Z7901 Long term (current) use of anticoagulants: Secondary | ICD-10-CM | POA: Insufficient documentation

## 2023-07-27 DIAGNOSIS — R0602 Shortness of breath: Secondary | ICD-10-CM | POA: Insufficient documentation

## 2023-07-27 DIAGNOSIS — K922 Gastrointestinal hemorrhage, unspecified: Secondary | ICD-10-CM | POA: Diagnosis not present

## 2023-07-27 DIAGNOSIS — E6689 Other obesity not elsewhere classified: Secondary | ICD-10-CM | POA: Insufficient documentation

## 2023-07-27 DIAGNOSIS — I10 Essential (primary) hypertension: Secondary | ICD-10-CM

## 2023-07-27 DIAGNOSIS — K3182 Dieulafoy lesion (hemorrhagic) of stomach and duodenum: Secondary | ICD-10-CM

## 2023-07-27 DIAGNOSIS — R0609 Other forms of dyspnea: Secondary | ICD-10-CM | POA: Diagnosis not present

## 2023-07-27 DIAGNOSIS — R519 Headache, unspecified: Secondary | ICD-10-CM | POA: Insufficient documentation

## 2023-07-27 DIAGNOSIS — Z791 Long term (current) use of non-steroidal anti-inflammatories (NSAID): Secondary | ICD-10-CM | POA: Diagnosis not present

## 2023-07-27 DIAGNOSIS — F32A Depression, unspecified: Secondary | ICD-10-CM | POA: Insufficient documentation

## 2023-07-27 DIAGNOSIS — Z79899 Other long term (current) drug therapy: Secondary | ICD-10-CM | POA: Insufficient documentation

## 2023-07-27 DIAGNOSIS — E119 Type 2 diabetes mellitus without complications: Secondary | ICD-10-CM | POA: Diagnosis not present

## 2023-07-27 DIAGNOSIS — Z833 Family history of diabetes mellitus: Secondary | ICD-10-CM | POA: Diagnosis not present

## 2023-07-27 DIAGNOSIS — K274 Chronic or unspecified peptic ulcer, site unspecified, with hemorrhage: Secondary | ICD-10-CM

## 2023-07-27 DIAGNOSIS — I4891 Unspecified atrial fibrillation: Secondary | ICD-10-CM | POA: Diagnosis not present

## 2023-07-27 DIAGNOSIS — Z7984 Long term (current) use of oral hypoglycemic drugs: Secondary | ICD-10-CM | POA: Diagnosis not present

## 2023-07-27 DIAGNOSIS — E785 Hyperlipidemia, unspecified: Secondary | ICD-10-CM

## 2023-07-27 DIAGNOSIS — K259 Gastric ulcer, unspecified as acute or chronic, without hemorrhage or perforation: Secondary | ICD-10-CM | POA: Diagnosis not present

## 2023-07-27 DIAGNOSIS — Z6841 Body Mass Index (BMI) 40.0 and over, adult: Secondary | ICD-10-CM | POA: Diagnosis not present

## 2023-07-27 DIAGNOSIS — K2970 Gastritis, unspecified, without bleeding: Secondary | ICD-10-CM

## 2023-07-27 DIAGNOSIS — K3189 Other diseases of stomach and duodenum: Principal | ICD-10-CM

## 2023-07-27 DIAGNOSIS — Z8711 Personal history of peptic ulcer disease: Secondary | ICD-10-CM | POA: Insufficient documentation

## 2023-07-27 HISTORY — PX: ESOPHAGOGASTRODUODENOSCOPY: SHX5428

## 2023-07-27 LAB — GLUCOSE, CAPILLARY
Glucose-Capillary: 204 mg/dL — ABNORMAL HIGH (ref 70–99)
Glucose-Capillary: 172 mg/dL — ABNORMAL HIGH (ref 70–99)
Glucose-Capillary: 192 mg/dL — ABNORMAL HIGH (ref 70–99)
Glucose-Capillary: 174 mg/dL — ABNORMAL HIGH (ref 70–99)

## 2023-07-27 LAB — HEMOGLOBIN AND HEMATOCRIT, BLOOD
HCT: 38 % (ref 36.0–46.0)
Hemoglobin: 12.5 g/dL (ref 12.0–15.0)

## 2023-07-27 SURGERY — EGD (ESOPHAGOGASTRODUODENOSCOPY)
Anesthesia: General

## 2023-07-27 MED ORDER — SIMVASTATIN 20 MG PO TABS: 40.0000 mg | ORAL_TABLET | Freq: Every evening | ORAL | Status: DC

## 2023-07-27 MED ORDER — ACETAMINOPHEN 325 MG PO TABS
650.0000 mg | ORAL_TABLET | Freq: Four times a day (QID) | ORAL | Status: DC | PRN
  Administered 2023-07-27: 650 mg via ORAL
  Filled 2023-07-27: qty 2

## 2023-07-27 MED ORDER — ALPRAZOLAM 0.25 MG PO TABS
0.2500 mg | ORAL_TABLET | Freq: Every day | ORAL | Status: DC
  Administered 2023-07-27: 0.25 mg via ORAL
  Filled 2023-07-27: qty 1

## 2023-07-27 MED ORDER — ACETAMINOPHEN 500 MG PO TABS
500.0000 mg | ORAL_TABLET | Freq: Once | ORAL | Status: AC
  Administered 2023-07-27: 500 mg via ORAL

## 2023-07-27 MED ORDER — SODIUM CHLORIDE 0.9 % IV SOLN: INTRAVENOUS | Status: AC | PRN

## 2023-07-27 MED ORDER — ACETAMINOPHEN 500 MG PO TABS
ORAL_TABLET | ORAL | Status: AC
  Filled 2023-07-27: qty 1

## 2023-07-27 MED ORDER — POLYETHYLENE GLYCOL 3350 17 G PO PACK
17.0000 g | PACK | Freq: Every day | ORAL | Status: DC
  Administered 2023-07-28: 17 g via ORAL
  Filled 2023-07-27: qty 1

## 2023-07-27 MED ORDER — EPHEDRINE SULFATE-NACL 50-0.9 MG/10ML-% IV SOSY
PREFILLED_SYRINGE | INTRAVENOUS | Status: DC | PRN
  Administered 2023-07-27 (×2): 5 mg via INTRAVENOUS

## 2023-07-27 MED ORDER — PHENYLEPHRINE 80 MCG/ML (10ML) SYRINGE FOR IV PUSH (FOR BLOOD PRESSURE SUPPORT)
PREFILLED_SYRINGE | INTRAVENOUS | Status: AC
  Filled 2023-07-27: qty 10

## 2023-07-27 MED ORDER — ATORVASTATIN CALCIUM 20 MG PO TABS
20.0000 mg | ORAL_TABLET | Freq: Every evening | ORAL | Status: DC
  Administered 2023-07-27: 20 mg via ORAL
  Filled 2023-07-27: qty 2

## 2023-07-27 MED ORDER — CLONIDINE HCL 0.1 MG PO TABS
0.1000 mg | ORAL_TABLET | Freq: Two times a day (BID) | ORAL | Status: DC
  Administered 2023-07-27 – 2023-07-28 (×2): 0.1 mg via ORAL
  Filled 2023-07-27 (×2): qty 1

## 2023-07-27 MED ORDER — AMLODIPINE BESYLATE 5 MG PO TABS
5.0000 mg | ORAL_TABLET | Freq: Every day | ORAL | Status: DC
  Administered 2023-07-28: 5 mg via ORAL
  Filled 2023-07-27: qty 1

## 2023-07-27 MED ORDER — LACTATED RINGERS IV SOLN: INTRAVENOUS | Status: DC

## 2023-07-27 MED ORDER — BISACODYL 10 MG RE SUPP
10.0000 mg | Freq: Every day | RECTAL | Status: DC | PRN
Start: 2023-07-27 — End: 2023-07-28

## 2023-07-27 MED ORDER — ONDANSETRON HCL 4 MG PO TABS
4.0000 mg | ORAL_TABLET | Freq: Four times a day (QID) | ORAL | Status: DC | PRN
Start: 2023-07-27 — End: 2023-07-28

## 2023-07-27 MED ORDER — PROPOFOL 10 MG/ML IV BOLUS
INTRAVENOUS | Status: DC | PRN
  Administered 2023-07-27: 100 mg via INTRAVENOUS

## 2023-07-27 MED ORDER — PROPOFOL 500 MG/50ML IV EMUL
INTRAVENOUS | Status: DC | PRN
  Administered 2023-07-27: 150 ug/kg/min via INTRAVENOUS

## 2023-07-27 MED ORDER — SODIUM CHLORIDE 0.9% FLUSH: 3.0000 mL | INTRAVENOUS | Status: DC | PRN

## 2023-07-27 MED ORDER — PANTOPRAZOLE SODIUM 40 MG IV SOLR
40.0000 mg | Freq: Two times a day (BID) | INTRAVENOUS | Status: DC
  Administered 2023-07-27 – 2023-07-28 (×3): 40 mg via INTRAVENOUS
  Filled 2023-07-27 (×3): qty 10

## 2023-07-27 MED ORDER — SUCRALFATE 1 G PO TABS
1.0000 g | ORAL_TABLET | Freq: Four times a day (QID) | ORAL | Status: DC
  Administered 2023-07-27 – 2023-07-28 (×5): 1 g via ORAL
  Filled 2023-07-27 (×5): qty 1

## 2023-07-27 MED ORDER — METOPROLOL TARTRATE 50 MG PO TABS
50.0000 mg | ORAL_TABLET | Freq: Two times a day (BID) | ORAL | Status: DC
  Administered 2023-07-27: 50 mg via ORAL
  Filled 2023-07-27: qty 1

## 2023-07-27 MED ORDER — HYDRALAZINE HCL 20 MG/ML IJ SOLN: 10.0000 mg | Freq: Four times a day (QID) | INTRAMUSCULAR | Status: DC | PRN

## 2023-07-27 MED ORDER — GLYCOPYRROLATE PF 0.2 MG/ML IJ SOSY
PREFILLED_SYRINGE | INTRAMUSCULAR | Status: AC
Start: 2023-07-27 — End: 2023-07-27
  Filled 2023-07-27: qty 1

## 2023-07-27 MED ORDER — SODIUM CHLORIDE 0.9% FLUSH
3.0000 mL | Freq: Two times a day (BID) | INTRAVENOUS | Status: DC
  Administered 2023-07-27 – 2023-07-28 (×2): 3 mL via INTRAVENOUS

## 2023-07-27 MED ORDER — LACTATED RINGERS IV SOLN: INTRAVENOUS | Status: DC | PRN

## 2023-07-27 MED ORDER — EPHEDRINE 5 MG/ML INJ
INTRAVENOUS | Status: AC
Start: 2023-07-27 — End: 2023-07-27
  Filled 2023-07-27: qty 5

## 2023-07-27 MED ORDER — APIXABAN 5 MG PO TABS: 5.0000 mg | ORAL_TABLET | Freq: Two times a day (BID) | ORAL | Status: DC

## 2023-07-27 MED ORDER — GLYCOPYRROLATE PF 0.2 MG/ML IJ SOSY
PREFILLED_SYRINGE | INTRAMUSCULAR | Status: DC | PRN
  Administered 2023-07-27: .2 mg via INTRAVENOUS

## 2023-07-27 MED ORDER — ONDANSETRON HCL 4 MG/2ML IJ SOLN: 4.0000 mg | Freq: Four times a day (QID) | INTRAMUSCULAR | Status: DC | PRN

## 2023-07-27 MED ORDER — METOPROLOL TARTRATE 25 MG PO TABS
25.0000 mg | ORAL_TABLET | Freq: Two times a day (BID) | ORAL | Status: DC
Start: 2023-07-28 — End: 2023-07-28
  Administered 2023-07-28: 25 mg via ORAL
  Filled 2023-07-27: qty 1

## 2023-07-27 MED ORDER — ACETAMINOPHEN 650 MG RE SUPP: 650.0000 mg | Freq: Four times a day (QID) | RECTAL | Status: DC | PRN

## 2023-07-27 MED ORDER — INSULIN ASPART 100 UNIT/ML IJ SOLN
0.0000 [IU] | Freq: Three times a day (TID) | INTRAMUSCULAR | Status: DC
  Administered 2023-07-27: 2 [IU] via SUBCUTANEOUS
  Administered 2023-07-28: 3 [IU] via SUBCUTANEOUS
  Administered 2023-07-28: 2 [IU] via SUBCUTANEOUS

## 2023-07-27 MED ORDER — SODIUM CHLORIDE 0.9% FLUSH
3.0000 mL | Freq: Two times a day (BID) | INTRAVENOUS | Status: DC
  Administered 2023-07-27 – 2023-07-28 (×3): 3 mL via INTRAVENOUS

## 2023-07-27 MED ORDER — INSULIN ASPART 100 UNIT/ML IJ SOLN: 0.0000 [IU] | Freq: Every day | INTRAMUSCULAR | Status: DC

## 2023-07-27 MED ORDER — PHENYLEPHRINE 80 MCG/ML (10ML) SYRINGE FOR IV PUSH (FOR BLOOD PRESSURE SUPPORT)
PREFILLED_SYRINGE | INTRAVENOUS | Status: DC | PRN
  Administered 2023-07-27: 80 ug via INTRAVENOUS
  Administered 2023-07-27: 160 ug via INTRAVENOUS
  Administered 2023-07-27: 80 ug via INTRAVENOUS

## 2023-07-27 MED ORDER — LIDOCAINE 2% (20 MG/ML) 5 ML SYRINGE
INTRAMUSCULAR | Status: DC | PRN
  Administered 2023-07-27: 100 mg via INTRAVENOUS

## 2023-07-27 MED ORDER — TRAZODONE HCL 50 MG PO TABS: 50.0000 mg | ORAL_TABLET | Freq: Every evening | ORAL | Status: DC | PRN

## 2023-07-27 NOTE — Anesthesia Preprocedure Evaluation (Signed)
 Anesthesia Evaluation  Patient identified by MRN, date of birth, ID band Patient awake    Reviewed: Allergy & Precautions, H&P , NPO status , Patient's Chart, lab work & pertinent test results, reviewed documented beta blocker date and time   Airway Mallampati: II  TM Distance: >3 FB Neck ROM: full    Dental no notable dental hx.    Pulmonary shortness of breath   Pulmonary exam normal breath sounds clear to auscultation       Cardiovascular Exercise Tolerance: Good hypertension, + DOE  + dysrhythmias Atrial Fibrillation  Rhythm:regular Rate:Normal     Neuro/Psych  Headaches PSYCHIATRIC DISORDERS  Depression       GI/Hepatic Neg liver ROS, PUD,,,  Endo/Other  diabetes  Class 4 obesity  Renal/GU negative Renal ROS  negative genitourinary   Musculoskeletal   Abdominal   Peds  Hematology negative hematology ROS (+)   Anesthesia Other Findings   Reproductive/Obstetrics negative OB ROS                             Anesthesia Physical Anesthesia Plan  ASA: 3  Anesthesia Plan: General   Post-op Pain Management:    Induction:   PONV Risk Score and Plan: Propofol  infusion  Airway Management Planned:   Additional Equipment:   Intra-op Plan:   Post-operative Plan:   Informed Consent: I have reviewed the patients History and Physical, chart, labs and discussed the procedure including the risks, benefits and alternatives for the proposed anesthesia with the patient or authorized representative who has indicated his/her understanding and acceptance.     Dental Advisory Given  Plan Discussed with: CRNA  Anesthesia Plan Comments:        Anesthesia Quick Evaluation

## 2023-07-27 NOTE — H&P (Addendum)
 Primary Care Physician:  Delfina Feller, FNP Primary Gastroenterologist:  Dr. Alita Irwin  Pre-Procedure History & Physical: HPI:  This is a 73 y.o. female with diabetes hypertension A-fib on Eliquis , AV stenosis , history of PUD with GI bleed (01/2023) underwent EGD found to have Forrest IIC duodenal ulcer is here for surveillance upper endoscopy     Patient reports her stools are now brown in color never black.  Previously she was taking celecoxib  every day that she have stopped and not taking Tylenol  only . The patient denies having any nausea, vomiting, fever, chills, hematochezia, melena, hematemesis, abdominal distention, abdominal pain, diarrhea, jaundice, pruritus or weight loss.   Patient is a lifelong nonalcohol user, denies any herbal medications or family history of liver issues.  Never been told of cirrhosis previously   Last EGD:01/2023   No gross lesions at the esophagus - Erythematous mucosa in the antrum. Biopsied. - Non- bleeding duodenal ulcer with a flat pigmented spot ( Forrest Class IIc) . Injected.    A. STOMACH, BIOPSY:  Gastric antral and oxyntic mucosa with slight chronic inflammation.  Negative for Helicobacter pylori.    Last Colonoscopy:none  COLOGUARD 04/2023  Test Result Negative Negative   FHx: neg for any gastrointestinal/liver disease, no malignancies Social: neg smoking, alcohol or illicit drug use Surgical: no abdominal surgeries  Past Medical History:  Diagnosis Date   A-fib (HCC)    Arthritis    Depression    Diabetes mellitus without complication (HCC)    Dyspnea    Headache    Heart disease    Hyperlipidemia    Hypertension    Hypertension     Past Surgical History:  Procedure Laterality Date   BIOPSY  02/03/2023   Procedure: BIOPSY;  Surgeon: Hargis Lias, MD;  [facility];  Service: Endoscopy;;   CYST EXCISION Left 03/15/2017   from buttocks   ESOPHAGOGASTRODUODENOSCOPY (EGD) WITH PROPOFOL  N/A 02/03/2023    Procedure: ESOPHAGOGASTRODUODENOSCOPY (EGD) WITH PROPOFOL ;  Surgeon: Hargis Lias, MD;  [facility];  Service: Endoscopy;  Laterality: N/A;   ORIF ANKLE FRACTURE Left 04/20/2017   Procedure: OPEN REDUCTION INTERNAL FIXATION (ORIF) LEFT ANKLE FRACTURE;  Surgeon: Winston Hawking, MD;  Location: [facility] OR;  Service: Orthopedics;  Laterality: Left;   SCLEROTHERAPY  02/03/2023   Procedure: SCLEROTHERAPY;  Surgeon: Hargis Lias, MD;  [facility];  Service: Endoscopy;;    Prior to Admission medications   Medication Sig Start Date End Date Taking? Authorizing Provider  Accu-Chek Softclix Lancets lancets TEST BLOOD SUGAR TWICE DAILY Dx E11.9 03/13/23  Yes Gaylyn Keas, Mary-Margaret, FNP  acetaminophen  (TYLENOL ) 500 MG tablet Take 1,000 mg by mouth 2 (two) times daily as needed for moderate pain (pain score 4-6) or headache (sometimes 3 times a day).   Yes [provider]  Alcohol Swabs (DROPSAFE ALCOHOL PREP) 70 % PADS TEST BLOOD SUGAR TWICE DAILY Dx E11.9 03/13/23  Yes Gaylyn Keas, Mary-Margaret, FNP  ALPRAZolam  (XANAX ) 0.25 MG tablet Take 1 tablet (0.25 mg total) by mouth at bedtime. 04/03/23  Yes Martin, Mary-Margaret, FNP  amLODipine  (NORVASC ) 5 MG tablet Take 1 tablet (5 mg total) by mouth daily. 04/03/23  Yes Martin, Mary-Margaret, FNP  Ascorbic Acid (VITAMIN C PO) Take 1 tablet by mouth at bedtime.   Yes [provider]  Blood Glucose Monitoring Suppl (ACCU-CHEK GUIDE ME) w/Device KIT TEST BLOOD SUGAR TWICE DAILY Dx E11.9 12/01/21  Yes Gaylyn Keas, Mary-Margaret, FNP  Cholecalciferol (VITAMIN D-3 PO) Take 1 tablet by  mouth at bedtime.   Yes [provider]  CINNAMON PO Take 1 capsule by mouth daily.    Yes [provider]  cloNIDine  (CATAPRES ) 0.1 MG tablet TAKE 1 TABLET TWICE DAILY 05/25/23  Yes Gaylyn Keas, Mary-Margaret, FNP  furosemide  (LASIX ) 20 MG tablet Take 1 tablet (20 mg total) by mouth daily. 04/03/23  Yes Martin, Mary-Margaret, FNP  Ginger, Zingiber  officinalis, (GINGER ROOT PO) Take 1 capsule by mouth daily.    Yes [provider]  glipiZIDE  (GLUCOTROL ) 5 MG tablet Take 1 tablet (5 mg total) by mouth 2 (two) times daily before a meal. 06/23/23  Yes Gaylyn Keas, Mary-Margaret, FNP  glucose blood (ACCU-CHEK AVIVA PLUS) test strip TEST BLOOD SUGAR TWICE DAILY Dx E11.9 03/13/23  Yes Gaylyn Keas, Mary-Margaret, FNP  lisinopril  (ZESTRIL ) 40 MG tablet TAKE 1 TABLET EVERY DAY 05/25/23  Yes Gaylyn Keas, Mary-Margaret, FNP  metFORMIN  (GLUCOPHAGE -XR) 500 MG 24 hr tablet Take 1 tablet (500 mg total) by mouth daily with breakfast. 03/10/23  Yes Gaylyn Keas, Mary-Margaret, FNP  metoprolol  tartrate (LOPRESSOR ) 50 MG tablet Take 1 tablet (50 mg total) by mouth 2 (two) times daily. 04/03/23  Yes Martin, Mary-Margaret, FNP  Multiple Vitamins-Minerals (ZINC PO) Take 1 tablet by mouth at bedtime.   Yes [provider]  Omega-3 Fatty Acids (FISH OIL PO) Take 1 capsule by mouth daily.   Yes [provider]  pantoprazole  (PROTONIX ) 40 MG tablet Take 1 tablet (40 mg total) by mouth 2 (two) times daily. 05/05/23 05/04/24 Yes Gaylyn Keas, Mary-Margaret, FNP  simvastatin  (ZOCOR ) 40 MG tablet Take 1 tablet (40 mg total) by mouth every evening. 04/03/23  Yes Gaylyn Keas, Mary-Margaret, FNP  sucralfate  (CARAFATE ) 1 g tablet Take 1 tablet (1 g total) by mouth 4 (four) times daily. 02/07/23 02/07/24 Yes Emokpae, Courage, MD  tolterodine  (DETROL  LA) 2 MG 24 hr capsule Take 1 capsule (2 mg total) by mouth daily. 04/03/23  Yes Martin, Mary-Margaret, FNP  TURMERIC CURCUMIN PO Take 1 capsule by mouth daily.   Yes [provider]  albuterol  (VENTOLIN  HFA) 108 (90 Base) MCG/ACT inhaler INHALE 2 PUFFS INTO THE LUNGS EVERY 6 HOURS AS NEEDED FOR WHEEZE OR SHORTNESS OF BREATH 01/11/23   Gaylyn Keas, Mary-Margaret, FNP  apixaban  (ELIQUIS ) 5 MG TABS tablet Take 1 tablet (5 mg total) by mouth 2 (two) times daily. 04/03/23   Delfina Feller, FNP  celecoxib  (CELEBREX ) 200 MG capsule TAKE 1 CAPSULE  TWICE DAILY 05/29/23   Gaylyn Keas, Mary-Margaret, FNP  Ferrous Sulfate (IRON) 325 (65 Fe) MG TABS Take by mouth. Takes two daily    [provider]  polyethylene glycol (MIRALAX  / GLYCOLAX ) 17 g packet Take 17 g by mouth daily as needed for mild constipation or moderate constipation. 02/07/23   Colin Dawley, MD    Allergies as of 06/16/2023 - Review Complete 04/27/2023  Allergen Reaction Noted   Cipro  [ciprofloxacin  hcl] Hives 12/08/2014    Family History  Problem Relation Age of Onset   Diabetes Mother    CAD Mother        CABG at age 16   Hypertension Mother    Hyperlipidemia Mother    Diabetes Father    Heart disease Father        CHF   Hypertension Father    Diabetes Sister    CAD Sister    Hypertension Sister    Diabetes Brother    Hypertension Brother    Healthy Sister     Social History   Socioeconomic History   Marital status:  Widowed    Spouse name: Not on file   Number of children: 2   Years of education: 12   Highest education level: High school graduate  Occupational History   Occupation: School bus Air traffic controller: Therapist, [employer]  Tobacco Use   Smoking status: Never   Smokeless tobacco: Never  Vaping Use   Vaping status: Never Used  Substance and Sexual Activity   Alcohol use: No   Drug use: No   Sexual activity: Not Currently  Other Topics Concern   Not on file  Social History Narrative   Lives alone. Driving bus part time. Daughter lives across the street.   Enjoys gardening and quilting. Physical activity limited due to ankle pain since fx in 2019   Social Drivers of Health   Financial Resource Strain: Low Risk  (07/26/2023)   Overall Financial Resource Strain (CARDIA)    Difficulty of Paying Living Expenses: Not hard at all  Food Insecurity: No Food Insecurity (07/26/2023)   Hunger Vital Sign    Worried About Running Out of Food in the Last Year: Never true    Ran Out of Food in the Last Year: Never true  Transportation  Needs: No Transportation Needs (07/26/2023)   PRAPARE - Administrator, Civil Service (Medical): No    Lack of Transportation (Non-Medical): No  Physical Activity: Insufficiently Active (07/26/2023)   Exercise Vital Sign    Days of Exercise per Week: 3 days    Minutes of Exercise per Session: 10 min  Stress: No Stress Concern Present (07/26/2023)   [employer] of Occupational Health - Occupational Stress Questionnaire    Feeling of Stress: Not at all  Social Connections: Moderately Isolated (07/26/2023)   Social Connection and Isolation Panel    Frequency of Communication with Friends and Family: More than three times a week    Frequency of Social Gatherings with Friends and Family: More than three times a week    Attends Religious Services: More than 4 times per year    Active Member of [employer] or Organizations: No    Attends Banker Meetings: Never    Marital Status: Widowed  Intimate Partner Violence: Not At Risk (07/26/2023)   Humiliation, Afraid, Rape, and Kick questionnaire    Fear of Current or Ex-Partner: No    Emotionally Abused: No    Physically Abused: No    Sexually Abused: No    Review of Systems: See HPI, otherwise negative ROS  Physical Exam: Vital signs in last 24 hours: Temp:  [98.2 F (36.8 C)] 98.2 F (36.8 C) (06/19 0648) Pulse Rate:  [53-64] 64 (06/19 0648) Resp:  [14] 14 (06/19 0648) BP: (132-144)/(64-68) 144/64 (06/19 0648) SpO2:  [98 %] 98 % (06/19 0648) Weight:  [117.9 kg] 117.9 kg (06/19 0648)   General:   Alert,  Well-developed, well-nourished, pleasant and cooperative in NAD Head:  Normocephalic and atraumatic. Eyes:  Sclera clear, no icterus.   Conjunctiva pink. Ears:  Normal auditory acuity. Nose:  No deformity, discharge,  or lesions. Msk:  Symmetrical without gross deformities. Normal posture. Extremities:  Without clubbing or edema. Neurologic:  Alert and  oriented x4;  grossly normal neurologically. Skin:   Intact without significant lesions or rashes. Psych:  Alert and cooperative. Normal mood and affect.  Impression/Plan:   This is a 73 y.o. female with diabetes hypertension A-fib on Eliquis , AV stenosis , history of PUD with GI bleed (01/2023) underwent EGD found to have  Forrest IIC duodenal ulcer is here for surveillance upper endoscopy   The risks of the procedure including infection, bleed, or perforation as well as benefits, limitations, alternatives and imponderables have been reviewed with the patient. Questions have been answered. All parties agreeable.

## 2023-07-27 NOTE — H&P (Signed)
 History and Physical    Patient: Ashley [name] VWU:[id number] DOB: 09/08/1950 DOA: 07/27/2023 DOS: the patient was seen and examined on 07/27/2023 PCP: Delfina Feller, FNP  Patient coming from: Home  Chief Complaint: GI bleed found on EGD  HPI: [name] is a 73 y.o. female with medical history significant for DM2, HTN, PAFIB on chronic anticoagulation with Eliquis , prior history of GI bleeding for extensive duodenal ulcer and history of aortic valve stenosis presented for outpatient follow-up/surveillance EGD on 07/27/2023-- --outpatient EGD today on 07/27/2023 showed-- Normal esophagus. 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 PURASTAT applied. Treated with bipolar cautery. Clip ( MR conditional) was placed. Clip manufacturer: [employer]. Biopsied. Exposed blood vessel seen upon bipolar cautery . Complete Hemostatis achieved at the end of the procedure - Normal duodenal bulb and second portion of the duodenum. -- Dr. Alita Irwin from GI team recommended observation overnight in the hospital due to high risk for rebleeding--Given treatment of exposed blood vessel with 3 different modalities , high risk of rebleed and patient being on anticoagulation  -Repeat serial H&H,   - Patient denies NSAID use patient states his stools were actually brown prior to admission - Denies chest pain palpitation dizziness, no nausea no vomiting, no diarrhea, no abdominal pain No fever  Or chills  -- She actually feels pretty good overall -CBC pending -Pathology from EGD biopsies pending   Review of Systems: As mentioned in the history of present illness. All other systems reviewed and are negative. Past Medical History:  Diagnosis Date   A-fib [facility])    Arthritis    Depression    Diabetes mellitus without complication (HCC)    Dyspnea    Headache    Heart disease    Hyperlipidemia    Hypertension    Hypertension    Past Surgical History:  Procedure  Laterality Date   BIOPSY  02/03/2023   Procedure: BIOPSY;  Surgeon: Hargis Lias, MD;  [facility];  Service: Endoscopy;;   CYST EXCISION Left 03/15/2017   from buttocks   ESOPHAGOGASTRODUODENOSCOPY (EGD) WITH PROPOFOL  N/A 02/03/2023   Procedure: ESOPHAGOGASTRODUODENOSCOPY (EGD) WITH PROPOFOL ;  Surgeon: Hargis Lias, MD;  [facility];  Service: Endoscopy;  Laterality: N/A;   ORIF ANKLE FRACTURE Left 04/20/2017   Procedure: OPEN REDUCTION INTERNAL FIXATION (ORIF) LEFT ANKLE FRACTURE;  Surgeon: Winston Hawking, MD;  Location: [facility] OR;  Service: Orthopedics;  Laterality: Left;   SCLEROTHERAPY  02/03/2023   Procedure: SCLEROTHERAPY;  Surgeon: Hargis Lias, MD;  [facility];  Service: Endoscopy;;   Social History:  reports that she has never smoked. She has never used smokeless tobacco. She reports that she does not drink alcohol and does not use drugs.  Allergies  Allergen Reactions   Iodine Dermatitis   Cipro  [Ciprofloxacin  Hcl] Hives    Family History  Problem Relation Age of Onset   Diabetes Mother    CAD Mother        CABG at age 77   Hypertension Mother    Hyperlipidemia Mother    Diabetes Father    Heart disease Father        CHF   Hypertension Father    Diabetes Sister    CAD Sister    Hypertension Sister    Diabetes Brother    Hypertension Brother    Healthy Sister     Prior to Admission medications   Medication Sig Start Date End Date Taking?  Authorizing Provider  ALPRAZolam  (XANAX ) 0.25 MG tablet Take 1 tablet (0.25 mg total) by mouth at bedtime. 04/03/23  Yes Martin, Mary-Margaret, FNP  amLODipine  (NORVASC ) 5 MG tablet Take 1 tablet (5 mg total) by mouth daily. 04/03/23  Yes Martin, Mary-Margaret, FNP  ascorbic acid (VITAMIN C) 500 MG tablet Take 500 mg by mouth at bedtime.   Yes [provider]  Cholecalciferol (VITAMIN D-3 PO) Take 1 capsule by mouth at bedtime.   Yes [provider]  CINNAMON PO Take  1 capsule by mouth daily.   Yes [provider]  cloNIDine  (CATAPRES ) 0.1 MG tablet TAKE 1 TABLET TWICE DAILY 05/25/23  Yes Gaylyn Keas, Mary-Margaret, FNP  furosemide  (LASIX ) 20 MG tablet Take 20 mg by mouth daily.   Yes [provider]  glipiZIDE  (GLUCOTROL ) 5 MG tablet Take 5 mg by mouth 2 (two) times daily before a meal.   Yes [provider]  lisinopril  (ZESTRIL ) 40 MG tablet Take 40 mg by mouth daily.   Yes [provider]  metFORMIN  (GLUCOPHAGE -XR) 500 MG 24 hr tablet Take 500 mg by mouth daily with breakfast.   Yes [provider]  metoprolol  tartrate (LOPRESSOR ) 50 MG tablet Take 1 tablet (50 mg total) by mouth 2 (two) times daily. 04/03/23  Yes Gaylyn Keas, Mary-Margaret, FNP  Omega-3 Fatty Acids (FISH OIL PO) Take 1 capsule by mouth daily.   Yes [provider]  pantoprazole  (PROTONIX ) 40 MG tablet Take 40 mg by mouth 2 (two) times daily before a meal.   Yes [provider]  simvastatin  (ZOCOR ) 40 MG tablet Take 1 tablet (40 mg total) by mouth every evening. 04/03/23  Yes Gaylyn Keas, Mary-Margaret, FNP  sucralfate  (CARAFATE ) 1 g tablet Take 1 tablet (1 g total) by mouth 4 (four) times daily. Patient taking differently: Take 1 g by mouth 2 (two) times daily. 02/07/23 02/07/24 Yes Ameri Cahoon, MD  tolterodine  (DETROL  LA) 2 MG 24 hr capsule Take 2 mg by mouth daily.   Yes [provider]  Turmeric (QC TUMERIC COMPLEX PO) Take 1 capsule by mouth daily.   Yes [provider]  zinc gluconate 50 MG tablet Take 50 mg by mouth at bedtime.   Yes [provider]  apixaban  (ELIQUIS ) 5 MG TABS tablet Take 1 tablet (5 mg total) by mouth 2 (two) times daily. 04/03/23   Gaylyn Keas Mary-Margaret, FNP  ferrous sulfate 325 (65 FE) MG tablet Take 325 mg by mouth daily with breakfast.    [provider]    Physical Exam: Vitals:   07/27/23 0900 07/27/23 1100 07/27/23 1200 07/27/23 1322  BP: (!) 161/72 (!) 130/51 (!) 152/72  (!) 144/71  Pulse: (!) 52 (!) 58 (!) 57 60  Resp:      Temp:  97.7 F (36.5 C)  98.3 F (36.8 C)  TempSrc:    Oral  SpO2: 98% 97% 99% 98%  Weight:      Height:        Physical Exam  Gen:- Awake Alert, in no acute distress, Morbidly obese, no acute distress HEENT:- [place].AT, No sclera icterus Neck-Supple Neck,No JVD,.  Lungs-  CTAB , fair air movement bilaterally  CV- S1, S2 normal, irregular Abd-  +ve B.Sounds, Abd Soft, No significant tenderness, increased truncal adiposity noted Extremity/Skin:- No  edema,   good pedal pulses  Psych-affect is appropriate, oriented x3 Neuro-no new focal deficits, no tremors  Data Reviewed: -CBC pending -Pathology from EGD biopsies pending   Assessment and Plan: 1)Acute GI  bleed-- Patient initially presented for outpatient follow-up/surveillance EGD on 07/27/2023-- --outpatient EGD today on 07/27/2023 showed-- Normal esophagus. 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 PURASTAT applied. Treated with bipolar cautery. Clip ( MR conditional) was placed. Clip manufacturer: [employer]. Biopsied. Exposed blood vessel seen upon bipolar cautery . Complete Hemostatis achieved at the end of the procedure - Normal duodenal bulb and second portion of the duodenum. -- Dr. Alita Irwin from GI team recommended observation overnight in the hospital due to high risk for rebleeding--Given treatment of exposed blood vessel with 3 different modalities , high risk of rebleed and patient being on anticoagulation  -Repeat serial H&H,  - Patient denies NSAID use patient states his stools were actually brown prior to admission - Denies chest pain palpitation dizziness, no nausea no vomiting, no diarrhea, no abdominal pain -- She actually feels pretty good overall - CBC ordered for later today -Give Carafate  --IV Protonix  as ordered - Clear liquid diet for now  2)PAFib--- hold Eliquis  for at least 24 hours may restart if no bleeding - Continue  metoprolol  for rate control  3)DM2-A1c 6.7 reflecting excellent diabetic control PTA - Hold PTA metformin  and glipizide  Use Novolog /Humalog Sliding scale insulin  with Accu-Cheks/Fingersticks as ordered   4)HTN (hypertension) Stable --Continue amlodipine  and clonidine  as well as metoprolol  for BP control -Hold Lasix   5)Morbid Obesity- -Low calorie diet, portion control and increase physical activity discussed with patient -Body mass index is 46.06 kg/m.     Advance Care Planning:   Code Status: Full Code   Consults: Gi   Family Communication: GI team spoke with patient's daughter Pattie Borders after EGD  Severity of Illness: The appropriate patient status for this patient is OBSERVATION. Observation status is judged to be reasonable and necessary in order to provide the required intensity of service to ensure the patient's safety. The patient's presenting symptoms, physical exam findings, and initial radiographic and laboratory data in the context of their medical condition is felt to place them at decreased risk for further clinical deterioration. Furthermore, it is anticipated that the patient will be medically stable for discharge from the hospital within 2 midnights of admission.   Author: Colin Dawley, MD 07/27/2023 4:52 PM  For on call review [email or web address].

## 2023-07-27 NOTE — Progress Notes (Signed)
 This RN received call from central telemetry about this patients heart rate sustaining in the 30's. Upon entering the room, patient was sleeping but aroused with sound. Patient oriented X4 with no new complaints of any symptoms. Vitals obtained with dinamap and all other vitals measuring WNL. MD notified and RN awaiting orders.

## 2023-07-27 NOTE — Progress Notes (Signed)
 Patient moved to phase ii area to wait for admission.  NAD.

## 2023-07-27 NOTE — Brief Op Note (Signed)
 Patient underwent EGD with control of bleeding under propofol  sedation.  Tolerated the procedure adequately.   FINDINGS:  - Normal esophagus. 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 PURASTAT applied. Treated with bipolar cautery. Clip ( MR conditional) was placed. Clip manufacturer: [employer]. Biopsied. Exposed blood vessel seen upon bipolar cautery . Complete Hemostatis achieved at the end of the procedure - Normal duodenal bulb and second portion of the duodenum.  RECOMMENDATIONS  - Given treatment of exposed blood vessel with 3 different modalities , high risk of rebleed and patient being on anticoagulation ; advised observtaion in the hospital   - repeat CBC q 8 hours   - PPI IV BID today and then switch to PO tomorrow BID for atleast 8 weeks   - If hemoglobin stable by morning , can restart anticoagulation while patient is also closely monitored   - Clear liquid diet today.   - Continue present medications.   - Await pathology results. - Repeat upper endoscopy in 8 weeks for surveillance.   - Spoke to the patient and daughter Cathryne Cobble, Heather)  Lura Falor Faizan Gloriana Piltz, MD Gastroenterology and Hepatology [facility] Gastroenterology

## 2023-07-27 NOTE — Anesthesia Procedure Notes (Signed)
 Date/Time: 07/27/2023 7:31 AM  Performed by: Sherwin Donate, CRNAPre-anesthesia Checklist: Patient identified, Emergency Drugs available, Suction available and Patient being monitored Patient Re-evaluated:Patient Re-evaluated prior to induction Oxygen Delivery Method: Nasal cannula Induction Type: IV induction Placement Confirmation: positive ETCO2 Comments: Optiflow [place] [employer] [place] O2 used.

## 2023-07-27 NOTE — Transfer of Care (Signed)
 Immediate Anesthesia Transfer of Care Note  Patient: Ashley [name]  Procedure(s) Performed: EGD (ESOPHAGOGASTRODUODENOSCOPY)  Patient Location: PACU  Anesthesia Type:General  Level of Consciousness: awake  Airway & Oxygen Therapy: Patient Spontanous Breathing  Post-op Assessment: Report given to RN and Post -op Vital signs reviewed and stable  Post vital signs: Reviewed and stable  Last Vitals:  Vitals Value Taken Time  BP    Temp    Pulse    Resp    SpO2      Last Pain:  Vitals:   07/27/23 0733  TempSrc:   PainSc: 8          Complications: No notable events documented.

## 2023-07-27 NOTE — Op Note (Addendum)
 [facility] Patient Name: Ashley [name] Procedure Date: 07/27/2023 7:06 AM MRN: [id number] Date of Birth: [birth year] Attending MD: Terril Fetters , MD, [id number] CSN: [id number] Age: 73 Admit Type: Outpatient Procedure:                Upper GI endoscopy Indications:              Follow-up of duodenal ulcer with hemorrhage Providers:                Terril Fetters, MD, Willena Harp, Sharlette Dayhoff                            Technician, Technician Referring MD:              Medicines:                Monitored Anesthesia Care Complications:            No immediate complications. Estimated Blood Loss:     Estimated blood loss was minimal. Procedure:                Pre-Anesthesia Assessment:                           - Prior to the procedure, a History and Physical                            was performed, and patient medications and                            allergies were reviewed. The patient's tolerance of                            previous anesthesia was also reviewed. The risks                            and benefits of the procedure and the sedation                            options and risks were discussed with the patient.                            All questions were answered, and informed consent                            was obtained. Prior Anticoagulants: The patient has                            taken no anticoagulant or antiplatelet agents. ASA                            Grade Assessment: III - A patient with severe                            systemic disease. After reviewing the risks and  benefits, the patient was deemed in satisfactory                            condition to undergo the procedure.                           After obtaining informed consent, the endoscope was                            passed under direct vision. Throughout the                            procedure, the patient's blood pressure, pulse, and                             oxygen saturations were monitored continuously. The                            [id number]-[id number] ([id number]) scope was introduced through the                            mouth, and advanced to the second part of duodenum.                            The upper GI endoscopy was accomplished without                            difficulty. The patient tolerated the procedure                            well. Scope In: 7:38:57 AM Scope Out: 7:58:15 AM Total Procedure Duration: 0 hours 19 minutes 18 seconds  Findings:      The examined esophagus was normal.      Hemorrhagic mucosa with bleeding was found in the gastric antrum. Area       was successfully injected with 3 mL PURASTAT for hemostasis. Coagulation       for hemostasis using bipolar probe was successful. For hemostasis, one       hemostatic clip was successfully placed (MR conditional). Clip       manufacturer: [employer]. There was no bleeding at the end of the       procedure. Biopsies were taken with a cold forceps for histology.      The duodenal bulb and second portion of the duodenum were normal. Impression:               - Normal esophagus.                           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 PURASTAT applied. Treated  with bipolar cautery. Clip (MR conditional) was                            placed. Clip manufacturer: [employer].                            Biopsied. Exposed blood vessel seen upon bipolar                            cautery .Complete Hemostatis achieved at the end of                            the procedure                           - Normal duodenal bulb and second portion of the                            duodenum. Moderate Sedation:      Per Anesthesia Care Recommendation:           - Given treatment of exposed blood vessel  with 3                            different modalities , high risk of rebleed and                            patient being on anticoagulation ; advised                            observtaion in the hospital                           -repeat CBC q 8 hours                           -PPI IV BID today and then switch to PO tomorrow                            BID for atleast 8 weeeks                           -If hemoglobin stable by morning , can restart                            anticoagulation while patient is also closely                            monitored                           -Clear liquid diet today.                           - Continue present medications.                           -  Await pathology results.                           - Repeat upper endoscopy in 8 weeks for                            surveillance.                           - Spoke to the patient and daughter Air traffic controller) Procedure Code(s):        --- Professional ---                           [phone], 59, Esophagogastroduodenoscopy, flexible,                            transoral; with control of bleeding, any method                           43239, Esophagogastroduodenoscopy, flexible,                            transoral; with biopsy, single or multiple Diagnosis Code(s):        --- Professional ---                           K92.2, Gastrointestinal hemorrhage, unspecified                           K26.4, Chronic or unspecified duodenal ulcer with                            hemorrhage CPT copyright 2022 American Medical Association. All rights reserved. The codes documented in this report are preliminary and upon coder review may  be revised to meet current compliance requirements. Terril Fetters, MD Terril Fetters, MD 07/27/2023 8:11:04 AM This report has been signed electronically. Number of Addenda: 0

## 2023-07-28 ENCOUNTER — Encounter (HOSPITAL_COMMUNITY): Payer: Self-pay | Admitting: Gastroenterology

## 2023-07-28 ENCOUNTER — Other Ambulatory Visit: Payer: Self-pay | Admitting: Gastroenterology

## 2023-07-28 ENCOUNTER — Telehealth: Payer: Self-pay | Admitting: Gastroenterology

## 2023-07-28 DIAGNOSIS — K922 Gastrointestinal hemorrhage, unspecified: Secondary | ICD-10-CM

## 2023-07-28 DIAGNOSIS — K3189 Other diseases of stomach and duodenum: Secondary | ICD-10-CM | POA: Diagnosis not present

## 2023-07-28 DIAGNOSIS — I10 Essential (primary) hypertension: Secondary | ICD-10-CM | POA: Diagnosis not present

## 2023-07-28 DIAGNOSIS — I4891 Unspecified atrial fibrillation: Secondary | ICD-10-CM | POA: Diagnosis not present

## 2023-07-28 DIAGNOSIS — R0609 Other forms of dyspnea: Secondary | ICD-10-CM | POA: Diagnosis not present

## 2023-07-28 DIAGNOSIS — R0602 Shortness of breath: Secondary | ICD-10-CM | POA: Diagnosis not present

## 2023-07-28 DIAGNOSIS — Z8711 Personal history of peptic ulcer disease: Secondary | ICD-10-CM | POA: Diagnosis not present

## 2023-07-28 DIAGNOSIS — R519 Headache, unspecified: Secondary | ICD-10-CM | POA: Diagnosis not present

## 2023-07-28 DIAGNOSIS — K921 Melena: Secondary | ICD-10-CM

## 2023-07-28 DIAGNOSIS — D500 Iron deficiency anemia secondary to blood loss (chronic): Secondary | ICD-10-CM

## 2023-07-28 DIAGNOSIS — K274 Chronic or unspecified peptic ulcer, site unspecified, with hemorrhage: Secondary | ICD-10-CM

## 2023-07-28 DIAGNOSIS — F32A Depression, unspecified: Secondary | ICD-10-CM | POA: Diagnosis not present

## 2023-07-28 LAB — BASIC METABOLIC PANEL WITH GFR
Anion gap: 6 (ref 5–15)
BUN: 11 mg/dL (ref 8–23)
CO2: 26 mmol/L (ref 22–32)
Calcium: 8.8 mg/dL — ABNORMAL LOW (ref 8.9–10.3)
Chloride: 107 mmol/L (ref 98–111)
Creatinine, Ser: 0.54 mg/dL (ref 0.44–1.00)
GFR, Estimated: >60 mL/min (ref >60)
Glucose, Bld: 171 mg/dL — ABNORMAL HIGH (ref 70–99)
Potassium: 3.9 mmol/L (ref 3.5–5.1)
Sodium: 139 mmol/L (ref 135–145)

## 2023-07-28 LAB — CBC
HCT: 36.3 % (ref 36.0–46.0)
Hemoglobin: 11.4 g/dL — ABNORMAL LOW (ref 12.0–15.0)
MCH: 28.3 pg (ref 26.0–34.0)
MCHC: 31.4 g/dL (ref 30.0–36.0)
MCV: 90.1 fL (ref 80.0–100.0)
Platelets: 163 10*3/uL (ref 150–400)
RBC: 4.03 MIL/uL (ref 3.87–5.11)
RDW: 18.5 % — ABNORMAL HIGH (ref 11.5–15.5)
WBC: 5.3 10*3/uL (ref 4.0–10.5)
nRBC: 0 % (ref 0.0–0.2)

## 2023-07-28 LAB — GLUCOSE, CAPILLARY
Glucose-Capillary: 177 mg/dL — ABNORMAL HIGH (ref 70–99)
Glucose-Capillary: 249 mg/dL — ABNORMAL HIGH (ref 70–99)

## 2023-07-28 MED ORDER — PANTOPRAZOLE SODIUM 40 MG PO TBEC
40.0000 mg | DELAYED_RELEASE_TABLET | Freq: Two times a day (BID) | ORAL | 5 refills | Status: DC
Start: 2023-07-28 — End: 2023-10-31

## 2023-07-28 MED ORDER — SUCRALFATE 1 G PO TABS: 1.0000 g | ORAL_TABLET | Freq: Three times a day (TID) | ORAL | 1 refills | Status: DC

## 2023-07-28 MED ORDER — POLYETHYLENE GLYCOL 3350 17 G PO PACK: 17.0000 g | PACK | Freq: Every day | ORAL | 1 refills | Status: AC | PRN

## 2023-07-28 NOTE — Progress Notes (Signed)
 Patient doing well this morning. Was getting washed up when I arrived to her room. I advised her that given labs were stable we would restart her Eliquis . She has not yet had a BM given not much oral intake. Denies any pain, lightheadedness, dizziness while walking.   We discussed monitoring for any bleeding when bowel function returns on discharge. We discussed ED precautions. I also let her know that orders for [facility] for CBC were placed that she needs to have this performed on Monday. She is to call hospital operator over the weekend to get in touch with Dr. Alita Irwin if she has any concerns or signs of bleeding. She will need to continue PPI BID orally at home. Our office will plan of repeat EGD in about 8 weeks.   These recommendations were relayed to hospitalist.   Julian Obey, MSN, APRN, FNP-BC, AGACNP-BC [facility] at [facility]

## 2023-07-28 NOTE — Telephone Encounter (Signed)
 Per Dr. Alita Irwin she is in need of repeat EGD in about 8 weeks. ASA 3 - need clearance to hold eliquis  for 48 hours -needs to hold oral diabetes medication night prior to and morning of procedure.   Ashley [name] - She should have a follow up in the office in 3-4 weeks with Dr. Alita Irwin or Joie Narrow if no availability with Dr. Alita Irwin (only app she has seen in the hospital, in Dec).   Julian Obey, MSN, APRN, FNP-BC, AGACNP-BC [facility] at [facility]

## 2023-07-28 NOTE — Discharge Instructions (Signed)
 1)Watch for bleeding while on Blood Thinners--watch for blood in your stool which can make your stool black, maroon, mahogany or red---, blood in your urine which can make your urine pink or red, nosebleeds , also watch for possible bruising -You are taking Apixaban /Eliquis --- which is a blood thinner--- be careful to avoid injury or falls  2)Avoid ibuprofen/Advil/Aleve/Motrin/Goody Powders/Naproxen/BC powders/Meloxicam /Diclofenac /Indomethacin and other Nonsteroidal anti-inflammatory medications as these will make you more likely to bleed and can cause stomach ulcers, can also cause Kidney problems.   3)Repeat CBC Blood Test on Monday 07/31/2023--  4)Please Follow up with Gastroenterologist Dr. Ahmed--next week for recheck as advised - address [address], [address], [place] [address],,[place] Number [phone]

## 2023-07-28 NOTE — Care Management Obs Status (Signed)
 MEDICARE OBSERVATION STATUS NOTIFICATION   Patient Details  Name: Ashley [name] MRN: [id number] Date of Birth: 02/17/1950   Medicare Observation Status Notification Given:  Yes    Neila Bally 07/28/2023, 10:03 AM

## 2023-07-28 NOTE — TOC CM/SW Note (Signed)
 Transition of Care [facility]) - Inpatient Brief Assessment   Patient Details  Name: Ashley [name] MRN: [id number] Date of Birth: February 27, 1950  Transition of Care [facility]) CM/SW Contact:    Grandville Lax, LCSWA Phone Number: 07/28/2023, 9:26 AM   Clinical Narrative: Transition of Care Department [facility]) has reviewed patient and no TOC needs have been identified at this time. We will continue to monitor patient advancement through interdiciplinary progression rounds. If new patient transition needs arise, please place a TOC consult.  Transition of Care Asessment: Insurance and Status: Insurance coverage has been reviewed Patient has primary care physician: Yes Home environment has been reviewed: From home Prior level of function:: Independent Prior/Current Home Services: No current home services Social Drivers of Health Review: SDOH reviewed no interventions necessary Readmission risk has been reviewed: Yes Transition of care needs: no transition of care needs at this time

## 2023-07-28 NOTE — Plan of Care (Signed)

## 2023-07-28 NOTE — Progress Notes (Signed)
 This RN received a call from central telemetry of patient having a 3.36 second pause in her heart rate. MD notified.

## 2023-07-28 NOTE — Discharge Summary (Signed)
 Ashley [name], is a 73 y.o. female  DOB 1951/02/04  MRN [id number].  Admission date:  07/27/2023  Admitting Physician  Rendall Carwin, MD  Discharge Date:  07/28/2023   Primary MD  Gladis Mustard, FNP  Recommendations for primary care physician for things to follow:  1)Watch for bleeding while on Blood Thinners--watch for blood in your stool which can make your stool black, maroon, mahogany or red---, blood in your urine which can make your urine pink or red, nosebleeds , also watch for possible bruising -You are taking Apixaban /Eliquis --- which is a blood thinner--- be careful to avoid injury or falls  2)Avoid ibuprofen/Advil/Aleve/Motrin/Goody Powders/Naproxen/BC powders/Meloxicam /Diclofenac /Indomethacin and other Nonsteroidal anti-inflammatory medications as these will make you more likely to bleed and can cause stomach ulcers, can also cause Kidney problems.   3)Repeat CBC Blood Test on Monday 07/31/2023--  4)Please Follow up with Gastroenterologist Dr. Ahmed--next week for recheck as advised - address 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   Gastritis and gastroduodenitis      Past Medical History:  Diagnosis Date   A-fib (HCC)    Arthritis    Depression    Diabetes mellitus without complication (HCC)    Dyspnea    Headache    Heart disease    Hyperlipidemia    Hypertension    Hypertension     Past Surgical History:  Procedure Laterality Date   BIOPSY  02/03/2023   Procedure: BIOPSY;  Surgeon: Cinderella Deatrice FALCON, MD;  [facility];  Service: Endoscopy;;   CYST EXCISION Left 03/15/2017   from buttocks    ESOPHAGOGASTRODUODENOSCOPY (EGD) WITH PROPOFOL  N/A 02/03/2023   Procedure: ESOPHAGOGASTRODUODENOSCOPY (EGD) WITH PROPOFOL ;  Surgeon: Cinderella Deatrice FALCON, MD;  [facility];  Service: Endoscopy;  Laterality: N/A;   ORIF ANKLE FRACTURE Left 04/20/2017   Procedure: OPEN REDUCTION INTERNAL FIXATION (ORIF) LEFT ANKLE FRACTURE;  Surgeon: Kay Kemps, MD;  Location: [facility];  Service: Orthopedics;  Laterality: Left;   SCLEROTHERAPY  02/03/2023   Procedure: SCLEROTHERAPY;  Surgeon: Cinderella Deatrice FALCON, MD;  Location: [facility];  Service: Endoscopy;;       HPI  from the history and physical done on the day of admission:    Chief Complaint: GI bleed found on EGD   HPI: Ashley [name] is a 73 y.o. female with medical history significant for DM2, HTN, PAFIB on chronic anticoagulation with Eliquis , prior history of GI bleeding for extensive duodenal ulcer and history of aortic valve stenosis presented for outpatient follow-up/surveillance EGD on 07/27/2023-- --outpatient EGD today on 07/27/2023 showed-- Normal esophagus. 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 PURASTAT applied. Treated with bipolar cautery. Clip ( MR conditional) was placed. Clip manufacturer: [employer]. Biopsied. Exposed blood vessel seen  upon bipolar cautery . Complete Hemostatis achieved at the end of the procedure - Normal duodenal bulb and second portion of the duodenum. -- Dr. Cinderella from GI team recommended observation overnight in the hospital due to high risk for rebleeding--Given treatment of exposed blood vessel with 3 different modalities , high risk of rebleed and patient being on anticoagulation  -Repeat serial H&H,   - Patient denies NSAID use patient states his stools were actually brown prior to admission - Denies chest pain palpitation dizziness, no nausea no vomiting, no diarrhea, no abdominal pain No fever  Or chills  -- She actually feels pretty good overall -CBC  pending -Pathology from EGD biopsies pending   Hospital Course:     1)Acute GI bleed-- Patient initially presented for outpatient follow-up/surveillance EGD on 07/27/2023-- --outpatient EGD today on 07/27/2023 showed-- Normal esophagus. 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 PURASTAT applied. Treated with bipolar cautery. Clip ( MR conditional) was placed. Clip manufacturer: [employer]. Biopsied. Exposed blood vessel seen upon bipolar cautery . Complete Hemostatis achieved at the end of the procedure - Normal duodenal bulb and second portion of the duodenum. -- Dr. Cinderella from GI team recommended observation overnight in the hospital due to high risk for rebleeding--Given treatment of exposed blood vessel with 3 different modalities , high risk of rebleed and patient being on anticoagulation  - - Patient denies NSAID use patient states his stools were actually brown prior to admission - Denies chest pain palpitation dizziness, no nausea no vomiting, no diarrhea, no abdominal pain -- She actually feels pretty good overall - Discussed with GI team -Hgb currently above 11 -Okay to discharge home on Protonix  -Follow-up with GI team for repeat CBC next week -Repeat EGD to be scheduled for about 8 weeks from now by GI team   2)PAFib--- per GI team okay to restart Eliquis  for stroke prophylaxis - Continue metoprolol  for rate control   3)DM2-A1c 6.7 reflecting excellent diabetic control PTA - Okay to restart PTA metformin  and glipizide    4)HTN (hypertension) Stable --Continue amlodipine  and clonidine  as well as metoprolol  for BP contro   5)Morbid Obesity- -Low calorie diet, portion control and increase physical activity discussed with patient -Body mass index is 46.06 kg/m.     Discharge Condition: Stable,  Follow UP   Follow-up Information     Ahmed, Deatrice FALCON, MD. Schedule an appointment as soon as possible for a visit in 1 week(s).   Specialty:  Gastroenterology Contact information: [address] [place] [address] [phone]                  Consults obtained -discussed with GI team  Diet and Activity recommendation:  As advised  Discharge Instructions    Discharge Instructions     Call MD for:  difficulty breathing, headache or visual disturbances   Complete by: As directed    Call MD for:  persistant dizziness or light-headedness   Complete by: As directed    Call MD for:  persistant nausea and vomiting   Complete by: As directed    Call MD for:  temperature >100.4   Complete by: As directed    Diet - low sodium heart healthy   Complete by: As directed    Discharge instructions   Complete by: As directed    1)Watch for bleeding while on Blood Thinners--watch for blood in your stool which can make your stool black, maroon, mahogany or red---, blood  in your urine which can make your urine pink or red, nosebleeds , also watch for possible bruising -You are taking Apixaban /Eliquis --- which is a blood thinner--- be careful to avoid injury or falls  2)Avoid ibuprofen/Advil/Aleve/Motrin/Goody Powders/Naproxen/BC powders/Meloxicam /Diclofenac /Indomethacin and other Nonsteroidal anti-inflammatory medications as these will make you more likely to bleed and can cause stomach ulcers, can also cause Kidney problems.   3)Repeat CBC Blood Test on Monday 07/31/2023--  4)Please Follow up with Gastroenterologist Dr. Ahmed--next week for recheck as advised - address [address], [place] [address],,[place] Number [phone]   Increase activity slowly   Complete by: As directed         Discharge Medications     Allergies as of 07/28/2023       Reactions   Iodine Dermatitis   Cipro  [ciprofloxacin  Hcl] Hives        Medication List     TAKE these medications    ALPRAZolam  0.25 MG tablet Commonly known as: XANAX  Take 1 tablet (0.25 mg total) by mouth at bedtime.   amLODipine  5  MG tablet Commonly known as: NORVASC  Take 1 tablet (5 mg total) by mouth daily.   apixaban  5 MG Tabs tablet Commonly known as: Eliquis  Take 1 tablet (5 mg total) by mouth 2 (two) times daily.   ascorbic acid 500 MG tablet Commonly known as: VITAMIN C Take 500 mg by mouth at bedtime.   CINNAMON PO Take 1 capsule by mouth daily.   cloNIDine  0.1 MG tablet Commonly known as: CATAPRES  TAKE 1 TABLET TWICE DAILY   ferrous sulfate 325 (65 FE) MG tablet Take 325 mg by mouth daily with breakfast.   FISH OIL PO Take 1 capsule by mouth daily.   furosemide  20 MG tablet Commonly known as: LASIX  Take 20 mg by mouth daily.   glipiZIDE  5 MG tablet Commonly known as: GLUCOTROL  Take 5 mg by mouth 2 (two) times daily before a meal.   lisinopril  40 MG tablet Commonly known as: ZESTRIL  Take 40 mg by mouth daily.   metFORMIN  500 MG 24 hr tablet Commonly known as: GLUCOPHAGE -XR Take 500 mg by mouth daily with breakfast.   metoprolol  tartrate 50 MG tablet Commonly known as: LOPRESSOR  Take 1 tablet (50 mg total) by mouth 2 (two) times daily.   pantoprazole  40 MG tablet Commonly known as: PROTONIX  Take 1 tablet (40 mg total) by mouth 2 (two) times daily before a meal.   polyethylene glycol 17 g packet Commonly known as: MIRALAX  / GLYCOLAX  Take 17 g by mouth daily as needed for mild constipation or moderate constipation.   QC TUMERIC COMPLEX PO Take 1 capsule by mouth daily.   simvastatin  40 MG tablet Commonly known as: ZOCOR  Take 1 tablet (40 mg total) by mouth every evening.   sucralfate  1 g tablet Commonly known as: Carafate  Take 1 tablet (1 g total) by mouth 4 (four) times daily -  with meals and at bedtime. What changed: when to take this   tolterodine  2 MG 24 hr capsule Commonly known as: DETROL  LA Take 2 mg by mouth daily.   VITAMIN D-3 PO Take 1 capsule by mouth at bedtime.   zinc gluconate 50 MG tablet Take 50 mg by mouth at bedtime.       Major procedures  and Radiology Reports - PLEASE review detailed and final reports for all details, in brief -   US  Abdomen Complete Result Date: 07/21/2023 CLINICAL DATA:  Cirrhosis EXAM: ABDOMEN ULTRASOUND COMPLETE COMPARISON:  Contrast CT 02/02/2023 FINDINGS: Gallbladder: Distended gallbladder with some layering sludge. No shadowing stones. No wall thickening or adjacent fluid. Common bile duct: Diameter: 2 mm Liver: Nodular echogenic appearing liver consistent with known chronic liver disease. No space-occupying liver lesion identified on this limited ultrasound. Portions of the liver obscured by overlapping bowel gas and soft tissue. Portal vein is patent on color Doppler imaging with normal direction of blood flow towards the liver. IVC: No abnormality visualized. Pancreas: Partially obscured by overlapping bowel gas and soft tissue. Spleen: Size and appearance within normal limits. Right Kidney: Length: 11.8 cm. Echogenicity within normal limits. No mass or hydronephrosis visualized. Left Kidney: Length: 11.0 cm. Echogenicity within normal limits. No mass or hydronephrosis visualized. Abdominal aorta: Partially obscured by overlapping bowel gas and soft tissue. Other findings: No frank ascites. IMPRESSION: Nodular heterogeneous echogenic liver consistent with known chronic liver disease. No biliary ductal dilatation. Gallbladder sludge. No splenomegaly. Portions of the abdomen are obscured by overlapping bowel gas and soft tissue. Electronically Signed   By: Ranell Bring M.D.   On: 07/21/2023 10:49   Today   Subjective    Zauria Dombek today has no new complaints - Tolerating soft diet - No vomiting - Had brown stool - No abdominal pain no chest pains no dizziness          Patient has been seen and examined prior to discharge   Objective   Blood pressure (!) 155/72, pulse (!) 58, temperature 98.4 F (36.9 C), resp. rate 19, height 5' 3 (1.6 m), weight 117.9 kg, SpO2 98%.   Intake/Output Summary (Last 24  hours) at 07/28/2023 1116 Last data filed at 07/28/2023 0932 Gross per 24 hour  Intake 720 ml  Output --  Net 720 ml    Exam Gen:- Awake Alert, no acute distress  HEENT:- [place].AT, No sclera icterus Neck-Supple Neck,No JVD,.  Lungs-  CTAB , good air movement bilaterally CV- S1, S2 normal, regular Abd-  +ve B.Sounds, Abd Soft, No tenderness,    Extremity/Skin:- No  edema,   good pulses Psych-affect is appropriate, oriented x3 Neuro-no new focal deficits, no tremors    Data Review   CBC w Diff:  Lab Results  Component Value Date   WBC 5.3 07/28/2023   HGB 11.4 (L) 07/28/2023   HGB 10.7 (L) 04/03/2023   HCT 36.3 07/28/2023   HCT 36.4 04/03/2023   PLT 163 07/28/2023   PLT 292 04/03/2023   LYMPHOPCT 21 07/25/2023   MONOPCT 9 07/25/2023   EOSPCT 2 07/25/2023   BASOPCT 1 07/25/2023   CMP:  Lab Results  Component Value Date   NA 139 07/28/2023   NA 142 04/26/2023   K 3.9 07/28/2023   CL 107 07/28/2023   CO2 26 07/28/2023   BUN 11 07/28/2023   BUN 19 04/26/2023   CREATININE 0.54 07/28/2023   PROT 6.9 04/26/2023   ALBUMIN 4.0 04/26/2023   BILITOT 0.3 04/26/2023   ALKPHOS 60 04/26/2023   AST 19 04/26/2023   ALT 13 04/26/2023  . Total Discharge time is about 33 minutes  Rendall Carwin M.D on 07/28/2023 at 11:16 AM  Go to [email or web address] -  for contact info  [facility] - Office  [phone]

## 2023-07-28 NOTE — Plan of Care (Signed)

## 2023-07-29 NOTE — Anesthesia Postprocedure Evaluation (Signed)
 Anesthesia Post Note  Patient: Ashley [name]  Procedure(s) Performed: EGD (ESOPHAGOGASTRODUODENOSCOPY)  Patient location during evaluation: Phase II Anesthesia Type: General Level of consciousness: awake Pain management: pain level controlled Vital Signs Assessment: post-procedure vital signs reviewed and stable Respiratory status: spontaneous breathing and respiratory function stable Cardiovascular status: blood pressure returned to baseline and stable Postop Assessment: no headache and no apparent nausea or vomiting Anesthetic complications: no Comments: Late entry   No notable events documented.   Last Vitals:  Vitals:   07/28/23 0432 07/28/23 1237  BP: (!) 155/72 (!) 140/62  Pulse: (!) 58   Resp: 19 16  Temp: 36.9 C 36.7 C  SpO2: 98% 97%    Last Pain:  Vitals:   07/28/23 1237  TempSrc: Oral  PainSc:                  Ashley [name]

## 2023-07-31 ENCOUNTER — Other Ambulatory Visit

## 2023-07-31 DIAGNOSIS — D500 Iron deficiency anemia secondary to blood loss (chronic): Secondary | ICD-10-CM | POA: Diagnosis not present

## 2023-07-31 DIAGNOSIS — K921 Melena: Secondary | ICD-10-CM | POA: Diagnosis not present

## 2023-07-31 DIAGNOSIS — K274 Chronic or unspecified peptic ulcer, site unspecified, with hemorrhage: Secondary | ICD-10-CM | POA: Diagnosis not present

## 2023-07-31 DIAGNOSIS — K922 Gastrointestinal hemorrhage, unspecified: Secondary | ICD-10-CM | POA: Diagnosis not present

## 2023-07-31 LAB — SURGICAL PATHOLOGY

## 2023-07-31 NOTE — Telephone Encounter (Signed)
 Will call once we get August schedule

## 2023-08-01 ENCOUNTER — Ambulatory Visit (INDEPENDENT_AMBULATORY_CARE_PROVIDER_SITE_OTHER): Payer: Self-pay | Admitting: Gastroenterology

## 2023-08-01 LAB — CBC
Hematocrit: 43.2 % (ref 34.0–46.6)
Hemoglobin: 13.3 g/dL (ref 11.1–15.9)
MCH: 28.7 pg (ref 26.6–33.0)
MCHC: 30.8 g/dL — ABNORMAL LOW (ref 31.5–35.7)
MCV: 93 fL (ref 79–97)
Platelets: 235 10*3/uL (ref 150–450)
RBC: 4.63 x10E6/uL (ref 3.77–5.28)
RDW: 17.4 % — ABNORMAL HIGH (ref 11.7–15.4)
WBC: 6.7 10*3/uL (ref 3.4–10.8)

## 2023-08-02 ENCOUNTER — Ambulatory Visit: Payer: Self-pay | Admitting: Gastroenterology

## 2023-08-04 ENCOUNTER — Encounter: Payer: Self-pay | Admitting: Nurse Practitioner

## 2023-08-04 ENCOUNTER — Ambulatory Visit: Payer: Self-pay | Admitting: Nurse Practitioner

## 2023-08-04 ENCOUNTER — Ambulatory Visit (INDEPENDENT_AMBULATORY_CARE_PROVIDER_SITE_OTHER): Admitting: Nurse Practitioner

## 2023-08-04 VITALS — BP 154/77 | HR 59 | Temp 97.0°F | Ht 63.0 in | Wt 260.0 lb

## 2023-08-04 DIAGNOSIS — G8929 Other chronic pain: Secondary | ICD-10-CM

## 2023-08-04 DIAGNOSIS — K2991 Gastroduodenitis, unspecified, with bleeding: Secondary | ICD-10-CM

## 2023-08-04 DIAGNOSIS — M545 Low back pain, unspecified: Secondary | ICD-10-CM | POA: Diagnosis not present

## 2023-08-04 LAB — HEMOGLOBIN, FINGERSTICK: Hemoglobin: 13.2 g/dL (ref 11.1–15.9)

## 2023-08-04 MED ORDER — METHYLPREDNISOLONE ACETATE 80 MG/ML IJ SUSP
80.0000 mg | Freq: Once | INTRAMUSCULAR | Status: AC
  Administered 2023-08-04: 80 mg via INTRAMUSCULAR

## 2023-08-04 MED ORDER — KETOROLAC TROMETHAMINE 60 MG/2ML IM SOLN
60.0000 mg | Freq: Once | INTRAMUSCULAR | Status: AC
  Administered 2023-08-04: 60 mg via INTRAMUSCULAR

## 2023-08-04 NOTE — Progress Notes (Signed)
 Subjective:    Patient ID: Ashley [name], female    DOB: April 22, 1950, 73 y.o.   MRN: [id number]   Chief Complaint: hospital follow up  HPI  Patient had gastroscopy on 07/27/23 and was found to have a GI bleed. Her hgb was 11.4. she is here today to make sure that hgb is stable. She was told by GI that she needs to repeat gastroscopy in August. She is currently on protonix  BID. She is doing well.   C/o low back pain- has been chronic but has been worse the last few days. Rates pain 8.5/10 currently. Pain does not radiate- stays in lower back.   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 07/27/2023   Gastritis and gastroduodenitis 07/27/2023   Impaired ambulation 05/02/2023   Decreased mobility and endurance 05/02/2023   DOE (dyspnea on exertion) 04/27/2023   Elevated liver enzymes 04/26/2023   Cirrhosis of liver without ascites (HCC) 04/26/2023   Metabolic dysfunction-associated steatotic liver disease (MASLD) 04/26/2023   Colon cancer screening 04/26/2023   Acute GI bleeding 02/03/2023   Melena 02/03/2023   Peptic ulcer disease with hemorrhage 02/03/2023   GI bleed 02/02/2023   Adnexal cyst 02/02/2023   Aortic valve stenosis, mild 03/22/2022   Encounter for other orthopedic aftercare 05/09/2017   Persistent atrial fibrillation (HCC)    Arthralgia of left ankle 04/05/2017   Severe obesity (BMI >= 40) (HCC) 07/30/2014   Hyperlipidemia 05/31/2010   HTN (hypertension) 05/31/2010   Diabetes mellitus treated with oral medication (HCC) 05/31/2010   Peripheral edema 05/31/2010       Review of Systems  Constitutional:  Negative for diaphoresis.  Eyes:  Negative for pain.  Respiratory:  Negative for shortness of breath.   Cardiovascular:  Negative for chest pain, palpitations and leg swelling.  Gastrointestinal:  Negative for abdominal pain.  Endocrine: Negative for polydipsia.  Skin:  Negative for rash.   Neurological:  Negative for dizziness, weakness and headaches.  Hematological:  Does not bruise/bleed easily.  All other systems reviewed and are negative.      Objective:   Physical Exam Vitals and nursing note reviewed.  Constitutional:      General: She is not in acute distress.    Appearance: Normal appearance. She is well-developed.  Neck:     Vascular: No carotid bruit or JVD.   Cardiovascular:     Rate and Rhythm: Normal rate and regular rhythm.     Heart sounds: Normal heart sounds.  Pulmonary:     Effort: Pulmonary effort is normal. No respiratory distress.     Breath sounds: Normal breath sounds. No wheezing or rales.  Chest:     Chest wall: No tenderness.  Abdominal:     General: Bowel sounds are normal. There is no distension or abdominal bruit.     Palpations: Abdomen is soft. There is no hepatomegaly, splenomegaly, mass or pulsatile mass.     Tenderness: There is no abdominal tenderness.   Musculoskeletal:        General: Normal range of motion.     Cervical back: Normal range of motion and neck supple.     Comments: Walking with walker (-) SLR bil  Lymphadenopathy:     Cervical: No cervical adenopathy.   Skin:    General: Skin is warm and dry.   Neurological:     Mental Status: She is alert and oriented to person, place, and time.  Deep Tendon Reflexes: Reflexes are normal and symmetric.   Psychiatric:        Behavior: Behavior normal.        Thought Content: Thought content normal.        Judgment: Judgment normal.    BP (!) 154/77   Pulse (!) 59   Temp (!) 97 F (36.1 C) (Temporal)   Ht 5' 3 (1.6 m)   Wt 260 lb (117.9 kg)   SpO2 95%   BMI 46.06 kg/m         Assessment & Plan:  [name] in today with chief complaint of Hospiral follow up   1. Gastrointestinal hemorrhage associated with gastroduodenitis (Primary) Watch diet- avoid spicy and fatty foods - Hemoglobin, fingerstick  2. Chronic midline low back pain  without sciatica Moist heat rest - methylPREDNISolone  acetate (DEPO-MEDROL ) injection 80 mg - ketorolac  (TORADOL ) injection 60 mg    The above assessment and management plan was discussed with the patient. The patient verbalized understanding of and has agreed to the management plan. Patient is aware to call the clinic if symptoms persist or worsen. Patient is aware when to return to the clinic for a follow-up visit. Patient educated on when it is appropriate to go to the emergency department.   Mary-Margaret Gladis, FNP

## 2023-08-04 NOTE — Progress Notes (Signed)
 8 wk EGD noted in recall Patient result letter mailed Patient's PCP is on EPIC

## 2023-08-04 NOTE — Patient Instructions (Signed)
 Chronic Back Pain Chronic back pain is back pain that lasts longer than 3 months. The pain may get worse at certain times (flare-ups). There are things you can do at home to manage your pain. Follow these instructions at home: Watch for any changes in your symptoms. Take these actions to help with your pain: Managing pain and stiffness     If told, put ice on the painful area. You may be told to use ice for 24-48 hours after a flare-up starts. Put ice in a plastic bag. Place a towel between your skin and the bag. Leave the ice on for 20 minutes, 2-3 times a day. If told, put heat on the painful area. Do this as often as told by your doctor. Use the heat source that your doctor recommends, such as a moist heat pack or a heating pad. Place a towel between your skin and the heat source. Leave the heat on for 20-30 minutes. If your skin turns bright red, take off the ice or heat right away to prevent skin damage. The risk of damage is higher if you cannot feel pain, heat, or cold. Soak in a warm bath. This can help with pain. Activity        Avoid bending and other activities that make the pain worse. When you stand: Keep your upper back and neck straight. Keep your shoulders pulled back. Avoid slouching. When you sit: Keep your back straight. Relax your shoulders. Do not round your shoulders or pull them backward. Do not sit or stand in one place for too long. Take short rest breaks during the day. Lying down or standing is often better than sitting. Resting can help relieve pain. When sitting or lying down for a long time, do some mild activity or stretching. This will help to prevent stiffness and pain. Get regular exercise. Ask your doctor what activities are safe for you. You may have to avoid lifting. Ask your provider how much you can safely lift. If you lift things: Bend your knees. Keep the weight close to your body. Avoid twisting. Medicines Take over-the-counter and  prescription medicines only as told by your doctor. You may need to take medicines for pain and swelling. These may be taken by mouth or put on the skin. You may also be given muscle relaxants. Ask your doctor if the medicine prescribed to you: Requires you to avoid driving or using machinery. Can cause trouble pooping (constipation). You may need to take these actions to prevent or treat trouble pooping: Drink enough fluid to keep your pee (urine) pale yellow. Take over-the-counter or prescription medicines. Eat foods that are high in fiber. These include beans, whole grains, and fresh fruits and vegetables. Limit foods that are high in fat and sugars. These include fried or sweet foods. General instructions  Sleep on a firm mattress. Try lying on your side with your knees slightly bent. If you lie on your back, put a pillow under your knees. Do not smoke or use any products that contain nicotine or tobacco. If you need help quitting, ask your doctor. Contact a doctor if: Your pain does not get better with rest or medicine. You have new pain. You have a fever. You lose weight quickly. You have trouble doing your normal activities. One or both of your legs or feet feel weak. One or both of your legs or feet lose feeling (have numbness). Get help right away if: You are not able to control when you pee  or poop. You have bad back pain and: You feel like you may vomit (nauseous). You vomit. You have pain in your chest or your belly (abdomen). You have shortness of breath. You faint. These symptoms may be an emergency. Get help right away. Call 911. Do not wait to see if the symptoms will go away. Do not drive yourself to the hospital. This information is not intended to replace advice given to you by your health care provider. Make sure you discuss any questions you have with your health care provider. Document Revised: 09/13/2021 Document Reviewed: 09/13/2021 Elsevier Patient Education   2024 [employer].

## 2023-08-09 ENCOUNTER — Encounter (INDEPENDENT_AMBULATORY_CARE_PROVIDER_SITE_OTHER): Payer: Self-pay | Admitting: Gastroenterology

## 2023-08-09 ENCOUNTER — Ambulatory Visit (INDEPENDENT_AMBULATORY_CARE_PROVIDER_SITE_OTHER): Admitting: Gastroenterology

## 2023-08-09 VITALS — BP 143/79 | HR 59 | Temp 97.8°F | Ht 62.5 in | Wt 254.0 lb

## 2023-08-09 DIAGNOSIS — Z8719 Personal history of other diseases of the digestive system: Secondary | ICD-10-CM

## 2023-08-09 DIAGNOSIS — Z8711 Personal history of peptic ulcer disease: Secondary | ICD-10-CM | POA: Diagnosis not present

## 2023-08-09 DIAGNOSIS — K7460 Unspecified cirrhosis of liver: Secondary | ICD-10-CM

## 2023-08-09 DIAGNOSIS — K274 Chronic or unspecified peptic ulcer, site unspecified, with hemorrhage: Secondary | ICD-10-CM

## 2023-08-09 DIAGNOSIS — D500 Iron deficiency anemia secondary to blood loss (chronic): Secondary | ICD-10-CM | POA: Insufficient documentation

## 2023-08-09 DIAGNOSIS — K760 Fatty (change of) liver, not elsewhere classified: Secondary | ICD-10-CM

## 2023-08-09 DIAGNOSIS — D509 Iron deficiency anemia, unspecified: Secondary | ICD-10-CM

## 2023-08-09 NOTE — Progress Notes (Signed)
 Ashley [name] , M.D. Gastroenterology & Hepatology [facility] Gastroenterology [address] [place], [place] [address] Primary Care Physician: Gladis Mustard, FNP [address] [place] [address]  Chief Complaint: cirrhosis, iron deficiency and hemorrhagic gastropathy  History of Present Illness: This is a 73 y.o. female with diabetes hypertension A-fib on Eliquis , AV stenosis , history of PUD with GI bleed (01/2023)  Forrest IIC duodenal ulcer is here for follow-up on cirrhosis, iron deficiency and hemorrhagic gastropathy  Patient recently had an outpatient surgery with upper endoscopy to evaluate healing of the ulcer but incidentally was found to have bleeding gastropathy either dieulafoy/angioectasia which required endoscopy hemostasis (clipping, bipolar and PURASTAT)   Currently patient reports she is feeling well.  Stools are brown in color and never black.  Takes iron tablets daily Previously she was taking celecoxib  every day that she have stopped and not taking Tylenol  only . The patient denies having any nausea, vomiting, fever, chills, hematochezia, melena, hematemesis, abdominal distention, abdominal pain, diarrhea, jaundice, pruritus or weight loss.  Patient is a lifelong nonalcohol user, denies any herbal medications or family history of liver issues.  Never been told of cirrhosis previously  Last EGD:07/2023  - Normal esophagus. 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 PURASTAT applied. Treated with bipolar cautery. Clip ( MR conditional) was placed. Clip manufacturer: [employer]. Biopsied. Exposed blood vessel seen upon bipolar cautery . Complete Hemostatis achieved at the end of the procedure - Normal duodenal bulb and second portion of the duodenum.  A. ABNORMAL GASTRIC MUCOSA, BIOPSY:  - Gastric antral mucosa showing marked nonspecific  reactive gastropathy  with erosion  - Negative for intestinal metaplasia, dysplasia or malignancy  - Helicobacter pylori-like organisms are not identified on routine HE  stain   01/2023  No gross lesions at the esophagus - Erythematous mucosa in the antrum. Biopsied. - Non- bleeding duodenal ulcer with a flat pigmented spot ( Forrest Class IIc) . Injected.   A. STOMACH, BIOPSY:  Gastric antral and oxyntic mucosa with slight chronic inflammation.  Negative for Helicobacter pylori.   Last Colonoscopy:none  Negative Cologaurd 3 months ago   FHx: neg for any gastrointestinal/liver disease, no malignancies Social: neg smoking, alcohol or illicit drug use Surgical: no abdominal surgeries  Past Medical History: Past Medical History:  Diagnosis Date   A-fib (HCC)    Arthritis    Depression    Diabetes mellitus without complication (HCC)    Dyspnea    Headache    Heart disease    Hyperlipidemia    Hypertension    Hypertension     Past Surgical History: Past Surgical History:  Procedure Laterality Date   BIOPSY  02/03/2023   Procedure: BIOPSY;  Surgeon: Cinderella Deatrice FALCON, MD;  [facility];  Service: Endoscopy;;   CYST EXCISION Left 03/15/2017   from buttocks   ESOPHAGOGASTRODUODENOSCOPY N/A 07/27/2023   Procedure: EGD (ESOPHAGOGASTRODUODENOSCOPY);  Surgeon: Cinderella Deatrice FALCON, MD;  Location: [facility];  Service: Endoscopy;  Laterality: N/A;  730AM, ASA 3   ESOPHAGOGASTRODUODENOSCOPY (EGD) WITH PROPOFOL  N/A 02/03/2023   Procedure: ESOPHAGOGASTRODUODENOSCOPY (EGD) WITH PROPOFOL ;  Surgeon: Cinderella Deatrice FALCON, MD;  Location: [facility];  Service: Endoscopy;  Laterality: N/A;   ORIF ANKLE FRACTURE Left 04/20/2017   Procedure: OPEN REDUCTION INTERNAL FIXATION (ORIF) LEFT ANKLE FRACTURE;  Surgeon: Kay Kemps, MD;  Location: [facility] OR;  Service: Orthopedics;  Laterality:  Left;   SCLEROTHERAPY  02/03/2023   Procedure: SCLEROTHERAPY;  Surgeon: Cinderella Deatrice FALCON, MD;   Location: [facility];  Service: Endoscopy;;    Family History: Family History  Problem Relation Age of Onset   Diabetes Mother    CAD Mother        CABG at age 59   Hypertension Mother    Hyperlipidemia Mother    Diabetes Father    Heart disease Father        CHF   Hypertension Father    Diabetes Sister    CAD Sister    Hypertension Sister    Diabetes Brother    Hypertension Brother    Healthy Sister     Social History: Social History   Tobacco Use  Smoking Status Never  Smokeless Tobacco Never   Social History   Substance and Sexual Activity  Alcohol Use No   Social History   Substance and Sexual Activity  Drug Use No    Allergies: Allergies  Allergen Reactions   Iodine Dermatitis   Cipro  [Ciprofloxacin  Hcl] Hives    Medications: Current Outpatient Medications  Medication Sig Dispense Refill   ALPRAZolam  (XANAX ) 0.25 MG tablet Take 1 tablet (0.25 mg total) by mouth at bedtime. 30 tablet 5   amLODipine  (NORVASC ) 5 MG tablet Take 1 tablet (5 mg total) by mouth daily. 90 tablet 1   apixaban  (ELIQUIS ) 5 MG TABS tablet Take 1 tablet (5 mg total) by mouth 2 (two) times daily. 180 tablet 1   ascorbic acid (VITAMIN C) 500 MG tablet Take 500 mg by mouth at bedtime.     Cholecalciferol (VITAMIN D-3 PO) Take 1 capsule by mouth at bedtime.     CINNAMON PO Take 1 capsule by mouth daily.     cloNIDine  (CATAPRES ) 0.1 MG tablet TAKE 1 TABLET TWICE DAILY 180 tablet 1   ferrous sulfate 325 (65 FE) MG tablet Take 325 mg by mouth daily with breakfast.     furosemide  (LASIX ) 20 MG tablet Take 20 mg by mouth daily.     glipiZIDE  (GLUCOTROL ) 5 MG tablet Take 5 mg by mouth 2 (two) times daily before a meal.     lisinopril  (ZESTRIL ) 40 MG tablet Take 40 mg by mouth daily.     metFORMIN  (GLUCOPHAGE -XR) 500 MG 24 hr tablet Take 500 mg by mouth daily with breakfast.     metoprolol  tartrate (LOPRESSOR ) 50 MG tablet Take 1 tablet (50 mg total) by mouth 2 (two) times daily. 180  tablet 1   Omega-3 Fatty Acids (FISH OIL PO) Take 1 capsule by mouth daily.     pantoprazole  (PROTONIX ) 40 MG tablet Take 1 tablet (40 mg total) by mouth 2 (two) times daily before a meal. 60 tablet 5   polyethylene glycol (MIRALAX  / GLYCOLAX ) 17 g packet Take 17 g by mouth daily as needed for mild constipation or moderate constipation. 30 packet 1   simvastatin  (ZOCOR ) 40 MG tablet Take 1 tablet (40 mg total) by mouth every evening. 90 tablet 1   sucralfate  (CARAFATE ) 1 g tablet Take 1 tablet (1 g total) by mouth 4 (four) times daily -  with meals and at bedtime. 120 tablet 1   tolterodine  (DETROL  LA) 2 MG 24 hr capsule Take 2 mg by mouth daily.     Turmeric (QC TUMERIC COMPLEX PO) Take 1 capsule by mouth daily.     zinc gluconate 50 MG tablet Take 50 mg by mouth at bedtime.  No current facility-administered medications for this visit.    Review of Systems: GENERAL: negative for malaise, night sweats HEENT: No changes in hearing or vision, no nose bleeds or other nasal problems. NECK: Negative for lumps, goiter, pain and significant neck swelling RESPIRATORY: Negative for cough, wheezing CARDIOVASCULAR: Negative for chest pain, leg swelling, palpitations, orthopnea GI: SEE HPI MUSCULOSKELETAL: Negative for joint pain or swelling, back pain, and muscle pain. SKIN: Negative for lesions, rash HEMATOLOGY Negative for prolonged bleeding, bruising easily, and swollen nodes. ENDOCRINE: Negative for cold or heat intolerance, polyuria, polydipsia and goiter. NEURO: negative for tremor, gait imbalance, syncope and seizures. The remainder of the review of systems is noncontributory.   Physical Exam: BP (!) 143/79   Pulse (!) 59   Temp 97.8 F (36.6 C)   Ht 5' 2.5 (1.588 m)   Wt 254 lb (115.2 kg)   BMI 45.72 kg/m  GENERAL: The patient is AO x3, in no acute distress. HEENT: Head is normocephalic and atraumatic. EOMI are intact. Mouth is well hydrated and without lesions. NECK: Supple.  No masses LUNGS: Clear to auscultation. No presence of rhonchi/wheezing/rales. Adequate chest expansion HEART: RRR, normal s1 and s2. ABDOMEN: Soft, nontender, no guarding, no peritoneal signs, and nondistended. BS +. No masses.  Imaging/Labs: as above  01/2023  IMPRESSION: 1. No acute findings. 2. Bilateral nephrolithiasis. 3. 5 cm right adnexal cyst. Recommend follow-up US  in 6-12 months. Note: This recommendation does not apply to premenarchal patients and to those with increased risk (genetic, family history, elevated tumor markers or other high-risk factors) of ovarian cancer. Reference: JACR 2020 Feb; 17(2):248-254 4. Mildly nodular hepatic contour, suggesting cirrhosis. 5.  Aortic Atherosclerosis (ICD10-I70.0).      Latest Ref Rng & Units 07/31/2023    1:56 PM 07/28/2023    4:27 AM 07/27/2023    7:47 PM  CBC  WBC 3.4 - 10.8 x10E3/uL 6.7  5.3    Hemoglobin 11.1 - 15.9 g/dL 86.6  88.5  87.4   Hematocrit 34.0 - 46.6 % 43.2  36.3  38.0   Platelets 150 - 450 x10E3/uL 235  163     Lab Results  Component Value Date   IRON 33 04/26/2023   TIBC 467 (H) 04/26/2023   FERRITIN 10 (L) 04/26/2023    I personally reviewed and interpreted the available labs, imaging and endoscopic files.  Percent saturation 7% ferritin 10 INR 1.1 Normal liver enzymes Negative AMA, ASMA Hep A nonimmune Hep B nonimmune nonexposed Hep C negative HIV negative  Ultrasound for  HCC screening in setting of cirrhosis 07/2023   IMPRESSION: Nodular heterogeneous echogenic liver consistent with known chronic liver disease. No biliary ductal dilatation. Gallbladder sludge.   No splenomegaly.   Impression and Plan: This is a 73 y.o. female with diabetes hypertension A-fib on Eliquis , AV stenosis , history of PUD with GI bleed (01/2023)  Forrest IIC duodenal ulcer is here for follow-up on cirrhosis, iron deficiency and hemorrhagic gastropathy  #History of PUD with GI Bleed  Patient underwent  upper endoscopy 02/03/2023 found to have Forrest 2C duodenal bulb ulcer status post epi injection.   Patient had an outpatient surgery with upper endoscopy to evaluate healing of the ulcer but incidentally was found to have bleeding gastropathy either dieulafoy/angioectasia which required endoscopy hemostasis (clipping, bipolar and PURASTAT)    -Avoid all NSAIDs and take Tylenol  only   PPI BID for 8 weeks than PO daily   I discussed with patient regarding indication risks limited benefit of  surveillance endoscopy to ensure healing of this lesion which patient refused at this time  #Compensated Cirrhosis  MELD 3.0: 8 at 04/26/2023 12:01 PM MELD-Na: 7 at 04/26/2023 12:01 PM Calculated from: Serum Creatinine: 0.68 mg/dL (Using min of 1 mg/dL) at 6/80/7974 87:98 PM Serum Sodium: 142 mmol/L (Using max of 137 mmol/L) at 04/26/2023 12:01 PM Total Bilirubin: 0.3 mg/dL (Using min of 1 mg/dL) at 6/80/7974 87:98 PM Serum Albumin: 4 g/dL (Using max of 3.5 g/dL) at 6/80/7974 87:98 PM INR(ratio): 1.1 at 04/26/2023 12:01 PM Age at listing (hypothetical): 48 years Sex: Female at 04/26/2023 12:01 PM   CT demonstrating nodular liver.  #Eitology : Likely MASLD with BMI of 47 and metabolic syndrome with diabetes and hypertension  # Hepatic encephalopathy None - Avoid opiates or benzodiazepines   # Ascites; none on imaging  - Low sodium diet  # Esophageal varices - Last EGD 07/2023, none   # HCC screening - Last abdominal imaging; 06 /2025, next ultrasound in 5 months  Recommendation   - Check CBC, MELD labs and AFP in 6 months - Schedule liver US  in 5 months - Reduce salt intake to <2 g per day - Can take Tylenol  max of 2 g per day (650 mg q8h) for pain - Avoid NSAIDs for pain - Avoid eating raw oysters/shellfish - Hep A and B vaccination to be pursued with PCP   #Iron deficiency Anemia  Percent saturation 7% ferritin 10 Recent blood work hemoglobin 13.3 Patient could have had iron  deficiency anemia in setting of peptic ulcer disease which has healed since and hemoglobin have uptrended  I discussed with patient regarding indication for colonoscopy for workup of iron deficiency.  Patient refused at this time and rather had performed Cologuard test which was negative.  I again reiterated to patient Cologuard as a screening test and would not suffice a diagnostic colonoscopy for workup of iron deficiency  Will repeat iron profile in future and if patient continues to be iron deficient we will reassess colonoscopy  #Right adnexal cyst  Last CT with 5 cm right adnexal cyst.  Patient to follow-up with PCP for surveillance and further management of the cyst  All questions were answered.      Ashley Parenteau Faizan Worth Kober, MD Gastroenterology and Hepatology [facility] Gastroenterology   This chart has been completed using [facility] Dictation software, and while attempts have been made to ensure accuracy , certain words and phrases may not be transcribed as intended

## 2023-08-15 NOTE — Telephone Encounter (Signed)
 Dr. Cinderella, clearance received back in March. Do we need to get clearance again?

## 2023-08-16 NOTE — Telephone Encounter (Signed)
 NOTED

## 2023-08-16 NOTE — Telephone Encounter (Signed)
 Hi Mindy , from last clinic patient wanted to defer endoscopy/colonoscopy for now . We will continue to see her in clinic and reassess again for procedure   ==============  Clinic note :   I discussed with patient regarding indication risks limited benefit of surveillance endoscopy to ensure healing of this lesion which patient refused at this time   I discussed with patient regarding indication for colonoscopy for workup of iron deficiency.  Patient refused at this time and rather had performed Cologuard test which was negative.  I again reiterated to patient Cologuard as a screening test and would not suffice a diagnostic colonoscopy for workup of iron deficiency

## 2023-08-22 ENCOUNTER — Encounter: Payer: Self-pay | Admitting: Nurse Practitioner

## 2023-08-22 ENCOUNTER — Ambulatory Visit: Admitting: Nurse Practitioner

## 2023-08-22 VITALS — BP 133/83 | HR 50 | Temp 97.1°F | Ht 62.0 in | Wt 252.0 lb

## 2023-08-22 DIAGNOSIS — G8929 Other chronic pain: Secondary | ICD-10-CM | POA: Diagnosis not present

## 2023-08-22 DIAGNOSIS — M545 Low back pain, unspecified: Secondary | ICD-10-CM

## 2023-08-22 DIAGNOSIS — D649 Anemia, unspecified: Secondary | ICD-10-CM | POA: Diagnosis not present

## 2023-08-22 LAB — CBC WITH DIFFERENTIAL/PLATELET
Basophils Absolute: 0.1 x10E3/uL (ref 0.0–0.2)
Basos: 1 %
EOS (ABSOLUTE): 0.1 x10E3/uL (ref 0.0–0.4)
Eos: 1 %
Hematocrit: 43.1 % (ref 34.0–46.6)
Hemoglobin: 13.8 g/dL (ref 11.1–15.9)
Immature Grans (Abs): 0 x10E3/uL (ref 0.0–0.1)
Immature Granulocytes: 0 %
Lymphocytes Absolute: 1.6 x10E3/uL (ref 0.7–3.1)
Lymphs: 19 %
MCH: 29.8 pg (ref 26.6–33.0)
MCHC: 32 g/dL (ref 31.5–35.7)
MCV: 93 fL (ref 79–97)
Monocytes Absolute: 0.7 x10E3/uL (ref 0.1–0.9)
Monocytes: 8 %
Neutrophils Absolute: 6.2 x10E3/uL (ref 1.4–7.0)
Neutrophils: 71 %
Platelets: 233 x10E3/uL (ref 150–450)
RBC: 4.63 x10E6/uL (ref 3.77–5.28)
RDW: 16 % — ABNORMAL HIGH (ref 11.7–15.4)
WBC: 8.7 x10E3/uL (ref 3.4–10.8)

## 2023-08-22 MED ORDER — NYSTATIN 100000 UNIT/GM EX POWD: 1.0000 | Freq: Three times a day (TID) | CUTANEOUS | 0 refills | Status: DC

## 2023-08-22 NOTE — Patient Instructions (Signed)
 Acute Back Pain, Adult Acute back pain is sudden and usually short-lived. It is often caused by an injury to the muscles and tissues in the back. The injury may result from: A muscle, tendon, or ligament getting overstretched or torn. Ligaments are tissues that connect bones to each other. Lifting something improperly can cause a back strain. Wear and tear (degeneration) of the spinal disks. Spinal disks are circular tissue that provide cushioning between the bones of the spine (vertebrae). Twisting motions, such as while playing sports or doing yard work. A hit to the back. Arthritis. You may have a physical exam, lab tests, and imaging tests to find the cause of your pain. Acute back pain usually goes away with rest and home care. Follow these instructions at home: Managing pain, stiffness, and swelling Take over-the-counter and prescription medicines only as told by your health care provider. Treatment may include medicines for pain and inflammation that are taken by mouth or applied to the skin, or muscle relaxants. Your health care provider may recommend applying ice during the first 24-48 hours after your pain starts. To do this: Put ice in a plastic bag. Place a towel between your skin and the bag. Leave the ice on for 20 minutes, 2-3 times a day. Remove the ice if your skin turns bright red. This is very important. If you cannot feel pain, heat, or cold, you have a greater risk of damage to the area. If directed, apply heat to the affected area as often as told by your health care provider. Use the heat source that your health care provider recommends, such as a moist heat pack or a heating pad. Place a towel between your skin and the heat source. Leave the heat on for 20-30 minutes. Remove the heat if your skin turns bright red. This is especially important if you are unable to feel pain, heat, or cold. You have a greater risk of getting burned. Activity  Do not stay in bed. Staying in  bed for more than 1-2 days can delay your recovery. Sit up and stand up straight. Avoid leaning forward when you sit or hunching over when you stand. If you work at a desk, sit close to it so you do not need to lean over. Keep your chin tucked in. Keep your neck drawn back, and keep your elbows bent at a 90-degree angle (right angle). Sit high and close to the steering wheel when you drive. Add lower back (lumbar) support to your car seat, if needed. Take short walks on even surfaces as soon as you are able. Try to increase the length of time you walk each day. Do not sit, drive, or stand in one place for more than 30 minutes at a time. Sitting or standing for long periods of time can put stress on your back. Do not drive or use heavy machinery while taking prescription pain medicine. Use proper lifting techniques. When you bend and lift, use positions that put less stress on your back: [place] your knees. Keep the load close to your body. Avoid twisting. Exercise regularly as told by your health care provider. Exercising helps your back heal faster and helps prevent back injuries by keeping muscles strong and flexible. Work with a physical therapist to make a safe exercise program, as recommended by your health care provider. Do any exercises as told by your physical therapist. Lifestyle Maintain a healthy weight. Extra weight puts stress on your back and makes it difficult to have good  posture. Avoid activities or situations that make you feel anxious or stressed. Stress and anxiety increase muscle tension and can make back pain worse. Learn ways to manage anxiety and stress, such as through exercise. General instructions Sleep on a firm mattress in a comfortable position. Try lying on your side with your knees slightly bent. If you lie on your back, put a pillow under your knees. Keep your head and neck in a straight line with your spine (neutral position) when using electronic equipment like  smartphones or pads. To do this: Raise your smartphone or pad to look at it instead of bending your head or neck to look down. Put the smartphone or pad at the level of your face while looking at the screen. Follow your treatment plan as told by your health care provider. This may include: Cognitive or behavioral therapy. Acupuncture or massage therapy. Meditation or yoga. Contact a health care provider if: You have pain that is not relieved with rest or medicine. You have increasing pain going down into your legs or buttocks. Your pain does not improve after 2 weeks. You have pain at night. You lose weight without trying. You have a fever or chills. You develop nausea or vomiting. You develop abdominal pain. Get help right away if: You develop new bowel or bladder control problems. You have unusual weakness or numbness in your arms or legs. You feel faint. These symptoms may represent a serious problem that is an emergency. Do not wait to see if the symptoms will go away. Get medical help right away. Call your local emergency services (911 in the [place] Do not drive yourself to the hospital. Summary Acute back pain is sudden and usually short-lived. Use proper lifting techniques. When you bend and lift, use positions that put less stress on your back. Take over-the-counter and prescription medicines only as told by your health care provider, and apply heat or ice as told. This information is not intended to replace advice given to you by your health care provider. Make sure you discuss any questions you have with your health care provider. Document Revised: 04/17/2020 Document Reviewed: 04/17/2020 Elsevier Patient Education  2024 [employer].

## 2023-08-22 NOTE — Progress Notes (Signed)
 Subjective:    Patient ID: Ashley [name], female    DOB: 1950-03-09, 73 y.o.   MRN: [id number]   Chief Complaint: chronic back pain  HPI  Patient has bene having chronic back pain for several months. 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 Needs note for work. Back pain is intermittent varying from 2/10-7/10. Increased activity increases pain. Pain will occasional radiate down either leg. Currently just takes tylenol  as needed.  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 07/27/2023   Gastritis and gastroduodenitis 07/27/2023   Impaired ambulation 05/02/2023   Decreased mobility and endurance 05/02/2023   DOE (dyspnea on exertion) 04/27/2023   Elevated liver enzymes 04/26/2023   Cirrhosis of liver without ascites (HCC) 04/26/2023   Metabolic dysfunction-associated steatotic liver disease (MASLD) 04/26/2023   Colon cancer screening 04/26/2023   Acute GI bleeding 02/03/2023   Melena 02/03/2023   Peptic ulcer disease with hemorrhage 02/03/2023   GI bleed 02/02/2023   Adnexal cyst 02/02/2023   Aortic valve stenosis, mild 03/22/2022   Encounter for other orthopedic aftercare 05/09/2017   Persistent atrial fibrillation (HCC)    Arthralgia of left ankle 04/05/2017   Severe obesity (BMI >= 40) (HCC) 07/30/2014   Hyperlipidemia 05/31/2010   HTN (hypertension) 05/31/2010   Diabetes mellitus treated with oral medication (HCC) 05/31/2010   Peripheral edema 05/31/2010       Review of Systems  Constitutional:  Negative for diaphoresis.  Eyes:  Negative for pain.  Respiratory:  Negative for shortness of breath.   Cardiovascular:  Negative for chest pain, palpitations and leg swelling.  Gastrointestinal:  Negative for abdominal pain.  Endocrine: Negative for polydipsia.   Musculoskeletal:  Positive for back pain.  Skin:  Negative for rash.  Neurological:  Negative for dizziness, weakness and headaches.  Hematological:  Does not bruise/bleed easily.  All other systems reviewed and are negative.      Objective:   Physical Exam Constitutional:      Appearance: Normal appearance. She is obese.  Cardiovascular:     Rate and Rhythm: Normal rate and regular rhythm.     Heart sounds: Normal heart sounds.  Pulmonary:     Breath sounds: Normal breath sounds.  Musculoskeletal:     Comments: FROM of lumbar spine with pain on flexion and extension   Skin:    General: Skin is warm.  Neurological:     General: No focal deficit present.     Mental Status: She is alert and oriented to person, place, and time.  Psychiatric:        Mood and Affect: Mood normal.        Behavior: Behavior normal.     BP 133/83   Pulse (!) 50   Temp (!) 97.1 F (36.2 C) (Temporal)   Ht 5' 2 (1.575 m)   Wt 252 lb (114.3 kg)   SpO2 95%   BMI 46.09 kg/m        Assessment & Plan:   Ashley [name] in today with chief complaint of No chief complaint on file.   1. Low hemoglobin (Primary) Lab sending - CBC with Differential/Platelet  2. Chronic midline low back pain without sciatica Moist heat Back stretches Patient will get us  FMLA papers - Ambulatory referral to  Physical Therapy    The above assessment and management plan was discussed with the patient. The patient verbalized understanding of and has agreed to the management plan. Patient is aware to call the clinic if symptoms persist or worsen. Patient is aware when to return to the clinic for a follow-up visit. Patient educated on when it is appropriate to go to the emergency department.   Ashley Gladis, FNP

## 2023-08-24 ENCOUNTER — Ambulatory Visit: Payer: Self-pay | Admitting: Nurse Practitioner

## 2023-08-24 ENCOUNTER — Other Ambulatory Visit (HOSPITAL_COMMUNITY): Payer: Self-pay

## 2023-08-25 ENCOUNTER — Other Ambulatory Visit (HOSPITAL_COMMUNITY): Payer: Self-pay

## 2023-08-25 ENCOUNTER — Telehealth: Payer: Self-pay

## 2023-08-25 NOTE — Telephone Encounter (Signed)
 Pharmacy Patient Advocate Encounter   Received notification from CoverMyMeds that prior authorization for Klayesta  100000 UNIT/GM powder is required/requested.   Insurance verification completed.   The patient is insured through [employer] .   Per test claim: PA required; PA started via CoverMyMeds. KEY [id number] . Waiting for clinical questions to populate.

## 2023-08-27 ENCOUNTER — Other Ambulatory Visit: Payer: Self-pay | Admitting: Nurse Practitioner

## 2023-08-27 DIAGNOSIS — I152 Hypertension secondary to endocrine disorders: Secondary | ICD-10-CM

## 2023-08-27 DIAGNOSIS — E782 Mixed hyperlipidemia: Secondary | ICD-10-CM

## 2023-08-27 DIAGNOSIS — I4819 Other persistent atrial fibrillation: Secondary | ICD-10-CM

## 2023-08-28 NOTE — Telephone Encounter (Signed)
 Pt aware of approval of Klayesta  and will contact the pharmacy.

## 2023-08-28 NOTE — Telephone Encounter (Signed)
 Pharmacy Patient Advocate Encounter  Received notification from [employer] that Prior Authorization for Klayesta  100000 UNIT/GM powder  has been APPROVED from 08/28/23 to 02/07/24   PA #/Case ID/Reference #: [id number]

## 2023-08-28 NOTE — Telephone Encounter (Signed)
 PLEASE BE ADVISED Clinical questions have been answered and PA submitted.TO PLAN. PA currently Pending.

## 2023-08-30 ENCOUNTER — Ambulatory Visit: Attending: Nurse Practitioner | Admitting: Physical Therapy

## 2023-08-30 ENCOUNTER — Encounter: Payer: Self-pay | Admitting: Physical Therapy

## 2023-08-30 ENCOUNTER — Telehealth: Payer: Self-pay | Admitting: Nurse Practitioner

## 2023-08-30 ENCOUNTER — Other Ambulatory Visit: Payer: Self-pay

## 2023-08-30 DIAGNOSIS — Z0279 Encounter for issue of other medical certificate: Secondary | ICD-10-CM

## 2023-08-30 DIAGNOSIS — M545 Low back pain, unspecified: Secondary | ICD-10-CM | POA: Diagnosis not present

## 2023-08-30 DIAGNOSIS — M62830 Muscle spasm of back: Secondary | ICD-10-CM | POA: Insufficient documentation

## 2023-08-30 DIAGNOSIS — G8929 Other chronic pain: Secondary | ICD-10-CM | POA: Insufficient documentation

## 2023-08-30 DIAGNOSIS — M5459 Other low back pain: Secondary | ICD-10-CM | POA: Diagnosis not present

## 2023-08-30 NOTE — Therapy (Signed)
 OUTPATIENT PHYSICAL THERAPY THORACOLUMBAR EVALUATION   Patient Name: Ashley [name] MRN: [id number] DOB:30-Oct-1950, 73 y.o., female Today's Date: 08/30/2023  END OF SESSION:  PT End of Session - 08/30/23 1317     Visit Number 1    Number of Visits 12    Date for PT Re-Evaluation 10/11/23    PT Start Time 1255    PT Stop Time 1344    PT Time Calculation (min) 49 min    Activity Tolerance Patient tolerated treatment well    Behavior During Therapy [facility] for tasks assessed/performed          Past Medical History:  Diagnosis Date   A-fib (HCC)    Arthritis    Depression    Diabetes mellitus without complication (HCC)    Dyspnea    Headache    Heart disease    Hyperlipidemia    Hypertension    Hypertension    Past Surgical History:  Procedure Laterality Date   BIOPSY  02/03/2023   Procedure: BIOPSY;  Surgeon: Cinderella Deatrice FALCON, MD;  Location[facility];  Service: Endoscopy;;   CYST EXCISION Left 03/15/2017   from buttocks   ESOPHAGOGASTRODUODENOSCOPY N/A 07/27/2023   Procedure: EGD (ESOPHAGOGASTRODUODENOSCOPY);  Surgeon: Cinderella Deatrice FALCON, MD;  Location: [facility];  Service: Endoscopy;  Laterality: N/A;  730AM, ASA 3   ESOPHAGOGASTRODUODENOSCOPY (EGD) WITH PROPOFOL  N/A 02/03/2023   Procedure: ESOPHAGOGASTRODUODENOSCOPY (EGD) WITH PROPOFOL ;  Surgeon: Cinderella Deatrice FALCON, MD;  Location: [facility];  Service: Endoscopy;  Laterality: N/A;   ORIF ANKLE FRACTURE Left 04/20/2017   Procedure: OPEN REDUCTION INTERNAL FIXATION (ORIF) LEFT ANKLE FRACTURE;  Surgeon: Kay Kemps, MD;  Location: [facility] OR;  Service: Orthopedics;  Laterality: Left;   SCLEROTHERAPY  02/03/2023   Procedure: SCLEROTHERAPY;  Surgeon: Cinderella Deatrice FALCON, MD;  Location: [facility];  Service: Endoscopy;;   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 07/27/2023   Gastritis  and gastroduodenitis 07/27/2023   Impaired ambulation 05/02/2023   Decreased mobility and endurance 05/02/2023   DOE (dyspnea on exertion) 04/27/2023   Elevated liver enzymes 04/26/2023   Cirrhosis of liver without ascites (HCC) 04/26/2023   Metabolic dysfunction-associated steatotic liver disease (MASLD) 04/26/2023   Colon cancer screening 04/26/2023   Acute GI bleeding 02/03/2023   Melena 02/03/2023   Peptic ulcer disease with hemorrhage 02/03/2023   GI bleed 02/02/2023   Adnexal cyst 02/02/2023   Aortic valve stenosis, mild 03/22/2022   Encounter for other orthopedic aftercare 05/09/2017   Persistent atrial fibrillation (HCC)    Arthralgia of left ankle 04/05/2017   Severe obesity (BMI >= 40) (HCC) 07/30/2014   Hyperlipidemia 05/31/2010   HTN (hypertension) 05/31/2010   Diabetes mellitus treated with oral medication (HCC) 05/31/2010   Peripheral edema 05/31/2010    REFERRING PROVIDER: Mary-Margaret Gladis  REFERRING DIAG: Chronic midline low back pain without Sciatica.    Rationale for Evaluation and Treatment: Rehabilitation  THERAPY DIAG:  Other low back pain - Plan: PT plan of care cert/re-cert  Muscle spasm of back - Plan: PT plan of care cert/re-cert  ONSET DATE: Ongoing.    SUBJECTIVE:  SUBJECTIVE STATEMENT: The patient presents to the clinic with c/o pain across her low back.  She states after being taken off Celebrex  her back began to hurt.  She had PT last year which was quite helpful.  Her pain is rated at a 7-8/10.  Working, standing and walking increase her pain.  Sitting decreases her pain.  She now uses a walker as she feels safer.    PERTINENT HISTORY:  H/o LBP.  PAIN:  Are you having pain? Yes: NPRS scale: 7-8/10.   Pain location: Lower lumbar region.  Pain  description: Ache Aggravating factors: As above. Relieving factors: As above.    PRECAUTIONS: Fall.  Recommend patient use walker at all times.  RED FLAGS: None   WEIGHT BEARING RESTRICTIONS: No  FALLS:  Has patient fallen in last 6 months? Yes. Number of falls 2.  Now using walker.    LIVING ENVIRONMENT: Lives with: lives alone (Family nearby).   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    PLOF: Independent with basic ADLs and Independent with household mobility with device  PATIENT GOALS:Drive bus again and do yardwork.    OBJECTIVE:  Note: Objective measures were completed at Evaluation unless otherwise noted.  DIAGNOSTIC FINDINGS:   07/18/23:  Most notable is the increased lordosis in the lumbar spine with the facet arthritis starting second lumbar vertebrae and transitioning down through S1 with L4-5 grade 1 spondylolisthesis.  The disc spaces are preserved   Impression spondylosis lumbar spine with spondylolisthesis  PATIENT SURVEYS:  ODI:  25/50.   POSTURE: rounded shoulders, forward head, decreased lumbar lordosis, and flexed trunk   PALPATION: Tender to palpation at L4 to S1 and across bilaterally from this region.    LUMBAR ROM:   Full active lumbar flexion and extension limited to 0 degrees.  LOWER EXTREMITY ROM:     WFL.  LOWER EXTREMITY MMT:    Bilateral hip flexion decreased to 4/5 which may be related to pain.    LUMBAR SPECIAL TESTS:  Negative bilateral Slump test.    GAIT: The patient walks in a flexed trunk posture using a FWW with a decrease in step and stride length.    TREATMENT DATE: IFC at 80-150 Hz on 40% scan to patient's bilateral lower lumbar musculature x 18 minutes f/b STW/M  x 8 minutes.   Normal modality response following removal of modality.  Patient felt better following treatment.                                                                                                                                 PATIENT EDUCATION:  Education details:  Person educated:  International aid/development worker:  Education comprehension:   HOME EXERCISE PROGRAM:   ASSESSMENT:  CLINICAL IMPRESSION: The patient presents to OPPT with c/o bilateral low back pain from L4 to S1.  Her pain began increasing after being off Celebrex .  She exhibits a negative Slump test bilaterally.  She has decreased hip flexion strength which appears to be related to pain.  She walks safely with a FWW in a flexed trunk posture with a decrease in step and stride length.  Her ODI score is 25/50. Patient will benefit from skilled physical therapy intervention to address pain and deficits.    OBJECTIVE IMPAIRMENTS: Abnormal gait, decreased activity tolerance, increased muscle spasms, postural dysfunction, and pain.   ACTIVITY LIMITATIONS: carrying, lifting, bending, standing, and locomotion level  PARTICIPATION LIMITATIONS: meal prep, cleaning, laundry, occupation, and yard work  PERSONAL FACTORS: Time since onset of injury/illness/exacerbation are also affecting patient's functional outcome.   REHAB POTENTIAL: Good  CLINICAL DECISION MAKING: Stable/uncomplicated  EVALUATION COMPLEXITY: Low   GOALS:  SHORT TERM GOALS: Target date: 09/13/23  Ind with an HEP. Baseline: Goal status: INITIAL    LONG TERM GOALS: Target date: 10/11/23  Perform ADL's with pain not > 3/10.  Goal status: INITIAL  2.  Walk a community distance with her FWW with pain not > 3/10.  Goal status: INITIAL  3.  Perform yardwork with pain not > 3/10.  Goal status: INITIAL   PLAN:  PT FREQUENCY: 2x/week  PT DURATION: 6 weeks  PLANNED INTERVENTIONS: 97110-Therapeutic exercises, 97530- Therapeutic activity, [id number]- Neuromuscular re-education, 97535- Self Care, [id number]- Manual therapy, [id number]- Electrical stimulation (unattended), 97035- Ultrasound, 79439 (1-2 muscles), 20561 (3+  muscles)- Dry Needling, Patient/Family education, and Moist heat.  PLAN FOR NEXT SESSION: Nustep, draw-in progression.  Modalities and STW as needed.    Addison Whidbee, [place], PT 08/30/2023, 1:52 PM

## 2023-08-30 NOTE — Telephone Encounter (Signed)
 PT dropped off FMLA forms to be completed and signed.  Form Fee Paid? (YES)            If NO, form is placed on front office manager desk to hold until payment received. If YES, then form will be placed in the RX/HH Nurse Coordinators box for completion.  Form will not be processed until payment is received

## 2023-09-04 ENCOUNTER — Ambulatory Visit

## 2023-09-04 DIAGNOSIS — M62830 Muscle spasm of back: Secondary | ICD-10-CM

## 2023-09-04 DIAGNOSIS — G8929 Other chronic pain: Secondary | ICD-10-CM | POA: Diagnosis not present

## 2023-09-04 DIAGNOSIS — M5459 Other low back pain: Secondary | ICD-10-CM | POA: Diagnosis not present

## 2023-09-04 DIAGNOSIS — M545 Low back pain, unspecified: Secondary | ICD-10-CM | POA: Diagnosis not present

## 2023-09-04 NOTE — Telephone Encounter (Signed)
 PCP completed and signed FMLA forms. They have been faxed to [employer] at fax number [phone]. Patient has been contacted and informed they are complete. Copy at front desk

## 2023-09-04 NOTE — Therapy (Signed)
 OUTPATIENT PHYSICAL THERAPY THORACOLUMBAR TREATMENT   Patient Name: Ashley [name] MRN: [id number] DOB:08/19/1950, 73 y.o., female Today's Date: 09/04/2023  END OF SESSION:  PT End of Session - 09/04/23 1303     Visit Number 2    Number of Visits 12    Date for PT Re-Evaluation 10/11/23    PT Start Time 1300    PT Stop Time 1358    PT Time Calculation (min) 58 min    Activity Tolerance Patient tolerated treatment well    Behavior During Therapy [facility] for tasks assessed/performed          Past Medical History:  Diagnosis Date   A-fib (HCC)    Arthritis    Depression    Diabetes mellitus without complication (HCC)    Dyspnea    Headache    Heart disease    Hyperlipidemia    Hypertension    Hypertension    Past Surgical History:  Procedure Laterality Date   BIOPSY  02/03/2023   Procedure: BIOPSY;  Surgeon: Cinderella Deatrice FALCON, MD;  [facility];  Service: Endoscopy;;   CYST EXCISION Left 03/15/2017   from buttocks   ESOPHAGOGASTRODUODENOSCOPY N/A 07/27/2023   Procedure: EGD (ESOPHAGOGASTRODUODENOSCOPY);  Surgeon: Cinderella Deatrice FALCON, MD;  [facility];  Service: Endoscopy;  Laterality: N/A;  730AM, ASA 3   ESOPHAGOGASTRODUODENOSCOPY (EGD) WITH PROPOFOL  N/A 02/03/2023   Procedure: ESOPHAGOGASTRODUODENOSCOPY (EGD) WITH PROPOFOL ;  Surgeon: Cinderella Deatrice FALCON, MD;  [facility];  Service: Endoscopy;  Laterality: N/A;   ORIF ANKLE FRACTURE Left 04/20/2017   Procedure: OPEN REDUCTION INTERNAL FIXATION (ORIF) LEFT ANKLE FRACTURE;  Surgeon: Kay Kemps, MD;  Location: [facility] OR;  Service: Orthopedics;  Laterality: Left;   SCLEROTHERAPY  02/03/2023   Procedure: SCLEROTHERAPY;  Surgeon: Cinderella Deatrice FALCON, MD;  [facility];  Service: Endoscopy;;   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 07/27/2023   Gastritis  and gastroduodenitis 07/27/2023   Impaired ambulation 05/02/2023   Decreased mobility and endurance 05/02/2023   DOE (dyspnea on exertion) 04/27/2023   Elevated liver enzymes 04/26/2023   Cirrhosis of liver without ascites (HCC) 04/26/2023   Metabolic dysfunction-associated steatotic liver disease (MASLD) 04/26/2023   Colon cancer screening 04/26/2023   Acute GI bleeding 02/03/2023   Melena 02/03/2023   Peptic ulcer disease with hemorrhage 02/03/2023   GI bleed 02/02/2023   Adnexal cyst 02/02/2023   Aortic valve stenosis, mild 03/22/2022   Encounter for other orthopedic aftercare 05/09/2017   Persistent atrial fibrillation (HCC)    Arthralgia of left ankle 04/05/2017   Severe obesity (BMI >= 40) (HCC) 07/30/2014   Hyperlipidemia 05/31/2010   HTN (hypertension) 05/31/2010   Diabetes mellitus treated with oral medication (HCC) 05/31/2010   Peripheral edema 05/31/2010    REFERRING PROVIDER: Mary-Margaret Gladis  REFERRING DIAG: Chronic midline low back pain without Sciatica.    Rationale for Evaluation and Treatment: Rehabilitation  THERAPY DIAG:  Other low back pain  Muscle spasm of back  ONSET DATE: Ongoing.    SUBJECTIVE:  SUBJECTIVE STATEMENT: Pt reports 7/10 low back pain today.  Pt states that back pain affects her ability to cook and perform chores at home.   PERTINENT HISTORY:  H/o LBP.  PAIN:  Are you having pain? Yes: NPRS scale: 7/10.   Pain location: Lower lumbar region.  Pain description: Ache Aggravating factors: As above. Relieving factors: As above.    PRECAUTIONS: Fall.  Recommend patient use walker at all times.  RED FLAGS: None   WEIGHT BEARING RESTRICTIONS: No  FALLS:  Has patient fallen in last 6 months? Yes. Number of falls 2.  Now using walker.    LIVING  ENVIRONMENT: Lives with: lives alone (Family nearby).   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    PLOF: Independent with basic ADLs and Independent with household mobility with device  PATIENT GOALS:Drive bus again and do yardwork.    OBJECTIVE:  Note: Objective measures were completed at Evaluation unless otherwise noted.  DIAGNOSTIC FINDINGS:   07/18/23:  Most notable is the increased lordosis in the lumbar spine with the facet arthritis starting second lumbar vertebrae and transitioning down through S1 with L4-5 grade 1 spondylolisthesis.  The disc spaces are preserved   Impression spondylosis lumbar spine with spondylolisthesis  PATIENT SURVEYS:  ODI:  25/50.   POSTURE: rounded shoulders, forward head, decreased lumbar lordosis, and flexed trunk   PALPATION: Tender to palpation at L4 to S1 and across bilaterally from this region.    LUMBAR ROM:   Full active lumbar flexion and extension limited to 0 degrees.  LOWER EXTREMITY ROM:     WFL.  LOWER EXTREMITY MMT:    Bilateral hip flexion decreased to 4/5 which may be related to pain.    LUMBAR SPECIAL TESTS:  Negative bilateral Slump test.    GAIT: The patient walks in a flexed trunk posture using a FWW with a decrease in step and stride length.    TREATMENT DATE:   09/04/23                                 EXERCISE LOG  Exercise Repetitions and Resistance Comments  Nustep Lvl 2 x 16 mins        Blank cell = exercise not performed today   Manual Therapy Soft Tissue Mobilization: Lumbar Spine, STW/M to bil lumbar paraspinals with concentration on R > L, pt leaning over elevated plinth for comofrt   Modalities  Date:  Unattended Estim: Lumbar, IFC 80-150 Hz, 20 mins, Pain and Tone   08/30/23 IFC at 80-150 Hz on 40% scan to patient's bilateral lower lumbar musculature x 18 minutes f/b STW/M  x 8 minutes.    Normal modality response following removal of modality.  Patient felt better following treatment.  PATIENT EDUCATION:  Education details:  Person educated:  International aid/development worker:  Education comprehension:   HOME EXERCISE PROGRAM:   ASSESSMENT:  CLINICAL IMPRESSION: Pt arrives for today's treatment session reporting 7/10 low back pain.  Pt felt relief after last treatment session. Pt able to tolerate warm up on Nustep today without any discomfort.  STW/M performed to lumbar musculature to decrease pain and tone with concentration on right side.  Normal responses to estim noted upon removal.  Pt reported 2/10 low back pain at completion of today's treatment session.    OBJECTIVE IMPAIRMENTS: Abnormal gait, decreased activity tolerance, increased muscle spasms, postural dysfunction, and pain.   ACTIVITY LIMITATIONS: carrying, lifting, bending, standing, and locomotion level  PARTICIPATION LIMITATIONS: meal prep, cleaning, laundry, occupation, and yard work  PERSONAL FACTORS: Time since onset of injury/illness/exacerbation are also affecting patient's functional outcome.   REHAB POTENTIAL: Good  CLINICAL DECISION MAKING: Stable/uncomplicated  EVALUATION COMPLEXITY: Low   GOALS:  SHORT TERM GOALS: Target date: 09/13/23  Ind with an HEP. Baseline: Goal status: INITIAL    LONG TERM GOALS: Target date: 10/11/23  Perform ADL's with pain not > 3/10.  Goal status: INITIAL  2.  Walk a community distance with her FWW with pain not > 3/10.  Goal status: INITIAL  3.  Perform yardwork with pain not > 3/10.  Goal status: INITIAL   PLAN:  PT FREQUENCY: 2x/week  PT DURATION: 6 weeks  PLANNED INTERVENTIONS: 97110-Therapeutic exercises, 97530- Therapeutic activity, [id number]- Neuromuscular re-education, 97535- Self Care, [id number]- Manual therapy, G0283- Electrical  stimulation (unattended), 97035- Ultrasound, 79439 (1-2 muscles), 20561 (3+ muscles)- Dry Needling, Patient/Family education, and Moist heat.  PLAN FOR NEXT SESSION: Nustep, draw-in progression.  Modalities and STW as needed.    Delon DELENA Gosling, PTA 09/04/2023, 1:58 PM

## 2023-09-06 ENCOUNTER — Encounter: Payer: Self-pay | Admitting: Physical Therapy

## 2023-09-06 ENCOUNTER — Ambulatory Visit: Admitting: Physical Therapy

## 2023-09-06 DIAGNOSIS — M5459 Other low back pain: Secondary | ICD-10-CM | POA: Diagnosis not present

## 2023-09-06 DIAGNOSIS — M62830 Muscle spasm of back: Secondary | ICD-10-CM

## 2023-09-06 DIAGNOSIS — M545 Low back pain, unspecified: Secondary | ICD-10-CM | POA: Diagnosis not present

## 2023-09-06 DIAGNOSIS — G8929 Other chronic pain: Secondary | ICD-10-CM | POA: Diagnosis not present

## 2023-09-06 NOTE — Therapy (Signed)
 OUTPATIENT PHYSICAL THERAPY THORACOLUMBAR TREATMENT   Patient Name: Ashley [name] MRN: [id number] DOB:01/29/1951, 73 y.o., female Today's Date: 09/06/2023  END OF SESSION:  PT End of Session - 09/06/23 1037     Visit Number 3    Number of Visits 12    Date for PT Re-Evaluation 10/11/23    PT Start Time 0930    PT Stop Time 1021    PT Time Calculation (min) 51 min    Activity Tolerance Patient tolerated treatment well    Behavior During Therapy [facility] for tasks assessed/performed          Past Medical History:  Diagnosis Date   A-fib (HCC)    Arthritis    Depression    Diabetes mellitus without complication (HCC)    Dyspnea    Headache    Heart disease    Hyperlipidemia    Hypertension    Hypertension    Past Surgical History:  Procedure Laterality Date   BIOPSY  02/03/2023   Procedure: BIOPSY;  Surgeon: Cinderella Deatrice FALCON, MD;  [facility];  Service: Endoscopy;;   CYST EXCISION Left 03/15/2017   from buttocks   ESOPHAGOGASTRODUODENOSCOPY N/A 07/27/2023   Procedure: EGD (ESOPHAGOGASTRODUODENOSCOPY);  Surgeon: Cinderella Deatrice FALCON, MD;  [facility];  Service: Endoscopy;  Laterality: N/A;  730AM, ASA 3   ESOPHAGOGASTRODUODENOSCOPY (EGD) WITH PROPOFOL  N/A 02/03/2023   Procedure: ESOPHAGOGASTRODUODENOSCOPY (EGD) WITH PROPOFOL ;  Surgeon: Cinderella Deatrice FALCON, MD;  [facility];  Service: Endoscopy;  Laterality: N/A;   ORIF ANKLE FRACTURE Left 04/20/2017   Procedure: OPEN REDUCTION INTERNAL FIXATION (ORIF) LEFT ANKLE FRACTURE;  Surgeon: Kay Kemps, MD;  Location: [facility] OR;  Service: Orthopedics;  Laterality: Left;   SCLEROTHERAPY  02/03/2023   Procedure: SCLEROTHERAPY;  Surgeon: Cinderella Deatrice FALCON, MD;  [facility];  Service: Endoscopy;;   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 07/27/2023   Gastritis  and gastroduodenitis 07/27/2023   Impaired ambulation 05/02/2023   Decreased mobility and endurance 05/02/2023   DOE (dyspnea on exertion) 04/27/2023   Elevated liver enzymes 04/26/2023   Cirrhosis of liver without ascites (HCC) 04/26/2023   Metabolic dysfunction-associated steatotic liver disease (MASLD) 04/26/2023   Colon cancer screening 04/26/2023   Acute GI bleeding 02/03/2023   Melena 02/03/2023   Peptic ulcer disease with hemorrhage 02/03/2023   GI bleed 02/02/2023   Adnexal cyst 02/02/2023   Aortic valve stenosis, mild 03/22/2022   Encounter for other orthopedic aftercare 05/09/2017   Persistent atrial fibrillation (HCC)    Arthralgia of left ankle 04/05/2017   Severe obesity (BMI >= 40) (HCC) 07/30/2014   Hyperlipidemia 05/31/2010   HTN (hypertension) 05/31/2010   Diabetes mellitus treated with oral medication (HCC) 05/31/2010   Peripheral edema 05/31/2010    REFERRING PROVIDER: Mary-Margaret Gladis  REFERRING DIAG: Chronic midline low back pain without Sciatica.    Rationale for Evaluation and Treatment: Rehabilitation  THERAPY DIAG:  Other low back pain  Muscle spasm of back  ONSET DATE: Ongoing.    SUBJECTIVE:  SUBJECTIVE STATEMENT: Pain at an 8 and a half.  Had to take a Tylenol .   PERTINENT HISTORY:  H/o LBP.  PAIN:  Are you having pain? Yes: NPRS scale: 8.5/10.   Pain location: Lower lumbar region.  Pain description: Ache Aggravating factors: As above. Relieving factors: As above.    PRECAUTIONS: Fall.  Recommend patient use walker at all times.  RED FLAGS: None   WEIGHT BEARING RESTRICTIONS: No  FALLS:  Has patient fallen in last 6 months? Yes. Number of falls 2.  Now using walker.    LIVING ENVIRONMENT: Lives with: lives alone (Family nearby).   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    PLOF: Independent with basic ADLs and Independent with household mobility with device  PATIENT GOALS:Drive bus again and do yardwork.    OBJECTIVE:  Note: Objective measures were completed at Evaluation unless otherwise noted.  DIAGNOSTIC FINDINGS:   07/18/23:  Most notable is the increased lordosis in the lumbar spine with the facet arthritis starting second lumbar vertebrae and transitioning down through S1 with L4-5 grade 1 spondylolisthesis.  The disc spaces are preserved   Impression spondylosis lumbar spine with spondylolisthesis  PATIENT SURVEYS:  ODI:  25/50.   POSTURE: rounded shoulders, forward head, decreased lumbar lordosis, and flexed trunk   PALPATION: Tender to palpation at L4 to S1 and across bilaterally from this region.    LUMBAR ROM:   Full active lumbar flexion and extension limited to 0 degrees.  LOWER EXTREMITY ROM:     WFL.  LOWER EXTREMITY MMT:    Bilateral hip flexion decreased to 4/5 which may be related to pain.    LUMBAR SPECIAL TESTS:  Negative bilateral Slump test.    GAIT: The patient walks in a flexed trunk posture using a FWW with a decrease in step and stride length.    TREATMENT DATE:   09/06/23:  Nustep level 3 f/b STW/M x 8 minutes f/b IFC at 80-150 Hz on 40% scan x 20 minutes with focus on right lumbar region.  Normal modality response following removal of modality.  09/04/23                                 EXERCISE LOG  Exercise Repetitions and Resistance Comments  Nustep Lvl 2 x 16 mins        Blank cell = exercise not performed today   Manual Therapy Soft Tissue Mobilization: Lumbar Spine, STW/M to bil lumbar paraspinals with concentration on R > L, pt leaning over elevated plinth for comofrt   Modalities  Date:  Unattended Estim: Lumbar, IFC 80-150 Hz, 20 mins, Pain and Tone    08/30/23 IFC at 80-150 Hz on 40% scan to patient's bilateral lower lumbar musculature x 18 minutes f/b STW/M  x 8 minutes.   Normal modality response following removal of modality.  Patient felt better following treatment.  PATIENT EDUCATION:  Education details:  Person educated:  International aid/development worker:  Education comprehension:   HOME EXERCISE PROGRAM:   ASSESSMENT:  CLINICAL IMPRESSION: Patient with high pain-level upon presentation to the clinic today.  Her CC was in the region of her right SIJ.  She felt better after treatment.    OBJECTIVE IMPAIRMENTS: Abnormal gait, decreased activity tolerance, increased muscle spasms, postural dysfunction, and pain.   ACTIVITY LIMITATIONS: carrying, lifting, bending, standing, and locomotion level  PARTICIPATION LIMITATIONS: meal prep, cleaning, laundry, occupation, and yard work  PERSONAL FACTORS: Time since onset of injury/illness/exacerbation are also affecting patient's functional outcome.   REHAB POTENTIAL: Good  CLINICAL DECISION MAKING: Stable/uncomplicated  EVALUATION COMPLEXITY: Low   GOALS:  SHORT TERM GOALS: Target date: 09/13/23  Ind with an HEP. Baseline: Goal status: INITIAL    LONG TERM GOALS: Target date: 10/11/23  Perform ADL's with pain not > 3/10.  Goal status: INITIAL  2.  Walk a community distance with her FWW with pain not > 3/10.  Goal status: INITIAL  3.  Perform yardwork with pain not > 3/10.  Goal status: INITIAL   PLAN:  PT FREQUENCY: 2x/week  PT DURATION: 6 weeks  PLANNED INTERVENTIONS: 97110-Therapeutic exercises, 97530- Therapeutic activity, [id number]- Neuromuscular re-education, 97535- Self Care, 02859- Manual therapy, G0283- Electrical stimulation (unattended), 97035- Ultrasound, 79439 (1-2 muscles), 20561 (3+ muscles)- Dry Needling, Patient/Family education, and Moist  heat.  PLAN FOR NEXT SESSION: Nustep, draw-in progression.  Modalities and STW as needed.    Nahlia Hellmann, [place], PT 09/06/2023, 10:39 AM

## 2023-09-11 ENCOUNTER — Ambulatory Visit: Attending: Nurse Practitioner

## 2023-09-11 DIAGNOSIS — M5459 Other low back pain: Secondary | ICD-10-CM | POA: Insufficient documentation

## 2023-09-11 DIAGNOSIS — M62830 Muscle spasm of back: Secondary | ICD-10-CM | POA: Diagnosis not present

## 2023-09-11 NOTE — Therapy (Signed)
 OUTPATIENT PHYSICAL THERAPY THORACOLUMBAR TREATMENT   Patient Name: Ashley [name] MRN: [id number] DOB:Jul 19, 1950, 73 y.o., female Today's Date: 09/11/2023  END OF SESSION:  PT End of Session - 09/11/23 0933     Visit Number 4    Number of Visits 12    Date for PT Re-Evaluation 10/11/23    PT Start Time 0930    PT Stop Time 1022    PT Time Calculation (min) 52 min    Activity Tolerance Patient tolerated treatment well    Behavior During Therapy [facility] for tasks assessed/performed          Past Medical History:  Diagnosis Date   A-fib (HCC)    Arthritis    Depression    Diabetes mellitus without complication (HCC)    Dyspnea    Headache    Heart disease    Hyperlipidemia    Hypertension    Hypertension    Past Surgical History:  Procedure Laterality Date   BIOPSY  02/03/2023   Procedure: BIOPSY;  Surgeon: Cinderella Deatrice FALCON, MD;  [facility];  Service: Endoscopy;;   CYST EXCISION Left 03/15/2017   from buttocks   ESOPHAGOGASTRODUODENOSCOPY N/A 07/27/2023   Procedure: EGD (ESOPHAGOGASTRODUODENOSCOPY);  Surgeon: Cinderella Deatrice FALCON, MD;  [facility];  Service: Endoscopy;  Laterality: N/A;  730AM, ASA 3   ESOPHAGOGASTRODUODENOSCOPY (EGD) WITH PROPOFOL  N/A 02/03/2023   Procedure: ESOPHAGOGASTRODUODENOSCOPY (EGD) WITH PROPOFOL ;  Surgeon: Cinderella Deatrice FALCON, MD;  [facility];  Service: Endoscopy;  Laterality: N/A;   ORIF ANKLE FRACTURE Left 04/20/2017   Procedure: OPEN REDUCTION INTERNAL FIXATION (ORIF) LEFT ANKLE FRACTURE;  Surgeon: Kay Kemps, MD;  Location: [facility] OR;  Service: Orthopedics;  Laterality: Left;   SCLEROTHERAPY  02/03/2023   Procedure: SCLEROTHERAPY;  Surgeon: Cinderella Deatrice FALCON, MD;  [facility];  Service: Endoscopy;;   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 07/27/2023   Gastritis and  gastroduodenitis 07/27/2023   Impaired ambulation 05/02/2023   Decreased mobility and endurance 05/02/2023   DOE (dyspnea on exertion) 04/27/2023   Elevated liver enzymes 04/26/2023   Cirrhosis of liver without ascites (HCC) 04/26/2023   Metabolic dysfunction-associated steatotic liver disease (MASLD) 04/26/2023   Colon cancer screening 04/26/2023   Acute GI bleeding 02/03/2023   Melena 02/03/2023   Peptic ulcer disease with hemorrhage 02/03/2023   GI bleed 02/02/2023   Adnexal cyst 02/02/2023   Aortic valve stenosis, mild 03/22/2022   Encounter for other orthopedic aftercare 05/09/2017   Persistent atrial fibrillation (HCC)    Arthralgia of left ankle 04/05/2017   Severe obesity (BMI >= 40) (HCC) 07/30/2014   Hyperlipidemia 05/31/2010   HTN (hypertension) 05/31/2010   Diabetes mellitus treated with oral medication (HCC) 05/31/2010   Peripheral edema 05/31/2010    REFERRING PROVIDER: Mary-Margaret Gladis  REFERRING DIAG: Chronic midline low back pain without Sciatica.    Rationale for Evaluation and Treatment: Rehabilitation  THERAPY DIAG:  Other low back pain  Muscle spasm of back  ONSET DATE: Ongoing.    SUBJECTIVE:  SUBJECTIVE STATEMENT: Pt reports 8/10 low back pain today.  Pt states that she almost fell after her last treatment session, tripping on the curb.   PERTINENT HISTORY:  H/o LBP.  PAIN:  Are you having pain? Yes: NPRS scale: 8/10.   Pain location: Lower lumbar region.  Pain description: Ache Aggravating factors: As above. Relieving factors: As above.    PRECAUTIONS: Fall.  Recommend patient use walker at all times.  RED FLAGS: None   WEIGHT BEARING RESTRICTIONS: No  FALLS:  Has patient fallen in last 6 months? Yes. Number of falls 2.  Now using walker.     LIVING ENVIRONMENT: Lives with: lives alone (Family nearby).   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    PLOF: Independent with basic ADLs and Independent with household mobility with device  PATIENT GOALS:Drive bus again and do yardwork.    OBJECTIVE:  Note: Objective measures were completed at Evaluation unless otherwise noted.  DIAGNOSTIC FINDINGS:   07/18/23:  Most notable is the increased lordosis in the lumbar spine with the facet arthritis starting second lumbar vertebrae and transitioning down through S1 with L4-5 grade 1 spondylolisthesis.  The disc spaces are preserved   Impression spondylosis lumbar spine with spondylolisthesis  PATIENT SURVEYS:  ODI:  25/50.   POSTURE: rounded shoulders, forward head, decreased lumbar lordosis, and flexed trunk   PALPATION: Tender to palpation at L4 to S1 and across bilaterally from this region.    LUMBAR ROM:   Full active lumbar flexion and extension limited to 0 degrees.  LOWER EXTREMITY ROM:     WFL.  LOWER EXTREMITY MMT:    Bilateral hip flexion decreased to 4/5 which may be related to pain.    LUMBAR SPECIAL TESTS:  Negative bilateral Slump test.    GAIT: The patient walks in a flexed trunk posture using a FWW with a decrease in step and stride length.    TREATMENT DATE:   09/11/23   EXERCISE LOG  Exercise Repetitions and Resistance Comments  Nustep Lvl 3 x 16 mins        Blank cell = exercise not performed today   Manual Therapy Soft Tissue Mobilization: Lumbar Spine, STW/M to bil lumbar paraspinals with concentration on R > L, pt leaning over elevated plinth for comofrt   Modalities  Date:  Unattended Estim: Lumbar, IFC 80-150 Hz, 20 mins, Pain and Tone  09/06/23:  Nustep level 3 f/b STW/M x 8 minutes f/b IFC at 80-150 Hz on 40% scan x 20 minutes with focus on right lumbar region.  Normal modality  response following removal of modality.  09/04/23                                 EXERCISE LOG  Exercise Repetitions and Resistance Comments  Nustep Lvl 2 x 16 mins        Blank cell = exercise not performed today   Manual Therapy Soft Tissue Mobilization: Lumbar Spine, STW/M to bil lumbar paraspinals with concentration on R > L, pt leaning over elevated plinth for comofrt   Modalities  Date:  Unattended Estim: Lumbar, IFC 80-150 Hz, 20 mins, Pain and Tone    PATIENT EDUCATION:  Education details:  Person educated:  International aid/development worker:  Education comprehension:   HOME EXERCISE PROGRAM:   ASSESSMENT:  CLINICAL IMPRESSION:  Pt arrives for today's treatment session reporting 8/10 right sided low back pain.  Pt states that she tripped on the curb after her last visit and almost fell.  She states she thinks she may have tweaked her back when that happened. STW/M to right lumbar paraspinals and QL to decrease pain and tone.  Normal responses to estim noted upon removal.  Pt reported decreased pain at completion of today's treatment session.  OBJECTIVE IMPAIRMENTS: Abnormal gait, decreased activity tolerance, increased muscle spasms, postural dysfunction, and pain.   ACTIVITY LIMITATIONS: carrying, lifting, bending, standing, and locomotion level  PARTICIPATION LIMITATIONS: meal prep, cleaning, laundry, occupation, and yard work  PERSONAL FACTORS: Time since onset of injury/illness/exacerbation are also affecting patient's functional outcome.   REHAB POTENTIAL: Good  CLINICAL DECISION MAKING: Stable/uncomplicated  EVALUATION COMPLEXITY: Low   GOALS:  SHORT TERM GOALS: Target date: 09/13/23  Ind with an HEP. Baseline: Goal status: INITIAL    LONG TERM GOALS: Target date: 10/11/23  Perform ADL's with pain not > 3/10.  Goal status: INITIAL  2.  Walk a community distance with her FWW with pain not > 3/10.  Goal status: INITIAL  3.  Perform yardwork with pain not >  3/10.  Goal status: INITIAL   PLAN:  PT FREQUENCY: 2x/week  PT DURATION: 6 weeks  PLANNED INTERVENTIONS: 97110-Therapeutic exercises, 97530- Therapeutic activity, [id number]- Neuromuscular re-education, 97535- Self Care, 02859- Manual therapy, G0283- Electrical stimulation (unattended), 97035- Ultrasound, 79439 (1-2 muscles), 20561 (3+ muscles)- Dry Needling, Patient/Family education, and Moist heat.  PLAN FOR NEXT SESSION: Nustep, draw-in progression.  Modalities and STW as needed.    Delon DELENA Gosling, PTA 09/11/2023, 10:47 AM

## 2023-09-12 DIAGNOSIS — L821 Other seborrheic keratosis: Secondary | ICD-10-CM | POA: Diagnosis not present

## 2023-09-12 DIAGNOSIS — L570 Actinic keratosis: Secondary | ICD-10-CM | POA: Diagnosis not present

## 2023-09-12 DIAGNOSIS — D1801 Hemangioma of skin and subcutaneous tissue: Secondary | ICD-10-CM | POA: Diagnosis not present

## 2023-09-12 DIAGNOSIS — L814 Other melanin hyperpigmentation: Secondary | ICD-10-CM | POA: Diagnosis not present

## 2023-09-13 ENCOUNTER — Ambulatory Visit

## 2023-09-13 DIAGNOSIS — M5459 Other low back pain: Secondary | ICD-10-CM | POA: Diagnosis not present

## 2023-09-13 DIAGNOSIS — M62830 Muscle spasm of back: Secondary | ICD-10-CM

## 2023-09-13 NOTE — Therapy (Signed)
 OUTPATIENT PHYSICAL THERAPY THORACOLUMBAR TREATMENT   Patient Name: Ashley [name] MRN: [id number] DOB:19-Sep-1950, 73 y.o., female Today's Date: 09/13/2023  END OF SESSION:  PT End of Session - 09/13/23 0932     Visit Number 5    Number of Visits 12    Date for PT Re-Evaluation 10/11/23    PT Start Time 0930    PT Stop Time 1031    PT Time Calculation (min) 61 min    Activity Tolerance Patient tolerated treatment well    Behavior During Therapy [facility] for tasks assessed/performed          Past Medical History:  Diagnosis Date   A-fib (HCC)    Arthritis    Depression    Diabetes mellitus without complication (HCC)    Dyspnea    Headache    Heart disease    Hyperlipidemia    Hypertension    Hypertension    Past Surgical History:  Procedure Laterality Date   BIOPSY  02/03/2023   Procedure: BIOPSY;  Surgeon: Cinderella Deatrice FALCON, MD;  Location: [facility];  Service: Endoscopy;;   CYST EXCISION Left 03/15/2017   from buttocks   ESOPHAGOGASTRODUODENOSCOPY N/A 07/27/2023   Procedure: EGD (ESOPHAGOGASTRODUODENOSCOPY);  Surgeon: Cinderella Deatrice FALCON, MD;  Location: [facility];  Service: Endoscopy;  Laterality: N/A;  730AM, ASA 3   ESOPHAGOGASTRODUODENOSCOPY (EGD) WITH PROPOFOL  N/A 02/03/2023   Procedure: ESOPHAGOGASTRODUODENOSCOPY (EGD) WITH PROPOFOL ;  Surgeon: Cinderella Deatrice FALCON, MD;  Location: [facility];  Service: Endoscopy;  Laterality: N/A;   ORIF ANKLE FRACTURE Left 04/20/2017   Procedure: OPEN REDUCTION INTERNAL FIXATION (ORIF) LEFT ANKLE FRACTURE;  Surgeon: Kay Kemps, MD;  Location: [facility] OR;  Service: Orthopedics;  Laterality: Left;   SCLEROTHERAPY  02/03/2023   Procedure: SCLEROTHERAPY;  Surgeon: Cinderella Deatrice FALCON, MD;  Location: [facility];  Service: Endoscopy;;   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 07/27/2023   Gastritis and  gastroduodenitis 07/27/2023   Impaired ambulation 05/02/2023   Decreased mobility and endurance 05/02/2023   DOE (dyspnea on exertion) 04/27/2023   Elevated liver enzymes 04/26/2023   Cirrhosis of liver without ascites (HCC) 04/26/2023   Metabolic dysfunction-associated steatotic liver disease (MASLD) 04/26/2023   Colon cancer screening 04/26/2023   Acute GI bleeding 02/03/2023   Melena 02/03/2023   Peptic ulcer disease with hemorrhage 02/03/2023   GI bleed 02/02/2023   Adnexal cyst 02/02/2023   Aortic valve stenosis, mild 03/22/2022   Encounter for other orthopedic aftercare 05/09/2017   Persistent atrial fibrillation (HCC)    Arthralgia of left ankle 04/05/2017   Severe obesity (BMI >= 40) (HCC) 07/30/2014   Hyperlipidemia 05/31/2010   HTN (hypertension) 05/31/2010   Diabetes mellitus treated with oral medication (HCC) 05/31/2010   Peripheral edema 05/31/2010    REFERRING PROVIDER: Mary-Margaret Gladis  REFERRING DIAG: Chronic midline low back pain without Sciatica.    Rationale for Evaluation and Treatment: Rehabilitation  THERAPY DIAG:  Other low back pain  Muscle spasm of back  ONSET DATE: Ongoing.    SUBJECTIVE:  SUBJECTIVE STATEMENT: Pt reports 8/10 low back pain today.  Pt states she just generally doesn't well today and almost cancelled.  Pt reports bil foot and leg tingling and burning.  PERTINENT HISTORY:  H/o LBP.  PAIN:  Are you having pain? Yes: NPRS scale: 8/10.   Pain location: Lower lumbar region.  Pain description: Ache Aggravating factors: As above. Relieving factors: As above.    PRECAUTIONS: Fall.  Recommend patient use walker at all times.  RED FLAGS: None   WEIGHT BEARING RESTRICTIONS: No  FALLS:  Has patient fallen in last 6 months? Yes. Number of  falls 2.  Now using walker.    LIVING ENVIRONMENT: Lives with: lives alone (Family nearby).   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    PLOF: Independent with basic ADLs and Independent with household mobility with device  PATIENT GOALS:Drive bus again and do yardwork.    OBJECTIVE:  Note: Objective measures were completed at Evaluation unless otherwise noted.  DIAGNOSTIC FINDINGS:   07/18/23:  Most notable is the increased lordosis in the lumbar spine with the facet arthritis starting second lumbar vertebrae and transitioning down through S1 with L4-5 grade 1 spondylolisthesis.  The disc spaces are preserved   Impression spondylosis lumbar spine with spondylolisthesis  PATIENT SURVEYS:  ODI:  25/50.   POSTURE: rounded shoulders, forward head, decreased lumbar lordosis, and flexed trunk   PALPATION: Tender to palpation at L4 to S1 and across bilaterally from this region.    LUMBAR ROM:   Full active lumbar flexion and extension limited to 0 degrees.  LOWER EXTREMITY ROM:     WFL.  LOWER EXTREMITY MMT:    Bilateral hip flexion decreased to 4/5 which may be related to pain.    LUMBAR SPECIAL TESTS:  Negative bilateral Slump test.    GAIT: The patient walks in a flexed trunk posture using a FWW with a decrease in step and stride length.    TREATMENT DATE:   09/13/23   EXERCISE LOG  Exercise Repetitions and Resistance Comments  Nustep Lvl 3 x 17 mins   LAQs 2 sets of 10 reps   Seated marches 2 sets of 10 reps    Blank cell = exercise not performed today   Manual Therapy Soft Tissue Mobilization: Lumbar Spine, STW/M to bil lumbar paraspinals with concentration on R > L, pt leaning over elevated plinth for comofrt   Modalities  Date:  Unattended Estim: Lumbar, IFC 80-150 Hz, 15 mins, Pain and Tone Combo: Lumbar, 1.5 w/cm2; 100%, 10 mins, Pain and Tone    09/06/23:  Nustep level 3 f/b STW/M x 8 minutes f/b IFC at 80-150 Hz on 40% scan x 20 minutes with focus on right lumbar region.  Normal modality response following removal of modality.  09/04/23                                 EXERCISE LOG  Exercise Repetitions and Resistance Comments  Nustep Lvl 2 x 16 mins        Blank cell = exercise not performed today   Manual Therapy Soft Tissue Mobilization: Lumbar Spine, STW/M to bil lumbar paraspinals with concentration on R > L, pt leaning over elevated plinth for comofrt   Modalities  Date:  Unattended Estim: Lumbar, IFC 80-150 Hz, 20 mins, Pain and  Tone    PATIENT EDUCATION:  Education details:  Person educated:  International aid/development worker:  Education comprehension:   HOME EXERCISE PROGRAM: [email or web address]  Access Code: [email or web address]  ASSESSMENT:  CLINICAL IMPRESSION: Pt arrives for today's treatment session reporting 8/10 right sided low back pain.  Pt reports bil foot and LE tingling and burning sensations.  Pt encouraged to contact her PCP or podiatrist concerning these new pains. Pt also states that she just generally doesn't feel well today.  Pt instructed in use of home TENS unit with pt able to demonstrate and verbalize understanding.  STW/M performed to right lumbar musculature to decrease pain and tone.  Pt given printed HEP.  Normal responses to estim noted upon removal.  Pt reported decreased pain at completion of today's treatment session.   OBJECTIVE IMPAIRMENTS: Abnormal gait, decreased activity tolerance, increased muscle spasms, postural dysfunction, and pain.   ACTIVITY LIMITATIONS: carrying, lifting, bending, standing, and locomotion level  PARTICIPATION LIMITATIONS: meal prep, cleaning, laundry, occupation, and yard work  PERSONAL FACTORS: Time since onset of injury/illness/exacerbation are also affecting patient's functional outcome.   REHAB POTENTIAL: Good  CLINICAL DECISION MAKING:  Stable/uncomplicated  EVALUATION COMPLEXITY: Low   GOALS:  SHORT TERM GOALS: Target date: 09/13/23  Ind with an HEP. Baseline: Goal status: MET  LONG TERM GOALS: Target date: 10/11/23  Perform ADL's with pain not > 3/10.  Goal status: IN PROGRESS  2.  Walk a community distance with her FWW with pain not > 3/10.  Goal status: IN PROGRESS  3.  Perform yardwork with pain not > 3/10.  Goal status: IN PROGRESS   PLAN:  PT FREQUENCY: 2x/week  PT DURATION: 6 weeks  PLANNED INTERVENTIONS: 97110-Therapeutic exercises, 97530- Therapeutic activity, [id number]- Neuromuscular re-education, 97535- Self Care, [id number]- Manual therapy, [id number]- Electrical stimulation (unattended), 97035- Ultrasound, 79439 (1-2 muscles), 20561 (3+ muscles)- Dry Needling, Patient/Family education, and Moist heat.  PLAN FOR NEXT SESSION: Nustep, draw-in progression.  Modalities and STW as needed.    Delon DELENA Gosling, PTA 09/13/2023, 11:00 AM

## 2023-09-15 ENCOUNTER — Ambulatory Visit (INDEPENDENT_AMBULATORY_CARE_PROVIDER_SITE_OTHER): Admitting: Nurse Practitioner

## 2023-09-15 ENCOUNTER — Encounter: Payer: Self-pay | Admitting: Nurse Practitioner

## 2023-09-15 VITALS — BP 138/55 | HR 57 | Temp 97.9°F | Ht 62.0 in | Wt 267.0 lb

## 2023-09-15 DIAGNOSIS — R600 Localized edema: Secondary | ICD-10-CM

## 2023-09-15 DIAGNOSIS — E0843 Diabetes mellitus due to underlying condition with diabetic autonomic (poly)neuropathy: Secondary | ICD-10-CM | POA: Diagnosis not present

## 2023-09-15 MED ORDER — GABAPENTIN 100 MG PO CAPS: 100.0000 mg | ORAL_CAPSULE | Freq: Three times a day (TID) | ORAL | 3 refills | Status: DC

## 2023-09-15 NOTE — Patient Instructions (Signed)
 Peripheral Neuropathy Peripheral neuropathy is a type of nerve damage. It affects nerves that carry signals between the spinal cord and the arms, legs, and the rest of the body (peripheral nerves). It does not affect nerves in the spinal cord or brain. In peripheral neuropathy, one nerve or a group of nerves may be damaged. Peripheral neuropathy is a broad category that includes many specific nerve disorders, like diabetic neuropathy, hereditary neuropathy, and carpal tunnel syndrome. What are the causes? This condition may be caused by: Certain diseases, such as: Diabetes. This is the most common cause of peripheral neuropathy. Autoimmune diseases, such as rheumatoid arthritis and systemic lupus erythematosus. Nerve diseases that are passed from parent to child (inherited). Kidney disease. Thyroid disease. Other causes may include: Nerve injury. Pressure or stress on a nerve that lasts a long time. Lack (deficiency) of B vitamins. This can result from alcoholism, poor diet, or a restricted diet. Infections. Some medicines, such as cancer medicines (chemotherapy). Poisonous (toxic) substances, such as lead and mercury. Too little blood flowing to the legs. In some cases, the cause of this condition is not known. What are the signs or symptoms? Symptoms of this condition depend on which of your nerves is damaged. Symptoms in the legs, hands, and arms can include: Loss of feeling (numbness) in the feet, hands, or both. Tingling in the feet, hands, or both. Burning pain. Very sensitive skin. Weakness. Not being able to move a part of the body (paralysis). Clumsiness or poor coordination. Muscle twitching. Loss of balance. Symptoms in other parts of the body can include: Not being able to control your bladder. Feeling dizzy. Sexual problems. How is this diagnosed? Diagnosing and finding the cause of peripheral neuropathy can be difficult. Your health care provider will take your  medical history and do a physical exam. A neurological exam will also be done. This involves checking things that are affected by your brain, spinal cord, and nerves (nervous system). For example, your health care provider will check your reflexes, how you move, and what you can feel. You may have other tests, such as: Blood tests. Electromyogram (EMG) and nerve conduction tests. These tests check nerve function and how well the nerves are controlling the muscles. Imaging tests, such as a CT scan or MRI, to rule out other causes of your symptoms. Removing a small piece of nerve to be examined in a lab (nerve biopsy). Removing and examining a small amount of the fluid that surrounds the brain and spinal cord (lumbar puncture). How is this treated? Treatment for this condition may involve: Treating the underlying cause of the neuropathy, such as diabetes, kidney disease, or vitamin deficiencies. Stopping medicines that can cause neuropathy, such as chemotherapy. Medicine to help relieve pain. Medicines may include: Prescription or over-the-counter pain medicine. Anti-seizure medicine. Antidepressants. Pain-relieving patches that are applied to painful areas of skin. Surgery to relieve pressure on a nerve or to destroy a nerve that is causing pain. Physical therapy to help improve movement and balance. Devices to help you move around (assistive devices). Follow these instructions at home: Medicines Take over-the-counter and prescription medicines only as told by your health care provider. Do not take any other medicines without first asking your health care provider. Ask your health care provider if the medicine prescribed to you requires you to avoid driving or using machinery. Lifestyle  Do not use any products that contain nicotine or tobacco. These products include cigarettes, chewing tobacco, and vaping devices, such as e-cigarettes. Smoking keeps  blood from reaching damaged nerves. If you  need help quitting, ask your health care provider. Avoid or limit alcohol. Too much alcohol can cause a vitamin B deficiency, and vitamin B is needed for healthy nerves. Eat a healthy diet. This includes: Eating foods that are high in fiber, such as beans, whole grains, and fresh fruits and vegetables. Limiting foods that are high in fat and processed sugars, such as fried or sweet foods. General instructions  If you have diabetes, work closely with your health care provider to keep your blood sugar under control. If you have numbness in your feet: Check every day for signs of injury or infection. Watch for redness, warmth, and swelling. Wear padded socks and comfortable shoes. These help protect your feet. Develop a good support system. Living with peripheral neuropathy can be stressful. Consider talking with a mental health specialist or joining a support group. Use assistive devices and attend physical therapy as told by your health care provider. This may include using a walker or a cane. Keep all follow-up visits. This is important. Where to find more information [employer] of Neurological Disorders: [employer] Contact a health care provider if: You have new signs or symptoms of peripheral neuropathy. You are struggling emotionally from dealing with peripheral neuropathy. Your pain is not well controlled. Get help right away if: You have an injury or infection that is not healing normally. You develop new weakness in an arm or leg. You have fallen or do so frequently. Summary Peripheral neuropathy is when the nerves in the arms or legs are damaged, resulting in numbness, weakness, or pain. There are many causes of peripheral neuropathy, including diabetes, pinched nerves, vitamin deficiencies, autoimmune disease, and hereditary conditions. Diagnosing and finding the cause of peripheral neuropathy can be difficult. Your health care provider will take your medical  history, do a physical exam, and do tests, including blood tests and nerve function tests. Treatment involves treating the underlying cause of the neuropathy and taking medicines to help control pain. Physical therapy and assistive devices may also help. This information is not intended to replace advice given to you by your health care provider. Make sure you discuss any questions you have with your health care provider. Document Revised: 09/29/2020 Document Reviewed: 09/29/2020 Elsevier Patient Education  2024 [employer].

## 2023-09-15 NOTE — Progress Notes (Signed)
 Subjective:    Patient ID: Ashley [name], female    DOB: [birth year], 73 y.o.   MRN: [id number]   Chief Complaint: Legs red and swollen   HPI  Patient in c/o feet swelling daily. Will usually go down some at night. Has constant burning sensation in bil feet.  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 07/27/2023   Gastritis and gastroduodenitis 07/27/2023   Impaired ambulation 05/02/2023   Decreased mobility and endurance 05/02/2023   DOE (dyspnea on exertion) 04/27/2023   Elevated liver enzymes 04/26/2023   Cirrhosis of liver without ascites (HCC) 04/26/2023   Metabolic dysfunction-associated steatotic liver disease (MASLD) 04/26/2023   Colon cancer screening 04/26/2023   Acute GI bleeding 02/03/2023   Melena 02/03/2023   Peptic ulcer disease with hemorrhage 02/03/2023   GI bleed 02/02/2023   Adnexal cyst 02/02/2023   Aortic valve stenosis, mild 03/22/2022   Encounter for other orthopedic aftercare 05/09/2017   Persistent atrial fibrillation (HCC)    Arthralgia of left ankle 04/05/2017   Severe obesity (BMI >= 40) (HCC) 07/30/2014   Hyperlipidemia 05/31/2010   HTN (hypertension) 05/31/2010   Diabetes mellitus treated with oral medication (HCC) 05/31/2010   Peripheral edema 05/31/2010       Review of Systems  Constitutional:  Negative for diaphoresis.  Eyes:  Negative for pain.  Respiratory:  Negative for shortness of breath.   Cardiovascular:  Positive for leg swelling. Negative for chest pain and palpitations.  Gastrointestinal:  Negative for abdominal pain.  Endocrine: Negative for polydipsia.  Skin:  Negative for rash.  Neurological:  Negative for dizziness, weakness and headaches.  Hematological:  Does not bruise/bleed easily.  All other systems reviewed and are negative.      Objective:   Physical Exam Vitals reviewed.   Constitutional:      Appearance: Normal appearance. She is obese.  Cardiovascular:     Rate and Rhythm: Normal rate and regular rhythm.     Heart sounds: Normal heart sounds.  Pulmonary:     Effort: Pulmonary effort is normal.     Breath sounds: Normal breath sounds.  Skin:    General: Skin is warm.  Neurological:     General: No focal deficit present.     Mental Status: She is alert and oriented to person, place, and time.  Psychiatric:        Behavior: Behavior normal.    BP (!) 138/55   Pulse (!) 57   Temp 97.9 F (36.6 C) (Temporal)   Ht 5' 2 (1.575 m)   Wt 267 lb (121.1 kg)   SpO2 97%   BMI 48.83 kg/m         Assessment & Plan:   Ashley [name] in today with chief complaint of Legs red and swollen   1. Mild peripheral edema (Primary) Elevate legs when sitting Compression socks daily  2. 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  Dispense: 90 capsule; Refill: 3    The above assessment and management plan was discussed with the patient. The patient verbalized understanding of and has agreed to the management plan. Patient is aware to call the clinic if symptoms persist or worsen. Patient  is aware when to return to the clinic for a follow-up visit. Patient educated on when it is appropriate to go to the emergency department.   Mary-Margaret Gladis, FNP

## 2023-09-19 ENCOUNTER — Ambulatory Visit: Admitting: Physical Therapy

## 2023-09-19 DIAGNOSIS — M62830 Muscle spasm of back: Secondary | ICD-10-CM

## 2023-09-19 DIAGNOSIS — M5459 Other low back pain: Secondary | ICD-10-CM | POA: Diagnosis not present

## 2023-09-19 NOTE — Therapy (Signed)
 OUTPATIENT PHYSICAL THERAPY THORACOLUMBAR TREATMENT   Patient Name: Ashley [name] MRN: [id number] DOB:04/08/1950, 73 y.o., female Today's Date: 09/19/2023  END OF SESSION:  PT End of Session - 09/19/23 1003     Visit Number 6    Number of Visits 12    Date for PT Re-Evaluation 10/11/23    PT Start Time 0930    PT Stop Time 1024    PT Time Calculation (min) 54 min    Activity Tolerance Patient tolerated treatment well    Behavior During Therapy [facility] for tasks assessed/performed           Past Medical History:  Diagnosis Date   A-fib (HCC)    Arthritis    Depression    Diabetes mellitus without complication (HCC)    Dyspnea    Headache    Heart disease    Hyperlipidemia    Hypertension    Hypertension    Past Surgical History:  Procedure Laterality Date   BIOPSY  02/03/2023   Procedure: BIOPSY;  Surgeon: Cinderella Deatrice FALCON, MD;  [facility];  Service: Endoscopy;;   CYST EXCISION Left 03/15/2017   from buttocks   ESOPHAGOGASTRODUODENOSCOPY N/A 07/27/2023   Procedure: EGD (ESOPHAGOGASTRODUODENOSCOPY);  Surgeon: Cinderella Deatrice FALCON, MD;  [facility];  Service: Endoscopy;  Laterality: N/A;  730AM, ASA 3   ESOPHAGOGASTRODUODENOSCOPY (EGD) WITH PROPOFOL  N/A 02/03/2023   Procedure: ESOPHAGOGASTRODUODENOSCOPY (EGD) WITH PROPOFOL ;  Surgeon: Cinderella Deatrice FALCON, MD;  [facility];  Service: Endoscopy;  Laterality: N/A;   ORIF ANKLE FRACTURE Left 04/20/2017   Procedure: OPEN REDUCTION INTERNAL FIXATION (ORIF) LEFT ANKLE FRACTURE;  Surgeon: Kay Kemps, MD;  Location: [facility];  Service: Orthopedics;  Laterality: Left;   SCLEROTHERAPY  02/03/2023   Procedure: SCLEROTHERAPY;  Surgeon: Cinderella Deatrice FALCON, MD;  [facility];  Service: Endoscopy;;   Patient Active Problem List   Diagnosis Date Noted   Diabetic autonomic neuropathy associated with diabetes mellitus due to underlying condition (HCC) 09/15/2023   Iron deficiency anemia due to  chronic blood loss 08/09/2023   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 07/27/2023   Gastritis and gastroduodenitis 07/27/2023   Impaired ambulation 05/02/2023   Decreased mobility and endurance 05/02/2023   DOE (dyspnea on exertion) 04/27/2023   Elevated liver enzymes 04/26/2023   Cirrhosis of liver without ascites (HCC) 04/26/2023   Metabolic dysfunction-associated steatotic liver disease (MASLD) 04/26/2023   Colon cancer screening 04/26/2023   Acute GI bleeding 02/03/2023   Melena 02/03/2023   Peptic ulcer disease with hemorrhage 02/03/2023   GI bleed 02/02/2023   Adnexal cyst 02/02/2023   Aortic valve stenosis, mild 03/22/2022   Encounter for other orthopedic aftercare 05/09/2017   Persistent atrial fibrillation (HCC)    Arthralgia of left ankle 04/05/2017   Severe obesity (BMI >= 40) (HCC) 07/30/2014   Hyperlipidemia 05/31/2010   HTN (hypertension) 05/31/2010   Diabetes mellitus treated with oral medication (HCC) 05/31/2010   Mild peripheral edema 05/31/2010    REFERRING PROVIDER: Mary-Margaret Gladis  REFERRING DIAG: Chronic midline low back pain without Sciatica.    Rationale for Evaluation and Treatment: Rehabilitation  THERAPY DIAG:  Other low back pain  Muscle spasm of back  ONSET DATE: Ongoing.    SUBJECTIVE:  SUBJECTIVE STATEMENT: Not too bad today.  Still can't stand long.    PERTINENT HISTORY:  H/o LBP.  PAIN:  Are you having pain? Yes: NPRS scale: See above./10.   Pain location: Lower lumbar region.  Pain description: Ache Aggravating factors: As above. Relieving factors: As above.    PRECAUTIONS: Fall.  Recommend patient use walker at all times.  RED FLAGS: None   WEIGHT BEARING RESTRICTIONS: No  FALLS:  Has patient fallen in last 6 months?  Yes. Number of falls 2.  Now using walker.    LIVING ENVIRONMENT: Lives with: lives alone (Family nearby).   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    PLOF: Independent with basic ADLs and Independent with household mobility with device  PATIENT GOALS:Drive bus again and do yardwork.    OBJECTIVE:  Note: Objective measures were completed at Evaluation unless otherwise noted.  DIAGNOSTIC FINDINGS:   07/18/23:  Most notable is the increased lordosis in the lumbar spine with the facet arthritis starting second lumbar vertebrae and transitioning down through S1 with L4-5 grade 1 spondylolisthesis.  The disc spaces are preserved   Impression spondylosis lumbar spine with spondylolisthesis  PATIENT SURVEYS:  ODI:  25/50.   POSTURE: rounded shoulders, forward head, decreased lumbar lordosis, and flexed trunk   PALPATION: Tender to palpation at L4 to S1 and across bilaterally from this region.    LUMBAR ROM:   Full active lumbar flexion and extension limited to 0 degrees.  LOWER EXTREMITY ROM:     WFL.  LOWER EXTREMITY MMT:    Bilateral hip flexion decreased to 4/5 which may be related to pain.    LUMBAR SPECIAL TESTS:  Negative bilateral Slump test.    GAIT: The patient walks in a flexed trunk posture using a FWW with a decrease in step and stride length.    TREATMENT DATE:   09/19/23:  Nustep level 3 x 15 minutes f/b patient in supine 1-1 stretching into bilateral SKTC, DKTC and bilateral rotation (8 minutes total) f/b IFC at 80-150 Hz on 100% scan x 20 minutes.  Normal modality response following removal of modality  09/13/23   EXERCISE LOG  Exercise Repetitions and Resistance Comments  Nustep Lvl 3 x 17 mins   LAQs 2 sets of 10 reps   Seated marches 2 sets of 10 reps    Blank cell = exercise not performed today   Manual Therapy Soft Tissue Mobilization: Lumbar  Spine, STW/M to bil lumbar paraspinals with concentration on R > L, pt leaning over elevated plinth for comofrt   Modalities  Date:  Unattended Estim: Lumbar, IFC 80-150 Hz, 15 mins, Pain and Tone Combo: Lumbar, 1.5 w/cm2; 100%, 10 mins, Pain and Tone   09/06/23:  Nustep level 3 f/b STW/M x 8 minutes f/b IFC at 80-150 Hz on 40% scan x 20 minutes with focus on right lumbar region.  Normal modality response following removal of modality.  09/04/23                                 EXERCISE LOG  Exercise Repetitions and Resistance Comments  Nustep Lvl 2 x 16 mins        Blank cell = exercise not performed today   Manual Therapy Soft Tissue Mobilization: Lumbar Spine, STW/M to bil lumbar paraspinals with concentration  on R > L, pt leaning over elevated plinth for comofrt   Modalities  Date:  Unattended Estim: Lumbar, IFC 80-150 Hz, 20 mins, Pain and Tone    PATIENT EDUCATION:  Education details:  Person educated:  International aid/development worker:  Education comprehension:   HOME EXERCISE PROGRAM: [email or web address]  Access Code: [email or web address]  ASSESSMENT:  CLINICAL IMPRESSION: Patient did very well with 1-1 stretching, tolerating without complaint.     OBJECTIVE IMPAIRMENTS: Abnormal gait, decreased activity tolerance, increased muscle spasms, postural dysfunction, and pain.   ACTIVITY LIMITATIONS: carrying, lifting, bending, standing, and locomotion level  PARTICIPATION LIMITATIONS: meal prep, cleaning, laundry, occupation, and yard work  PERSONAL FACTORS: Time since onset of injury/illness/exacerbation are also affecting patient's functional outcome.   REHAB POTENTIAL: Good  CLINICAL DECISION MAKING: Stable/uncomplicated  EVALUATION COMPLEXITY: Low   GOALS:  SHORT TERM GOALS: Target date: 09/13/23  Ind with an HEP. Baseline: Goal status: MET  LONG TERM GOALS: Target date: 10/11/23  Perform ADL's with pain not > 3/10.  Goal status: IN PROGRESS  2.  Walk a  community distance with her FWW with pain not > 3/10.  Goal status: IN PROGRESS  3.  Perform yardwork with pain not > 3/10.  Goal status: IN PROGRESS   PLAN:  PT FREQUENCY: 2x/week  PT DURATION: 6 weeks  PLANNED INTERVENTIONS: 97110-Therapeutic exercises, 97530- Therapeutic activity, [id number]- Neuromuscular re-education, 97535- Self Care, [id number]- Manual therapy, [id number]- Electrical stimulation (unattended), 97035- Ultrasound, 79439 (1-2 muscles), 20561 (3+ muscles)- Dry Needling, Patient/Family education, and Moist heat.  PLAN FOR NEXT SESSION: Nustep, draw-in progression.  Modalities and STW as needed.    Dazani Norby, [place], PT 09/19/2023, 10:51 AM

## 2023-09-22 ENCOUNTER — Ambulatory Visit: Admitting: Physical Therapy

## 2023-09-22 DIAGNOSIS — M62830 Muscle spasm of back: Secondary | ICD-10-CM

## 2023-09-22 DIAGNOSIS — M5459 Other low back pain: Secondary | ICD-10-CM | POA: Diagnosis not present

## 2023-09-22 NOTE — Therapy (Signed)
 OUTPATIENT PHYSICAL THERAPY THORACOLUMBAR TREATMENT   Patient Name: Ashley [name] MRN: [id number] DOB:Mar 01, 1950, 73 y.o., female Today's Date: 09/22/2023  END OF SESSION:  PT End of Session - 09/22/23 1025     Visit Number 7    Number of Visits 12    Date for PT Re-Evaluation 10/11/23    PT Start Time 1016    PT Stop Time 1111    PT Time Calculation (min) 55 min    Activity Tolerance Patient tolerated treatment well    Behavior During Therapy [facility] for tasks assessed/performed           Past Medical History:  Diagnosis Date   A-fib (HCC)    Arthritis    Depression    Diabetes mellitus without complication (HCC)    Dyspnea    Headache    Heart disease    Hyperlipidemia    Hypertension    Hypertension    Past Surgical History:  Procedure Laterality Date   BIOPSY  02/03/2023   Procedure: BIOPSY;  Surgeon: Cinderella Deatrice FALCON, MD;  [facility];  Service: Endoscopy;;   CYST EXCISION Left 03/15/2017   from buttocks   ESOPHAGOGASTRODUODENOSCOPY N/A 07/27/2023   Procedure: EGD (ESOPHAGOGASTRODUODENOSCOPY);  Surgeon: Cinderella Deatrice FALCON, MD;  [facility];  Service: Endoscopy;  Laterality: N/A;  730AM, ASA 3   ESOPHAGOGASTRODUODENOSCOPY (EGD) WITH PROPOFOL  N/A 02/03/2023   Procedure: ESOPHAGOGASTRODUODENOSCOPY (EGD) WITH PROPOFOL ;  Surgeon: Cinderella Deatrice FALCON, MD;  [facility];  Service: Endoscopy;  Laterality: N/A;   ORIF ANKLE FRACTURE Left 04/20/2017   Procedure: OPEN REDUCTION INTERNAL FIXATION (ORIF) LEFT ANKLE FRACTURE;  Surgeon: Kay Kemps, MD;  Location: [facility] OR;  Service: Orthopedics;  Laterality: Left;   SCLEROTHERAPY  02/03/2023   Procedure: SCLEROTHERAPY;  Surgeon: Cinderella Deatrice FALCON, MD;  [facility];  Service: Endoscopy;;   Patient Active Problem List   Diagnosis Date Noted   Diabetic autonomic neuropathy associated with diabetes mellitus due to underlying condition (HCC) 09/15/2023   Iron deficiency anemia due to  chronic blood loss 08/09/2023   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 07/27/2023   Gastritis and gastroduodenitis 07/27/2023   Impaired ambulation 05/02/2023   Decreased mobility and endurance 05/02/2023   DOE (dyspnea on exertion) 04/27/2023   Elevated liver enzymes 04/26/2023   Cirrhosis of liver without ascites (HCC) 04/26/2023   Metabolic dysfunction-associated steatotic liver disease (MASLD) 04/26/2023   Colon cancer screening 04/26/2023   Acute GI bleeding 02/03/2023   Melena 02/03/2023   Peptic ulcer disease with hemorrhage 02/03/2023   GI bleed 02/02/2023   Adnexal cyst 02/02/2023   Aortic valve stenosis, mild 03/22/2022   Encounter for other orthopedic aftercare 05/09/2017   Persistent atrial fibrillation (HCC)    Arthralgia of left ankle 04/05/2017   Severe obesity (BMI >= 40) (HCC) 07/30/2014   Hyperlipidemia 05/31/2010   HTN (hypertension) 05/31/2010   Diabetes mellitus treated with oral medication (HCC) 05/31/2010   Mild peripheral edema 05/31/2010    REFERRING PROVIDER: Mary-Margaret Gladis  REFERRING DIAG: Chronic midline low back pain without Sciatica.    Rationale for Evaluation and Treatment: Rehabilitation  THERAPY DIAG:  Other low back pain  Muscle spasm of back  ONSET DATE: Ongoing.    SUBJECTIVE:  SUBJECTIVE STATEMENT: Able to stand and cook some now.  PERTINENT HISTORY:  H/o LBP.  PAIN:  Are you having pain? Yes: NPRS scale: See above./10.   Pain location: Lower lumbar region.  Pain description: Ache Aggravating factors: As above. Relieving factors: As above.    PRECAUTIONS: Fall.  Recommend patient use walker at all times.  RED FLAGS: None   WEIGHT BEARING RESTRICTIONS: No  FALLS:  Has patient fallen in last 6 months? Yes. Number of  falls 2.  Now using walker.    LIVING ENVIRONMENT: Lives with: lives alone (Family nearby).   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    PLOF: Independent with basic ADLs and Independent with household mobility with device  PATIENT GOALS:Drive bus again and do yardwork.    OBJECTIVE:  Note: Objective measures were completed at Evaluation unless otherwise noted.  DIAGNOSTIC FINDINGS:   07/18/23:  Most notable is the increased lordosis in the lumbar spine with the facet arthritis starting second lumbar vertebrae and transitioning down through S1 with L4-5 grade 1 spondylolisthesis.  The disc spaces are preserved   Impression spondylosis lumbar spine with spondylolisthesis  PATIENT SURVEYS:  ODI:  25/50.   POSTURE: rounded shoulders, forward head, decreased lumbar lordosis, and flexed trunk   PALPATION: Tender to palpation at L4 to S1 and across bilaterally from this region.    LUMBAR ROM:   Full active lumbar flexion and extension limited to 0 degrees.  LOWER EXTREMITY ROM:     WFL.  LOWER EXTREMITY MMT:    Bilateral hip flexion decreased to 4/5 which may be related to pain.    LUMBAR SPECIAL TESTS:  Negative bilateral Slump test.    GAIT: The patient walks in a flexed trunk posture using a FWW with a decrease in step and stride length.    TREATMENT DATE:   09/22/23:  Nustep level 3 x 15 minutes f/b patient in supine 1-1 stretching into bilateral SKTC (2 mins each side), DKTC(2 mins each side) and bilateral rotation(2 mins each side) (10 minutes total) f/b IFC at 80-150 Hz on 40% scan x 20 minutes.  Normal modality response following removal of modality.  09/19/23:  Nustep level 3 x 15 minutes f/b patient in supine 1-1 stretching into bilateral SKTC, DKTC and bilateral rotation (8 minutes total) f/b IFC at 80-150 Hz on 100% scan x 20 minutes.  Normal modality response  following removal of modality  09/13/23   EXERCISE LOG  Exercise Repetitions and Resistance Comments  Nustep Lvl 3 x 17 mins   LAQs 2 sets of 10 reps   Seated marches 2 sets of 10 reps    Blank cell = exercise not performed today   Manual Therapy Soft Tissue Mobilization: Lumbar Spine, STW/M to bil lumbar paraspinals with concentration on R > L, pt leaning over elevated plinth for comofrt   Modalities  Date:  Unattended Estim: Lumbar, IFC 80-150 Hz, 15 mins, Pain and Tone Combo: Lumbar, 1.5 w/cm2; 100%, 10 mins, Pain and Tone   09/06/23:  Nustep level 3 f/b STW/M x 8 minutes f/b IFC at 80-150 Hz on 40% scan x 20 minutes with focus on right lumbar region.  Normal modality response following removal of modality.  09/04/23  EXERCISE LOG  Exercise Repetitions and Resistance Comments  Nustep Lvl 2 x 16 mins        Blank cell = exercise not performed today   Manual Therapy Soft Tissue Mobilization: Lumbar Spine, STW/M to bil lumbar paraspinals with concentration on R > L, pt leaning over elevated plinth for comofrt   Modalities  Date:  Unattended Estim: Lumbar, IFC 80-150 Hz, 20 mins, Pain and Tone    PATIENT EDUCATION:  Education details:  Person educated:  International aid/development worker:  Education comprehension:   HOME EXERCISE PROGRAM: [email or web address]  Access Code: [email or web address]  ASSESSMENT:  CLINICAL IMPRESSION: Patient did well with last treatment and states she was able to stand longer while cooking.  She felt good after today's treatment.    OBJECTIVE IMPAIRMENTS: Abnormal gait, decreased activity tolerance, increased muscle spasms, postural dysfunction, and pain.   ACTIVITY LIMITATIONS: carrying, lifting, bending, standing, and locomotion level  PARTICIPATION LIMITATIONS: meal prep, cleaning, laundry, occupation, and yard work  PERSONAL FACTORS: Time since onset of injury/illness/exacerbation are also affecting patient's  functional outcome.   REHAB POTENTIAL: Good  CLINICAL DECISION MAKING: Stable/uncomplicated  EVALUATION COMPLEXITY: Low   GOALS:  SHORT TERM GOALS: Target date: 09/13/23  Ind with an HEP. Baseline: Goal status: MET  LONG TERM GOALS: Target date: 10/11/23  Perform ADL's with pain not > 3/10.  Goal status: IN PROGRESS  2.  Walk a community distance with her FWW with pain not > 3/10.  Goal status: IN PROGRESS  3.  Perform yardwork with pain not > 3/10.  Goal status: IN PROGRESS   PLAN:  PT FREQUENCY: 2x/week  PT DURATION: 6 weeks  PLANNED INTERVENTIONS: 97110-Therapeutic exercises, 97530- Therapeutic activity, [id number]- Neuromuscular re-education, 97535- Self Care, 02859- Manual therapy, [id number]- Electrical stimulation (unattended), 97035- Ultrasound, 79439 (1-2 muscles), 20561 (3+ muscles)- Dry Needling, Patient/Family education, and Moist heat.  PLAN FOR NEXT SESSION: Nustep, draw-in progression.  Modalities and STW as needed.    Josiane Labine, [place], PT 09/22/2023, 11:20 AM

## 2023-09-25 ENCOUNTER — Ambulatory Visit: Admitting: Physical Therapy

## 2023-09-25 DIAGNOSIS — M62830 Muscle spasm of back: Secondary | ICD-10-CM

## 2023-09-25 DIAGNOSIS — M5459 Other low back pain: Secondary | ICD-10-CM | POA: Diagnosis not present

## 2023-09-25 NOTE — Therapy (Signed)
 OUTPATIENT PHYSICAL THERAPY THORACOLUMBAR TREATMENT   Patient Name: Ashley [name] MRN: [id number] DOB:02/02/1951, 73 y.o., female Today's Date: 09/25/2023  END OF SESSION:  PT End of Session - 09/25/23 1056     Visit Number 8    Number of Visits 12    Date for PT Re-Evaluation 10/11/23    PT Start Time 0930    PT Stop Time 1028    PT Time Calculation (min) 58 min    Activity Tolerance Patient tolerated treatment well    Behavior During Therapy [facility] for tasks assessed/performed           Past Medical History:  Diagnosis Date   A-fib (HCC)    Arthritis    Depression    Diabetes mellitus without complication (HCC)    Dyspnea    Headache    Heart disease    Hyperlipidemia    Hypertension    Hypertension    Past Surgical History:  Procedure Laterality Date   BIOPSY  02/03/2023   Procedure: BIOPSY;  Surgeon: Cinderella Deatrice FALCON, MD;  [facility];  Service: Endoscopy;;   CYST EXCISION Left 03/15/2017   from buttocks   ESOPHAGOGASTRODUODENOSCOPY N/A 07/27/2023   Procedure: EGD (ESOPHAGOGASTRODUODENOSCOPY);  Surgeon: Cinderella Deatrice FALCON, MD;  [facility];  Service: Endoscopy;  Laterality: N/A;  730AM, ASA 3   ESOPHAGOGASTRODUODENOSCOPY (EGD) WITH PROPOFOL  N/A 02/03/2023   Procedure: ESOPHAGOGASTRODUODENOSCOPY (EGD) WITH PROPOFOL ;  Surgeon: Cinderella Deatrice FALCON, MD;  [facility];  Service: Endoscopy;  Laterality: N/A;   ORIF ANKLE FRACTURE Left 04/20/2017   Procedure: OPEN REDUCTION INTERNAL FIXATION (ORIF) LEFT ANKLE FRACTURE;  Surgeon: Kay Kemps, MD;  Location: [facility];  Service: Orthopedics;  Laterality: Left;   SCLEROTHERAPY  02/03/2023   Procedure: SCLEROTHERAPY;  Surgeon: Cinderella Deatrice FALCON, MD;  [facility];  Service: Endoscopy;;   Patient Active Problem List   Diagnosis Date Noted   Diabetic autonomic neuropathy associated with diabetes mellitus due to underlying condition (HCC) 09/15/2023   Iron deficiency anemia due to  chronic blood loss 08/09/2023   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 07/27/2023   Gastritis and gastroduodenitis 07/27/2023   Impaired ambulation 05/02/2023   Decreased mobility and endurance 05/02/2023   DOE (dyspnea on exertion) 04/27/2023   Elevated liver enzymes 04/26/2023   Cirrhosis of liver without ascites (HCC) 04/26/2023   Metabolic dysfunction-associated steatotic liver disease (MASLD) 04/26/2023   Colon cancer screening 04/26/2023   Acute GI bleeding 02/03/2023   Melena 02/03/2023   Peptic ulcer disease with hemorrhage 02/03/2023   GI bleed 02/02/2023   Adnexal cyst 02/02/2023   Aortic valve stenosis, mild 03/22/2022   Encounter for other orthopedic aftercare 05/09/2017   Persistent atrial fibrillation (HCC)    Arthralgia of left ankle 04/05/2017   Severe obesity (BMI >= 40) (HCC) 07/30/2014   Hyperlipidemia 05/31/2010   HTN (hypertension) 05/31/2010   Diabetes mellitus treated with oral medication (HCC) 05/31/2010   Mild peripheral edema 05/31/2010    REFERRING PROVIDER: Mary-Margaret Gladis  REFERRING DIAG: Chronic midline low back pain without Sciatica.    Rationale for Evaluation and Treatment: Rehabilitation  THERAPY DIAG:  Other low back pain  Muscle spasm of back  ONSET DATE: Ongoing.    SUBJECTIVE:  SUBJECTIVE STATEMENT: Doing better.  Walking more without walker at home. Pain nat a 3.  PERTINENT HISTORY:  H/o LBP.  PAIN:  Are you having pain? Yes: NPRS scale: 3/10.   Pain location: Lower lumbar region.  Pain description: Ache Aggravating factors: As above. Relieving factors: As above.    PRECAUTIONS: Fall.  Recommend patient use walker at all times.  RED FLAGS: None   WEIGHT BEARING RESTRICTIONS: No  FALLS:  Has patient fallen in last 6  months? Yes. Number of falls 2.  Now using walker.    LIVING ENVIRONMENT: Lives with: lives alone (Family nearby).   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    PLOF: Independent with basic ADLs and Independent with household mobility with device  PATIENT GOALS:Drive bus again and do yardwork.    OBJECTIVE:  Note: Objective measures were completed at Evaluation unless otherwise noted.  DIAGNOSTIC FINDINGS:   07/18/23:  Most notable is the increased lordosis in the lumbar spine with the facet arthritis starting second lumbar vertebrae and transitioning down through S1 with L4-5 grade 1 spondylolisthesis.  The disc spaces are preserved   Impression spondylosis lumbar spine with spondylolisthesis  PATIENT SURVEYS:  ODI:  25/50.   POSTURE: rounded shoulders, forward head, decreased lumbar lordosis, and flexed trunk   PALPATION: Tender to palpation at L4 to S1 and across bilaterally from this region.    LUMBAR ROM:   Full active lumbar flexion and extension limited to 0 degrees.  LOWER EXTREMITY ROM:     WFL.  LOWER EXTREMITY MMT:    Bilateral hip flexion decreased to 4/5 which may be related to pain.    LUMBAR SPECIAL TESTS:  Negative bilateral Slump test.    GAIT: The patient walks in a flexed trunk posture using a FWW with a decrease in step and stride length.    TREATMENT DATE:   09/25/23:  Nustep level 3 x 20 minutes f/b patient in supine 1-1 stretching into bilateral SKTC (2 mins each side), DKTC(2 mins each side) and bilateral rotation(2 mins each side) (10 minutes total) f/b IFC at 80-150 Hz on 40% scan x 20 minutes.  Normal modality response following removal of modality.    09/22/23:  Nustep level 3 x 15 minutes f/b patient in supine 1-1 stretching into bilateral SKTC (2 mins each side), DKTC(2 mins each side) and bilateral rotation(2 mins each side) (10 minutes  total) f/b IFC at 80-150 Hz on 40% scan x 20 minutes.  Normal modality response following removal of modality.  09/19/23:  Nustep level 3 x 15 minutes f/b patient in supine 1-1 stretching into bilateral SKTC, DKTC and bilateral rotation (8 minutes total) f/b IFC at 80-150 Hz on 100% scan x 20 minutes.  Normal modality response following removal of modality  PATIENT EDUCATION:  Education details:  Person educated:  International aid/development worker:  Education comprehension:   HOME EXERCISE PROGRAM: [email or web address]  Access Code: [email or web address]  ASSESSMENT:  CLINICAL IMPRESSION: Patient did well with last treatment and states she was able to stand longer while cooking and is using her walker less at home.  She felt good after today's treatment.    OBJECTIVE IMPAIRMENTS: Abnormal gait, decreased activity tolerance, increased muscle spasms, postural dysfunction, and pain.   ACTIVITY LIMITATIONS: carrying, lifting, bending, standing, and locomotion level  PARTICIPATION LIMITATIONS: meal prep, cleaning, laundry, occupation, and yard work  PERSONAL FACTORS: Time since  onset of injury/illness/exacerbation are also affecting patient's functional outcome.   REHAB POTENTIAL: Good  CLINICAL DECISION MAKING: Stable/uncomplicated  EVALUATION COMPLEXITY: Low   GOALS:  SHORT TERM GOALS: Target date: 09/13/23  Ind with an HEP. Baseline: Goal status: MET  LONG TERM GOALS: Target date: 10/11/23  Perform ADL's with pain not > 3/10.  Goal status: IN PROGRESS  2.  Walk a community distance with her FWW with pain not > 3/10.  Goal status: IN PROGRESS  3.  Perform yardwork with pain not > 3/10.  Goal status: IN PROGRESS   PLAN:  PT FREQUENCY: 2x/week  PT DURATION: 6 weeks  PLANNED INTERVENTIONS: 97110-Therapeutic exercises, 97530- Therapeutic activity, [id number]- Neuromuscular re-education, 97535- Self Care, 02859- Manual therapy, G0283- Electrical stimulation (unattended), 97035-  Ultrasound, 79439 (1-2 muscles), 20561 (3+ muscles)- Dry Needling, Patient/Family education, and Moist heat.  PLAN FOR NEXT SESSION: Nustep, draw-in progression.  Modalities and STW as needed.    Hall Birchard, [place], PT 09/25/2023, 11:02 AM

## 2023-09-29 ENCOUNTER — Ambulatory Visit

## 2023-09-29 DIAGNOSIS — M5459 Other low back pain: Secondary | ICD-10-CM | POA: Diagnosis not present

## 2023-09-29 DIAGNOSIS — M62830 Muscle spasm of back: Secondary | ICD-10-CM

## 2023-09-29 NOTE — Therapy (Signed)
 OUTPATIENT PHYSICAL THERAPY THORACOLUMBAR TREATMENT   Patient Name: Ashley [name] MRN: [id number] DOB:[birth year], 73 y.o., female Today's Date: 09/29/2023  END OF SESSION:  PT End of Session - 09/29/23 1023     Visit Number 9    Number of Visits 12    Date for PT Re-Evaluation 10/11/23    PT Start Time 1015    PT Stop Time 1113    PT Time Calculation (min) 58 min    Activity Tolerance Patient tolerated treatment well    Behavior During Therapy [facility] for tasks assessed/performed           Past Medical History:  Diagnosis Date   A-fib (HCC)    Arthritis    Depression    Diabetes mellitus without complication (HCC)    Dyspnea    Headache    Heart disease    Hyperlipidemia    Hypertension    Hypertension    Past Surgical History:  Procedure Laterality Date   BIOPSY  02/03/2023   Procedure: BIOPSY;  Surgeon: Cinderella Deatrice FALCON, MD;  [facility];  Service: Endoscopy;;   CYST EXCISION Left 03/15/2017   from buttocks   ESOPHAGOGASTRODUODENOSCOPY N/A 07/27/2023   Procedure: EGD (ESOPHAGOGASTRODUODENOSCOPY);  Surgeon: Cinderella Deatrice FALCON, MD;  [facility];  Service: Endoscopy;  Laterality: N/A;  730AM, ASA 3   ESOPHAGOGASTRODUODENOSCOPY (EGD) WITH PROPOFOL  N/A 02/03/2023   Procedure: ESOPHAGOGASTRODUODENOSCOPY (EGD) WITH PROPOFOL ;  Surgeon: Cinderella Deatrice FALCON, MD;  [facility];  Service: Endoscopy;  Laterality: N/A;   ORIF ANKLE FRACTURE Left 04/20/2017   Procedure: OPEN REDUCTION INTERNAL FIXATION (ORIF) LEFT ANKLE FRACTURE;  Surgeon: Kay Kemps, MD;  Location: [facility] OR;  Service: Orthopedics;  Laterality: Left;   SCLEROTHERAPY  02/03/2023   Procedure: SCLEROTHERAPY;  Surgeon: Cinderella Deatrice FALCON, MD;  [facility];  Service: Endoscopy;;   Patient Active Problem List   Diagnosis Date Noted   Diabetic autonomic neuropathy associated with diabetes mellitus due to underlying condition (HCC) 09/15/2023   Iron deficiency anemia due to  chronic blood loss 08/09/2023   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 07/27/2023   Gastritis and gastroduodenitis 07/27/2023   Impaired ambulation 05/02/2023   Decreased mobility and endurance 05/02/2023   DOE (dyspnea on exertion) 04/27/2023   Elevated liver enzymes 04/26/2023   Cirrhosis of liver without ascites (HCC) 04/26/2023   Metabolic dysfunction-associated steatotic liver disease (MASLD) 04/26/2023   Colon cancer screening 04/26/2023   Acute GI bleeding 02/03/2023   Melena 02/03/2023   Peptic ulcer disease with hemorrhage 02/03/2023   GI bleed 02/02/2023   Adnexal cyst 02/02/2023   Aortic valve stenosis, mild 03/22/2022   Encounter for other orthopedic aftercare 05/09/2017   Persistent atrial fibrillation (HCC)    Arthralgia of left ankle 04/05/2017   Severe obesity (BMI >= 40) (HCC) 07/30/2014   Hyperlipidemia 05/31/2010   HTN (hypertension) 05/31/2010   Diabetes mellitus treated with oral medication (HCC) 05/31/2010   Mild peripheral edema 05/31/2010    REFERRING PROVIDER: Mary-Margaret Gladis  REFERRING DIAG: Chronic midline low back pain without Sciatica.    Rationale for Evaluation and Treatment: Rehabilitation  THERAPY DIAG:  Other low back pain  Muscle spasm of back  ONSET DATE: Ongoing.    SUBJECTIVE:  SUBJECTIVE STATEMENT: Pt reports 2-3/10 low back pain.  Pt has recently been diagnosed with neuropathy.   PERTINENT HISTORY:  H/o LBP.  PAIN:  Are you having pain? Yes: NPRS scale: 2-3/10.   Pain location: Lower lumbar region.  Pain description: Ache Aggravating factors: As above. Relieving factors: As above.    PRECAUTIONS: Fall.  Recommend patient use walker at all times.  RED FLAGS: None   WEIGHT BEARING RESTRICTIONS: No  FALLS:  Has  patient fallen in last 6 months? Yes. Number of falls 2.  Now using walker.    LIVING ENVIRONMENT: Lives with: lives alone (Family nearby).   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    PLOF: Independent with basic ADLs and Independent with household mobility with device  PATIENT GOALS:Drive bus again and do yardwork.    OBJECTIVE:  Note: Objective measures were completed at Evaluation unless otherwise noted.  DIAGNOSTIC FINDINGS:   07/18/23:  Most notable is the increased lordosis in the lumbar spine with the facet arthritis starting second lumbar vertebrae and transitioning down through S1 with L4-5 grade 1 spondylolisthesis.  The disc spaces are preserved   Impression spondylosis lumbar spine with spondylolisthesis  PATIENT SURVEYS:  ODI:  25/50.   POSTURE: rounded shoulders, forward head, decreased lumbar lordosis, and flexed trunk   PALPATION: Tender to palpation at L4 to S1 and across bilaterally from this region.    LUMBAR ROM:   Full active lumbar flexion and extension limited to 0 degrees.  LOWER EXTREMITY ROM:     WFL.  LOWER EXTREMITY MMT:    Bilateral hip flexion decreased to 4/5 which may be related to pain.    LUMBAR SPECIAL TESTS:  Negative bilateral Slump test.    GAIT: The patient walks in a flexed trunk posture using a FWW with a decrease in step and stride length.    TREATMENT DATE:   09/29/23:  Nustep level 3 x 20 minutes f/b patient in supine 1-1 stretching into bilateral SKTC (2 mins each side), DKTC(2 mins each side) and bilateral rotation(2 mins each side) (10 minutes total) f/b IFC at 80-150 Hz on 40% scan x 20 minutes.  Normal modality response following removal of modality.    09/22/23:  Nustep level 3 x 15 minutes f/b patient in supine 1-1 stretching into bilateral SKTC (2 mins each side), DKTC(2 mins each side) and bilateral rotation(2 mins  each side) (10 minutes total) f/b IFC at 80-150 Hz on 40% scan x 20 minutes.  Normal modality response following removal of modality.    PATIENT EDUCATION:  Education details:  Person educated:  International aid/development worker:  Education comprehension:   HOME EXERCISE PROGRAM: [email or web address]  Access Code: [email or web address]  ASSESSMENT:  CLINICAL IMPRESSION: Pt arrives for today's treatment session reporting 2-3/10 low back pain.  PT reports that her PCP recently diagnosed her with neuropathy in both of her feet and legs.  STW/M performed to bil lumbar paraspinals with concentration to right side.  Normal responses to estim noted upon removal.  Pt denied any pain at completion of today's treatment session.   OBJECTIVE IMPAIRMENTS: Abnormal gait, decreased activity tolerance, increased muscle spasms, postural dysfunction, and pain.   ACTIVITY LIMITATIONS: carrying, lifting, bending, standing, and locomotion level  PARTICIPATION LIMITATIONS: meal prep, cleaning, laundry, occupation, and yard work  PERSONAL FACTORS: Time since onset of injury/illness/exacerbation are also affecting patient's functional outcome.   REHAB  POTENTIAL: Good  CLINICAL DECISION MAKING: Stable/uncomplicated  EVALUATION COMPLEXITY: Low   GOALS:  SHORT TERM GOALS: Target date: 09/13/23  Ind with an HEP. Baseline: Goal status: MET  LONG TERM GOALS: Target date: 10/11/23  Perform ADL's with pain not > 3/10.  Goal status: IN PROGRESS  2.  Walk a community distance with her FWW with pain not > 3/10.  Goal status: IN PROGRESS  3.  Perform yardwork with pain not > 3/10.  Goal status: IN PROGRESS   PLAN:  PT FREQUENCY: 2x/week  PT DURATION: 6 weeks  PLANNED INTERVENTIONS: 97110-Therapeutic exercises, 97530- Therapeutic activity, [id number]- Neuromuscular re-education, 97535- Self Care, 02859- Manual therapy, G0283- Electrical stimulation (unattended), 97035- Ultrasound, 79439 (1-2 muscles), 20561 (3+  muscles)- Dry Needling, Patient/Family education, and Moist heat.  PLAN FOR NEXT SESSION: Nustep, draw-in progression.  Modalities and STW as needed.    Delon DELENA Gosling, PTA 09/29/2023, 11:14 AM

## 2023-10-02 ENCOUNTER — Other Ambulatory Visit: Payer: Self-pay | Admitting: Nurse Practitioner

## 2023-10-02 ENCOUNTER — Encounter: Admitting: *Deleted

## 2023-10-03 DIAGNOSIS — M7742 Metatarsalgia, left foot: Secondary | ICD-10-CM | POA: Diagnosis not present

## 2023-10-03 DIAGNOSIS — M79672 Pain in left foot: Secondary | ICD-10-CM | POA: Diagnosis not present

## 2023-10-03 DIAGNOSIS — M7741 Metatarsalgia, right foot: Secondary | ICD-10-CM | POA: Diagnosis not present

## 2023-10-03 DIAGNOSIS — M79671 Pain in right foot: Secondary | ICD-10-CM | POA: Diagnosis not present

## 2023-10-06 ENCOUNTER — Ambulatory Visit: Admitting: *Deleted

## 2023-10-06 ENCOUNTER — Encounter: Payer: Self-pay | Admitting: *Deleted

## 2023-10-06 DIAGNOSIS — M62830 Muscle spasm of back: Secondary | ICD-10-CM | POA: Diagnosis not present

## 2023-10-06 DIAGNOSIS — M5459 Other low back pain: Secondary | ICD-10-CM

## 2023-10-06 NOTE — Therapy (Addendum)
 OUTPATIENT PHYSICAL THERAPY THORACOLUMBAR TREATMENT   Patient Name: Ashley [name] MRN: [id number] DOB:August 17, 1950, 73 y.o., female Today's Date: 10/06/2023  END OF SESSION:  PT End of Session - 10/06/23 1014     Visit Number 10    Number of Visits 12    Date for PT Re-Evaluation 10/11/23    PT Start Time 1014           Past Medical History:  Diagnosis Date   A-fib [facility])    Arthritis    Depression    Diabetes mellitus without complication (HCC)    Dyspnea    Headache    Heart disease    Hyperlipidemia    Hypertension    Hypertension    Past Surgical History:  Procedure Laterality Date   BIOPSY  02/03/2023   Procedure: BIOPSY;  Surgeon: Cinderella Deatrice FALCON, MD;  [facility];  Service: Endoscopy;;   CYST EXCISION Left 03/15/2017   from buttocks   ESOPHAGOGASTRODUODENOSCOPY N/A 07/27/2023   Procedure: EGD (ESOPHAGOGASTRODUODENOSCOPY);  Surgeon: Cinderella Deatrice FALCON, MD;  [facility];  Service: Endoscopy;  Laterality: N/A;  730AM, ASA 3   ESOPHAGOGASTRODUODENOSCOPY (EGD) WITH PROPOFOL  N/A 02/03/2023   Procedure: ESOPHAGOGASTRODUODENOSCOPY (EGD) WITH PROPOFOL ;  Surgeon: Cinderella Deatrice FALCON, MD;  [facility];  Service: Endoscopy;  Laterality: N/A;   ORIF ANKLE FRACTURE Left 04/20/2017   Procedure: OPEN REDUCTION INTERNAL FIXATION (ORIF) LEFT ANKLE FRACTURE;  Surgeon: Kay Kemps, MD;  Location: [facility] OR;  Service: Orthopedics;  Laterality: Left;   SCLEROTHERAPY  02/03/2023   Procedure: SCLEROTHERAPY;  Surgeon: Cinderella Deatrice FALCON, MD;  [facility];  Service: Endoscopy;;   Patient Active Problem List   Diagnosis Date Noted   Diabetic autonomic neuropathy associated with diabetes mellitus due to underlying condition (HCC) 09/15/2023   Iron deficiency anemia due to chronic blood loss 08/09/2023   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 07/27/2023   Gastritis and gastroduodenitis 07/27/2023   Impaired ambulation  05/02/2023   Decreased mobility and endurance 05/02/2023   DOE (dyspnea on exertion) 04/27/2023   Elevated liver enzymes 04/26/2023   Cirrhosis of liver without ascites (HCC) 04/26/2023   Metabolic dysfunction-associated steatotic liver disease (MASLD) 04/26/2023   Colon cancer screening 04/26/2023   Acute GI bleeding 02/03/2023   Melena 02/03/2023   Peptic ulcer disease with hemorrhage 02/03/2023   GI bleed 02/02/2023   Adnexal cyst 02/02/2023   Aortic valve stenosis, mild 03/22/2022   Encounter for other orthopedic aftercare 05/09/2017   Persistent atrial fibrillation (HCC)    Arthralgia of left ankle 04/05/2017   Severe obesity (BMI >= 40) (HCC) 07/30/2014   Hyperlipidemia 05/31/2010   HTN (hypertension) 05/31/2010   Diabetes mellitus treated with oral medication (HCC) 05/31/2010   Mild peripheral edema 05/31/2010    REFERRING PROVIDER: Mary-Margaret Gladis  REFERRING DIAG: Chronic midline low back pain without Sciatica.    Rationale for Evaluation and Treatment: Rehabilitation  THERAPY DIAG:  Other low back pain  Muscle spasm of back  ONSET DATE: Ongoing.    SUBJECTIVE:  SUBJECTIVE STATEMENT: Pt reports 4-5/10 low back pain from doing things around the house. I can't lift my RT leg very well   PERTINENT HISTORY:  H/o LBP.  PAIN:  Are you having pain? Yes: NPRS scale: 4-5/10.   Pain location: Lower lumbar region.  Pain description: Ache Aggravating factors: As above. Relieving factors: As above.    PRECAUTIONS: Fall.  Recommend patient use walker at all times.  RED FLAGS: None   WEIGHT BEARING RESTRICTIONS: No  FALLS:  Has patient fallen in last 6 months? Yes. Number of falls 2.  Now using walker.    LIVING ENVIRONMENT: Lives with: lives alone (Family nearby).   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    PLOF: Independent with basic ADLs and Independent with household mobility with device  PATIENT GOALS:Drive bus again and do yardwork.    OBJECTIVE:  Note: Objective measures were completed at Evaluation unless otherwise noted.  DIAGNOSTIC FINDINGS:   07/18/23:  Most notable is the increased lordosis in the lumbar spine with the facet arthritis starting second lumbar vertebrae and transitioning down through S1 with L4-5 grade 1 spondylolisthesis.  The disc spaces are preserved   Impression spondylosis lumbar spine with spondylolisthesis  PATIENT SURVEYS:  ODI:  25/50.   POSTURE: rounded shoulders, forward head, decreased lumbar lordosis, and flexed trunk   PALPATION: Tender to palpation at L4 to S1 and across bilaterally from this region.    LUMBAR ROM:   Full active lumbar flexion and extension limited to 0 degrees.  LOWER EXTREMITY ROM:     WFL.  LOWER EXTREMITY MMT:    Bilateral hip flexion decreased to 4/5 which may be related to pain.    LUMBAR SPECIAL TESTS:  Negative bilateral Slump test.    GAIT: The patient walks in a flexed trunk posture using a FWW with a decrease in step and stride length.    TREATMENT DATE:   10/06/23:   Nustep level 3 x 20 minutes  Seated add ball squeeze x 15 hold 5 secs Clamshell red x 20 Marching with assist on RT side 3x10   IFC at 80-150 Hz on 40% scan x 20 minutes to LB paras seated .  Normal modality response following removal of modality.    09/22/23:  Nustep level 3 x 15 minutes f/b patient in supine 1-1 stretching into bilateral SKTC (2 mins each side), DKTC(2 mins each side) and bilateral rotation(2 mins each side) (10 minutes total) f/b IFC at 80-150 Hz on 40% scan x 20 minutes.  Normal modality response following removal of modality.    PATIENT EDUCATION:  Education details:  Person  educated:  International aid/development worker:  Education comprehension:   HOME EXERCISE PROGRAM: [email or web address]  Access Code: [email or web address]  ASSESSMENT:  CLINICAL IMPRESSION: Pt arrives for today's treatment session reporting 3-4/10 low back pain from doing things at home.She was able to perform Nustep f/b seated LE strengthening exs and did well. IFC end of session seated. STG #1 met, but others are ongoing  OBJECTIVE IMPAIRMENTS: Abnormal gait, decreased activity tolerance, increased muscle spasms, postural dysfunction, and pain.   ACTIVITY LIMITATIONS: carrying, lifting, bending, standing, and locomotion level  PARTICIPATION LIMITATIONS: meal prep, cleaning, laundry, occupation, and yard work  PERSONAL FACTORS: Time since onset of injury/illness/exacerbation are also affecting patient's functional outcome.   REHAB POTENTIAL: Good  CLINICAL DECISION MAKING: Stable/uncomplicated  EVALUATION COMPLEXITY: Low  GOALS:  SHORT TERM GOALS: Target date: 09/13/23  Ind with an HEP. Baseline: Goal status: MET  LONG TERM GOALS: Target date: 10/11/23  Perform ADL's with pain not > 3/10.  Goal status: IN PROGRESS  2.  Walk a community distance with her FWW with pain not > 3/10.  Goal status: IN PROGRESS  3.  Perform yardwork with pain not > 3/10.  Goal status: IN PROGRESS   PLAN:  PT FREQUENCY: 2x/week  PT DURATION: 6 weeks  PLANNED INTERVENTIONS: 97110-Therapeutic exercises, 97530- Therapeutic activity, [id number]- Neuromuscular re-education, 97535- Self Care, 02859- Manual therapy, G0283- Electrical stimulation (unattended), 97035- Ultrasound, 79439 (1-2 muscles), 20561 (3+ muscles)- Dry Needling, Patient/Family education, and Moist heat.  PLAN FOR NEXT SESSION: Nustep, draw-in progression.  Modalities and STW as needed.    Cicilia Clinger,CHRIS, PTA 10/06/2023, 10:15 AM   Progress Note Reporting Period 08/30/23 to 10/06/23.  See note below for Objective Data and Assessment of  Progress/Goals. STG met.  Pain significantly lower today since initial evaluation.  Goals ongoing.    Chad Applegate MPT

## 2023-10-11 ENCOUNTER — Other Ambulatory Visit: Payer: Self-pay | Admitting: Nurse Practitioner

## 2023-10-11 DIAGNOSIS — F411 Generalized anxiety disorder: Secondary | ICD-10-CM

## 2023-10-13 ENCOUNTER — Encounter: Admitting: *Deleted

## 2023-10-19 ENCOUNTER — Other Ambulatory Visit: Payer: Self-pay | Admitting: Nurse Practitioner

## 2023-10-19 DIAGNOSIS — I4819 Other persistent atrial fibrillation: Secondary | ICD-10-CM

## 2023-10-19 DIAGNOSIS — E1159 Type 2 diabetes mellitus with other circulatory complications: Secondary | ICD-10-CM

## 2023-10-23 ENCOUNTER — Other Ambulatory Visit: Payer: Self-pay | Admitting: Nurse Practitioner

## 2023-10-26 DIAGNOSIS — M7742 Metatarsalgia, left foot: Secondary | ICD-10-CM | POA: Diagnosis not present

## 2023-10-26 DIAGNOSIS — M79671 Pain in right foot: Secondary | ICD-10-CM | POA: Diagnosis not present

## 2023-10-26 DIAGNOSIS — M7741 Metatarsalgia, right foot: Secondary | ICD-10-CM | POA: Diagnosis not present

## 2023-10-26 DIAGNOSIS — M79672 Pain in left foot: Secondary | ICD-10-CM | POA: Diagnosis not present

## 2023-10-31 ENCOUNTER — Encounter: Payer: Self-pay | Admitting: Nurse Practitioner

## 2023-10-31 ENCOUNTER — Other Ambulatory Visit: Payer: Self-pay | Admitting: Nurse Practitioner

## 2023-10-31 ENCOUNTER — Ambulatory Visit: Payer: Medicare HMO | Admitting: Nurse Practitioner

## 2023-10-31 VITALS — BP 151/69 | HR 60 | Temp 98.5°F | Ht 62.0 in | Wt 262.0 lb

## 2023-10-31 DIAGNOSIS — R600 Localized edema: Secondary | ICD-10-CM | POA: Diagnosis not present

## 2023-10-31 DIAGNOSIS — D500 Iron deficiency anemia secondary to blood loss (chronic): Secondary | ICD-10-CM | POA: Diagnosis not present

## 2023-10-31 DIAGNOSIS — Z0001 Encounter for general adult medical examination with abnormal findings: Secondary | ICD-10-CM | POA: Diagnosis not present

## 2023-10-31 DIAGNOSIS — E782 Mixed hyperlipidemia: Secondary | ICD-10-CM | POA: Diagnosis not present

## 2023-10-31 DIAGNOSIS — I4819 Other persistent atrial fibrillation: Secondary | ICD-10-CM | POA: Diagnosis not present

## 2023-10-31 DIAGNOSIS — Z7984 Long term (current) use of oral hypoglycemic drugs: Secondary | ICD-10-CM | POA: Diagnosis not present

## 2023-10-31 DIAGNOSIS — E119 Type 2 diabetes mellitus without complications: Secondary | ICD-10-CM

## 2023-10-31 DIAGNOSIS — F419 Anxiety disorder, unspecified: Secondary | ICD-10-CM

## 2023-10-31 DIAGNOSIS — I10 Essential (primary) hypertension: Secondary | ICD-10-CM | POA: Diagnosis not present

## 2023-10-31 DIAGNOSIS — G8929 Other chronic pain: Secondary | ICD-10-CM

## 2023-10-31 DIAGNOSIS — F411 Generalized anxiety disorder: Secondary | ICD-10-CM

## 2023-10-31 DIAGNOSIS — M545 Low back pain, unspecified: Secondary | ICD-10-CM | POA: Diagnosis not present

## 2023-10-31 DIAGNOSIS — E1159 Type 2 diabetes mellitus with other circulatory complications: Secondary | ICD-10-CM | POA: Diagnosis not present

## 2023-10-31 DIAGNOSIS — I152 Hypertension secondary to endocrine disorders: Secondary | ICD-10-CM

## 2023-10-31 DIAGNOSIS — Z0000 Encounter for general adult medical examination without abnormal findings: Secondary | ICD-10-CM

## 2023-10-31 LAB — LIPID PANEL

## 2023-10-31 LAB — BAYER DCA HB A1C WAIVED: HB A1C (BAYER DCA - WAIVED): 8.6 % — ABNORMAL HIGH (ref 4.8–5.6)

## 2023-10-31 MED ORDER — CLONIDINE HCL 0.1 MG PO TABS
0.1000 mg | ORAL_TABLET | Freq: Two times a day (BID) | ORAL | 1 refills | Status: AC
Start: 2023-10-31 — End: ?

## 2023-10-31 MED ORDER — METOPROLOL TARTRATE 50 MG PO TABS: 50.0000 mg | ORAL_TABLET | Freq: Two times a day (BID) | ORAL | 1 refills | Status: DC

## 2023-10-31 MED ORDER — ALPRAZOLAM 0.25 MG PO TABS: 0.2500 mg | ORAL_TABLET | Freq: Every day | ORAL | 5 refills | Status: DC

## 2023-10-31 MED ORDER — TOLTERODINE TARTRATE ER 2 MG PO CP24: 2.0000 mg | ORAL_CAPSULE | Freq: Every day | ORAL | 1 refills | Status: DC

## 2023-10-31 MED ORDER — SIMVASTATIN 40 MG PO TABS: 40.0000 mg | ORAL_TABLET | Freq: Every evening | ORAL | 1 refills | Status: DC

## 2023-10-31 MED ORDER — APIXABAN 5 MG PO TABS: 5.0000 mg | ORAL_TABLET | Freq: Two times a day (BID) | ORAL | 1 refills | Status: DC

## 2023-10-31 MED ORDER — PANTOPRAZOLE SODIUM 40 MG PO TBEC: 40.0000 mg | DELAYED_RELEASE_TABLET | Freq: Two times a day (BID) | ORAL | 1 refills | Status: DC

## 2023-10-31 MED ORDER — AMLODIPINE BESYLATE 5 MG PO TABS: 5.0000 mg | ORAL_TABLET | Freq: Every day | ORAL | 1 refills | Status: DC

## 2023-10-31 MED ORDER — GLIPIZIDE 5 MG PO TABS: 5.0000 mg | ORAL_TABLET | Freq: Two times a day (BID) | ORAL | 1 refills | Status: DC

## 2023-10-31 MED ORDER — LISINOPRIL 20 MG PO TABS: 20.0000 mg | ORAL_TABLET | Freq: Every day | ORAL | 1 refills | Status: DC

## 2023-10-31 MED ORDER — METFORMIN HCL ER (MOD) 1000 MG PO TB24: 1000.0000 mg | ORAL_TABLET | Freq: Every day | ORAL | 1 refills | Status: DC

## 2023-10-31 MED ORDER — METFORMIN HCL ER 500 MG PO TB24: 1000.0000 mg | ORAL_TABLET | Freq: Every day | ORAL | 1 refills | Status: DC

## 2023-10-31 MED ORDER — METFORMIN HCL ER 500 MG PO TB24
500.0000 mg | ORAL_TABLET | Freq: Every day | ORAL | 1 refills | Status: DC
Start: 2023-10-31 — End: 2023-10-31

## 2023-10-31 MED ORDER — FUROSEMIDE 20 MG PO TABS: 20.0000 mg | ORAL_TABLET | Freq: Every day | ORAL | 1 refills | Status: DC

## 2023-10-31 NOTE — Progress Notes (Signed)
 Subjective:    Patient ID: Ashley [name], female    DOB: Sep 29, 1950, 73 y.o.   MRN: [id number]   Chief Complaint: annual physical     HPI:  Ashley [name] is a 73 y.o. who identifies as a female who was assigned female at birth.   Social history: Lives with: her granddaughter lives with her Work history: drives a school bus   Comes in today for follow up of the following chronic medical issues:  1. Primary hypertension No c/o chest pain, sob or headache. Does not check blood pressure at home. BP Readings from Last 3 Encounters:  09/15/23 (!) 138/55  08/22/23 133/83  08/09/23 (!) 143/79     2. Mixed hyperlipidemia Does not really watch diet and does no dedicated exercise. Lab Results  Component Value Date   CHOL 143 04/03/2023   HDL 56 04/03/2023   LDLCALC 71 04/03/2023   TRIG 84 04/03/2023   CHOLHDL 2.6 04/03/2023     3. Diabetes mellitus treated with oral medication (HCC) Doe snot check blood sugras at home. Nor does she watch her diet. Lab Results  Component Value Date   HGBA1C 6.7 (H) 04/03/2023     4. Persistent atrial fibrillation (HCC) Is on eliquis  daily- no bleeding issues  5. Peripheral edema Has edema at the end of each day- will resolve at night  6. Iron deficiency anemia Lab Results  Component Value Date   HGB 13.8 08/22/2023   7. Anxiety Has seem to have gotten worse through the years. Takes xanax  every day.    10/31/2023   11:15 AM 08/22/2023   11:53 AM 04/03/2023   11:10 AM 12/16/2022    2:43 PM  GAD 7 : Generalized Anxiety Score  Nervous, Anxious, on Edge 3 3 2  0  Control/stop worrying 3 3 2  0  Worry too much - different things 3 2 2  0  Trouble relaxing 3 3 0 0  Restless 1 0 0 0  Easily annoyed or irritable 2 0 0 0  Afraid - awful might happen 3 3 2  0  Total GAD 7 Score 18 14 8  0  Anxiety Difficulty Somewhat difficult Somewhat difficult Somewhat difficult Not difficult at all      8. Severe obesity (BMI >= 40)  (HCC) No recent weight changes Wt Readings from Last 3 Encounters:  09/15/23 267 lb (121.1 kg)  08/22/23 252 lb (114.3 kg)  08/09/23 254 lb (115.2 kg)   BMI Readings from Last 3 Encounters:  09/15/23 48.83 kg/m  08/22/23 46.09 kg/m  08/09/23 45.72 kg/m      New complaints: Back pain- has been going on for over 6 months. Pain stays in lower back. Describes pain as achy. Rates pain 6/10. Wants to see specialist  Allergies  Allergen Reactions   Iodine Dermatitis   Cipro  [Ciprofloxacin  Hcl] Hives   Outpatient Encounter Medications as of 10/31/2023  Medication Sig   ALPRAZolam  (XANAX ) 0.25 MG tablet Take 1 tablet (0.25 mg total) by mouth at bedtime.   amLODipine  (NORVASC ) 5 MG tablet TAKE 1 TABLET EVERY DAY   ascorbic acid (VITAMIN C) 500 MG tablet Take 500 mg by mouth at bedtime.   Cholecalciferol (VITAMIN D-3 PO) Take 1 capsule by mouth at bedtime.   CINNAMON PO Take 1 capsule by mouth daily.   cloNIDine  (CATAPRES ) 0.1 MG tablet TAKE 1 TABLET TWICE DAILY   ELIQUIS  5 MG TABS tablet TAKE 1 TABLET TWICE DAILY   ferrous sulfate 325 (65 FE) MG  tablet Take 325 mg by mouth daily with breakfast.   furosemide  (LASIX ) 20 MG tablet TAKE 1 TABLET EVERY DAY   gabapentin  (NEURONTIN ) 100 MG capsule Take 1 capsule (100 mg total) by mouth 3 (three) times daily.   glipiZIDE  (GLUCOTROL ) 5 MG tablet Take 5 mg by mouth 2 (two) times daily before a meal.   lisinopril  (ZESTRIL ) 20 MG tablet Take 20 mg by mouth daily.   metFORMIN  (GLUCOPHAGE -XR) 500 MG 24 hr tablet Take 500 mg by mouth daily with breakfast.   metoprolol  tartrate (LOPRESSOR ) 50 MG tablet TAKE 1 TABLET TWICE DAILY   nystatin  (MYCOSTATIN /NYSTOP ) powder Apply 1 Application topically 3 (three) times daily.   Omega-3 Fatty Acids (FISH OIL PO) Take 1 capsule by mouth daily.   pantoprazole  (PROTONIX ) 40 MG tablet Take 1 tablet (40 mg total) by mouth 2 (two) times daily before a meal.   polyethylene glycol (MIRALAX  / GLYCOLAX ) 17 g packet  Take 17 g by mouth daily as needed for mild constipation or moderate constipation.   simvastatin  (ZOCOR ) 40 MG tablet TAKE 1 TABLET EVERY EVENING   sucralfate  (CARAFATE ) 1 g tablet Take 1 tablet (1 g total) by mouth 4 (four) times daily -  with meals and at bedtime.   tolterodine  (DETROL  LA) 2 MG 24 hr capsule TAKE 1 CAPSULE BY MOUTH EVERY DAY   Turmeric (QC TUMERIC COMPLEX PO) Take 1 capsule by mouth daily.   zinc gluconate 50 MG tablet Take 50 mg by mouth at bedtime.   No facility-administered encounter medications on file as of 10/31/2023.    Past Surgical History:  Procedure Laterality Date   BIOPSY  02/03/2023   Procedure: BIOPSY;  Surgeon: Cinderella Deatrice FALCON, MD;  Location: [facility];  Service: Endoscopy;;   CYST EXCISION Left 03/15/2017   from buttocks   ESOPHAGOGASTRODUODENOSCOPY N/A 07/27/2023   Procedure: EGD (ESOPHAGOGASTRODUODENOSCOPY);  Surgeon: Cinderella Deatrice FALCON, MD;  Location: [facility];  Service: Endoscopy;  Laterality: N/A;  730AM, ASA 3   ESOPHAGOGASTRODUODENOSCOPY (EGD) WITH PROPOFOL  N/A 02/03/2023   Procedure: ESOPHAGOGASTRODUODENOSCOPY (EGD) WITH PROPOFOL ;  Surgeon: Cinderella Deatrice FALCON, MD;  Location: [facility];  Service: Endoscopy;  Laterality: N/A;   ORIF ANKLE FRACTURE Left 04/20/2017   Procedure: OPEN REDUCTION INTERNAL FIXATION (ORIF) LEFT ANKLE FRACTURE;  Surgeon: Kay Kemps, MD;  Location: [facility];  Service: Orthopedics;  Laterality: Left;   SCLEROTHERAPY  02/03/2023   Procedure: SCLEROTHERAPY;  Surgeon: Cinderella Deatrice FALCON, MD;  Location: [facility];  Service: Endoscopy;;    Family History  Problem Relation Age of Onset   Diabetes Mother    CAD Mother        CABG at age 48   Hypertension Mother    Hyperlipidemia Mother    Diabetes Father    Heart disease Father        CHF   Hypertension Father    Diabetes Sister    CAD Sister    Hypertension Sister    Diabetes Brother    Hypertension Brother    Healthy Sister       Controlled  substance contract: n/a     Review of Systems  Constitutional:  Negative for diaphoresis.  Eyes:  Negative for pain.  Respiratory:  Negative for shortness of breath.   Cardiovascular:  Negative for chest pain, palpitations and leg swelling.  Gastrointestinal:  Negative for abdominal pain.  Endocrine: Negative for polydipsia.  Skin:  Negative for rash.  Neurological:  Negative for dizziness, weakness and  headaches.  Hematological:  Does not bruise/bleed easily.  All other systems reviewed and are negative.      Objective:   Physical Exam Vitals and nursing note reviewed.  Constitutional:      General: She is not in acute distress.    Appearance: Normal appearance. She is well-developed.  HENT:     Head: Normocephalic.     Right Ear: Tympanic membrane normal.     Left Ear: Tympanic membrane normal.     Nose: Nose normal.     Mouth/Throat:     Mouth: Mucous membranes are moist.  Eyes:     Pupils: Pupils are equal, round, and reactive to light.  Neck:     Vascular: No carotid bruit or JVD.  Cardiovascular:     Rate and Rhythm: Normal rate and regular rhythm.     Heart sounds: Normal heart sounds.  Pulmonary:     Effort: Pulmonary effort is normal. No respiratory distress.     Breath sounds: Normal breath sounds. No wheezing or rales.  Chest:     Chest wall: No tenderness.  Abdominal:     General: Bowel sounds are normal. There is no distension or abdominal bruit.     Palpations: Abdomen is soft. There is no hepatomegaly, splenomegaly, mass or pulsatile mass.     Tenderness: There is no abdominal tenderness.  Musculoskeletal:        General: Normal range of motion.     Cervical back: Normal range of motion and neck supple.     Right lower leg: Edema (1+) present.     Left lower leg: Edema (1+) present.     Comments: Rises slowly from sitting to standing Pain with weight bearing (-) SLR bil  Lymphadenopathy:     Cervical: No cervical adenopathy.  Skin:    General:  Skin is warm and dry.  Neurological:     Mental Status: She is alert and oriented to person, place, and time.     Deep Tendon Reflexes: Reflexes are normal and symmetric.  Psychiatric:        Behavior: Behavior normal.        Thought Content: Thought content normal.        Judgment: Judgment normal.    BP (!) 151/69   Pulse 60   Temp 98.5 F (36.9 C) (Temporal)   Ht 5' 2 (1.575 m)   Wt 262 lb (118.8 kg)   SpO2 96%   BMI 47.92 kg/m     HGBa1c 8.6%     Assessment & Plan:  Ashley [name] comes in today with chief complaint of Medical Management of Chronic Issues   Diagnosis and orders addressed:  1. Annual physical exam (Primary)  2. Hypertension associated with diabetes (HCC) Low sodium diet - CBC with Differential/Platelet - CMP14+EGFR - cloNIDine  (CATAPRES ) 0.1 MG tablet; Take 1 tablet (0.1 mg total) by mouth 2 (two) times daily.  Dispense: 180 tablet; Refill: 1 - amLODipine  (NORVASC ) 5 MG tablet; Take 1 tablet (5 mg total) by mouth daily.  Dispense: 90 tablet; Refill: 1 - lisinopril  (ZESTRIL ) 20 MG tablet; Take 1 tablet (20 mg total) by mouth daily.  Dispense: 90 tablet; Refill: 1  3. Mixed hyperlipidemia Low fat diet - Lipid panel - simvastatin  (ZOCOR ) 40 MG tablet; Take 1 tablet (40 mg total) by mouth every evening.  Dispense: 90 tablet; Refill: 1  4. Diabetes mellitus treated with oral medication (HCC) Stricter carb counting Increase metformin  to 1000mg  XR - Bayer DCA  Hb A1c Waived - Vitamin B12 - Microalbumin / creatinine urine ratio - glipiZIDE  (GLUCOTROL ) 5 MG tablet; Take 1 tablet (5 mg total) by mouth 2 (two) times daily before a meal.  Dispense: 90 tablet; Refill: 1 - metFORMIN  (GLUMETZA ) 1000 MG (MOD) 24 hr tablet; Take 1 tablet (1,000 mg total) by mouth daily with breakfast.  Dispense: 90 tablet; Refill: 1  5. Persistent atrial fibrillation (HCC) Report any bleeding issues - apixaban  (ELIQUIS ) 5 MG TABS tablet; Take 1 tablet (5 mg total) by  mouth 2 (two) times daily.  Dispense: 180 tablet; Refill: 1 - metoprolol  tartrate (LOPRESSOR ) 50 MG tablet; Take 1 tablet (50 mg total) by mouth 2 (two) times daily.  Dispense: 180 tablet; Refill: 1  6. Mild peripheral edema Elevate legs when sitting - furosemide  (LASIX ) 20 MG tablet; Take 1 tablet (20 mg total) by mouth daily.  Dispense: 90 tablet; Refill: 1  7. Anxiety Stress management - ToxASSURE Select 13 (MW), Urine  8. Iron deficiency anemia due to chronic blood loss Labs pending  9. GAD (generalized anxiety disorder) - ALPRAZolam  (XANAX ) 0.25 MG tablet; Take 1 tablet (0.25 mg total) by mouth at bedtime.  Dispense: 30 tablet; Refill: 5  10. Severe obesity (BMI >= 40) (HCC) Discussed diet and exercise for person with BMI >25 Will recheck weight in 3-6 months  11. Chronic back pain Referral to ortho  Labs pending Health Maintenance reviewed Diet and exercise encouraged  Follow up plan: 3 months   Ashley Gladis, FNP

## 2023-10-31 NOTE — Patient Instructions (Signed)
 Chronic Back Pain Chronic back pain is back pain that lasts longer than 3 months. The pain may get worse at certain times (flare-ups). There are things you can do at home to manage your pain. Follow these instructions at home: Watch for any changes in your symptoms. Take these actions to help with your pain: Managing pain and stiffness     If told, put ice on the painful area. You may be told to use ice for 24-48 hours after a flare-up starts. Put ice in a plastic bag. Place a towel between your skin and the bag. Leave the ice on for 20 minutes, 2-3 times a day. If told, put heat on the painful area. Do this as often as told by your doctor. Use the heat source that your doctor recommends, such as a moist heat pack or a heating pad. Place a towel between your skin and the heat source. Leave the heat on for 20-30 minutes. If your skin turns bright red, take off the ice or heat right away to prevent skin damage. The risk of damage is higher if you cannot feel pain, heat, or cold. Soak in a warm bath. This can help with pain. Activity        Avoid bending and other activities that make the pain worse. When you stand: Keep your upper back and neck straight. Keep your shoulders pulled back. Avoid slouching. When you sit: Keep your back straight. Relax your shoulders. Do not round your shoulders or pull them backward. Do not sit or stand in one place for too long. Take short rest breaks during the day. Lying down or standing is often better than sitting. Resting can help relieve pain. When sitting or lying down for a long time, do some mild activity or stretching. This will help to prevent stiffness and pain. Get regular exercise. Ask your doctor what activities are safe for you. You may have to avoid lifting. Ask your provider how much you can safely lift. If you lift things: Bend your knees. Keep the weight close to your body. Avoid twisting. Medicines Take over-the-counter and  prescription medicines only as told by your doctor. You may need to take medicines for pain and swelling. These may be taken by mouth or put on the skin. You may also be given muscle relaxants. Ask your doctor if the medicine prescribed to you: Requires you to avoid driving or using machinery. Can cause trouble pooping (constipation). You may need to take these actions to prevent or treat trouble pooping: Drink enough fluid to keep your pee (urine) pale yellow. Take over-the-counter or prescription medicines. Eat foods that are high in fiber. These include beans, whole grains, and fresh fruits and vegetables. Limit foods that are high in fat and sugars. These include fried or sweet foods. General instructions  Sleep on a firm mattress. Try lying on your side with your knees slightly bent. If you lie on your back, put a pillow under your knees. Do not smoke or use any products that contain nicotine or tobacco. If you need help quitting, ask your doctor. Contact a doctor if: Your pain does not get better with rest or medicine. You have new pain. You have a fever. You lose weight quickly. You have trouble doing your normal activities. One or both of your legs or feet feel weak. One or both of your legs or feet lose feeling (have numbness). Get help right away if: You are not able to control when you pee  or poop. You have bad back pain and: You feel like you may vomit (nauseous). You vomit. You have pain in your chest or your belly (abdomen). You have shortness of breath. You faint. These symptoms may be an emergency. Get help right away. Call 911. Do not wait to see if the symptoms will go away. Do not drive yourself to the hospital. This information is not intended to replace advice given to you by your health care provider. Make sure you discuss any questions you have with your health care provider. Document Revised: 09/13/2021 Document Reviewed: 09/13/2021 Elsevier Patient Education   2024 [employer].

## 2023-10-31 NOTE — Telephone Encounter (Signed)
  metFORMIN  (GLUCOPHAGE ) 500 MG tablet        Changed from: metFORMIN  (GLUMETZA ) 1000 MG (MOD) 24 hr tablet   Pharmacy comment: Alternative Requested:THE PRESCRIBED MEDICATION IS NOT COVERED BY INSURANCE. PLEASE CONSIDER CHANGING TO ONE OF THE SUGGESTED COVERED ALTERNATIVES.   All Pharmacy Suggested Alternatives:  metFORMIN  (GLUCOPHAGE ) 500 MG tablet metFORMIN  (GLUCOPHAGE -XR) 750 MG 24 hr tablet metFORMIN  (GLUCOPHAGE -XR) 500 MG 24 hr tablet

## 2023-11-01 LAB — LIPID PANEL
Cholesterol, Total: 144 mg/dL (ref 100–199)
HDL: 44 mg/dL (ref >39)
LDL CALC COMMENT:: 3.3 ratio (ref 0.0–4.4)
LDL Chol Calc (NIH): 71 mg/dL (ref 0–99)
Triglycerides: 170 mg/dL — AB (ref 0–149)
VLDL Cholesterol Cal: 29 mg/dL (ref 5–40)

## 2023-11-01 LAB — CBC WITH DIFFERENTIAL/PLATELET
Basophils Absolute: 0 x10E3/uL (ref 0.0–0.2)
Basos: 1 %
EOS (ABSOLUTE): 0.1 x10E3/uL (ref 0.0–0.4)
Eos: 1 %
Hematocrit: 40.7 % (ref 34.0–46.6)
Hemoglobin: 13.2 g/dL (ref 11.1–15.9)
Immature Grans (Abs): 0.1 x10E3/uL (ref 0.0–0.1)
Immature Granulocytes: 1 %
Lymphocytes Absolute: 1.5 x10E3/uL (ref 0.7–3.1)
Lymphs: 19 %
MCH: 31.6 pg (ref 26.6–33.0)
MCHC: 32.4 g/dL (ref 31.5–35.7)
MCV: 97 fL (ref 79–97)
Monocytes Absolute: 0.6 x10E3/uL (ref 0.1–0.9)
Monocytes: 8 %
Neutrophils Absolute: 5.5 x10E3/uL (ref 1.4–7.0)
Neutrophils: 70 %
Platelets: 235 x10E3/uL (ref 150–450)
RBC: 4.18 x10E6/uL (ref 3.77–5.28)
RDW: 13.6 % (ref 11.7–15.4)
WBC: 7.8 x10E3/uL (ref 3.4–10.8)

## 2023-11-01 LAB — CMP14+EGFR
ALT: 20 IU/L (ref 0–32)
AST: 23 IU/L (ref 0–40)
Albumin: 3.8 g/dL (ref 3.8–4.8)
Alkaline Phosphatase: 74 IU/L (ref 49–135)
BUN/Creatinine Ratio: 23 (ref 12–28)
BUN: 16 mg/dL (ref 8–27)
Bilirubin Total: 0.7 mg/dL (ref 0.0–1.2)
CO2: 21 mmol/L (ref 20–29)
Calcium: 9.4 mg/dL (ref 8.7–10.3)
Chloride: 99 mmol/L (ref 96–106)
Creatinine, Ser: 0.7 mg/dL (ref 0.57–1.00)
Globulin, Total: 2.8 g/dL (ref 1.5–4.5)
Glucose: 287 mg/dL — AB (ref 70–99)
Potassium: 4.3 mmol/L (ref 3.5–5.2)
Sodium: 137 mmol/L (ref 134–144)
Total Protein: 6.6 g/dL (ref 6.0–8.5)
eGFR: 92 mL/min/1.73 (ref >59)

## 2023-11-01 LAB — MICROALBUMIN / CREATININE URINE RATIO
Creatinine, Urine: 181.6 mg/dL
Microalb/Creat Ratio: 9 mg/g{creat} (ref 0–29)
Microalbumin, Urine: 16.3 ug/mL

## 2023-11-01 LAB — VITAMIN B12: Vitamin B-12: 507 pg/mL (ref 232–1245)

## 2023-11-02 LAB — TOXASSURE SELECT 13 (MW), URINE

## 2023-11-06 ENCOUNTER — Other Ambulatory Visit: Payer: Self-pay | Admitting: Nurse Practitioner

## 2023-11-06 NOTE — Telephone Encounter (Signed)
 Message on prescription metformin  500mg  XR - she is to be on 2 daily  Mary-Margaret Gladis, FNP

## 2023-11-06 NOTE — Telephone Encounter (Signed)
 Name from pharmacy: METFORMIN  HYDROCHLORIDE ER 500 MG Oral Tablet Extended Release 24 Hour  Pharmacy comment: CLARIFY CURRENT TX IN NOTE TO [facility]. PT HAS 2 RXS FOR METFORMIN  ER 500MG  TAB (AB1), BOTH DATED 10/31/23. ONE HAS DIRECTIONS 2 QD WITH BREAKFAST AND THE OTHER HAS 1 QD WITH BREAKFAST.

## 2023-11-07 NOTE — Telephone Encounter (Signed)
 Patient did not get lab results.  Hgba1 was discussed at appointment and metformin  dose was raised Cholesterol looks good CBC good Blood sugar elevated- decrease carbs in diet Kidney and liver function stable  Mary-Margaret Gladis, FNP

## 2023-11-09 ENCOUNTER — Ambulatory Visit: Admitting: *Deleted

## 2023-11-09 DIAGNOSIS — Z23 Encounter for immunization: Secondary | ICD-10-CM | POA: Diagnosis not present

## 2023-11-09 NOTE — Progress Notes (Signed)
 Patient is in office today for a nurse visit for [facility] Vaccine. Injection was given in RIGHT DELTOID. Patient tolerated well.

## 2023-12-05 DIAGNOSIS — M7742 Metatarsalgia, left foot: Secondary | ICD-10-CM | POA: Diagnosis not present

## 2023-12-05 DIAGNOSIS — M79672 Pain in left foot: Secondary | ICD-10-CM | POA: Diagnosis not present

## 2023-12-06 DIAGNOSIS — M47816 Spondylosis without myelopathy or radiculopathy, lumbar region: Secondary | ICD-10-CM | POA: Diagnosis not present

## 2023-12-14 ENCOUNTER — Encounter (INDEPENDENT_AMBULATORY_CARE_PROVIDER_SITE_OTHER): Payer: Self-pay | Admitting: Gastroenterology

## 2023-12-31 ENCOUNTER — Other Ambulatory Visit: Payer: Self-pay | Admitting: Nurse Practitioner

## 2024-01-16 ENCOUNTER — Other Ambulatory Visit: Payer: Self-pay | Admitting: Nurse Practitioner

## 2024-01-23 ENCOUNTER — Ambulatory Visit (INDEPENDENT_AMBULATORY_CARE_PROVIDER_SITE_OTHER): Admitting: Gastroenterology

## 2024-01-24 ENCOUNTER — Other Ambulatory Visit

## 2024-01-24 ENCOUNTER — Ambulatory Visit (INDEPENDENT_AMBULATORY_CARE_PROVIDER_SITE_OTHER): Admitting: Gastroenterology

## 2024-01-24 ENCOUNTER — Encounter (INDEPENDENT_AMBULATORY_CARE_PROVIDER_SITE_OTHER): Payer: Self-pay | Admitting: Gastroenterology

## 2024-01-24 ENCOUNTER — Telehealth (INDEPENDENT_AMBULATORY_CARE_PROVIDER_SITE_OTHER): Payer: Self-pay

## 2024-01-24 VITALS — BP 124/62 | HR 64 | Temp 97.2°F | Ht 62.0 in | Wt 268.3 lb

## 2024-01-24 DIAGNOSIS — D500 Iron deficiency anemia secondary to blood loss (chronic): Secondary | ICD-10-CM

## 2024-01-24 DIAGNOSIS — Z8711 Personal history of peptic ulcer disease: Secondary | ICD-10-CM

## 2024-01-24 DIAGNOSIS — K274 Chronic or unspecified peptic ulcer, site unspecified, with hemorrhage: Secondary | ICD-10-CM

## 2024-01-24 DIAGNOSIS — K7460 Unspecified cirrhosis of liver: Secondary | ICD-10-CM | POA: Diagnosis not present

## 2024-01-24 DIAGNOSIS — D509 Iron deficiency anemia, unspecified: Secondary | ICD-10-CM

## 2024-01-24 NOTE — Patient Instructions (Signed)
 It was very nice to meet you today, as dicussed with will plan for the following :  1) labs and ultrasound  2) Protonix  make it once daily

## 2024-01-24 NOTE — Telephone Encounter (Signed)
 Spoke with patients daughter Powell (on dpr) and gave ultrasound appt details for 02/05/2024 at 9:30am.

## 2024-01-24 NOTE — Progress Notes (Signed)
 Ashley [name] Ashley [name] , M.D. Gastroenterology & Hepatology [facility] [address] [place], [place] [address] Primary Care Physician: Gladis Mustard, FNP [address] [place] [address]  Chief Complaint: cirrhosis, iron deficiency and hemorrhagic gastropathy  History of Present Illness: This is a 73 y.o. female with diabetes hypertension A-fib on Eliquis , AV stenosis , history of PUD with GI bleed (01/2023)  Forrest IIC duodenal ulcer is here for follow-up on cirrhosis, iron deficiency and hemorrhagic gastropathy  Currently patient reports she is feeling well.  Stools are brown in color and never black.   Previously she was taking celecoxib  every day that she have stopped and not taking Tylenol  only . The patient denies having any nausea, vomiting, fever, chills, hematochezia, melena, hematemesis, abdominal distention, abdominal pain, diarrhea, jaundice, pruritus or weight loss.  The patient has a sedentary lifestyle and started gaining weight  Patient  had an outpatient surgery with upper endoscopy to evaluate healing of the ulcer but incidentally was found to have bleeding gastropathy either dieulafoy/angioectasia which required endoscopy hemostasis (clipping, bipolar and PURASTAT)   Patient is a lifelong nonalcohol user, denies any herbal medications or family history of liver issues.  Never been told of cirrhosis previously  Last EGD:07/2023  - Normal esophagus. 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 PURASTAT applied. Treated with bipolar cautery. Clip ( MR conditional) was placed. Clip manufacturer: [employer]. Biopsied. Exposed blood vessel seen upon bipolar cautery . Complete Hemostatis achieved at the end of the procedure - Normal duodenal bulb and second portion of the duodenum.  A. ABNORMAL GASTRIC MUCOSA, BIOPSY:  - Gastric antral  mucosa showing marked nonspecific reactive gastropathy  with erosion  - Negative for intestinal metaplasia, dysplasia or malignancy  - Helicobacter pylori-like organisms are not identified on routine HE  stain   01/2023  No gross lesions at the esophagus - Erythematous mucosa in the antrum. Biopsied. - Non- bleeding duodenal ulcer with a flat pigmented spot ( Forrest Class IIc) . Injected.   A. STOMACH, BIOPSY:  Gastric antral and oxyntic mucosa with slight chronic inflammation.  Negative for Helicobacter pylori.   Last Colonoscopy:none  Negative Cologaurd 3 months ago   FHx: neg for any gastrointestinal/liver disease, no malignancies Social: neg smoking, alcohol or illicit drug use Surgical: no abdominal surgeries  Past Medical History: Past Medical History:  Diagnosis Date   A-fib (HCC)    Arthritis    Depression    Diabetes mellitus without complication (HCC)    Dyspnea    Headache    Heart disease    Hyperlipidemia    Hypertension    Hypertension     Past Surgical History: Past Surgical History:  Procedure Laterality Date   BIOPSY  02/03/2023   Procedure: BIOPSY;  Surgeon: Cinderella Deatrice FALCON, MD;  [facility];  Service: Endoscopy;;   CYST EXCISION Left 03/15/2017   from buttocks   ESOPHAGOGASTRODUODENOSCOPY N/A 07/27/2023   Procedure: EGD (ESOPHAGOGASTRODUODENOSCOPY);  Surgeon: Cinderella Deatrice FALCON, MD;  [facility];  Service: Endoscopy;  Laterality: N/A;  730AM, ASA 3   ESOPHAGOGASTRODUODENOSCOPY (EGD) WITH PROPOFOL  N/A 02/03/2023   Procedure: ESOPHAGOGASTRODUODENOSCOPY (EGD) WITH PROPOFOL ;  Surgeon: Cinderella Deatrice FALCON, MD;  [facility];  Service: Endoscopy;  Laterality: N/A;   ORIF ANKLE FRACTURE Left 04/20/2017   Procedure: OPEN REDUCTION INTERNAL FIXATION (ORIF) LEFT ANKLE FRACTURE;  Surgeon: Kay Kemps, MD;  Location: [facility];  Service: Orthopedics;  Laterality: Left;   SCLEROTHERAPY  02/03/2023   Procedure: SCLEROTHERAPY;   Surgeon: Cinderella Deatrice FALCON, MD;  [facility];  Service: Endoscopy;;    Family History: Family History  Problem Relation Age of Onset   Diabetes Mother    CAD Mother        CABG at age 61   Hypertension Mother    Hyperlipidemia Mother    Diabetes Father    Heart disease Father        CHF   Hypertension Father    Diabetes Sister    CAD Sister    Hypertension Sister    Diabetes Brother    Hypertension Brother    Healthy Sister     Social History: Social History   Tobacco Use  Smoking Status Never  Smokeless Tobacco Never   Social History   Substance and Sexual Activity  Alcohol Use No   Social History   Substance and Sexual Activity  Drug Use No    Allergies: Allergies  Allergen Reactions   Iodine Dermatitis   Cipro  [Ciprofloxacin  Hcl] Hives    Medications: Current Outpatient Medications  Medication Sig Dispense Refill   Accu-Chek Softclix Lancets lancets TEST BLOOD SUGAR TWO TIMES DAILY Dx E11.9 200 each 3   Alcohol Swabs (DROPSAFE ALCOHOL PREP) 70 % PADS TEST BLOOD SUGAR TWO TIMES DAILY Dx E11.9 200 each 3   ALPRAZolam  (XANAX ) 0.25 MG tablet Take 1 tablet (0.25 mg total) by mouth at bedtime. 30 tablet 5   amLODipine  (NORVASC ) 5 MG tablet Take 1 tablet (5 mg total) by mouth daily. 90 tablet 1   apixaban  (ELIQUIS ) 5 MG TABS tablet Take 1 tablet (5 mg total) by mouth 2 (two) times daily. 180 tablet 1   ascorbic acid (VITAMIN C) 500 MG tablet Take 500 mg by mouth at bedtime.     Cholecalciferol (VITAMIN D-3 PO) Take 1 capsule by mouth at bedtime.     CINNAMON PO Take 1 capsule by mouth daily.     cloNIDine  (CATAPRES ) 0.1 MG tablet Take 1 tablet (0.1 mg total) by mouth 2 (two) times daily. 180 tablet 1   ferrous sulfate 325 (65 FE) MG tablet Take 325 mg by mouth daily with breakfast.     furosemide  (LASIX ) 20 MG tablet Take 1 tablet (20 mg total) by mouth daily. 90 tablet 1   glipiZIDE  (GLUCOTROL ) 5 MG tablet Take 1 tablet (5 mg total) by mouth 2  (two) times daily before a meal. 90 tablet 1   glucose blood (ACCU-CHEK AVIVA PLUS) test strip TEST BLOOD SUGAR TWO TIMES DAILY Dx E11.9 200 strip 3   lisinopril  (ZESTRIL ) 20 MG tablet Take 1 tablet (20 mg total) by mouth daily. 90 tablet 1   metFORMIN  (GLUCOPHAGE -XR) 500 MG 24 hr tablet TAKE 2 TABLETS (1,000 MG TOTAL) BY MOUTH DAILY WITH BREAKFAST. 180 tablet 1   metoprolol  tartrate (LOPRESSOR ) 50 MG tablet Take 1 tablet (50 mg total) by mouth 2 (two) times daily. 180 tablet 1   nystatin  (KLAYESTA ) powder APPLY 1 APPLICATION TOPICALLY 3 (THREE) TIMES DAILY. 60 g 0   Omega-3 Fatty Acids (FISH OIL PO) Take 1 capsule by mouth daily.     pantoprazole  (PROTONIX ) 40 MG tablet Take 1 tablet (40 mg total) by mouth 2 (two) times daily before a meal. 180 tablet 1   polyethylene glycol (MIRALAX  / GLYCOLAX ) 17 g packet Take 17 g by mouth daily as needed for mild constipation  or moderate constipation. 30 packet 1   simvastatin  (ZOCOR ) 40 MG tablet Take 1 tablet (40 mg total) by mouth every evening. 90 tablet 1   tolterodine  (DETROL  LA) 2 MG 24 hr capsule Take 1 capsule (2 mg total) by mouth daily. 90 capsule 1   Turmeric (QC TUMERIC COMPLEX PO) Take 1 capsule by mouth daily.     zinc gluconate 50 MG tablet Take 50 mg by mouth at bedtime.     No current facility-administered medications for this visit.    Review of Systems: GENERAL: negative for malaise, night sweats HEENT: No changes in hearing or vision, no nose bleeds or other nasal problems. NECK: Negative for lumps, goiter, pain and significant neck swelling RESPIRATORY: Negative for cough, wheezing CARDIOVASCULAR: Negative for chest pain, leg swelling, palpitations, orthopnea GI: SEE HPI MUSCULOSKELETAL: Negative for joint pain or swelling, back pain, and muscle pain. SKIN: Negative for lesions, rash HEMATOLOGY Negative for prolonged bleeding, bruising easily, and swollen nodes. ENDOCRINE: Negative for cold or heat intolerance, polyuria,  polydipsia and goiter. NEURO: negative for tremor, gait imbalance, syncope and seizures. The remainder of the review of systems is noncontributory.   Physical Exam: BP 124/62   Pulse 64   Temp (!) 97.2 F (36.2 C)   Ht 5' 2 (1.575 m)   Wt 268 lb 4.8 oz (121.7 kg)   BMI 49.07 kg/m  GENERAL: The patient is AO x3, in no acute distress. HEENT: Head is normocephalic and atraumatic. EOMI are intact. Mouth is well hydrated and without lesions. NECK: Supple. No masses LUNGS: Clear to auscultation. No presence of rhonchi/wheezing/rales. Adequate chest expansion HEART: RRR, normal s1 and s2. ABDOMEN: Soft, nontender, no guarding, no peritoneal signs, and nondistended. BS +. No masses.  Imaging/Labs: as above  01/2023  IMPRESSION: 1. No acute findings. 2. Bilateral nephrolithiasis. 3. 5 cm right adnexal cyst. Recommend follow-up US  in 6-12 months. Note: This recommendation does not apply to premenarchal patients and to those with increased risk (genetic, family history, elevated tumor markers or other high-risk factors) of ovarian cancer. Reference: JACR 2020 Feb; 17(2):248-254 4. Mildly nodular hepatic contour, suggesting cirrhosis. 5.  Aortic Atherosclerosis (ICD10-I70.0).      Latest Ref Rng & Units 10/31/2023   11:22 AM 08/22/2023   12:19 PM 07/31/2023    1:56 PM  CBC  WBC 3.4 - 10.8 x10E3/uL 7.8  8.7  6.7   Hemoglobin 11.1 - 15.9 g/dL 86.7  86.1  86.6   Hematocrit 34.0 - 46.6 % 40.7  43.1  43.2   Platelets 150 - 450 x10E3/uL 235  233  235    Lab Results  Component Value Date   IRON 33 04/26/2023   TIBC 467 (H) 04/26/2023   FERRITIN 10 (L) 04/26/2023    I personally reviewed and interpreted the available labs, imaging and endoscopic files.  Percent saturation 7% ferritin 10 INR 1.1 Normal liver enzymes Negative AMA, ASMA Hep A nonimmune Hep B nonimmune nonexposed Hep C negative HIV negative  Ultrasound for  HCC screening in setting of cirrhosis 07/2023    IMPRESSION: Nodular heterogeneous echogenic liver consistent with known chronic liver disease. No biliary ductal dilatation. Gallbladder sludge.   No splenomegaly.   Impression and Plan: This is a 73 y.o. female with diabetes hypertension A-fib on Eliquis , AV stenosis , history of PUD with GI bleed (01/2023)  Forrest IIC duodenal ulcer is here for follow-up on cirrhosis, iron deficiency and hemorrhagic gastropathy  #History of PUD with GI Bleed  Patient  underwent upper endoscopy 02/03/2023 found to have Forrest 2C duodenal bulb ulcer status post epi injection. Patient had an outpatient surgery with upper endoscopy to evaluate healing of the ulcer but incidentally was found to have bleeding gastropathy either dieulafoy/angioectasia which required endoscopy hemostasis (clipping, bipolar and PURASTAT)    -Avoid all NSAIDs and take Tylenol  only   De-escalate PPI to once daily now  I discussed with patient regarding indication risks limited benefit of surveillance endoscopy to ensure healing of this lesion which patient refused at this time again and will think about next year with  #Compensated Cirrhosis  MELD 3.0: 8 at 04/26/2023 12:01 PM MELD-Na: 7 at 04/26/2023 12:01 PM Calculated from: Serum Creatinine: 0.68 mg/dL (Using min of 1 mg/dL) at 6/80/7974 87:98 PM Serum Sodium: 142 mmol/L (Using max of 137 mmol/L) at 04/26/2023 12:01 PM Total Bilirubin: 0.3 mg/dL (Using min of 1 mg/dL) at 6/80/7974 87:98 PM Serum Albumin: 4 g/dL (Using max of 3.5 g/dL) at 6/80/7974 87:98 PM INR(ratio): 1.1 at 04/26/2023 12:01 PM Age at listing (hypothetical): 68 years Sex: Female at 04/26/2023 12:01 PM   CT demonstrating nodular liver.  #Eitology : Likely MASLD with BMI of 49 and metabolic syndrome with diabetes and hypertension  # Hepatic encephalopathy None - Avoid opiates or benzodiazepines   # Ascites; none on imaging  - Low sodium diet  # Esophageal varices - Last EGD 07/2023, none   # HCC  screening - Last abdominal imaging; 06 /2025, next ultrasound in 5 months  Recommendation   - Check CBC, MELD labs and AFP  - Schedule liver US   - Reduce salt intake to <2 g per day - Can take Tylenol  max of 2 g per day (650 mg q8h) for pain - Avoid NSAIDs for pain - Avoid eating raw oysters/shellfish - Patient was vaccinated against hepatitis A - Importance of weight loss discussed with patient  #Iron deficiency Anemia  Percent saturation 7% ferritin 10 Recent blood work hemoglobin 13.3 Patient could have had iron deficiency anemia in setting of peptic ulcer disease which has healed since and hemoglobin have uptrended  I discussed with patient regarding indication for colonoscopy for workup of iron deficiency.  Patient refused at this time and rather had performed Cologuard test which was negative.  I again reiterated to patient Cologuard as a screening test and would not suffice a diagnostic colonoscopy for workup of iron deficiency  Will repeat iron profile and will rediscuss colonoscopy  #Right adnexal cyst  Last CT with 5 cm right adnexal cyst.  Patient to follow-up with PCP for surveillance and further management of the cyst  All questions were answered.      Ashley [name] Ashley Royale Swamy, MD Gastroenterology and Hepatology [facility]   This chart has been completed using [facility] Dictation software, and while attempts have been made to ensure accuracy , certain words and phrases may not be transcribed as intended

## 2024-01-26 ENCOUNTER — Other Ambulatory Visit

## 2024-01-26 LAB — OPHTHALMOLOGY REPORT-SCANNED

## 2024-01-27 LAB — COMPREHENSIVE METABOLIC PANEL WITH GFR
ALT: 18 IU/L (ref 0–32)
AST: 27 IU/L (ref 0–40)
Albumin: 3.7 g/dL — ABNORMAL LOW (ref 3.8–4.8)
Alkaline Phosphatase: 55 IU/L (ref 49–135)
BUN/Creatinine Ratio: 31 — ABNORMAL HIGH (ref 12–28)
BUN: 21 mg/dL (ref 8–27)
Bilirubin Total: 0.6 mg/dL (ref 0.0–1.2)
CO2: 20 mmol/L (ref 20–29)
Calcium: 9.3 mg/dL (ref 8.7–10.3)
Chloride: 103 mmol/L (ref 96–106)
Creatinine, Ser: 0.68 mg/dL (ref 0.57–1.00)
Globulin, Total: 2.6 g/dL (ref 1.5–4.5)
Glucose: 226 mg/dL — ABNORMAL HIGH (ref 70–99)
Potassium: 4.3 mmol/L (ref 3.5–5.2)
Sodium: 141 mmol/L (ref 134–144)
Total Protein: 6.3 g/dL (ref 6.0–8.5)
eGFR: 93 mL/min/1.73

## 2024-01-27 LAB — IRON,TIBC AND FERRITIN PANEL
Ferritin: 39 ng/mL (ref 15–150)
Iron Saturation: 20 % (ref 15–55)
Iron: 70 ug/dL (ref 27–139)
Total Iron Binding Capacity: 347 ug/dL (ref 250–450)
UIBC: 277 ug/dL (ref 118–369)

## 2024-01-27 LAB — VITAMIN B12: Vitamin B-12: 252 pg/mL (ref 232–1245)

## 2024-01-27 LAB — AFP TUMOR MARKER: AFP, Serum, Tumor Marker: 3.2 ng/mL (ref 0.0–9.2)

## 2024-01-27 LAB — PROTIME-INR
INR: 1 (ref 0.9–1.2)
Prothrombin Time: 11.3 s (ref 9.1–12.0)

## 2024-01-27 LAB — FOLATE: Folate: 18.4 ng/mL

## 2024-01-29 ENCOUNTER — Encounter: Payer: Self-pay | Admitting: Nurse Practitioner

## 2024-01-29 ENCOUNTER — Ambulatory Visit (INDEPENDENT_AMBULATORY_CARE_PROVIDER_SITE_OTHER): Payer: Self-pay | Admitting: Nurse Practitioner

## 2024-01-29 VITALS — BP 132/80 | HR 63 | Temp 98.1°F | Ht 62.0 in | Wt 268.0 lb

## 2024-01-29 DIAGNOSIS — I4819 Other persistent atrial fibrillation: Secondary | ICD-10-CM

## 2024-01-29 DIAGNOSIS — E782 Mixed hyperlipidemia: Secondary | ICD-10-CM

## 2024-01-29 DIAGNOSIS — E1159 Type 2 diabetes mellitus with other circulatory complications: Secondary | ICD-10-CM

## 2024-01-29 DIAGNOSIS — Z7984 Long term (current) use of oral hypoglycemic drugs: Secondary | ICD-10-CM

## 2024-01-29 DIAGNOSIS — R600 Localized edema: Secondary | ICD-10-CM

## 2024-01-29 DIAGNOSIS — I152 Hypertension secondary to endocrine disorders: Secondary | ICD-10-CM

## 2024-01-29 DIAGNOSIS — F411 Generalized anxiety disorder: Secondary | ICD-10-CM

## 2024-01-29 DIAGNOSIS — D500 Iron deficiency anemia secondary to blood loss (chronic): Secondary | ICD-10-CM

## 2024-01-29 DIAGNOSIS — E119 Type 2 diabetes mellitus without complications: Secondary | ICD-10-CM

## 2024-01-29 LAB — CBC WITH DIFFERENTIAL/PLATELET
Basophils Absolute: 0 x10E3/uL (ref 0.0–0.2)
Basos: 1 %
EOS (ABSOLUTE): 0.1 x10E3/uL (ref 0.0–0.4)
Eos: 1 %
Hematocrit: 40.6 % (ref 34.0–46.6)
Hemoglobin: 13.1 g/dL (ref 11.1–15.9)
Immature Grans (Abs): 0 x10E3/uL (ref 0.0–0.1)
Immature Granulocytes: 0 %
Lymphocytes Absolute: 1.5 x10E3/uL (ref 0.7–3.1)
Lymphs: 22 %
MCH: 31.8 pg (ref 26.6–33.0)
MCHC: 32.3 g/dL (ref 31.5–35.7)
MCV: 99 fL — ABNORMAL HIGH (ref 79–97)
Monocytes Absolute: 0.5 x10E3/uL (ref 0.1–0.9)
Monocytes: 7 %
Neutrophils Absolute: 4.5 x10E3/uL (ref 1.4–7.0)
Neutrophils: 68 %
Platelets: 215 x10E3/uL (ref 150–450)
RBC: 4.12 x10E6/uL (ref 3.77–5.28)
RDW: 13.7 % (ref 11.7–15.4)
WBC: 6.6 x10E3/uL (ref 3.4–10.8)

## 2024-01-29 LAB — LIPID PANEL
Chol/HDL Ratio: 3.4 ratio (ref 0.0–4.4)
Cholesterol, Total: 134 mg/dL (ref 100–199)
HDL: 39 mg/dL — ABNORMAL LOW
LDL Chol Calc (NIH): 63 mg/dL (ref 0–99)
Triglycerides: 195 mg/dL — ABNORMAL HIGH (ref 0–149)
VLDL Cholesterol Cal: 32 mg/dL (ref 5–40)

## 2024-01-29 LAB — BAYER DCA HB A1C WAIVED: HB A1C (BAYER DCA - WAIVED): 7.9 % — ABNORMAL HIGH (ref 4.8–5.6)

## 2024-01-29 MED ORDER — PANTOPRAZOLE SODIUM 40 MG PO TBEC: 40.0000 mg | DELAYED_RELEASE_TABLET | Freq: Two times a day (BID) | ORAL | 1 refills | Status: AC

## 2024-01-29 MED ORDER — FUROSEMIDE 20 MG PO TABS: 20.0000 mg | ORAL_TABLET | Freq: Every day | ORAL | 1 refills | Status: AC

## 2024-01-29 MED ORDER — LISINOPRIL 20 MG PO TABS: 20.0000 mg | ORAL_TABLET | Freq: Every day | ORAL | 1 refills | Status: AC

## 2024-01-29 MED ORDER — TOLTERODINE TARTRATE ER 2 MG PO CP24: 2.0000 mg | ORAL_CAPSULE | Freq: Every day | ORAL | 1 refills | Status: AC

## 2024-01-29 MED ORDER — METOPROLOL TARTRATE 50 MG PO TABS: 50.0000 mg | ORAL_TABLET | Freq: Two times a day (BID) | ORAL | 1 refills | Status: AC

## 2024-01-29 MED ORDER — METFORMIN HCL ER 500 MG PO TB24: 1000.0000 mg | ORAL_TABLET | Freq: Every day | ORAL | 1 refills | Status: AC

## 2024-01-29 MED ORDER — GLIPIZIDE 10 MG PO TABS: 10.0000 mg | ORAL_TABLET | Freq: Two times a day (BID) | ORAL | 1 refills | Status: AC

## 2024-01-29 MED ORDER — AMLODIPINE BESYLATE 5 MG PO TABS: 5.0000 mg | ORAL_TABLET | Freq: Every day | ORAL | 1 refills | Status: AC

## 2024-01-29 MED ORDER — ALPRAZOLAM 0.25 MG PO TABS: 0.2500 mg | ORAL_TABLET | Freq: Every day | ORAL | 5 refills | Status: AC

## 2024-01-29 MED ORDER — APIXABAN 5 MG PO TABS: 5.0000 mg | ORAL_TABLET | Freq: Two times a day (BID) | ORAL | 1 refills | Status: AC

## 2024-01-29 MED ORDER — SIMVASTATIN 40 MG PO TABS: 40.0000 mg | ORAL_TABLET | Freq: Every evening | ORAL | 1 refills | Status: AC

## 2024-01-29 NOTE — Patient Instructions (Signed)

## 2024-01-29 NOTE — Progress Notes (Addendum)
 "  Subjective:    Patient ID: Ashley [name], female    DOB: 1950/09/23, 73 y.o.   MRN: [id number]   Chief Complaint: medical management of chronic issues      HPI:  Ashley [name] is a 73 y.o. who identifies as a female who was assigned female at birth.   Social history: Lives with: her granddaughter lives with her Work history: drives a school bus   Comes in today for follow up of the following chronic medical issues:  1. Primary hypertension No c/o chest pain, sob or headache. Does  check blood pressure at home. Has been running 140's systolic but takes her blood pressure prior to taking meds. BP Readings from Last 3 Encounters:  01/24/24 124/62  10/31/23 (!) 151/69  09/15/23 (!) 138/55     2. Mixed hyperlipidemia Does not really watch diet and does no dedicated exercise. Lab Results  Component Value Date   CHOL 144 10/31/2023   HDL 44 10/31/2023   LDLCALC 71 10/31/2023   TRIG 170 (H) 10/31/2023   CHOLHDL 3.3 10/31/2023     3. Diabetes mellitus treated with oral medication (HCC) Blood sugars are running around 150's-170's. She has been on metformin  XR 1000mg  in morning. She has added her old metformin  1000mg  at night. But blood sugars are still running high. Lab Results  Component Value Date   HGBA1C 8.6 (H) 10/31/2023     4. Persistent atrial fibrillation (HCC) Is on eliquis  daily- no bleeding issues  5. Peripheral edema Has edema at the end of each day- will resolve at night  6. Iron deficiency anemia Lab Results  Component Value Date   HGB 13.2 10/31/2023   7. Anxiety Has seem to have gotten worse through the years. Takes xanax  every day.    10/31/2023   11:15 AM 08/22/2023   11:53 AM 04/03/2023   11:10 AM 12/16/2022    2:43 PM  GAD 7 : Generalized Anxiety Score  Nervous, Anxious, on Edge 3 3 2  0  Control/stop worrying 3 3 2  0  Worry too much - different things 3 2 2  0  Trouble relaxing 3 3 0 0  Restless 1 0 0 0  Easily annoyed or  irritable 2 0 0 0  Afraid - awful might happen 3 3 2  0  Total GAD 7 Score 18 14 8  0  Anxiety Difficulty Somewhat difficult Somewhat difficult Somewhat difficult Not difficult at all      8. Severe obesity (BMI >= 40) (HCC) No recent weight changes Wt Readings from Last 3 Encounters:  01/24/24 268 lb 4.8 oz (121.7 kg)  10/31/23 262 lb (118.8 kg)  09/15/23 267 lb (121.1 kg)   BMI Readings from Last 3 Encounters:  01/24/24 49.07 kg/m  10/31/23 47.92 kg/m  09/15/23 48.83 kg/m      New complaints: Back pain- is currently seeing specialist- they want her to have MRI prior to getting steroid shot sin back.   Allergies  Allergen Reactions   Iodine Dermatitis   Cipro  [Ciprofloxacin  Hcl] Hives   Outpatient Encounter Medications as of 01/29/2024  Medication Sig   Accu-Chek Softclix Lancets lancets TEST BLOOD SUGAR TWO TIMES DAILY Dx E11.9   Alcohol Swabs (DROPSAFE ALCOHOL PREP) 70 % PADS TEST BLOOD SUGAR TWO TIMES DAILY Dx E11.9   ALPRAZolam  (XANAX ) 0.25 MG tablet Take 1 tablet (0.25 mg total) by mouth at bedtime.   amLODipine  (NORVASC ) 5 MG tablet Take 1 tablet (5 mg total) by mouth daily.  apixaban  (ELIQUIS ) 5 MG TABS tablet Take 1 tablet (5 mg total) by mouth 2 (two) times daily.   ascorbic acid (VITAMIN C) 500 MG tablet Take 500 mg by mouth at bedtime.   Cholecalciferol (VITAMIN D-3 PO) Take 1 capsule by mouth at bedtime.   CINNAMON PO Take 1 capsule by mouth daily.   cloNIDine  (CATAPRES ) 0.1 MG tablet Take 1 tablet (0.1 mg total) by mouth 2 (two) times daily.   ferrous sulfate 325 (65 FE) MG tablet Take 325 mg by mouth daily with breakfast.   furosemide  (LASIX ) 20 MG tablet Take 1 tablet (20 mg total) by mouth daily.   glipiZIDE  (GLUCOTROL ) 5 MG tablet Take 1 tablet (5 mg total) by mouth 2 (two) times daily before a meal.   glucose blood (ACCU-CHEK AVIVA PLUS) test strip TEST BLOOD SUGAR TWO TIMES DAILY Dx E11.9   lisinopril  (ZESTRIL ) 20 MG tablet Take 1 tablet (20 mg  total) by mouth daily.   metFORMIN  (GLUCOPHAGE -XR) 500 MG 24 hr tablet TAKE 2 TABLETS (1,000 MG TOTAL) BY MOUTH DAILY WITH BREAKFAST.   metoprolol  tartrate (LOPRESSOR ) 50 MG tablet Take 1 tablet (50 mg total) by mouth 2 (two) times daily.   nystatin  (KLAYESTA ) powder APPLY 1 APPLICATION TOPICALLY 3 (THREE) TIMES DAILY.   Omega-3 Fatty Acids (FISH OIL PO) Take 1 capsule by mouth daily.   pantoprazole  (PROTONIX ) 40 MG tablet Take 1 tablet (40 mg total) by mouth 2 (two) times daily before a meal.   polyethylene glycol (MIRALAX  / GLYCOLAX ) 17 g packet Take 17 g by mouth daily as needed for mild constipation or moderate constipation.   simvastatin  (ZOCOR ) 40 MG tablet Take 1 tablet (40 mg total) by mouth every evening.   tolterodine  (DETROL  LA) 2 MG 24 hr capsule Take 1 capsule (2 mg total) by mouth daily.   Turmeric (QC TUMERIC COMPLEX PO) Take 1 capsule by mouth daily.   zinc gluconate 50 MG tablet Take 50 mg by mouth at bedtime.   No facility-administered encounter medications on file as of 01/29/2024.    Past Surgical History:  Procedure Laterality Date   BIOPSY  02/03/2023   Procedure: BIOPSY;  Surgeon: Cinderella Deatrice FALCON, MD;  [facility];  Service: Endoscopy;;   CYST EXCISION Left 03/15/2017   from buttocks   ESOPHAGOGASTRODUODENOSCOPY N/A 07/27/2023   Procedure: EGD (ESOPHAGOGASTRODUODENOSCOPY);  Surgeon: Cinderella Deatrice FALCON, MD;  [facility];  Service: Endoscopy;  Laterality: N/A;  730AM, ASA 3   ESOPHAGOGASTRODUODENOSCOPY (EGD) WITH PROPOFOL  N/A 02/03/2023   Procedure: ESOPHAGOGASTRODUODENOSCOPY (EGD) WITH PROPOFOL ;  Surgeon: Cinderella Deatrice FALCON, MD;  [facility];  Service: Endoscopy;  Laterality: N/A;   ORIF ANKLE FRACTURE Left 04/20/2017   Procedure: OPEN REDUCTION INTERNAL FIXATION (ORIF) LEFT ANKLE FRACTURE;  Surgeon: Kay Kemps, MD;  Location: [facility];  Service: Orthopedics;  Laterality: Left;   SCLEROTHERAPY  02/03/2023   Procedure: SCLEROTHERAPY;   Surgeon: Cinderella Deatrice FALCON, MD;  [facility];  Service: Endoscopy;;    Family History  Problem Relation Age of Onset   Diabetes Mother    CAD Mother        CABG at age 75   Hypertension Mother    Hyperlipidemia Mother    Diabetes Father    Heart disease Father        CHF   Hypertension Father    Diabetes Sister    CAD Sister    Hypertension Sister    Diabetes Brother  Hypertension Brother    Healthy Sister       Controlled substance contract: n/a     Review of Systems  Constitutional:  Negative for diaphoresis.  Eyes:  Negative for pain.  Respiratory:  Negative for shortness of breath.   Cardiovascular:  Negative for chest pain, palpitations and leg swelling.  Gastrointestinal:  Negative for abdominal pain.  Endocrine: Negative for polydipsia.  Skin:  Negative for rash.  Neurological:  Negative for dizziness, weakness and headaches.  Hematological:  Does not bruise/bleed easily.  All other systems reviewed and are negative.      Objective:   Physical Exam Vitals and nursing note reviewed.  Constitutional:      General: She is not in acute distress.    Appearance: Normal appearance. She is well-developed.  HENT:     Head: Normocephalic.     Right Ear: Tympanic membrane normal.     Left Ear: Tympanic membrane normal.     Nose: Nose normal.     Mouth/Throat:     Mouth: Mucous membranes are moist.  Eyes:     Pupils: Pupils are equal, round, and reactive to light.  Neck:     Vascular: No carotid bruit or JVD.  Cardiovascular:     Rate and Rhythm: Normal rate and regular rhythm.     Heart sounds: Normal heart sounds.  Pulmonary:     Effort: Pulmonary effort is normal. No respiratory distress.     Breath sounds: Normal breath sounds. No wheezing or rales.  Chest:     Chest wall: No tenderness.  Abdominal:     General: Bowel sounds are normal. There is no distension or abdominal bruit.     Palpations: Abdomen is soft. There is no  hepatomegaly, splenomegaly, mass or pulsatile mass.     Tenderness: There is no abdominal tenderness.  Musculoskeletal:        General: Normal range of motion.     Cervical back: Normal range of motion and neck supple.     Right lower leg: Edema (1+) present.     Left lower leg: Edema (1+) present.     Comments: Rises slowly from sitting to standing Pain with weight bearing (-) SLR bil  Lymphadenopathy:     Cervical: No cervical adenopathy.  Skin:    General: Skin is warm and dry.  Neurological:     Mental Status: She is alert and oriented to person, place, and time.     Deep Tendon Reflexes: Reflexes are normal and symmetric.  Psychiatric:        Behavior: Behavior normal.        Thought Content: Thought content normal.        Judgment: Judgment normal.    BP 132/80   Pulse 63   Temp 98.1 F (36.7 C) (Temporal)   Ht 5' 2 (1.575 m)   Wt 268 lb (121.6 kg)   SpO2 98%   BMI 49.02 kg/m   HGBa1c 7.9%     Assessment & Plan:   Ashley [name] comes in today with chief complaint of Medical Management of Chronic Issues   Diagnosis and orders addressed:  1. Hypertension associated with diabetes (HCC) (Primary) Low sodium diet - CBC with Differential/Platelet - amLODipine  (NORVASC ) 5 MG tablet; Take 1 tablet (5 mg total) by mouth daily.  Dispense: 90 tablet; Refill: 1 - lisinopril  (ZESTRIL ) 20 MG tablet; Take 1 tablet (20 mg total) by mouth daily.  Dispense: 90 tablet; Refill: 1  2. Mixed  hyperlipidemia Low fat diet - Lipid panel - simvastatin  (ZOCOR ) 40 MG tablet; Take 1 tablet (40 mg total) by mouth every evening.  Dispense: 90 tablet; Refill: 1  3. Diabetes mellitus treated with oral medication (HCC) Stricter carb counting Increase glipizide  to 10mg  BID- watch for low blood sugars - Bayer DCA Hb A1c Waived - glipiZIDE  (GLUCOTROL ) 10 MG tablet; Take 1 tablet (10 mg total) by mouth 2 (two) times daily before a meal.  Dispense: 180 tablet; Refill: 1  4. Persistent  atrial fibrillation (HCC) Avoid caffeine - apixaban  (ELIQUIS ) 5 MG TABS tablet; Take 1 tablet (5 mg total) by mouth 2 (two) times daily.  Dispense: 180 tablet; Refill: 1 - metoprolol  tartrate (LOPRESSOR ) 50 MG tablet; Take 1 tablet (50 mg total) by mouth 2 (two) times daily.  Dispense: 180 tablet; Refill: 1  5. Mild peripheral edema Elevate legs when sitting - furosemide  (LASIX ) 20 MG tablet; Take 1 tablet (20 mg total) by mouth daily.  Dispense: 90 tablet; Refill: 1  6. Iron deficiency anemia due to chronic blood loss Labs pending  7. GAD (generalized anxiety disorder) Stress management - ALPRAZolam  (XANAX ) 0.25 MG tablet; Take 1 tablet (0.25 mg total) by mouth at bedtime.  Dispense: 30 tablet; Refill: 5  8. Severe obesity (BMI >= 40) (HCC) Discussed diet and exercise for person with BMI >25 Will recheck weight in 3-6 months    Labs pending Health Maintenance reviewed Diet and exercise encouraged  Follow up plan: 3 months   Mary-Margaret Gladis, FNP   "

## 2024-01-30 ENCOUNTER — Ambulatory Visit: Payer: Self-pay | Admitting: Nurse Practitioner

## 2024-02-02 ENCOUNTER — Ambulatory Visit (INDEPENDENT_AMBULATORY_CARE_PROVIDER_SITE_OTHER): Payer: Self-pay | Admitting: Gastroenterology

## 2024-02-02 MED ORDER — VITAMIN B-12 100 MCG PO TABS: 100.0000 ug | ORAL_TABLET | Freq: Every day | ORAL | 1 refills | Status: AC

## 2024-02-02 NOTE — Progress Notes (Signed)
 Hi Tanya ,  Can you please call the patient and tell the patient the lab work shows the iron deficiency have significantly improved.  Although it shows vitamin B12 deficiency and iron sending in vitamin B12 supplementation.  Otherwise lab work looks good  Thanks,  Elick Aguilera Faizan Jakaya Jacobowitz, MD Gastroenterology and Hepatology [facility] Gastroenterology ==============  Labs  Improved iron deficiency:  Ferritin : 10--->39  %sat : 7--->10% Folate 18.4 Vitamin B12 252 INR 1.0 AFP 3.2 Normal liver enzymes and kidney function  Started vitamin B12 supplementation

## 2024-02-05 ENCOUNTER — Ambulatory Visit (HOSPITAL_COMMUNITY)
Admission: RE | Admit: 2024-02-05 | Discharge: 2024-02-05 | Disposition: A | Discharge status: Home / Self Care | Source: Ambulatory Visit | Attending: Gastroenterology | Admitting: Gastroenterology

## 2024-02-05 DIAGNOSIS — K7460 Unspecified cirrhosis of liver: Secondary | ICD-10-CM | POA: Insufficient documentation

## 2024-02-07 ENCOUNTER — Other Ambulatory Visit: Payer: Self-pay | Admitting: Nurse Practitioner

## 2024-02-09 NOTE — Progress Notes (Signed)
 8 cc screening ultrasound performed 02/2024  IMPRESSION: 1. Cirrhotic changes of the liver. No focal hepatic abnormality by ultrasound. 2. Trace gallbladder sludge.

## 2024-02-29 ENCOUNTER — Ambulatory Visit (INDEPENDENT_AMBULATORY_CARE_PROVIDER_SITE_OTHER)

## 2024-02-29 ENCOUNTER — Ambulatory Visit: Admitting: Nurse Practitioner

## 2024-02-29 ENCOUNTER — Other Ambulatory Visit: Payer: Self-pay | Admitting: Nurse Practitioner

## 2024-02-29 ENCOUNTER — Ambulatory Visit: Payer: Self-pay | Admitting: Nurse Practitioner

## 2024-02-29 ENCOUNTER — Encounter: Payer: Self-pay | Admitting: Nurse Practitioner

## 2024-02-29 VITALS — BP 124/65 | HR 59 | Temp 97.4°F | Ht 62.0 in | Wt 269.0 lb

## 2024-02-29 DIAGNOSIS — Z7984 Long term (current) use of oral hypoglycemic drugs: Secondary | ICD-10-CM

## 2024-02-29 DIAGNOSIS — Z780 Asymptomatic menopausal state: Secondary | ICD-10-CM

## 2024-02-29 DIAGNOSIS — E1169 Type 2 diabetes mellitus with other specified complication: Secondary | ICD-10-CM

## 2024-02-29 DIAGNOSIS — Z13820 Encounter for screening for osteoporosis: Secondary | ICD-10-CM

## 2024-02-29 DIAGNOSIS — Z79899 Other long term (current) drug therapy: Secondary | ICD-10-CM

## 2024-02-29 NOTE — Patient Instructions (Signed)

## 2024-02-29 NOTE — Progress Notes (Signed)
 "  Subjective:    Patient ID: Ashley [name], female    DOB: 11/15/1950, 74 y.o.   MRN: [id number]  HPI  Patient comes in today with 2 issues: - patient is having injections in her back next week and wants to know what to do with her eliquis . - she says her blood sugars have been running between 150-200. No readings above 200. She tries to watch her diet. Is currently on glipizide  10mg  BID and metformin  500 2 in mornings.   Review of Systems  Constitutional:  Negative for diaphoresis.  Eyes:  Negative for pain.  Respiratory:  Negative for shortness of breath.   Cardiovascular:  Negative for chest pain, palpitations and leg swelling.  Gastrointestinal:  Negative for abdominal pain.  Endocrine: Negative for polydipsia.  Skin:  Negative for rash.  Neurological:  Negative for dizziness, weakness and headaches.  Hematological:  Does not bruise/bleed easily.  All other systems reviewed and are negative.      Objective:   Physical Exam Vitals and nursing note reviewed.  Constitutional:      General: She is not in acute distress.    Appearance: Normal appearance. She is well-developed.  Neck:     Vascular: No carotid bruit or JVD.  Cardiovascular:     Rate and Rhythm: Normal rate and regular rhythm.     Heart sounds: Normal heart sounds.  Pulmonary:     Effort: Pulmonary effort is normal. No respiratory distress.     Breath sounds: Normal breath sounds. No wheezing or rales.  Chest:     Chest wall: No tenderness.  Abdominal:     General: Bowel sounds are normal. There is no distension or abdominal bruit.     Palpations: Abdomen is soft. There is no hepatomegaly, splenomegaly, mass or pulsatile mass.     Tenderness: There is no abdominal tenderness.  Musculoskeletal:        General: Normal range of motion.     Cervical back: Normal range of motion and neck supple.  Lymphadenopathy:     Cervical: No cervical adenopathy.  Skin:    General: Skin is warm and dry.   Neurological:     Mental Status: She is alert and oriented to person, place, and time.     Deep Tendon Reflexes: Reflexes are normal and symmetric.  Psychiatric:        Behavior: Behavior normal.        Thought Content: Thought content normal.        Judgment: Judgment normal.    BP 124/65   Pulse (!) 59   Temp (!) 97.4 F (36.3 C) (Temporal)   Ht 5' 2 (1.575 m)   Wt 269 lb (122 kg)   SpO2 96%   BMI 49.20 kg/m         Assessment & Plan:   Nature Kueker in today with chief complaint of Discuss back pain   1. High risk medication use (Primary) Stop eliquis  3 days prior to procedure and start back on it the next day  2. Diabetes mellitus treated with oral medication (HCC) Continue glipizide  10mg  bid and metformin  500 2 in am- may want to try splitting metfrmin to 500 BID. Continue to check carbs daily Keep follow up appointment.    The above assessment and management plan was discussed with the patient. The patient verbalized understanding of and has agreed to the management plan. Patient is aware to call the clinic if symptoms persist or worsen. Patient is  aware when to return to the clinic for a follow-up visit. Patient educated on when it is appropriate to go to the emergency department.   Mary-Margaret Gladis, FNP     "

## 2024-03-25 ENCOUNTER — Ambulatory Visit: Admitting: Nurse Practitioner

## 2024-07-26 ENCOUNTER — Ambulatory Visit: Payer: Self-pay
